# Patient Record
Sex: Female | Born: 1945
Health system: Southern US, Community
[De-identification: ages and names within clinical notes are randomized; demographics above are authoritative.]

## PROBLEM LIST (undated history)

## (undated) DIAGNOSIS — E119 Type 2 diabetes mellitus without complications: Secondary | ICD-10-CM

## (undated) DIAGNOSIS — K219 Gastro-esophageal reflux disease without esophagitis: Secondary | ICD-10-CM

## (undated) DIAGNOSIS — I10 Essential (primary) hypertension: Secondary | ICD-10-CM

## (undated) DIAGNOSIS — I5189 Other ill-defined heart diseases: Secondary | ICD-10-CM

## (undated) DIAGNOSIS — M1990 Unspecified osteoarthritis, unspecified site: Secondary | ICD-10-CM

## (undated) DIAGNOSIS — T753XXA Motion sickness, initial encounter: Secondary | ICD-10-CM

## (undated) DIAGNOSIS — R600 Localized edema: Secondary | ICD-10-CM

## (undated) DIAGNOSIS — IMO0001 Reserved for inherently not codable concepts without codable children: Secondary | ICD-10-CM

## (undated) DIAGNOSIS — R42 Dizziness and giddiness: Secondary | ICD-10-CM

## (undated) DIAGNOSIS — I2510 Atherosclerotic heart disease of native coronary artery without angina pectoris: Secondary | ICD-10-CM

## (undated) DIAGNOSIS — J45909 Unspecified asthma, uncomplicated: Secondary | ICD-10-CM

## (undated) DIAGNOSIS — F329 Major depressive disorder, single episode, unspecified: Secondary | ICD-10-CM

## (undated) DIAGNOSIS — J3089 Other allergic rhinitis: Secondary | ICD-10-CM

## (undated) DIAGNOSIS — E785 Hyperlipidemia, unspecified: Secondary | ICD-10-CM

## (undated) DIAGNOSIS — F419 Anxiety disorder, unspecified: Secondary | ICD-10-CM

## (undated) DIAGNOSIS — M419 Scoliosis, unspecified: Secondary | ICD-10-CM

## (undated) DIAGNOSIS — K559 Vascular disorder of intestine, unspecified: Secondary | ICD-10-CM

## (undated) DIAGNOSIS — E039 Hypothyroidism, unspecified: Secondary | ICD-10-CM

## (undated) DIAGNOSIS — F32A Depression, unspecified: Secondary | ICD-10-CM

## (undated) DIAGNOSIS — Z8719 Personal history of other diseases of the digestive system: Secondary | ICD-10-CM

## (undated) HISTORY — PX: OTHER SURGICAL HISTORY: SHX169

## (undated) HISTORY — DX: Other ill-defined heart diseases: I51.89

## (undated) HISTORY — PX: TONSILLECTOMY: SUR1361

## (undated) HISTORY — PX: APPENDECTOMY: SHX54

## (undated) HISTORY — PX: FRACTURE SURGERY: SHX138

## (undated) HISTORY — PX: TUBAL LIGATION: SHX77

## (undated) HISTORY — PX: CARDIAC CATHETERIZATION: SHX172

## (undated) HISTORY — DX: Vascular disorder of intestine, unspecified: K55.9

## (undated) HISTORY — PX: CORONARY ARTERY BYPASS GRAFT: SHX141

## (undated) HISTORY — PX: ABDOMINAL HYSTERECTOMY: SHX81

---

## 1985-03-23 HISTORY — PX: HERNIA REPAIR: SHX51

## 2003-03-24 HISTORY — PX: CHOLECYSTECTOMY: SHX55

## 2004-04-30 ENCOUNTER — Ambulatory Visit: Payer: Self-pay | Admitting: Unknown Physician Specialty

## 2004-07-03 ENCOUNTER — Ambulatory Visit: Payer: Self-pay | Admitting: Surgery

## 2004-07-21 ENCOUNTER — Ambulatory Visit: Payer: Self-pay | Admitting: Unknown Physician Specialty

## 2006-03-23 HISTORY — PX: CORONARY ARTERY BYPASS GRAFT: SHX141

## 2006-06-04 ENCOUNTER — Ambulatory Visit: Payer: Self-pay | Admitting: Internal Medicine

## 2006-06-15 ENCOUNTER — Ambulatory Visit: Payer: Self-pay | Admitting: Unknown Physician Specialty

## 2006-09-08 ENCOUNTER — Encounter: Payer: Self-pay | Admitting: Internal Medicine

## 2006-09-21 ENCOUNTER — Encounter: Payer: Self-pay | Admitting: Internal Medicine

## 2006-10-22 ENCOUNTER — Encounter: Payer: Self-pay | Admitting: Internal Medicine

## 2006-11-22 ENCOUNTER — Encounter: Payer: Self-pay | Admitting: Internal Medicine

## 2006-12-20 ENCOUNTER — Ambulatory Visit: Payer: Self-pay | Admitting: Gastroenterology

## 2006-12-22 ENCOUNTER — Encounter: Payer: Self-pay | Admitting: Internal Medicine

## 2007-06-20 ENCOUNTER — Ambulatory Visit: Payer: Self-pay | Admitting: Unknown Physician Specialty

## 2008-06-20 ENCOUNTER — Ambulatory Visit: Payer: Self-pay | Admitting: Unknown Physician Specialty

## 2008-12-31 ENCOUNTER — Ambulatory Visit: Payer: Self-pay | Admitting: Unknown Physician Specialty

## 2009-01-15 ENCOUNTER — Ambulatory Visit: Payer: Self-pay | Admitting: Unknown Physician Specialty

## 2009-06-21 ENCOUNTER — Ambulatory Visit: Payer: Self-pay | Admitting: Unknown Physician Specialty

## 2010-03-28 ENCOUNTER — Ambulatory Visit: Payer: Self-pay

## 2010-06-24 ENCOUNTER — Ambulatory Visit: Payer: Self-pay | Admitting: Unknown Physician Specialty

## 2010-10-06 ENCOUNTER — Ambulatory Visit: Payer: Self-pay | Admitting: Gastroenterology

## 2010-10-07 LAB — PATHOLOGY REPORT

## 2011-03-24 HISTORY — PX: ROTATOR CUFF REPAIR: SHX139

## 2011-07-16 ENCOUNTER — Ambulatory Visit: Payer: Self-pay | Admitting: Unknown Physician Specialty

## 2011-07-30 ENCOUNTER — Ambulatory Visit: Payer: Self-pay | Admitting: Orthopedic Surgery

## 2011-08-12 ENCOUNTER — Ambulatory Visit: Payer: Self-pay | Admitting: Unknown Physician Specialty

## 2012-01-01 ENCOUNTER — Emergency Department: Payer: Self-pay | Admitting: Emergency Medicine

## 2012-01-22 ENCOUNTER — Ambulatory Visit: Payer: Self-pay | Admitting: Specialist

## 2012-01-28 ENCOUNTER — Ambulatory Visit: Payer: Self-pay | Admitting: Specialist

## 2012-01-28 LAB — CBC WITH DIFFERENTIAL/PLATELET
Basophil #: 0.1 10*3/uL (ref 0.0–0.1)
Basophil %: 0.8 %
Eosinophil #: 0.6 10*3/uL (ref 0.0–0.7)
Eosinophil %: 4.3 %
HCT: 38.4 % (ref 35.0–47.0)
HGB: 13.2 g/dL (ref 12.0–16.0)
Lymphocyte #: 3.9 10*3/uL — ABNORMAL HIGH (ref 1.0–3.6)
Lymphocyte %: 30.3 %
MCH: 31 pg (ref 26.0–34.0)
MCHC: 34.4 g/dL (ref 32.0–36.0)
MCV: 90 fL (ref 80–100)
Monocyte #: 0.6 x10 3/mm (ref 0.2–0.9)
Monocyte %: 5 %
Neutrophil #: 7.7 10*3/uL — ABNORMAL HIGH (ref 1.4–6.5)
Neutrophil %: 59.6 %
Platelet: 324 10*3/uL (ref 150–440)
RBC: 4.26 10*6/uL (ref 3.80–5.20)
RDW: 13.7 % (ref 11.5–14.5)
WBC: 12.9 10*3/uL — ABNORMAL HIGH (ref 3.6–11.0)

## 2012-01-28 LAB — BASIC METABOLIC PANEL
Anion Gap: 9 (ref 7–16)
BUN: 18 mg/dL (ref 7–18)
Calcium, Total: 9.8 mg/dL (ref 8.5–10.1)
Chloride: 102 mmol/L (ref 98–107)
Co2: 27 mmol/L (ref 21–32)
Creatinine: 0.81 mg/dL (ref 0.60–1.30)
EGFR (African American): >60
EGFR (Non-African Amer.): >60
Glucose: 74 mg/dL (ref 65–99)
Osmolality: 276 (ref 275–301)
Potassium: 4.8 mmol/L (ref 3.5–5.1)
Sodium: 138 mmol/L (ref 136–145)

## 2012-02-05 ENCOUNTER — Ambulatory Visit: Payer: Self-pay | Admitting: Specialist

## 2012-03-23 HISTORY — PX: BACK SURGERY: SHX140

## 2012-04-25 ENCOUNTER — Ambulatory Visit: Payer: Self-pay | Admitting: Orthopedic Surgery

## 2012-05-19 ENCOUNTER — Ambulatory Visit: Payer: Self-pay | Admitting: Internal Medicine

## 2012-06-03 ENCOUNTER — Ambulatory Visit: Payer: Self-pay | Admitting: Internal Medicine

## 2012-06-24 ENCOUNTER — Ambulatory Visit: Payer: Self-pay | Admitting: Unknown Physician Specialty

## 2012-06-29 DIAGNOSIS — M549 Dorsalgia, unspecified: Secondary | ICD-10-CM | POA: Insufficient documentation

## 2012-06-29 DIAGNOSIS — M419 Scoliosis, unspecified: Secondary | ICD-10-CM | POA: Insufficient documentation

## 2012-06-30 ENCOUNTER — Ambulatory Visit: Payer: Self-pay | Admitting: Unknown Physician Specialty

## 2012-08-01 ENCOUNTER — Emergency Department: Payer: Self-pay | Admitting: Emergency Medicine

## 2012-08-01 LAB — COMPREHENSIVE METABOLIC PANEL
Albumin: 3.5 g/dL (ref 3.4–5.0)
Alkaline Phosphatase: 117 U/L (ref 50–136)
Anion Gap: 3 — ABNORMAL LOW (ref 7–16)
BUN: 14 mg/dL (ref 7–18)
Bilirubin,Total: 0.2 mg/dL (ref 0.2–1.0)
Calcium, Total: 9.6 mg/dL (ref 8.5–10.1)
Chloride: 102 mmol/L (ref 98–107)
Co2: 29 mmol/L (ref 21–32)
Creatinine: 0.77 mg/dL (ref 0.60–1.30)
EGFR (African American): >60
EGFR (Non-African Amer.): >60
Glucose: 146 mg/dL — ABNORMAL HIGH (ref 65–99)
Osmolality: 271 (ref 275–301)
Potassium: 4.9 mmol/L (ref 3.5–5.1)
SGOT(AST): 24 U/L (ref 15–37)
SGPT (ALT): 25 U/L (ref 12–78)
Sodium: 134 mmol/L — ABNORMAL LOW (ref 136–145)
Total Protein: 7.6 g/dL (ref 6.4–8.2)

## 2012-08-01 LAB — CBC
HCT: 35.1 % (ref 35.0–47.0)
HGB: 11.8 g/dL — ABNORMAL LOW (ref 12.0–16.0)
MCH: 29.9 pg (ref 26.0–34.0)
MCHC: 33.7 g/dL (ref 32.0–36.0)
MCV: 89 fL (ref 80–100)
Platelet: 587 10*3/uL — ABNORMAL HIGH (ref 150–440)
RBC: 3.95 10*6/uL (ref 3.80–5.20)
RDW: 13.6 % (ref 11.5–14.5)
WBC: 13.7 10*3/uL — ABNORMAL HIGH (ref 3.6–11.0)

## 2012-08-01 LAB — URINALYSIS, COMPLETE
Bilirubin,UR: NEGATIVE
Blood: NEGATIVE
Glucose,UR: NEGATIVE mg/dL (ref 0–75)
Hyaline Cast: 3
Nitrite: NEGATIVE
Ph: 5 (ref 4.5–8.0)
Protein: NEGATIVE
RBC,UR: 1 /HPF (ref 0–5)
Specific Gravity: 1.02 (ref 1.003–1.030)
Squamous Epithelial: 1
WBC UR: 3 /HPF (ref 0–5)

## 2012-08-01 LAB — LIPASE, BLOOD: Lipase: 137 U/L (ref 73–393)

## 2012-08-23 DIAGNOSIS — R3911 Hesitancy of micturition: Secondary | ICD-10-CM | POA: Insufficient documentation

## 2012-08-23 DIAGNOSIS — R339 Retention of urine, unspecified: Secondary | ICD-10-CM | POA: Insufficient documentation

## 2012-12-13 ENCOUNTER — Ambulatory Visit: Payer: Self-pay | Admitting: Internal Medicine

## 2013-01-23 IMAGING — MG MAM DGTL SCREENING MAMMO W/CAD
1 series · 4 of 4 positions shown · non-contrast
Comparison: none

REASON FOR EXAM: screening
COMMENTS:  Submitted by practice: [facility] [HOSPITAL] Scheduled by
user: [email or web address]

[Series 6994: R CC · right · 4 of 4 slices shown]
[im 1/4]
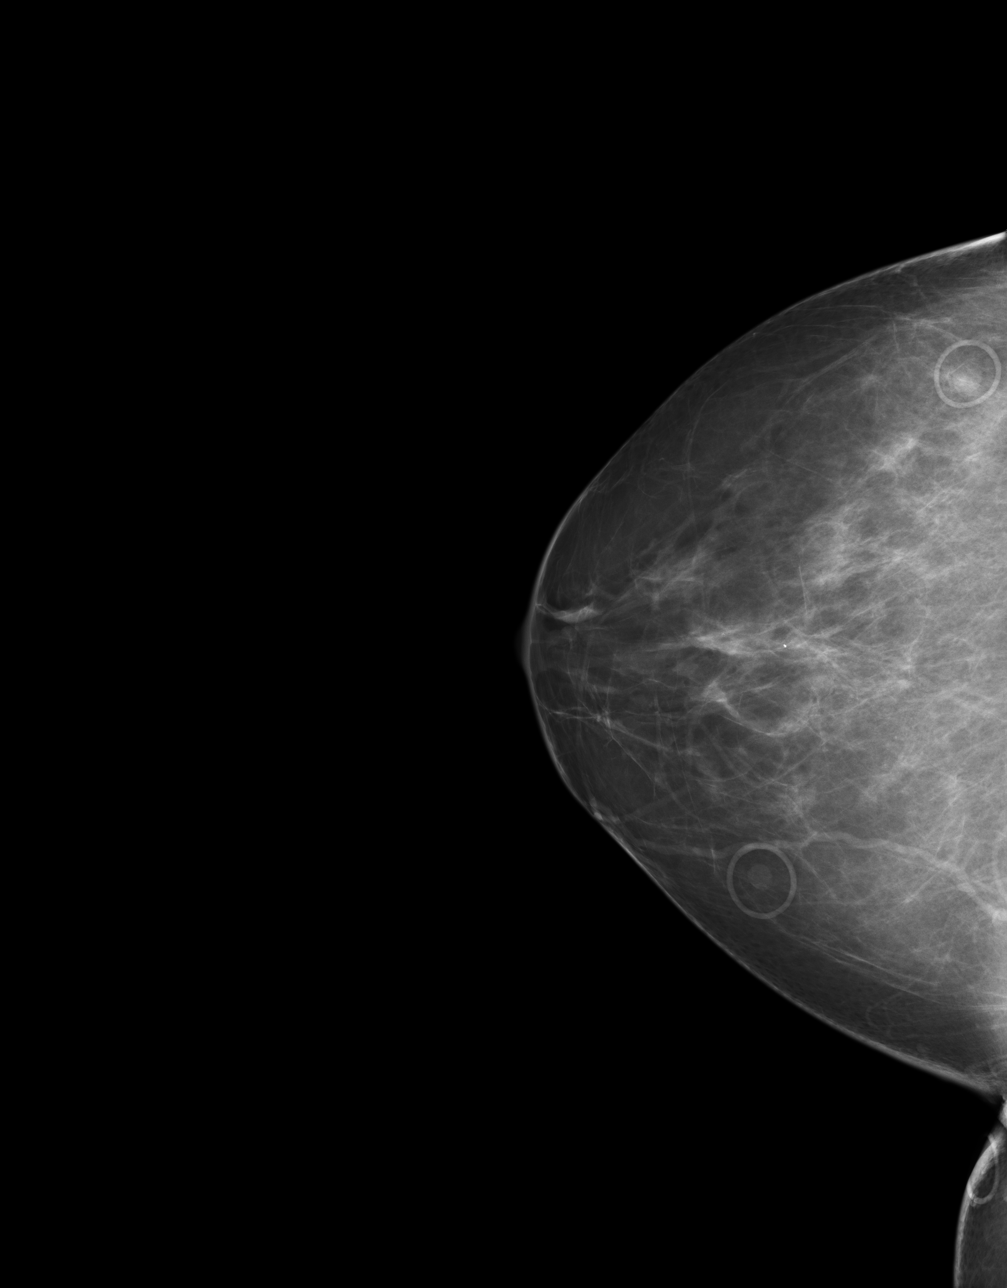
[im 2/4]
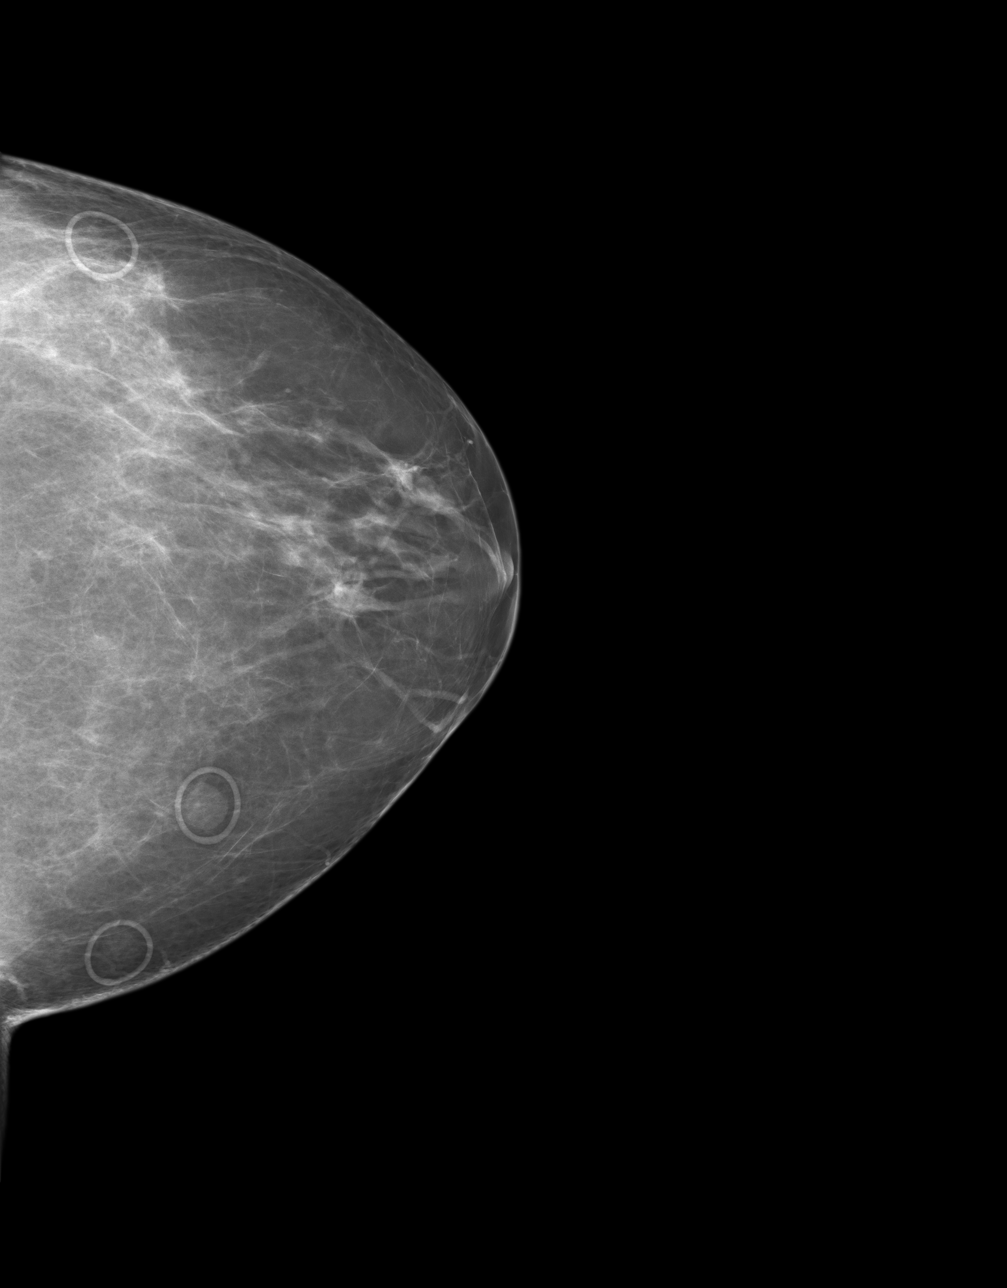
[im 3/4]
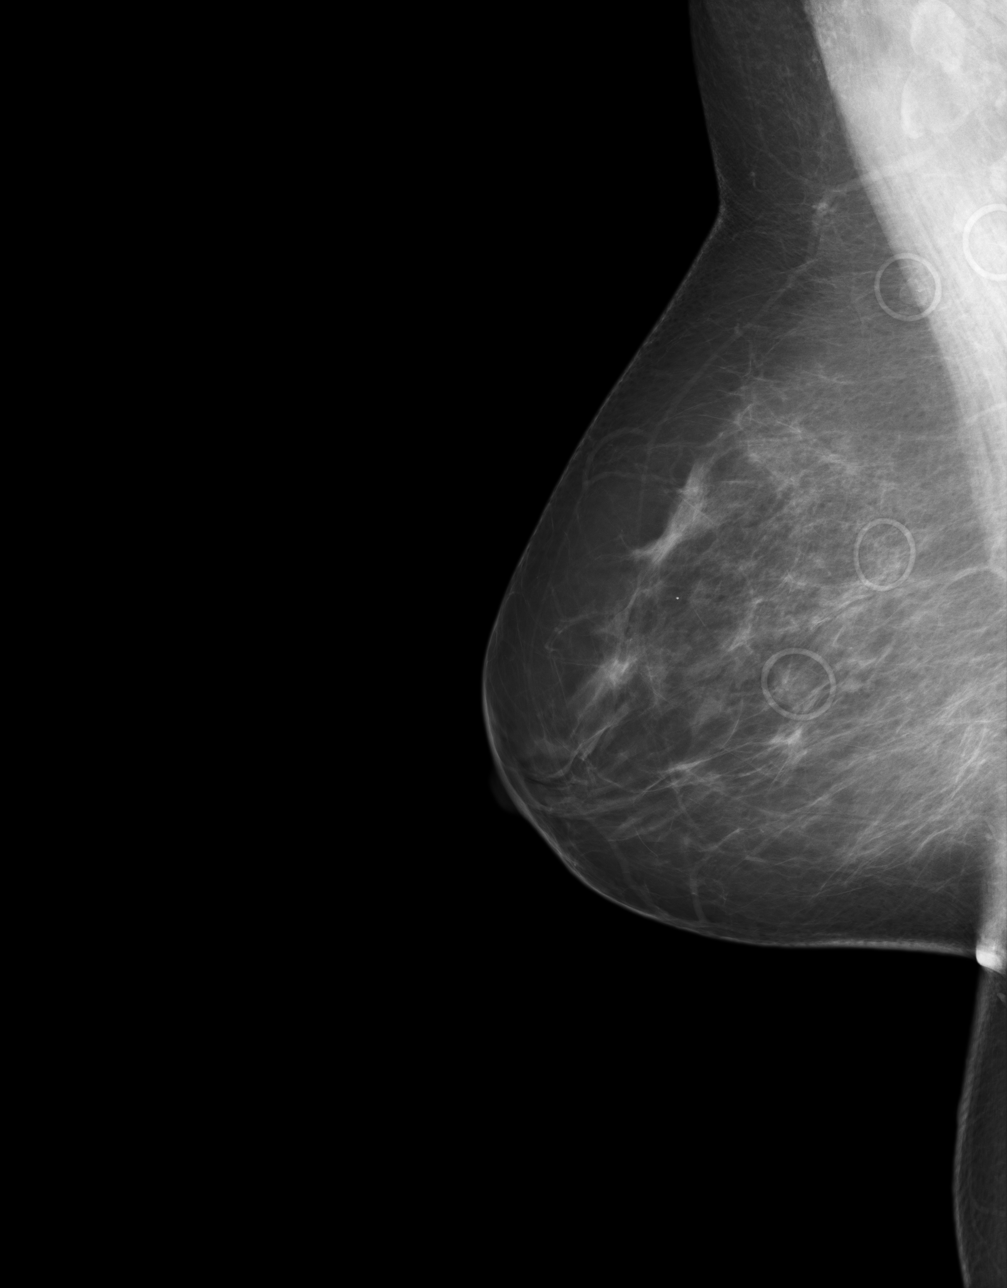
[im 4/4]
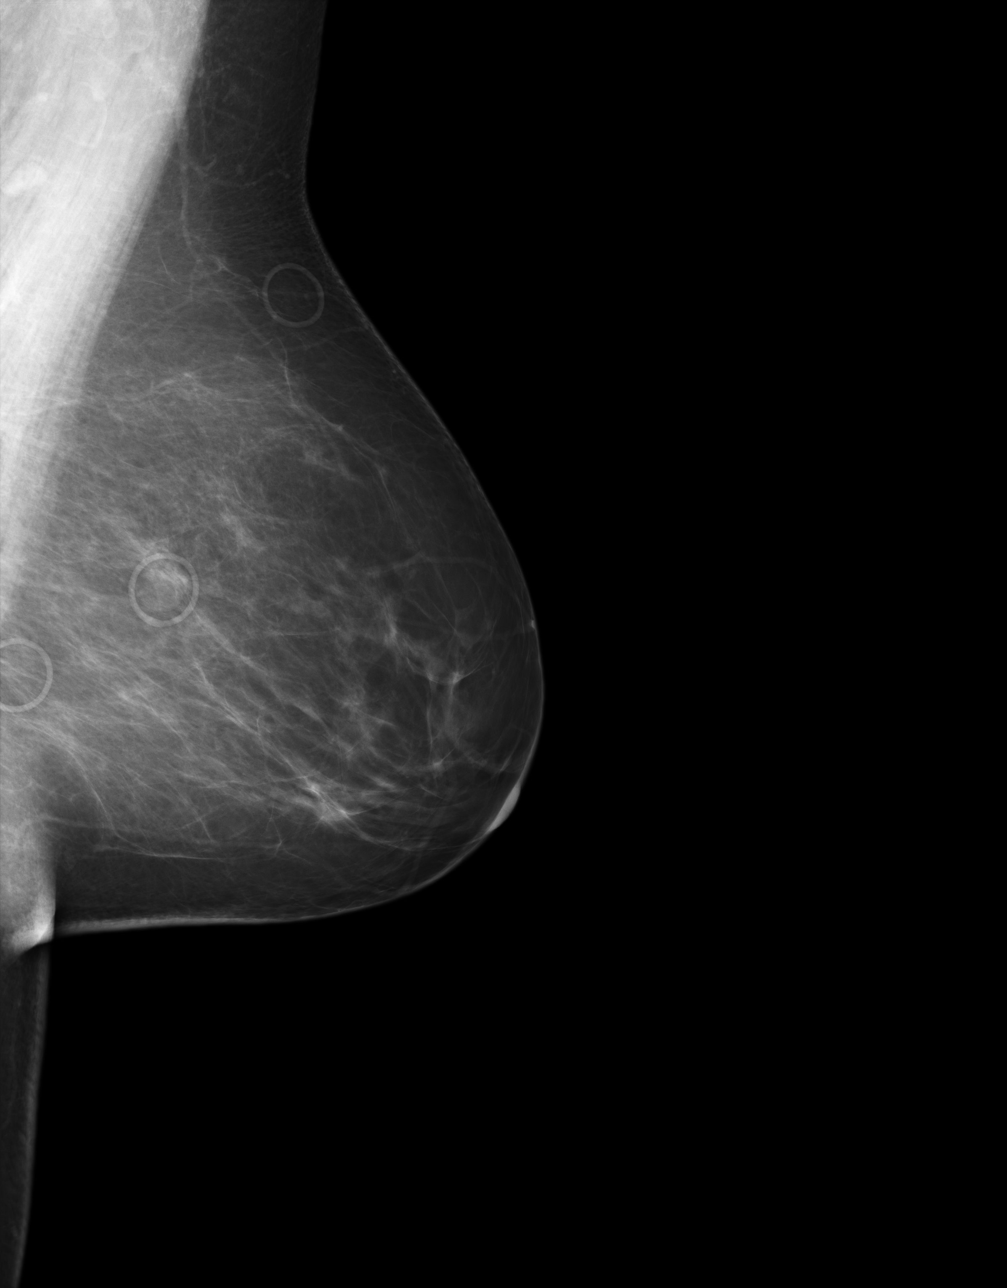

[4 of 4 positions shown; findings below may reference images not displayed]

PROCEDURE:     MAM - MAM DGTL SCREENING MAMMO W/CAD  - August 12, 2011  [DATE]

RESULT:     Comparison is made to a previous digital study June 24, 2010
as well as June 21, 2009 and February 01, 2002.

The breasts exhibit a scattered moderately dense parenchymal pattern.
Numerous skin nevi were marked bilaterally. Nodularity in the anterior
aspect of the left breast inferiorly is stable. I see no malignant appearing
grouping of microcalcification and no area of new architectural distortion.
There is a stable coarse microcalcification just above the equator of the
right breast centrally. There are benign-appearing lymph nodes in the
axillary regions.
IMPRESSION: I see no finding suspicious for malignancy.

BI-RADS 2 benign findings.

Recommendation: Please continue to encourage yearly mammographic followup.

A NEGATIVE MAMMOGRAM REPORT DOES NOT PRECLUDE BIOPSY OR OTHER EVALUATION OF
A CLINICALLY PALPABLE OR OTHERWISE SUSPICIOUS MASS OR LESION. BREAST CANCER
MAY NOT BE DETECTED BY MAMMOGRAPHY IN UP TO 10% OF CASES.

## 2013-10-25 DIAGNOSIS — G629 Polyneuropathy, unspecified: Secondary | ICD-10-CM | POA: Insufficient documentation

## 2013-10-25 DIAGNOSIS — G4700 Insomnia, unspecified: Secondary | ICD-10-CM | POA: Insufficient documentation

## 2014-02-24 ENCOUNTER — Emergency Department: Payer: Self-pay | Admitting: Internal Medicine

## 2014-06-04 DIAGNOSIS — G4733 Obstructive sleep apnea (adult) (pediatric): Secondary | ICD-10-CM

## 2014-06-04 HISTORY — DX: Obstructive sleep apnea (adult) (pediatric): G47.33

## 2014-06-06 ENCOUNTER — Ambulatory Visit: Payer: Self-pay | Admitting: Orthopedic Surgery

## 2014-07-10 NOTE — Op Note (Signed)
PATIENT NAME:  Alexandria Marquez, Alexandria Marquez MR#:  [id number] DATE OF BIRTH:  January 16, 1946  DATE OF PROCEDURE:  02/05/2012  PREOPERATIVE DIAGNOSES:  1. Large tear left rotator cuff following anterior shoulder dislocation. 2. Arthritis of the left AC joint.  3. Impingement syndrome, left shoulder.  4. Fraying of the biceps tendon.   POSTOPERATIVE DIAGNOSES: 1. Large tear left rotator cuff following anterior shoulder dislocation. 2. Arthritis of the left AC joint.  3. Impingement syndrome, left shoulder.  4. Fraying of the biceps tendon.   OPERATION:  1. Arthroscopic left shoulder rotator cuff repair.  2. Arthroscopic excision of the left distal clavicle.  3. Arthroscopic subacromial decompression with partial acromioplasty.  4. Arthroscopic debridement of the biceps tendon.   SURGEON: Valinda Hoar, MD    ANESTHESIA: General endotracheal plus left interscalene block.   COMPLICATIONS: None.   DRAINS: None.   ESTIMATED BLOOD LOSS: Minimal.   REPLACEMENTS: None.   OPERATIVE FINDINGS: The patient had a very large tear of the rotator cuff with a fully exposed humeral head. This was retracted in a U-shape fashion. The biceps tendon was exposed. There was significant anterior spurring off the acromion and narrowing beneath the AC joint from osteophytes. The glenohumeral joint showed intact articular surfaces essentially. The biceps tendon was frayed. The labrum was intact. There were no loose bodies. There was moderate synovitis.   OPERATIVE PROCEDURE: The patient was brought to the operating room where she underwent satisfactory general endotracheal anesthesia after a left interscalene block had been instilled. She was turned into the right lateral decubitus position and padded appropriately on the beanbag. The left arm was prepped and draped in a sterile fashion and placed in 10 pounds of traction. The arthroscopy was carried out from a posterior portal with accessory portals laterally and anteriorly.  The subacromial space was examined first. Upon entering the space, a large tear of the cuff was visualized. Most of the head of the humerus was exposed. The lateral portal was made and an arthroscopic motorized resector was introduced to remove extraneous soft tissue. ArthroCare wand was also used to remove bursal tissue and synovitis. The [facility] joint was exposed. The arthroscope was reintroduced back into the glenohumeral joint through an anterior portal. The motorized resector was used to trim up the biceps tendon. It was not frayed badly enough for me to want to deem it necessary to release it. I felt it was better to keep this for stability given her recent dislocation. The labrum was debrided. The arthroscope was then reintroduced into the subacromial space and the edges of the rotator cuff were freshened up. Three #2 Magnum wire free sutures were placed in the apex of the tear coming sequentially more laterally to provide a convergence of the anterior and posterior portions of the tendon. Two more #2 Magnum wires were introduced anteriorly and posteriorly in the cuff and two 6.5 mm speed screws were introduced into the lateral portion of the tuberosity. These were tightened first anteriorly and posteriorly which brought the rest of the cuff the rest of the way over to the footprint of the tuberosity. The articular surface was completely covered. One additional free suture was placed from anterior to posterior to tie the cuff down. Although this was tedious, it progressed nicely and we had excellent repair of the tendon to bone. The distal clavicle was then removed through an anterior portal with a large bur. The wound was thoroughly irrigated. Arm was rotated and there was good coverage of the  head and good stability. The arthroscope was removed and the stab wounds closed with 3-0 nylon suture. 0.25% Marcaine with epinephrine was placed in the joint and the bursa. Dry sterile dressings and TENS pads were applied.  The traction was removed and a large sling applied. The patient was awakened and taken to recovery in good condition.   ____________________________ Valinda HoarHoward E. Ashleyanne Hemmingway, MD hem:drc D: 02/05/2012 11:10:12 ET T: 02/05/2012 11:20:53 ET JOB#: [id number]  cc: Valinda HoarHoward E. Dawson Albers, MD, <Dictator> Valinda HoarHOWARD E Coleby Yett MD ELECTRONICALLY SIGNED 02/08/2012 15:46

## 2014-07-25 DIAGNOSIS — I340 Nonrheumatic mitral (valve) insufficiency: Secondary | ICD-10-CM | POA: Insufficient documentation

## 2014-08-21 ENCOUNTER — Ambulatory Visit: Payer: Medicare Other | Attending: Internal Medicine

## 2014-08-21 DIAGNOSIS — G4761 Periodic limb movement disorder: Secondary | ICD-10-CM | POA: Insufficient documentation

## 2014-08-21 DIAGNOSIS — G4700 Insomnia, unspecified: Secondary | ICD-10-CM | POA: Diagnosis present

## 2014-09-25 ENCOUNTER — Other Ambulatory Visit: Payer: Self-pay | Admitting: Physical Medicine and Rehabilitation

## 2014-09-25 DIAGNOSIS — M5416 Radiculopathy, lumbar region: Secondary | ICD-10-CM

## 2014-10-01 ENCOUNTER — Ambulatory Visit
Admission: RE | Admit: 2014-10-01 | Discharge: 2014-10-01 | Disposition: A | Discharge status: Home / Self Care | Payer: Medicare Other | Source: Ambulatory Visit | Attending: Physical Medicine and Rehabilitation | Admitting: Physical Medicine and Rehabilitation

## 2014-10-01 DIAGNOSIS — M5416 Radiculopathy, lumbar region: Secondary | ICD-10-CM | POA: Diagnosis present

## 2014-10-01 DIAGNOSIS — M2548 Effusion, other site: Secondary | ICD-10-CM | POA: Diagnosis not present

## 2014-10-01 DIAGNOSIS — M47896 Other spondylosis, lumbar region: Secondary | ICD-10-CM | POA: Diagnosis not present

## 2014-10-01 DIAGNOSIS — M47897 Other spondylosis, lumbosacral region: Secondary | ICD-10-CM | POA: Diagnosis not present

## 2014-11-09 DIAGNOSIS — M5136 Other intervertebral disc degeneration, lumbar region: Secondary | ICD-10-CM | POA: Insufficient documentation

## 2014-11-09 DIAGNOSIS — M51369 Other intervertebral disc degeneration, lumbar region without mention of lumbar back pain or lower extremity pain: Secondary | ICD-10-CM | POA: Insufficient documentation

## 2014-11-09 DIAGNOSIS — M5416 Radiculopathy, lumbar region: Secondary | ICD-10-CM | POA: Insufficient documentation

## 2015-02-19 DIAGNOSIS — M159 Polyosteoarthritis, unspecified: Secondary | ICD-10-CM | POA: Insufficient documentation

## 2015-02-19 DIAGNOSIS — F325 Major depressive disorder, single episode, in full remission: Secondary | ICD-10-CM | POA: Insufficient documentation

## 2015-02-19 DIAGNOSIS — M8949 Other hypertrophic osteoarthropathy, multiple sites: Secondary | ICD-10-CM | POA: Insufficient documentation

## 2015-03-24 HISTORY — PX: JOINT REPLACEMENT: SHX530

## 2015-10-02 DIAGNOSIS — R0602 Shortness of breath: Secondary | ICD-10-CM | POA: Insufficient documentation

## 2015-10-24 ENCOUNTER — Other Ambulatory Visit: Payer: Self-pay | Admitting: Specialist

## 2015-10-24 DIAGNOSIS — R0602 Shortness of breath: Secondary | ICD-10-CM

## 2015-10-31 ENCOUNTER — Ambulatory Visit
Admission: RE | Admit: 2015-10-31 | Discharge: 2015-10-31 | Disposition: A | Discharge status: Home / Self Care | Payer: Medicare Other | Source: Ambulatory Visit | Attending: Specialist | Admitting: Specialist

## 2015-10-31 DIAGNOSIS — R0602 Shortness of breath: Secondary | ICD-10-CM | POA: Diagnosis not present

## 2015-12-04 ENCOUNTER — Encounter
Admission: RE | Admit: 2015-12-04 | Discharge: 2015-12-04 | Disposition: A | Discharge status: Home / Self Care | Payer: Medicare Other | Source: Ambulatory Visit | Attending: Surgery | Admitting: Surgery

## 2015-12-04 DIAGNOSIS — E039 Hypothyroidism, unspecified: Secondary | ICD-10-CM | POA: Insufficient documentation

## 2015-12-04 DIAGNOSIS — F419 Anxiety disorder, unspecified: Secondary | ICD-10-CM | POA: Insufficient documentation

## 2015-12-04 DIAGNOSIS — Z01818 Encounter for other preprocedural examination: Secondary | ICD-10-CM

## 2015-12-04 DIAGNOSIS — I10 Essential (primary) hypertension: Secondary | ICD-10-CM | POA: Insufficient documentation

## 2015-12-04 DIAGNOSIS — I2510 Atherosclerotic heart disease of native coronary artery without angina pectoris: Secondary | ICD-10-CM | POA: Insufficient documentation

## 2015-12-04 DIAGNOSIS — Z01810 Encounter for preprocedural cardiovascular examination: Secondary | ICD-10-CM | POA: Diagnosis not present

## 2015-12-04 DIAGNOSIS — E119 Type 2 diabetes mellitus without complications: Secondary | ICD-10-CM | POA: Insufficient documentation

## 2015-12-04 DIAGNOSIS — Z01812 Encounter for preprocedural laboratory examination: Secondary | ICD-10-CM | POA: Diagnosis present

## 2015-12-04 DIAGNOSIS — K219 Gastro-esophageal reflux disease without esophagitis: Secondary | ICD-10-CM | POA: Diagnosis not present

## 2015-12-04 HISTORY — DX: Unspecified asthma, uncomplicated: J45.909

## 2015-12-04 HISTORY — DX: Atherosclerotic heart disease of native coronary artery without angina pectoris: I25.10

## 2015-12-04 HISTORY — DX: Unspecified osteoarthritis, unspecified site: M19.90

## 2015-12-04 HISTORY — DX: Reserved for inherently not codable concepts without codable children: IMO0001

## 2015-12-04 HISTORY — DX: Depression, unspecified: F32.A

## 2015-12-04 HISTORY — DX: Scoliosis, unspecified: M41.9

## 2015-12-04 HISTORY — DX: Anxiety disorder, unspecified: F41.9

## 2015-12-04 HISTORY — DX: Hypothyroidism, unspecified: E03.9

## 2015-12-04 HISTORY — DX: Personal history of other diseases of the digestive system: Z87.19

## 2015-12-04 HISTORY — DX: Motion sickness, initial encounter: T75.3XXA

## 2015-12-04 HISTORY — DX: Major depressive disorder, single episode, unspecified: F32.9

## 2015-12-04 HISTORY — DX: Type 2 diabetes mellitus without complications: E11.9

## 2015-12-04 HISTORY — DX: Essential (primary) hypertension: I10

## 2015-12-04 HISTORY — DX: Gastro-esophageal reflux disease without esophagitis: K21.9

## 2015-12-04 LAB — URINALYSIS COMPLETE WITH MICROSCOPIC (ARMC ONLY)
Bacteria, UA: NONE SEEN
Bilirubin Urine: NEGATIVE
Glucose, UA: >500 mg/dL — AB
Hgb urine dipstick: NEGATIVE
Ketones, ur: NEGATIVE mg/dL
Nitrite: NEGATIVE
Protein, ur: NEGATIVE mg/dL
Specific Gravity, Urine: 1.02 (ref 1.005–1.030)
pH: 5 (ref 5.0–8.0)

## 2015-12-04 LAB — TYPE AND SCREEN
ABO/RH(D): A NEG
Antibody Screen: NEGATIVE

## 2015-12-04 LAB — BASIC METABOLIC PANEL
Anion gap: 8 (ref 5–15)
BUN: 18 mg/dL (ref 6–20)
CO2: 24 mmol/L (ref 22–32)
Calcium: 9.6 mg/dL (ref 8.9–10.3)
Chloride: 104 mmol/L (ref 101–111)
Creatinine, Ser: 0.95 mg/dL (ref 0.44–1.00)
GFR calc Af Amer: >60 mL/min (ref >60)
GFR calc non Af Amer: 59 mL/min — ABNORMAL LOW (ref >60)
Glucose, Bld: 340 mg/dL — ABNORMAL HIGH (ref 65–99)
Potassium: 4.5 mmol/L (ref 3.5–5.1)
Sodium: 136 mmol/L (ref 135–145)

## 2015-12-04 LAB — CBC
HCT: 39.9 % (ref 35.0–47.0)
Hemoglobin: 14 g/dL (ref 12.0–16.0)
MCH: 30.9 pg (ref 26.0–34.0)
MCHC: 35 g/dL (ref 32.0–36.0)
MCV: 88.3 fL (ref 80.0–100.0)
Platelets: 270 10*3/uL (ref 150–440)
RBC: 4.52 MIL/uL (ref 3.80–5.20)
RDW: 13.3 % (ref 11.5–14.5)
WBC: 9.8 10*3/uL (ref 3.6–11.0)

## 2015-12-04 LAB — PROTIME-INR
INR: 0.99
Prothrombin Time: 13.1 seconds (ref 11.4–15.2)

## 2015-12-04 LAB — SURGICAL PCR SCREEN
MRSA, PCR: NEGATIVE
Staphylococcus aureus: NEGATIVE

## 2015-12-04 NOTE — Patient Instructions (Signed)
  Your procedure is scheduled on: 12/19/15 Report to Day Surgery. MEDICAL MALL SECOND Johnnette LitterFLOORTo find out your arrival time please call [phone] between 1PM - 3PM on 12/18/15.  Remember: Instructions that are not followed completely may result in serious medical risk, up to and including death, or upon the discretion of your surgeon and anesthesiologist your surgery may need to be rescheduled.    _X___ 1. Do not eat food or drink liquids after midnight. No gum chewing or hard candies.     _X___ 2. No Alcohol for 24 hours before or after surgery.   ____ 3. Do Not Smoke For 24 Hours Prior to Your Surgery.   ____ 4. Bring all medications with you on the day of surgery if instructed.    __X__ 5. Notify your doctor if there is any change in your medical condition     (cold, fever, infections).       Do not wear jewelry, make-up, hairpins, clips or nail polish.  Do not wear lotions, powders, or perfumes. You may wear deodorant.  Do not shave 48 hours prior to surgery. Men may shave face and neck.  Do not bring valuables to the hospital.    [facility] for any belongings or valuables.               Contacts, dentures or bridgework may not be worn into surgery.  Leave your suitcase in the car. After surgery it may be brought to your room.  For patients admitted to the hospital, discharge time is determined by your                treatment team.   Patients discharged the day of surgery will not be allowed to drive home.   Please read over the following fact sheets that you were given:   Surgical Site Infection Prevention /MRSA  __X__ Take these medicines the morning of surgery with A SIP OF WATER:    1.ENALAPRIL  2. LEVOTHYROXINE  3. PANTOPRAZOLE  4.  5.  6.  ____ Fleet Enema (as directed)   _X___ Use CHG Soap as directed  ____ Use inhalers on the day of surgery  __X_ Stop metformin 2 days prior to surgery    ____ Take 1/2 of usual insulin dose the night  before surgery and none on the morning of surgery.   ___X_ Stop Coumadin/Plavix/aspirin on   STOP 1 WEEK PREOP  ___X_ Stop Anti-inflammatories on   STOP MELOXICAM  NOW   ____ Stop supplements until after surgery.    ____ Bring C-Pap to the hospital.

## 2015-12-05 NOTE — Pre-Procedure Instructions (Addendum)
REQUEST FOR MEDICAL CLEARANCE/ LABS WITH BS 340 AND URINE GLUCOSE >500 CALLED AND FAXED TO [facility] AT DR J SINGH'S OFFICE. ALSO LM FOR TIFFANY AT DR POGGI'S. INSTRUCTED BY DR A PENWARDEN. PATIENT ALSO NOTIFIED

## 2015-12-16 NOTE — Pre-Procedure Instructions (Signed)
[facility]IS[facility]. ACTOS INCREASED TO 30 MG /DAY.

## 2015-12-19 ENCOUNTER — Inpatient Hospital Stay: Payer: Medicare Other | Admitting: Certified Registered"

## 2015-12-19 ENCOUNTER — Inpatient Hospital Stay
Admission: RE | Admit: 2015-12-19 | Discharge: 2015-12-22 | DRG: 470 | Disposition: A | Discharge status: Skilled Nursing Facility | Payer: Medicare Other | Source: Ambulatory Visit | Attending: Surgery | Admitting: Surgery

## 2015-12-19 ENCOUNTER — Encounter: Payer: Self-pay | Admitting: *Deleted

## 2015-12-19 ENCOUNTER — Encounter
Admission: RE | Disposition: A | Discharge status: Skilled Nursing Facility | Payer: Self-pay | Source: Ambulatory Visit | Attending: Surgery

## 2015-12-19 ENCOUNTER — Inpatient Hospital Stay: Payer: Medicare Other

## 2015-12-19 DIAGNOSIS — Z7984 Long term (current) use of oral hypoglycemic drugs: Secondary | ICD-10-CM | POA: Diagnosis not present

## 2015-12-19 DIAGNOSIS — M1711 Unilateral primary osteoarthritis, right knee: Principal | ICD-10-CM | POA: Diagnosis present

## 2015-12-19 DIAGNOSIS — Z96651 Presence of right artificial knee joint: Secondary | ICD-10-CM

## 2015-12-19 DIAGNOSIS — E118 Type 2 diabetes mellitus with unspecified complications: Secondary | ICD-10-CM | POA: Diagnosis present

## 2015-12-19 DIAGNOSIS — Z981 Arthrodesis status: Secondary | ICD-10-CM | POA: Diagnosis not present

## 2015-12-19 DIAGNOSIS — Z7982 Long term (current) use of aspirin: Secondary | ICD-10-CM | POA: Diagnosis not present

## 2015-12-19 DIAGNOSIS — E039 Hypothyroidism, unspecified: Secondary | ICD-10-CM | POA: Diagnosis present

## 2015-12-19 DIAGNOSIS — Z7951 Long term (current) use of inhaled steroids: Secondary | ICD-10-CM | POA: Diagnosis not present

## 2015-12-19 DIAGNOSIS — Z79899 Other long term (current) drug therapy: Secondary | ICD-10-CM | POA: Diagnosis not present

## 2015-12-19 DIAGNOSIS — Z951 Presence of aortocoronary bypass graft: Secondary | ICD-10-CM

## 2015-12-19 DIAGNOSIS — K219 Gastro-esophageal reflux disease without esophagitis: Secondary | ICD-10-CM | POA: Diagnosis present

## 2015-12-19 DIAGNOSIS — M25561 Pain in right knee: Secondary | ICD-10-CM | POA: Diagnosis present

## 2015-12-19 DIAGNOSIS — F329 Major depressive disorder, single episode, unspecified: Secondary | ICD-10-CM | POA: Diagnosis present

## 2015-12-19 DIAGNOSIS — I2510 Atherosclerotic heart disease of native coronary artery without angina pectoris: Secondary | ICD-10-CM | POA: Diagnosis present

## 2015-12-19 DIAGNOSIS — I10 Essential (primary) hypertension: Secondary | ICD-10-CM | POA: Diagnosis present

## 2015-12-19 DIAGNOSIS — R2689 Other abnormalities of gait and mobility: Secondary | ICD-10-CM

## 2015-12-19 HISTORY — PX: TOTAL KNEE ARTHROPLASTY: SHX125

## 2015-12-19 LAB — GLUCOSE, CAPILLARY
Glucose-Capillary: 275 mg/dL — ABNORMAL HIGH (ref 65–99)
Glucose-Capillary: 178 mg/dL — ABNORMAL HIGH (ref 65–99)
Glucose-Capillary: 192 mg/dL — ABNORMAL HIGH (ref 65–99)
Glucose-Capillary: 215 mg/dL — ABNORMAL HIGH (ref 65–99)
Glucose-Capillary: 204 mg/dL — ABNORMAL HIGH (ref 65–99)

## 2015-12-19 LAB — TYPE AND SCREEN
ABO/RH(D): A NEG
Antibody Screen: NEGATIVE

## 2015-12-19 SURGERY — ARTHROPLASTY, KNEE, TOTAL
Anesthesia: Spinal | Site: Knee | Laterality: Right | Wound class: Clean

## 2015-12-19 MED ORDER — DOCUSATE SODIUM 100 MG PO CAPS
100.0000 mg | ORAL_CAPSULE | Freq: Two times a day (BID) | ORAL | Status: DC
  Administered 2015-12-19 – 2015-12-22 (×7): 100 mg via ORAL
  Filled 2015-12-19 (×6): qty 1

## 2015-12-19 MED ORDER — FLEET ENEMA 7-19 GM/118ML RE ENEM
1.0000 | ENEMA | Freq: Once | RECTAL | Status: AC | PRN
  Administered 2015-12-22: 1 via RECTAL

## 2015-12-19 MED ORDER — BISACODYL 10 MG RE SUPP
10.0000 mg | Freq: Every day | RECTAL | Status: DC | PRN
  Administered 2015-12-21: 10 mg via RECTAL
  Filled 2015-12-19: qty 1

## 2015-12-19 MED ORDER — DULOXETINE HCL 60 MG PO CPEP
60.0000 mg | ORAL_CAPSULE | Freq: Every day | ORAL | Status: DC
  Administered 2015-12-19 – 2015-12-22 (×4): 60 mg via ORAL
  Filled 2015-12-19 (×4): qty 1

## 2015-12-19 MED ORDER — BUPIVACAINE-EPINEPHRINE (PF) 0.5% -1:200000 IJ SOLN
INTRAMUSCULAR | Status: AC
  Filled 2015-12-19: qty 30

## 2015-12-19 MED ORDER — ONDANSETRON HCL 4 MG PO TABS: 4.0000 mg | ORAL_TABLET | Freq: Four times a day (QID) | ORAL | Status: DC | PRN

## 2015-12-19 MED ORDER — METOCLOPRAMIDE HCL 5 MG/ML IJ SOLN: 5.0000 mg | Freq: Three times a day (TID) | INTRAMUSCULAR | Status: DC | PRN

## 2015-12-19 MED ORDER — SODIUM CHLORIDE 0.9 % IV SOLN
INTRAVENOUS | Status: DC | PRN
  Administered 2015-12-19: 30 ug/min via INTRAVENOUS

## 2015-12-19 MED ORDER — INSULIN ASPART 100 UNIT/ML ~~LOC~~ SOLN
0.0000 [IU] | Freq: Three times a day (TID) | SUBCUTANEOUS | Status: DC
  Administered 2015-12-19: 5 [IU] via SUBCUTANEOUS
  Administered 2015-12-20 (×2): 2 [IU] via SUBCUTANEOUS
  Administered 2015-12-20: 3 [IU] via SUBCUTANEOUS
  Administered 2015-12-21: 2 [IU] via SUBCUTANEOUS
  Administered 2015-12-21 – 2015-12-22 (×3): 3 [IU] via SUBCUTANEOUS
  Filled 2015-12-19: qty 3
  Filled 2015-12-19 (×3): qty 2
  Filled 2015-12-19 (×2): qty 3
  Filled 2015-12-19: qty 5
  Filled 2015-12-19 (×2): qty 3

## 2015-12-19 MED ORDER — POTASSIUM CHLORIDE IN NACL 20-0.9 MEQ/L-% IV SOLN
INTRAVENOUS | Status: DC
  Administered 2015-12-19 – 2015-12-20 (×4): via INTRAVENOUS
  Filled 2015-12-19 (×10): qty 1000

## 2015-12-19 MED ORDER — MIDAZOLAM HCL 5 MG/5ML IJ SOLN
INTRAMUSCULAR | Status: DC | PRN
  Administered 2015-12-19: 1 mg via INTRAVENOUS

## 2015-12-19 MED ORDER — BUPIVACAINE-EPINEPHRINE (PF) 0.5% -1:200000 IJ SOLN
INTRAMUSCULAR | Status: DC | PRN
  Administered 2015-12-19: 30 mL via PERINEURAL

## 2015-12-19 MED ORDER — BUPIVACAINE HCL (PF) 0.5 % IJ SOLN
INTRAMUSCULAR | Status: DC | PRN
  Administered 2015-12-19: 2.8 mL

## 2015-12-19 MED ORDER — ACETAMINOPHEN 500 MG PO TABS
1000.0000 mg | ORAL_TABLET | Freq: Four times a day (QID) | ORAL | Status: AC
  Administered 2015-12-19 – 2015-12-20 (×4): 1000 mg via ORAL
  Filled 2015-12-19 (×4): qty 2

## 2015-12-19 MED ORDER — TRANEXAMIC ACID 1000 MG/10ML IV SOLN
INTRAVENOUS | Status: DC | PRN
  Administered 2015-12-19: 1000 mg via TOPICAL

## 2015-12-19 MED ORDER — PROPOFOL 500 MG/50ML IV EMUL
INTRAVENOUS | Status: DC | PRN
  Administered 2015-12-19: 75 ug/kg/min via INTRAVENOUS

## 2015-12-19 MED ORDER — SIMVASTATIN 20 MG PO TABS
40.0000 mg | ORAL_TABLET | Freq: Every day | ORAL | Status: DC
  Administered 2015-12-19 – 2015-12-22 (×4): 40 mg via ORAL
  Filled 2015-12-19 (×4): qty 2

## 2015-12-19 MED ORDER — NEOMYCIN-POLYMYXIN B GU 40-200000 IR SOLN
Status: AC
  Filled 2015-12-19: qty 20

## 2015-12-19 MED ORDER — TRANEXAMIC ACID 1000 MG/10ML IV SOLN
INTRAVENOUS | Status: AC
Start: 2015-12-19 — End: 2015-12-19
  Filled 2015-12-19: qty 10

## 2015-12-19 MED ORDER — CEFAZOLIN SODIUM-DEXTROSE 2-4 GM/100ML-% IV SOLN
2.0000 g | Freq: Four times a day (QID) | INTRAVENOUS | Status: AC
  Administered 2015-12-19 – 2015-12-20 (×3): 2 g via INTRAVENOUS
  Filled 2015-12-19 (×3): qty 100

## 2015-12-19 MED ORDER — ONDANSETRON HCL 4 MG/2ML IJ SOLN
4.0000 mg | Freq: Once | INTRAMUSCULAR | Status: AC | PRN
  Administered 2015-12-19: 4 mg via INTRAVENOUS

## 2015-12-19 MED ORDER — FENTANYL CITRATE (PF) 100 MCG/2ML IJ SOLN: 25.0000 ug | INTRAMUSCULAR | Status: DC | PRN

## 2015-12-19 MED ORDER — GABAPENTIN 600 MG PO TABS
600.0000 mg | ORAL_TABLET | Freq: Every day | ORAL | Status: DC
  Administered 2015-12-19 – 2015-12-21 (×3): 600 mg via ORAL
  Filled 2015-12-19 (×2): qty 1

## 2015-12-19 MED ORDER — INSULIN ASPART 100 UNIT/ML ~~LOC~~ SOLN
3.0000 [IU] | Freq: Once | SUBCUTANEOUS | Status: AC
  Administered 2015-12-19: 3 [IU] via SUBCUTANEOUS

## 2015-12-19 MED ORDER — BUPIVACAINE LIPOSOME 1.3 % IJ SUSP
INTRAMUSCULAR | Status: AC
  Filled 2015-12-19: qty 20

## 2015-12-19 MED ORDER — MAGNESIUM HYDROXIDE 400 MG/5ML PO SUSP
30.0000 mL | Freq: Every day | ORAL | Status: DC | PRN
  Administered 2015-12-21: 30 mL via ORAL
  Filled 2015-12-19: qty 30

## 2015-12-19 MED ORDER — KETOROLAC TROMETHAMINE 30 MG/ML IJ SOLN
INTRAMUSCULAR | Status: AC
  Filled 2015-12-19: qty 1

## 2015-12-19 MED ORDER — PANTOPRAZOLE SODIUM 40 MG PO TBEC
40.0000 mg | DELAYED_RELEASE_TABLET | Freq: Two times a day (BID) | ORAL | Status: DC
  Administered 2015-12-19 – 2015-12-22 (×6): 40 mg via ORAL
  Filled 2015-12-19 (×5): qty 1

## 2015-12-19 MED ORDER — METOCLOPRAMIDE HCL 10 MG PO TABS: 5.0000 mg | ORAL_TABLET | Freq: Three times a day (TID) | ORAL | Status: DC | PRN

## 2015-12-19 MED ORDER — CEFAZOLIN SODIUM-DEXTROSE 2-4 GM/100ML-% IV SOLN
INTRAVENOUS | Status: AC
  Filled 2015-12-19: qty 100

## 2015-12-19 MED ORDER — DIPHENHYDRAMINE HCL 12.5 MG/5ML PO ELIX: 12.5000 mg | ORAL_SOLUTION | ORAL | Status: DC | PRN

## 2015-12-19 MED ORDER — ENALAPRIL MALEATE 5 MG PO TABS
20.0000 mg | ORAL_TABLET | Freq: Two times a day (BID) | ORAL | Status: DC
  Administered 2015-12-20 – 2015-12-22 (×5): 20 mg via ORAL
  Filled 2015-12-19 (×5): qty 4

## 2015-12-19 MED ORDER — OXYCODONE HCL 5 MG PO TABS
5.0000 mg | ORAL_TABLET | ORAL | Status: DC | PRN
  Administered 2015-12-19: 10 mg via ORAL
  Administered 2015-12-19: 5 mg via ORAL
  Administered 2015-12-19 – 2015-12-20 (×5): 10 mg via ORAL
  Administered 2015-12-20: 5 mg via ORAL
  Administered 2015-12-21 – 2015-12-22 (×6): 10 mg via ORAL
  Filled 2015-12-19 (×6): qty 2
  Filled 2015-12-19: qty 1
  Filled 2015-12-19 (×6): qty 2
  Filled 2015-12-19: qty 1

## 2015-12-19 MED ORDER — ONDANSETRON HCL 4 MG/2ML IJ SOLN
INTRAMUSCULAR | Status: AC
  Filled 2015-12-19: qty 2

## 2015-12-19 MED ORDER — FENTANYL CITRATE (PF) 100 MCG/2ML IJ SOLN
INTRAMUSCULAR | Status: DC | PRN
  Administered 2015-12-19: 50 ug via INTRAVENOUS

## 2015-12-19 MED ORDER — KETOROLAC TROMETHAMINE 15 MG/ML IJ SOLN
7.5000 mg | Freq: Four times a day (QID) | INTRAMUSCULAR | Status: AC
  Administered 2015-12-19 – 2015-12-20 (×4): 7.5 mg via INTRAVENOUS
  Filled 2015-12-19 (×4): qty 1

## 2015-12-19 MED ORDER — PHENYLEPHRINE HCL 10 MG/ML IJ SOLN
INTRAMUSCULAR | Status: DC | PRN
  Administered 2015-12-19 (×8): 100 ug via INTRAVENOUS

## 2015-12-19 MED ORDER — LEVOTHYROXINE SODIUM 75 MCG PO TABS
75.0000 ug | ORAL_TABLET | Freq: Every day | ORAL | Status: DC
  Administered 2015-12-20 – 2015-12-22 (×3): 75 ug via ORAL
  Filled 2015-12-19 (×3): qty 1

## 2015-12-19 MED ORDER — SODIUM CHLORIDE 0.9 % IJ SOLN
INTRAMUSCULAR | Status: AC
  Filled 2015-12-19: qty 50

## 2015-12-19 MED ORDER — HYDROMORPHONE HCL 1 MG/ML IJ SOLN: 1.0000 mg | INTRAMUSCULAR | Status: DC | PRN

## 2015-12-19 MED ORDER — KETAMINE HCL 50 MG/ML IJ SOLN
INTRAMUSCULAR | Status: DC | PRN
Start: 2015-12-19 — End: 2015-12-19
  Administered 2015-12-19: 25 mg via INTRAMUSCULAR

## 2015-12-19 MED ORDER — GLIMEPIRIDE 2 MG PO TABS
4.0000 mg | ORAL_TABLET | Freq: Two times a day (BID) | ORAL | Status: DC
  Administered 2015-12-19 – 2015-12-22 (×7): 4 mg via ORAL
  Filled 2015-12-19 (×7): qty 2

## 2015-12-19 MED ORDER — SODIUM CHLORIDE 0.9 % IV SOLN
INTRAVENOUS | Status: DC | PRN
  Administered 2015-12-19: 60 mL

## 2015-12-19 MED ORDER — SODIUM CHLORIDE 0.9 % IV SOLN
INTRAVENOUS | Status: DC
  Administered 2015-12-19 (×2): via INTRAVENOUS

## 2015-12-19 MED ORDER — FLUTICASONE PROPIONATE 50 MCG/ACT NA SUSP
2.0000 | Freq: Every day | NASAL | Status: DC | PRN
  Filled 2015-12-19: qty 16

## 2015-12-19 MED ORDER — NEOMYCIN-POLYMYXIN B GU 40-200000 IR SOLN
Status: DC | PRN
  Administered 2015-12-19: 16 mL

## 2015-12-19 MED ORDER — PIOGLITAZONE HCL 15 MG PO TABS
15.0000 mg | ORAL_TABLET | Freq: Two times a day (BID) | ORAL | Status: DC
  Administered 2015-12-19 – 2015-12-22 (×6): 15 mg via ORAL
  Filled 2015-12-19 (×8): qty 1

## 2015-12-19 MED ORDER — CEFAZOLIN SODIUM-DEXTROSE 2-4 GM/100ML-% IV SOLN
2.0000 g | Freq: Once | INTRAVENOUS | Status: AC
Start: 2015-12-19 — End: 2015-12-19
  Administered 2015-12-19: 2 g via INTRAVENOUS

## 2015-12-19 MED ORDER — ACETAMINOPHEN 650 MG RE SUPP: 650.0000 mg | Freq: Four times a day (QID) | RECTAL | Status: DC | PRN

## 2015-12-19 MED ORDER — KETOROLAC TROMETHAMINE 30 MG/ML IJ SOLN
15.0000 mg | Freq: Once | INTRAMUSCULAR | Status: AC
  Administered 2015-12-19: 15 mg via INTRAVENOUS

## 2015-12-19 MED ORDER — ASPIRIN EC 81 MG PO TBEC
81.0000 mg | DELAYED_RELEASE_TABLET | Freq: Every day | ORAL | Status: DC
  Administered 2015-12-19 – 2015-12-22 (×4): 81 mg via ORAL
  Filled 2015-12-19 (×4): qty 1

## 2015-12-19 MED ORDER — ENOXAPARIN SODIUM 40 MG/0.4ML ~~LOC~~ SOLN
40.0000 mg | SUBCUTANEOUS | Status: DC
  Administered 2015-12-20 – 2015-12-22 (×3): 40 mg via SUBCUTANEOUS
  Filled 2015-12-19 (×3): qty 0.4

## 2015-12-19 MED ORDER — ACETAMINOPHEN 325 MG PO TABS
650.0000 mg | ORAL_TABLET | Freq: Four times a day (QID) | ORAL | Status: DC | PRN
  Administered 2015-12-22: 650 mg via ORAL
  Filled 2015-12-19 (×2): qty 2

## 2015-12-19 MED ORDER — METFORMIN HCL 500 MG PO TABS
1000.0000 mg | ORAL_TABLET | Freq: Two times a day (BID) | ORAL | Status: DC
  Administered 2015-12-19 – 2015-12-22 (×7): 1000 mg via ORAL
  Filled 2015-12-19 (×7): qty 2

## 2015-12-19 MED ORDER — ONDANSETRON HCL 4 MG/2ML IJ SOLN
4.0000 mg | Freq: Four times a day (QID) | INTRAMUSCULAR | Status: DC | PRN
Start: 2015-12-19 — End: 2015-12-22
  Administered 2015-12-21: 4 mg via INTRAVENOUS
  Filled 2015-12-19: qty 2

## 2015-12-19 SURGICAL SUPPLY — 59 items
BANDAGE ELASTIC 6 CLIP ST LF (GAUZE/BANDAGES/DRESSINGS) ×2 IMPLANT
BLADE SAW SAG 25X90X1.19 (BLADE) ×2 IMPLANT
BLADE SURG SZ20 CARB STEEL (BLADE) ×2 IMPLANT
BNDG COHESIVE 6X5 TAN STRL LF (GAUZE/BANDAGES/DRESSINGS) ×2 IMPLANT
BONE CEMENT PALACOSE (Orthopedic Implant) ×4 IMPLANT
CANISTER SUCT 1200ML W/VALVE (MISCELLANEOUS) ×2 IMPLANT
CANISTER SUCT 3000ML (MISCELLANEOUS) ×2 IMPLANT
CAPT KNEE TOTAL 3 ×2 IMPLANT
CATH TRAY METER 16FR LF (MISCELLANEOUS) ×2 IMPLANT
CEMENT BONE PALACOSE (Orthopedic Implant) ×2 IMPLANT
CHLORAPREP W/TINT 26ML (MISCELLANEOUS) ×2 IMPLANT
COMPACT MIXING SYSTEM IMPLANT
COOLER POLAR GLACIER W/PUMP (MISCELLANEOUS) ×2 IMPLANT
COVER MAYO STAND STRL (DRAPES) ×2 IMPLANT
CUFF TOURN 24 STER (MISCELLANEOUS) IMPLANT
CUFF TOURN 30 STER DUAL PORT (MISCELLANEOUS) ×2 IMPLANT
DECANTER SPIKE VIAL GLASS SM (MISCELLANEOUS) ×8 IMPLANT
DRAPE IMP U-DRAPE 54X76 (DRAPES) ×2 IMPLANT
DRAPE INCISE IOBAN 66X45 STRL (DRAPES) ×4 IMPLANT
DRSG OPSITE POSTOP 4X12 (GAUZE/BANDAGES/DRESSINGS) ×2 IMPLANT
DRSG OPSITE POSTOP 4X14 (GAUZE/BANDAGES/DRESSINGS) ×2 IMPLANT
ELECT CAUTERY BLADE 6.4 (BLADE) ×2 IMPLANT
ELECT REM PT RETURN 9FT ADLT (ELECTROSURGICAL) ×2
ELECTRODE REM PT RTRN 9FT ADLT (ELECTROSURGICAL) ×1 IMPLANT
GLOVE BIO SURGEON STRL SZ7.5 (GLOVE) ×4 IMPLANT
GLOVE BIO SURGEON STRL SZ8 (GLOVE) ×4 IMPLANT
GLOVE BIOGEL PI IND STRL 8 (GLOVE) ×1 IMPLANT
GLOVE BIOGEL PI INDICATOR 8 (GLOVE) ×1
GLOVE INDICATOR 8.0 STRL GRN (GLOVE) ×2 IMPLANT
GOWN STRL REUS W/ TWL LRG LVL3 (GOWN DISPOSABLE) ×2 IMPLANT
GOWN STRL REUS W/ TWL XL LVL3 (GOWN DISPOSABLE) ×1 IMPLANT
GOWN STRL REUS W/TWL LRG LVL3 (GOWN DISPOSABLE) ×2
GOWN STRL REUS W/TWL XL LVL3 (GOWN DISPOSABLE) ×1
HANDPIECE INTERPULSE COAX TIP (DISPOSABLE) ×1
HOLDER FOLEY CATH W/STRAP (MISCELLANEOUS) ×2 IMPLANT
HOOD PEEL AWAY FLYTE STAYCOOL (MISCELLANEOUS) ×8 IMPLANT
IMMBOLIZER KNEE 19 BLUE UNIV (SOFTGOODS) ×2 IMPLANT
KIT RM TURNOVER STRD PROC AR (KITS) ×2 IMPLANT
LABEL OR SOLS (LABEL) ×2 IMPLANT
NDL SAFETY 18GX1.5 (NEEDLE) ×2 IMPLANT
NEEDLE 18GX1X1/2 (RX/OR ONLY) (NEEDLE) ×2 IMPLANT
NEEDLE SPNL 20GX3.5 QUINCKE YW (NEEDLE) ×2 IMPLANT
NS IRRIG 1000ML POUR BTL (IV SOLUTION) ×2 IMPLANT
PACK TOTAL KNEE (MISCELLANEOUS) ×2 IMPLANT
PAD WRAPON POLAR KNEE (MISCELLANEOUS) ×1 IMPLANT
SET HNDPC FAN SPRY TIP SCT (DISPOSABLE) ×1 IMPLANT
SOL .9 NS 3000ML IRR  AL (IV SOLUTION) ×1
SOL .9 NS 3000ML IRR UROMATIC (IV SOLUTION) ×1 IMPLANT
STAPLER SKIN PROX 35W (STAPLE) ×2 IMPLANT
SUCTION FRAZIER HANDLE 10FR (MISCELLANEOUS) ×1
SUCTION TUBE FRAZIER 10FR DISP (MISCELLANEOUS) ×1 IMPLANT
SUT VIC AB 0 CT1 36 (SUTURE) ×10 IMPLANT
SUT VIC AB 2-0 CT1 27 (SUTURE) ×5
SUT VIC AB 2-0 CT1 TAPERPNT 27 (SUTURE) ×5 IMPLANT
SYR 20CC LL (SYRINGE) ×2 IMPLANT
SYR 30ML LL (SYRINGE) ×6 IMPLANT
SYRINGE 10CC LL (SYRINGE) ×2 IMPLANT
SYSTEM VACUUM CEMENT MIXING ×2 IMPLANT
WRAPON POLAR PAD KNEE (MISCELLANEOUS) ×2

## 2015-12-19 NOTE — Op Note (Addendum)
12/19/2015  9:50 AM  Patient:   Alexandria Marquez  Pre-Op Diagnosis:   Degenerative joint disease, right knee.  Post-Op Diagnosis:   Same  Procedure:   Right TKA using all-cemented Biomet Vanguard system with a 62.5 mm PCR femur and a 71 mm tibial tray with a 10 mm E-poly insert.  Surgeon:   Maryagnes Amos, MD  Assistant:   Horris Latino, PA-C; Geralyn Flash, PA-S  Anesthesia:   Spinal  Findings:   As above. The patella was too thin to merit resurfacing, measuring only 7 mm in the lateral patellar facet region.  Complications:   None  EBL:   10 cc  Fluids:   1250 cc crystalloid  UOP:   125 cc  TT:   90 minutes at 300 mmHg  Drains:   None  Closure:   Staples  Implants:   As above  Brief Clinical Note:   The patient is a 70 year old female with a long history of progressively worsening right knee pain. The patient's symptoms have progressed despite medications, activity modification, injections, etc. The patient's history and examination were consistent with advanced degenerative joint disease of the right knee confirmed by plain radiographs. The patient presents at this time for a right total knee arthroplasty.  Procedure:   The patient was brought into the operating room. After adequate spinal anesthesia was obtained, the patient was lain in the supine position. A Foley catheter was placed by the nurse before the right lower extremity was prepped with ChloraPrep solution and draped sterilely. Preoperative antibiotics were administered. After verifying the proper laterality with a surgical timeout, the limb was exsanguinated with an Esmarch and the tourniquet inflated to 300 mmHg. A standard anterior approach to the knee was made through an approximately 7 inch incision. The incision was carried down through the subcutaneous tissues to expose superficial retinaculum. This was split the length of the incision and the medial flap elevated sufficiently to expose the medial retinaculum.  The medial retinaculum was incised, leaving a 3-4 mm cuff of tissue on the patella. This was extended distally along the medial border of the patellar tendon and proximally through the medial third of the quadriceps tendon. A subtotal fat pad excision was performed before the soft tissues were elevated off the anteromedial and anterolateral aspects of the proximal tibia to the level of the collateral ligaments. The anterior portions of the medial and lateral menisci were removed, as was the anterior cruciate ligament. With the knee flexed to 90, the external tibial guide was positioned and the appropriate proximal tibial cut made. This piece was taken to the back table where it was measured and found to be optimally replicated by a 71 mm component.  Attention was directed to the distal femur. The intramedullary canal was accessed through a 3/8" drill hole. The intramedullary guide was inserted and position in order to obtain a neutral flexion gap. The intercondylar block was positioned with care taken to avoid notching the anterior cortex of the femur. The appropriate cut was made. Next, the distal cutting block was placed at 6 of valgus alignment. Using the 9 mm slot, the distal cut was made. The distal femur was measured and found to be optimally replicated by the 62.5 mm component. The 62.5 mm 4-in-1 cutting block was positioned and first the posterior, then the posterior chamfer, the anterior chamfer, femoral and intercondylar cuts were made. At this point, the posterior portions medial and lateral menisci were removed. A trial reduction was performed  using the appropriate femoral and tibial components with the 10 mm insert. This demonstrated excellent stability to varus and valgus stressing both in flexion and extension while permitting full extension. Patella tracking was assessed and found to be excellent. Therefore, the tibial guide position was marked on the proximal tibia. The patella thickness was  measured and found to be 14 mm at its thickest point, but only 7 mm more laterally. Therefore, it was felt best not to attempt resurfacing of the patella. Patella tracking was assessed and found to be excellent, passing the "no thumb test". The lug holes were drilled into the distal femur before the trial component was removed, leaving only the tibial tray. The keel was then created using the appropriate tower, reamer, and punch.  The bony surfaces were prepared for cementing by irrigating thoroughly with bacitracin saline solution. A bone plug was fashioned from some of the bone that had been removed previously and used to plug the distal femoral canal. In addition, 20 cc of Exparel diluted out to 60 cc with normal saline and 30 cc of 0.5% Sensorcaine were injected into the postero-medial and postero-lateral aspects of the knee, the medial and lateral gutter regions, and the peri-incisional tissues to help with postoperative analgesia. Meanwhile, the cement was being mixed on the back table. When it was ready, the tibial tray was cemented in first. The excess cement was removed using Personal assistantreer elevators. Next, the femoral component was impacted into place. Again, the excess cement was removed using Personal assistantreer elevators. The 10 mm trial insert was positioned and the knee brought into extension while the cement hardened. Once the cement had hardened, the knee was placed through a range of motion with the findings as described above. Therefore, the trial insert was removed and, after verifying that no cement had been retained posteriorly, the permanent insert was positioned and secured using the appropriate key locking mechanism. Again the knee was placed through a range of motion with the findings as described above.  The wound was copiously irrigated with bacitracin saline solution using the jet lavage system before the quadriceps tendon and retinacular layer were reapproximated using #0 Vicryl interrupted sutures. The  superficial retinacular layer was closed using 2-0 Vicryl interrupted sutures in several layers for the skin was closed using staples. A sterile honeycomb dressing was applied to the skin before the leg was wrapped with an Ace wrap to accommodate the polar pack. The patient was then awakened and returned to the recovery room in satisfactory condition after tolerating the procedure well.

## 2015-12-19 NOTE — Anesthesia Procedure Notes (Signed)
Spinal  Patient location during procedure: OR Start time: 12/19/2015 7:41 AM End time: 12/19/2015 7:43 AM Staffing Anesthesiologist: Martha Clan Resident/CRNA: Lance Muss Performed: resident/CRNA  Preanesthetic Checklist Completed: patient identified, site marked, surgical consent, pre-op evaluation, timeout performed, IV checked, risks and benefits discussed and monitors and equipment checked Spinal Block Patient position: sitting Prep: Betadine Patient monitoring: heart rate, continuous pulse ox, blood pressure and cardiac monitor Approach: midline Location: L4-5 Injection technique: single-shot Needle Needle type: Whitacre and Introducer  Needle gauge: 25 G Needle length: 9 cm Additional Notes Negative paresthesia. Negative blood return. Positive free-flowing CSF. Expiration date of kit checked and confirmed. Patient tolerated procedure well, without complications.

## 2015-12-19 NOTE — Care Management Note (Signed)
Case Management Note  Patient Details  Name: Alexandria Marquez MRN: [id number] Date of Birth: 1945/04/28  Subjective/Objective:  POD  Right TKA. Met with patient and spouse at bedside. Spouse will be patients caregiver at home. No agency preference. Referral to [facility] for [facility]PT. [facility] [phone]. Called lovenox 40 mg # 14, no refills. Ordered a walker from [facility].                   Action/Plan: [facility] for HHPT. [facility] for walker. Called Lovenox in Expected Discharge Date:                  Expected Discharge Plan:  [place]  In-House Referral:     Discharge planning Services  CM Consult  Post Acute Care Choice:  Durable Medical Equipment, Home Health Choice offered to:  Patient, Spouse  DME Arranged:  [facility] DME Agency:  [place]:  PT [place]:  [facility] (now [facility])  Status of Service:  In process, will continue to follow  If discussed at Long Length of Stay Meetings, dates discussed:    Additional Comments:  Jolly Mango, RN 12/19/2015, 12:02 PM

## 2015-12-19 NOTE — Evaluation (Signed)
Physical Therapy Evaluation Patient Details Name: Alexandria Marquez MRN: [id number] DOB: 03/20/1946 Today's Date: 12/19/2015   History of Present Illness  pt. s/p R TKA, hx. HTN, CAD, DM, hypothyroid, hiatal hernia currently WBAT   Clinical Impression  Pt. Supine in bed upon arrival, awake, alert, and oriented. Pt. Demonstrates gross strength WNL, with exception of RLE. Sensation intact throughout. Pt. Able to perform LE exercises in supine with AAROM for SLR, SAQ, and Heel slides -utilized knee immobilizer for further mobility due to pt. Inability to perform SLR. Pt. Demonstrates limited R knee AROM 10-63 degrees at this time. Able to perform bed mobility with min A, and sit<>stand with RW CGA with safe technique. Pt. Able to perform approx. 6steps of ambulation CGA using RW demonstrating short step length/height, step to pattern leading with RLE, and decreased weight acceptance through RLE with verbal cues for stepping and RW negotiation.  Would benefit from skilled PT to address above deficits and promote optimal return to PLOF Recommend SNF placement upon d/c at this time to follow up with further PT needs. Will continue to progress mobility and update recommendations as necessary.     Follow Up Recommendations SNF    Equipment Recommendations  Rolling walker with 5" wheels    Recommendations for Other Services       Precautions / Restrictions Precautions Precautions: Fall Required Braces or Orthoses: Knee Immobilizer - Right Knee Immobilizer - Right:  (Pt. unable to perform SLR, knee immobiliizer applied for mobility) Restrictions Weight Bearing Restrictions: Yes RLE Weight Bearing: Weight bearing as tolerated      Mobility  Bed Mobility Overal bed mobility: Needs Assistance Bed Mobility: Supine to Sit     Supine to sit: Min assist     General bed mobility comments: Pt. able to initiate BLE movement and asssit with B UE with use of rail, required min A to achieve full sitting  position EOB  Transfers Overall transfer level: Needs assistance Equipment used: Rolling walker (2 wheeled) Transfers: Sit to/from Stand Sit to Stand: Min guard         General transfer comment: Pt. demonstrates safe technique without verbal cues pushing off of seating surface. RLE anterior to BOS leans slightly L with transfer.   Ambulation/Gait Ambulation/Gait assistance: Min guard Ambulation Distance (Feet): 2 Feet Assistive device: Rolling walker (2 wheeled)       General Gait Details: step to pattern leading with RLE, decreased weight acceptance/stance time through RLE, small short steps, RW for B UE support, verbal cues for stepping and RW negotiation  Stairs            Wheelchair Mobility    Modified Rankin (Stroke Patients Only)       Balance Overall balance assessment: Needs assistance Sitting-balance support: Bilateral upper extremity supported;Feet supported Sitting balance-Leahy Scale: Good     Standing balance support: Bilateral upper extremity supported Standing balance-Leahy Scale: Good                               Pertinent Vitals/Pain Pain Assessment: 0-10 Pain Score: 4  Pain Location: R knee Pain Intervention(s): Monitored during session;Repositioned;Ice applied    Home Living Family/patient expects to be discharged to:: Private residence Living Arrangements: Spouse/significant other Available Help at Discharge: Family Type of Home: House Home Access: Stairs to enter Entrance Stairs-Rails: Can reach both Entrance Stairs-Number of Steps: 2 Home Layout: One level Home Equipment: Cane - single point;Walker - 4  wheels Additional Comments: owns AD but does not use at baseline    Prior Function Level of Independence: Independent               Hand Dominance        Extremity/Trunk Assessment   Upper Extremity Assessment: Overall WFL for tasks assessed           Lower Extremity Assessment:  (LLE grossly 5/5,  RLE 3-/5 currently limited by pain)         Communication   Communication: No difficulties  Cognition Arousal/Alertness: Awake/alert Behavior During Therapy: WFL for tasks assessed/performed Overall Cognitive Status: Within Functional Limits for tasks assessed                      General Comments      Exercises Total Joint Exercises Goniometric ROM: 10-63 degrees R knee extension/flexion Other Exercises Other Exercises: RLE exercises in supine for AROM/strengthening x10; ankle pumps, SLR (AAROM), heel slides (AAROM), SAQ (AAROM), hip abd/add,    Assessment/Plan    PT Assessment Patient needs continued PT services  PT Problem List Decreased strength;Decreased balance;Decreased range of motion;Decreased activity tolerance;Decreased mobility;Decreased knowledge of use of DME          PT Treatment Interventions DME instruction;Gait training;Functional mobility training;Stair training;Therapeutic exercise;Therapeutic activities;Balance training;Patient/family education    PT Goals (Current goals can be found in the Care Plan section)  Acute Rehab PT Goals Patient Stated Goal: Pt. would like to return home with husband PT Goal Formulation: With patient Time For Goal Achievement: 01/02/16 Potential to Achieve Goals: Fair    Frequency BID   Barriers to discharge Inaccessible home environment      Co-evaluation               End of Session Equipment Utilized During Treatment: Gait belt Activity Tolerance: Patient tolerated treatment well Patient left: in chair;with chair alarm set;with call bell/phone within reach;with SCD's reapplied (cryocuff applied, heels elevated) Nurse Communication: Mobility status         Time: 1610-9604 PT Time Calculation (min) (ACUTE ONLY): 32 min   Charges:         PT G Codes:         Huntley Dec, SPT  12/19/15,4:36 PM

## 2015-12-19 NOTE — Progress Notes (Signed)
Inpatient Diabetes Program Recommendations  AACE/ADA: New Consensus Statement on Inpatient Glycemic Control (2015)  Target Ranges:  Prepandial:   less than 140 mg/dL      Peak postprandial:   less than 180 mg/dL (1-2 hours)      Critically ill patients:  140 - 180 mg/dL   Lab Results  Component Value Date   GLUCAP 275 (H) 12/19/2015    Review of Glycemic Control  Diabetes history: DM2 Outpatient Diabetes medications: Metformin 1000 mg BID, Actos 15 mg BID (suggested that patient increase to 30 mg BID per outpatient PCP visit on 12/12/15), Amaryl 4 mg BID Current orders for Inpatient glycemic control: Pre-op CBG  Inpatient Diabetes Program Recommendations: If admitted, please consider sensitive correction scale TIDWC & QHS.  Thank you,  Kristine LineaKaren Sara Keys, RN, BSN Diabetes Coordinator Inpatient Diabetes Program [phone] (Team Pager) [phone] ([facility] office) [phone] [facility] office) [phone] [facility] office)

## 2015-12-19 NOTE — H&P (Signed)
Paper H&P to be scanned into permanent record. H&P reviewed. No changes. 

## 2015-12-19 NOTE — [place] FL2 (Signed)
[place] MEDICAID FL2 LEVEL OF CARE SCREENING TOOL     IDENTIFICATION  Patient Name: [place] Marquez Birthdate: [birth year] Sex: female Admission Date (Current Location): 12/19/2015  [place] and [place] Number:  Chiropodist and Address:  [facility], [address], [place], [place] [address]      Provider Number: [id number]  Attending Physician Name and Address:  Christena Flake, MD  Relative Name and Phone Number:       Current Level of Care: Hospital Recommended Level of Care: Skilled Nursing Facility Prior Approval Number:    Date Approved/Denied:   PASRR Number:  [id number]  Discharge Plan: SNF    Current Diagnoses: Patient Active Problem List   Diagnosis Date Noted  . Status post total right knee replacement using cement 12/19/2015    Orientation RESPIRATION BLADDER Height & Weight     Self, Time, Situation, Place  Normal External catheter Weight: 178 lb (80.7 kg) Height:  5' (152.4 cm)  BEHAVIORAL SYMPTOMS/MOOD NEUROLOGICAL BOWEL NUTRITION STATUS   (None. )  (None. ) Continent Diet (Diet: Carb Modified )  AMBULATORY STATUS COMMUNICATION OF NEEDS Skin   Extensive Assist Verbally Surgical wounds (Incision: Right Knee)                       Personal Care Assistance Level of Assistance  Bathing, Feeding, Dressing Bathing Assistance: Limited assistance Feeding assistance: Independent Dressing Assistance: Limited assistance     Functional Limitations Info  Sight, Hearing, Speech Sight Info: Impaired Hearing Info: Adequate Speech Info: Adequate    SPECIAL CARE FACTORS FREQUENCY  PT (By licensed PT), OT (By licensed OT)     PT Frequency:  (5) OT Frequency:  (5)            Contractures      Additional Factors Info  Code Status, Allergies, Insulin Sliding Scale Code Status Info:  (Full Code. ) Allergies Info:  (Morphine And Related, Penicillins)   Insulin Sliding Scale Info:  (NovoLog)       Current  Medications (12/19/2015):  This is the current hospital active medication list Current Facility-Administered Medications  Medication Dose Route Frequency Provider Last Rate Last Dose  . 0.9 % NaCl with KCl 20 mEq/ L  infusion   Intravenous Continuous Christena Flake, MD 100 mL/hr at 12/19/15 1247    . acetaminophen (TYLENOL) tablet 650 mg  650 mg Oral Q6H PRN Christena Flake, MD       Or  . acetaminophen (TYLENOL) suppository 650 mg  650 mg Rectal Q6H PRN Christena Flake, MD      . acetaminophen (TYLENOL) tablet 1,000 mg  1,000 mg Oral Q6H Christena Flake, MD   1,000 mg at 12/19/15 1316  . aspirin EC tablet 81 mg  81 mg Oral Daily Christena Flake, MD   81 mg at 12/19/15 1317  . bisacodyl (DULCOLAX) suppository 10 mg  10 mg Rectal Daily PRN Christena Flake, MD      . ceFAZolin (ANCEF) IVPB 2g/100 mL premix  2 g Intravenous Q6H Christena Flake, MD   2 g at 12/19/15 1318  . diphenhydrAMINE (BENADRYL) 12.5 MG/5ML elixir 12.5-25 mg  12.5-25 mg Oral Q4H PRN Christena Flake, MD      . docusate sodium (COLACE) capsule 100 mg  100 mg Oral BID Christena Flake, MD   100 mg at 12/19/15 1316  . DULoxetine (CYMBALTA) DR capsule 60 mg  60 mg  Oral Daily Christena FlakeJohn J Poggi, MD   60 mg at 12/19/15 1317  . [START ON 12/20/2015] enalapril (VASOTEC) tablet 20 mg  20 mg Oral BID Christena FlakeJohn J Poggi, MD      . Melene Muller[START ON 12/20/2015] enoxaparin (LOVENOX) injection 40 mg  40 mg Subcutaneous Q24H Christena FlakeJohn J Poggi, MD      . fluticasone (FLONASE) 50 MCG/ACT nasal spray 2 spray  2 spray Each Nare Daily PRN Christena FlakeJohn J Poggi, MD      . gabapentin (NEURONTIN) tablet 600 mg  600 mg Oral QHS Christena FlakeJohn J Poggi, MD      . glimepiride (AMARYL) tablet 4 mg  4 mg Oral BID WC Christena FlakeJohn J Poggi, MD      . HYDROmorphone (DILAUDID) injection 1-2 mg  1-2 mg Intravenous Q2H PRN Christena FlakeJohn J Poggi, MD      . insulin aspart (novoLOG) injection 0-15 Units  0-15 Units Subcutaneous TID WC Christena FlakeJohn J Poggi, MD      . ketorolac (TORADOL) 15 MG/ML injection 7.5 mg  7.5 mg Intravenous Q6H Christena FlakeJohn J Poggi, MD   7.5 mg at  12/19/15 1203  . ketorolac (TORADOL) 30 MG/ML injection           . [START ON 12/20/2015] levothyroxine (SYNTHROID, LEVOTHROID) tablet 75 mcg  75 mcg Oral QAC breakfast Christena FlakeJohn J Poggi, MD      . magnesium hydroxide (MILK OF MAGNESIA) suspension 30 mL  30 mL Oral Daily PRN Christena FlakeJohn J Poggi, MD      . metFORMIN (GLUCOPHAGE) tablet 1,000 mg  1,000 mg Oral BID WC Christena FlakeJohn J Poggi, MD      . metoCLOPramide (REGLAN) tablet 5-10 mg  5-10 mg Oral Q8H PRN Christena FlakeJohn J Poggi, MD       Or  . metoCLOPramide (REGLAN) injection 5-10 mg  5-10 mg Intravenous Q8H PRN Christena FlakeJohn J Poggi, MD      . ondansetron [facility](ZOFRAN) 4 MG/2ML injection           . ondansetron (ZOFRAN) tablet 4 mg  4 mg Oral Q6H PRN Christena FlakeJohn J Poggi, MD       Or  . ondansetron [facility](ZOFRAN) injection 4 mg  4 mg Intravenous Q6H PRN Christena FlakeJohn J Poggi, MD      . oxyCODONE (Oxy IR/ROXICODONE) immediate release tablet 5-10 mg  5-10 mg Oral Q3H PRN Christena FlakeJohn J Poggi, MD   5 mg at 12/19/15 1317  . pantoprazole (PROTONIX) EC tablet 40 mg  40 mg Oral BID Christena FlakeJohn J Poggi, MD      . pioglitazone (ACTOS) tablet 15 mg  15 mg Oral BID Christena FlakeJohn J Poggi, MD      . simvastatin (ZOCOR) tablet 40 mg  40 mg Oral q1800 Christena FlakeJohn J Poggi, MD      . sodium phosphate (FLEET) 7-19 GM/118ML enema 1 enema  1 enema Rectal Once PRN Christena FlakeJohn J Poggi, MD         Discharge Medications: Please see discharge summary for a list of discharge medications.  Relevant Imaging Results:  Relevant Lab Results:   Additional Information  (SSN: [id number])  Ralene BatheMackenzie Rayfield, Student-Social Work

## 2015-12-19 NOTE — Transfer of Care (Signed)
Immediate Anesthesia Transfer of Care Note  Patient: [place] MazeJudy W Ebey  Procedure(s) Performed: Procedure(s): TOTAL KNEE ARTHROPLASTY (Right)  Patient Location: PACU  Anesthesia Type:Spinal  Level of Consciousness: awake and responds to stimulation  Airway & Oxygen Therapy: Patient Spontanous Breathing and Patient connected to face mask oxygen  Post-op Assessment: Report given to RN and Post -op Vital signs reviewed and stable  Post vital signs: Reviewed and stable  Last Vitals:  Vitals:   12/19/15 0653 12/19/15 1005  BP: 139/65 (!) 146/77  Pulse: 84 82  Resp: 20 15  Temp: 36.4 C     Last Pain:  Vitals:   12/19/15 0653  TempSrc: Oral      Patients Stated Pain Goal: 2 (12/19/15 16100653)  Complications: No apparent anesthesia complications

## 2015-12-19 NOTE — Progress Notes (Signed)
Patient blood sugar 192. PA Horris LatinoLance McGhee notfied, sliding scale novolog ordered.   Harvie HeckMelanie Darol Cush, RN

## 2015-12-19 NOTE — Anesthesia Preprocedure Evaluation (Signed)
Anesthesia Evaluation  Patient identified by MRN, date of birth, ID band Patient awake    Reviewed: Allergy & Precautions, H&P , NPO status , Patient's Chart, lab work & pertinent test results, reviewed documented beta blocker date and time   History of Anesthesia Complications (+) PONV and history of anesthetic complications  Airway Mallampati: II  TM Distance: >3 FB Neck ROM: full    Dental  (+) Caps, Teeth Intact   Pulmonary shortness of breath and with exertion, asthma ,    Pulmonary exam normal breath sounds clear to auscultation       Cardiovascular Exercise Tolerance: Good hypertension, (-) angina+ CAD and + CABG  (-) Past MI and (-) Cardiac Stents Normal cardiovascular exam(-) dysrhythmias (-) Valvular Problems/Murmurs Rhythm:regular Rate:Normal     Neuro/Psych PSYCHIATRIC DISORDERS (Depression) negative neurological ROS     GI/Hepatic Neg liver ROS, hiatal hernia, GERD  ,  Endo/Other  diabetesHypothyroidism   Renal/GU negative Renal ROS  negative genitourinary   Musculoskeletal   Abdominal   Peds  Hematology negative hematology ROS (+)   Anesthesia Other Findings Past Medical History: No date: Anxiety No date: Arthritis No date: Asthma     Comment: when young No date: Coronary artery disease No date: Depression No date: Diabetes mellitus without complication (HCC) No date: GERD (gastroesophageal reflux disease) No date: History of hiatal hernia No date: Hypertension No date: Hypothyroidism No date: Motion sickness No date: Scoliosis No date: Shortness of breath dyspnea     Comment: doe   Reproductive/Obstetrics negative OB ROS                             Anesthesia Physical Anesthesia Plan  ASA: III  Anesthesia Plan: Spinal   Post-op Pain Management:    Induction:   Airway Management Planned:   Additional Equipment:   Intra-op Plan:   Post-operative  Plan:   Informed Consent: I have reviewed the patients History and Physical, chart, labs and discussed the procedure including the risks, benefits and alternatives for the proposed anesthesia with the patient or authorized representative who has indicated his/her understanding and acceptance.   Dental Advisory Given  Plan Discussed with: Anesthesiologist, CRNA and Surgeon  Anesthesia Plan Comments:         Anesthesia Quick Evaluation

## 2015-12-19 NOTE — Anesthesia Procedure Notes (Signed)
Performed by: Alma Mohiuddin Pre-anesthesia Checklist: Patient identified, Emergency Drugs available, Suction available, Patient being monitored and Timeout performed Patient Re-evaluated:Patient Re-evaluated prior to inductionOxygen Delivery Method: Simple face mask       

## 2015-12-20 LAB — HEMOGLOBIN A1C
Hgb A1c MFr Bld: 7.4 % — ABNORMAL HIGH (ref 4.8–5.6)
Mean Plasma Glucose: 166 mg/dL

## 2015-12-20 LAB — GLUCOSE, CAPILLARY
Glucose-Capillary: 138 mg/dL — ABNORMAL HIGH (ref 65–99)
Glucose-Capillary: 140 mg/dL — ABNORMAL HIGH (ref 65–99)
Glucose-Capillary: 179 mg/dL — ABNORMAL HIGH (ref 65–99)
Glucose-Capillary: 178 mg/dL — ABNORMAL HIGH (ref 65–99)

## 2015-12-20 LAB — CBC WITH DIFFERENTIAL/PLATELET
Basophils Absolute: 0 10*3/uL (ref 0–0.1)
Basophils Relative: 0 %
Eosinophils Absolute: 0.6 10*3/uL (ref 0–0.7)
Eosinophils Relative: 5 %
HCT: 38.1 % (ref 35.0–47.0)
Hemoglobin: 12.5 g/dL (ref 12.0–16.0)
Lymphocytes Relative: 22 %
Lymphs Abs: 2.6 10*3/uL (ref 1.0–3.6)
MCH: 30.1 pg (ref 26.0–34.0)
MCHC: 32.8 g/dL (ref 32.0–36.0)
MCV: 91.8 fL (ref 80.0–100.0)
Monocytes Absolute: 0.9 10*3/uL (ref 0.2–0.9)
Monocytes Relative: 7 %
Neutro Abs: 7.8 10*3/uL — ABNORMAL HIGH (ref 1.4–6.5)
Neutrophils Relative %: 66 %
Platelets: 264 10*3/uL (ref 150–440)
RBC: 4.15 MIL/uL (ref 3.80–5.20)
RDW: 13.4 % (ref 11.5–14.5)
WBC: 11.8 10*3/uL — ABNORMAL HIGH (ref 3.6–11.0)

## 2015-12-20 LAB — BASIC METABOLIC PANEL
Anion gap: 6 (ref 5–15)
BUN: 14 mg/dL (ref 6–20)
CO2: 24 mmol/L (ref 22–32)
Calcium: 8.2 mg/dL — ABNORMAL LOW (ref 8.9–10.3)
Chloride: 106 mmol/L (ref 101–111)
Creatinine, Ser: 0.91 mg/dL (ref 0.44–1.00)
GFR calc Af Amer: >60 mL/min (ref >60)
GFR calc non Af Amer: >60 mL/min (ref >60)
Glucose, Bld: 149 mg/dL — ABNORMAL HIGH (ref 65–99)
Potassium: 4 mmol/L (ref 3.5–5.1)
Sodium: 136 mmol/L (ref 135–145)

## 2015-12-20 MED ORDER — OXYCODONE HCL 5 MG PO TABS: 5.0000 mg | ORAL_TABLET | ORAL | 0 refills | Status: DC | PRN

## 2015-12-20 MED ORDER — ENOXAPARIN SODIUM 40 MG/0.4ML ~~LOC~~ SOLN: 40.0000 mg | SUBCUTANEOUS | 0 refills | Status: DC

## 2015-12-20 NOTE — Discharge Summary (Signed)
Physician Discharge Summary  Subjective: 1 Day Post-Op Procedure(s) (LRB): TOTAL KNEE ARTHROPLASTY (Right) Patient reports pain as mild.   Patient seen in rounds with Dr. Joice Lofts. Patient is well, and has had no acute complaints or problems Patient is ready to go to Rehab  Physician Discharge Summary  Patient ID: [place] Marquez MRN: [id number] DOB/AGE: Dec 23, 1945 70 y.o.  Admit date: 12/19/2015 Discharge date: 12/22/2015  Admission Diagnoses:  Discharge Diagnoses:  Active Problems:   Status post total right knee replacement using cement   Discharged Condition: good  Hospital Course: The patient is POD #3 from a R TKR.  She is doing well with PT and improving with activities.  She is safe and ready to go Rehab for PT and OT.    Treatments:  Right TKA using all-cemented [employer] Vanguard system with a 62.5 mm PCR femur and a 71 mm tibial tray with a 10 mm E-poly insert.  Surgeon:   Maryagnes Amos, MD  Assistant:   Horris Latino, PA-C; Geralyn Flash, PA-S  Anesthesia:   Spinal  Findings:   As above. The patella was too thin to merit resurfacing, measuring only 7 mm in the lateral patellar facet region.  Complications:   None  EBL:   10 cc  Fluids:   1250 cc crystalloid  UOP:   125 cc  TT:   90 minutes at 300 mmHg  Drains:   None  Closure:   Staples  Implants:   As above  Discharge Exam: Blood pressure (!) 183/56, pulse 74, temperature 97.6 F (36.4 C), temperature source Oral, resp. rate 17, height 5' (1.524 m), weight 80.7 kg (178 lb), SpO2 100 %.   Disposition: Final discharge disposition not confirmed     Contact information for follow-up providers    Christena Flake, MD Follow up in 2 week(s).   Specialty:  Surgery Contact information: [address] [facility] [place] [address] [phone]            Contact information for after-discharge care    Destination    [facility] .   Specialty:   Skilled Nursing Facility Contact information: [address] [place] [address] [phone]                  Signed: Lenard Forth, Syncere Kaminski 12/20/2015, 11:55 AM   Objective: Vital signs in last 24 hours: Temp:  [97.5 F (36.4 C)-98 F (36.7 C)] 97.6 F (36.4 C) (09/29 1109) Pulse Rate:  [69-80] 74 (09/29 1109) Resp:  [17-18] 17 (09/29 0719) BP: (107-183)/(47-83) 183/56 (09/29 1109) SpO2:  [98 %-100 %] 100 % (09/29 1109)  Intake/Output from previous day:  Intake/Output Summary (Last 24 hours) at 12/20/15 1155 Last data filed at 12/20/15 0900  Gross per 24 hour  Intake              940 ml  Output             2100 ml  Net            -1160 ml    Intake/Output this shift: Total I/O In: 240 [P.O.:240] Out: 250 [Urine:250]  Labs:  Recent Labs  12/20/15 0605  HGB 12.5    Recent Labs  12/20/15 0605  WBC 11.8*  RBC 4.15  HCT 38.1  PLT 264    Recent Labs  12/20/15 0605  NA 136  K 4.0  CL 106  CO2 24  BUN 14  CREATININE 0.91  GLUCOSE 149*  CALCIUM 8.2*   No results for input(s): LABPT, INR in the last 72 hours.  EXAM: General - Patient is Alert and Oriented Extremity - Dorsiflexion/Plantar flexion intact No cellulitis present Compartment soft Incision - clean, dry, no drainage Motor Function -  DF/PF intact with ambulation  Assessment/Plan: 1 Day Post-Op Procedure(s) (LRB): TOTAL KNEE ARTHROPLASTY (Right) Procedure(s) (LRB): TOTAL KNEE ARTHROPLASTY (Right) Past Medical History:  Diagnosis Date  . Anxiety   . Arthritis   . Asthma    when young  . Coronary artery disease   . Depression   . Diabetes mellitus without complication (HCC)   . GERD (gastroesophageal reflux disease)   . History of hiatal hernia   . Hypertension   . Hypothyroidism   . Motion sickness   . Scoliosis   . Shortness of breath dyspnea    doe   Active Problems:   Status post total right knee replacement using cement  Estimated body mass index  is 34.76 kg/m as calculated from the following:   Height as of this encounter: 5' (1.524 m).   Weight as of this encounter: 80.7 kg (178 lb). Advance diet Discharge to SNF on Sunday Diet - Regular diet Follow up - in 2 weeks Activity - WBAT Disposition - Rehab Condition Upon Discharge - Good DVT Prophylaxis - Lovenox and TED hose  Dedra Skeensodd Sharlynn Seckinger, PA-C Orthopaedic Surgery 12/20/2015, 11:55 AM

## 2015-12-20 NOTE — Progress Notes (Signed)
Physical Therapy Treatment Patient Details Name: Alexandria MazeJudy W Peek MRN: [id number] DOB: 11/07/1945 Today's Date: 12/20/2015    History of Present Illness pt. s/p R TKA, hx. HTN, CAD, DM, hypothyroid, hiatal hernia currently WBAT     PT Comments    Pt. In chair upon arrival, Progressing well with mobility this afternoon. Still requiring AAROM for SLR, able to perform other quad strengthening exercises indep. R knee immobilizer donner for further mobility. Pt. BP assessed prior to mobility as she states she has continued to experience dizziness BP 154/65. Able to perform sit<>stand transfer with safe technique without cueing CGA. She was able to ambulate approx. 27300ft. CGA and chair follow, used RW for BUE support demonstrating progression to step through pattern with short step length B, decreased weight acceptance/stace time through RLE, wide BOS potentially associated with knee immobilizer/bandaging positioning. Would benefit from skilled PT to address above deficits and promote optimal return to PLOF Current plan for SNF remains appropriate will continue to progress mobility and update recommendations as necessary.   Follow Up Recommendations  SNF     Equipment Recommendations  Rolling walker with 5" wheels    Recommendations for Other Services       Precautions / Restrictions Precautions Precautions: Fall Required Braces or Orthoses: Knee Immobilizer - Right (Pt. unable to perform SLR, donned knee immobilizer for mobility) Knee Immobilizer - Right:  (Pt. unable to perform SLR, donned R knee immobilizer for mobility) Restrictions Weight Bearing Restrictions: Yes RLE Weight Bearing: Weight bearing as tolerated    Mobility  Bed Mobility Overal bed mobility:  (pt. in chair upon arrival, returned to chair following PT)                Transfers Overall transfer level: Needs assistance Equipment used: Rolling walker (2 wheeled) Transfers: Sit to/from Stand Sit to Stand: Min  guard         General transfer comment: Pt. demonstrates safe technique with all sit<>stand transfers. continues to place RLE anterior to BOS.   Ambulation/Gait Ambulation/Gait assistance: Min guard Ambulation Distance (Feet): 200 Feet Assistive device: Rolling walker (2 wheeled)       General Gait Details: Pt. able to complete approx. 200 ft. of gait with CGA and chair follow for safety. Demonstrates slow cadence, has progressed to step through pattern, shortened step length B, decreased weight acceptance/stance time through RLE. wide BOS possibly associated with Knee immobilizer/bandaging placemetn   Stairs            Wheelchair Mobility    Modified Rankin (Stroke Patients Only)       Balance Overall balance assessment: Needs assistance Sitting-balance support: Feet supported Sitting balance-Leahy Scale: Good     Standing balance support: Bilateral upper extremity supported Standing balance-Leahy Scale: Good                      Cognition Arousal/Alertness: Awake/alert Behavior During Therapy: WFL for tasks assessed/performed Overall Cognitive Status: Within Functional Limits for tasks assessed                      Exercises Other Exercises Other Exercises: Exercises in chair with BLE support from leg rest to isolate quad strengthening; x10 quad sets, SAQ, SLR (AAROM).     General Comments        Pertinent Vitals/Pain Pain Assessment: 0-10 Pain Score: 4  Pain Location: R knee Pain Intervention(s): Monitored during session;Premedicated before session;Ice applied    Home Living  Prior Function            PT Goals (current goals can now be found in the care plan section) Acute Rehab PT Goals Patient Stated Goal: Pt. would like to return home with husband PT Goal Formulation: With patient Time For Goal Achievement: 01/02/16 Potential to Achieve Goals: Fair Progress towards PT goals: Progressing toward  goals    Frequency    BID      PT Plan Current plan remains appropriate    Co-evaluation             End of Session Equipment Utilized During Treatment: Gait belt Activity Tolerance: Patient tolerated treatment well Patient left: in chair;with chair alarm set;with call bell/phone within reach;with family/visitor present;with SCD's reapplied (ice applied, heels elevated, friend in room )     Time: 1610-9604 PT Time Calculation (min) (ACUTE ONLY): 28 min  Charges:                       G Codes:       Huntley Dec, SPT  12/20/15,3:55 PM

## 2015-12-20 NOTE — Anesthesia Postprocedure Evaluation (Signed)
Anesthesia Post Note  Patient: Micheline MazeJudy W Younkin  Procedure(s) Performed: Procedure(s) (LRB): TOTAL KNEE ARTHROPLASTY (Right)  Patient location during evaluation: Nursing Unit Anesthesia Type: Spinal Level of consciousness: awake and alert Pain management: satisfactory to patient Vital Signs Assessment: post-procedure vital signs reviewed and stable Respiratory status: nonlabored ventilation, spontaneous breathing and respiratory function stable Cardiovascular status: stable Postop Assessment: no signs of nausea or vomiting, adequate PO intake and patient able to bend at knees Anesthetic complications: no    Last Vitals:  Vitals:   12/20/15 0406 12/20/15 0719  BP: (!) 127/53 (!) 107/47  Pulse: 80 75  Resp: 18 17  Temp: 36.4 C 36.7 C    Last Pain:  Vitals:   12/20/15 0719  TempSrc: Oral  PainSc:                  Marlana SalvageSandra Domonic [employer]

## 2015-12-20 NOTE — Progress Notes (Signed)
Ch visited the patient upon receiving a request from the nurse. Pt was calm and very optimistic about getting well requested the Ch to prayer for healing which the Ch provided with spiritual support and presence.

## 2015-12-20 NOTE — Clinical Social Work Note (Signed)
Clinical Social Work Assessment  Patient Details  Name: Alexandria Marquez MRN: [id number] Date of Birth: 1945/07/26  Date of referral:  12/20/15               Reason for consult:  Facility Placement                Permission sought to share information with:  Chartered certified accountant granted to share information::  Yes, Verbal Permission Granted  Name::      [place]::   [place]   Relationship::     Contact Information:     Housing/Transportation Living arrangements for the past 2 months:  [place] of Information:  Patient Patient Interpreter Needed:  None Criminal Activity/Legal Involvement Pertinent to Current Situation/Hospitalization:  No - Comment as needed Significant Relationships:  Adult Children, Spouse Lives with:  Spouse Do you feel safe going back to the place where you live?  Yes Need for family participation in patient care:  Yes (Comment)  Care giving concerns:  Patient lives in [place] with her husband [place].    Social Worker assessment / plan: Holiday representative (CSW) received SNF consult. PT is recommending SNF. CSW met with patient to discuss D/C plan. Patient is alert and oriented and was sitting up in the bed. CSW introduced self and explained role of CSW department. Patient is familiar with CSW from [facility] [employer]. Patient reported that she lives in [place] with her husband [place] and has 1 son that lives in [place] and 1 daughter that lives in [place]. CSW explained that PT is recommending SNF and that patient will require a 3 night qualifying inpatient stay in order for Medicare to cover SNF. Patient was admitted to inpatient on 12/19/15. Patient prefers to go home however is agreeable to SNF search in [place].   FL2 complete and faxed out. CSW presented bed offers. Patient chose [place]. CSW will continue to follow and assist as needed.   Employment status:  Disabled  (Comment on whether or not currently receiving Disability), Retired Forensic scientist:  Medicare PT Recommendations:  [place] / Referral to community resources:  [place]  Patient/Family's Response to care:  Patient is agreeable to [place].   Patient/Family's Understanding of and Emotional Response to Diagnosis, Current Treatment, and Prognosis:  Patient was pleasant and thanked CSW for visit.   Emotional Assessment Appearance:  Appears stated age Attitude/Demeanor/Rapport:    Affect (typically observed):  Accepting, Adaptable, Pleasant Orientation:  Oriented to Self, Oriented to Place, Oriented to  Time, Oriented to Situation Alcohol / Substance use:  Not Applicable Psych involvement (Current and /or in the community):  No (Comment)  Discharge Needs  Concerns to be addressed:  Discharge Planning Concerns Readmission within the last 30 days:  No Current discharge risk:  Dependent with Mobility Barriers to Discharge:  Continued Medical Work up   [employer], [place], LCSW 12/20/2015, 11:54 AM

## 2015-12-20 NOTE — Clinical Social Work Placement (Signed)
   CLINICAL SOCIAL WORK PLACEMENT  NOTE  Date:  12/20/2015  Patient Details  Name: Alexandria MazeJudy W Vanderstelt MRN: [id number] Date of Birth: 08/17/1945  Clinical Social Work is seeking post-discharge placement for this patient at the Skilled  Nursing Facility level of care (*CSW will initial, date and re-position this form in  chart as items are completed):  Yes   Patient/family provided with [facility] Clinical Social Work Department's list of facilities offering this level of care within the geographic area requested by the patient (or if unable, by the patient's family).  Yes   Patient/family informed of their freedom to choose among providers that offer the needed level of care, that participate in Medicare, Medicaid or managed care program needed by the patient, have an available bed and are willing to accept the patient.  Yes   Patient/family informed of [facility]'s ownership interest in [facility] and [facility], as well as of the fact that they are under no obligation to receive care at these facilities.  PASRR submitted to EDS on 12/19/15     PASRR number received on 12/19/15     Existing PASRR number confirmed on       FL2 transmitted to all facilities in geographic area requested by pt/family on 12/20/15     FL2 transmitted to all facilities within larger geographic area on       Patient informed that his/her managed care company has contracts with or will negotiate with certain facilities, including the following:        Yes   Patient/family informed of bed offers received.  Patient chooses bed at  [facility] )     Physician recommends and patient chooses bed at      Patient to be transferred to   on  .  Patient to be transferred to facility by       Patient family notified on   of transfer.  Name of family member notified:        PHYSICIAN       Additional Comment:    _______________________________________________ Margareta Laureano, Darleen CrockerBailey M, LCSW 12/20/2015,  11:50 AM

## 2015-12-20 NOTE — Progress Notes (Signed)
  Subjective: 1 Day Post-Op Procedure(s) (LRB): TOTAL KNEE ARTHROPLASTY (Right) Patient reports pain as mild.   Patient seen in rounds with Dr. Joice LoftsPoggi. Patient is well, and has had no acute complaints or problems Plan is to go Home after hospital stay. Negative for chest pain and shortness of breath Fever: no Gastrointestinal: Negative for nausea and vomiting  Objective: Vital signs in last 24 hours: Temp:  [97.4 F (36.3 C)-98.4 F (36.9 C)] 97.6 F (36.4 C) (09/29 0406) Pulse Rate:  [69-84] 80 (09/29 0406) Resp:  [12-20] 18 (09/29 0406) BP: (116-167)/(53-83) 127/53 (09/29 0406) SpO2:  [96 %-100 %] 98 % (09/29 0406) FiO2 (%):  [21 %] 21 % (09/28 1115) Weight:  [80.7 kg (178 lb)] 80.7 kg (178 lb) (09/28 0653)  Intake/Output from previous day:  Intake/Output Summary (Last 24 hours) at 12/20/15 0603 Last data filed at 12/20/15 0547  Gross per 24 hour  Intake           2272.5 ml  Output             2045 ml  Net            227.5 ml    Intake/Output this shift: Total I/O In: -  Out: 1650 [Urine:1650]  Labs: No results for input(s): HGB in the last 72 hours. No results for input(s): WBC, RBC, HCT, PLT in the last 72 hours. No results for input(s): NA, K, CL, CO2, BUN, CREATININE, GLUCOSE, CALCIUM in the last 72 hours. No results for input(s): LABPT, INR in the last 72 hours.   EXAM General - Patient is Alert and Oriented Extremity - Dorsiflexion/Plantar flexion intact No cellulitis present Compartment soft Dressing/Incision - clean, dry, no drainage Motor Function - intact, moving foot and toes well on exam. The patient ambulated to the chair last night.  Past Medical History:  Diagnosis Date  . Anxiety   . Arthritis   . Asthma    when young  . Coronary artery disease   . Depression   . Diabetes mellitus without complication (HCC)   . GERD (gastroesophageal reflux disease)   . History of hiatal hernia   . Hypertension   . Hypothyroidism   . Motion sickness    . Scoliosis   . Shortness of breath dyspnea    doe    Assessment/Plan: 1 Day Post-Op Procedure(s) (LRB): TOTAL KNEE ARTHROPLASTY (Right) Active Problems:   Status post total right knee replacement using cement  Estimated body mass index is 34.76 kg/m as calculated from the following:   Height as of this encounter: 5' (1.524 m).   Weight as of this encounter: 80.7 kg (178 lb). Advance diet Up with therapy D/C IV fluids  The patient will discharge home in 1-2 days. She will work with physical therapy and occupational therapy.  DVT Prophylaxis - Lovenox, Foot Pumps and TED hose Weight-Bearing as tolerated to right leg  Alexandria Skeensodd Minnette Merida, PA-C Orthopaedic Surgery 12/20/2015, 6:03 AM

## 2015-12-20 NOTE — Care Management (Signed)
Patient has already selected [facility] for Home health PT. Lovenox $40.82. Patient will need walker prior to discharge too.

## 2015-12-20 NOTE — Progress Notes (Signed)
Pt alert and oriented.   Tolerated CPM for 8 hours 2199-050.pain medication effective

## 2015-12-20 NOTE — Progress Notes (Signed)
Tolerating CPM machine this evening

## 2015-12-20 NOTE — Progress Notes (Signed)
Physical Therapy Treatment Patient Details Name: Alexandria Marquez MRN: [id number] DOB: [birth year] Today's Date: 12/20/2015    History of Present Illness pt. s/p R TKA, hx. HTN, CAD, DM, hypothyroid, hiatal hernia currently WBAT     PT Comments    Pt. Seated in chair upon arrival, awake, alert, and oriented. Pt. Able to perform LE exercises showing improvement but remaining quad activation deficits, able to perform quad sets and SAQ indep (AAROM yesterday) still requires AAROM for SLR, donned R knee immobilizer for further mobility. R Knee AROM improved today 9-75 degrees ext/flex.  Pt. Demonstrates safe technique with Sit<>stand transfers CGA. Able to perform approx. 74ft. Of gait today CGA with use of RW for B UE support demonstrates very slow cadence, step to pattern leading with RLE, decreased weight acceptance/stance time on RLE. Pt. Able to return to supine with min A for RLE support/management. BP assessed throughout session, Pt. Reported initial dizziness with standing and onset of dizziness/nausea following ambulation, values remained stable with each assessment. Would benefit from skilled PT to address above deficits and promote optimal return to PLOF Current plan for SNF remains appropriate, will continue to progress mobilitly and update recommendations as necessary.    Follow Up Recommendations  SNF     Equipment Recommendations  Rolling walker with 5" wheels    Recommendations for Other Services       Precautions / Restrictions Precautions Precautions: Fall Required Braces or Orthoses: Knee Immobilizer - Right (Pt. unable to perform SLR, knee immobilizer donner for mobility) Knee Immobilizer - Right:  (Pt. unable to perform SLR, knee immobilizer donner for mobility) Restrictions Weight Bearing Restrictions: Yes RLE Weight Bearing: Weight bearing as tolerated    Mobility  Bed Mobility Overal bed mobility: Needs Assistance Bed Mobility: Sit to Supine     Supine to sit: Min  assist     General bed mobility comments: assist for R LE support and management.   Transfers Overall transfer level: Needs assistance Equipment used: Rolling walker (2 wheeled) Transfers: Sit to/from Stand Sit to Stand: Min guard         General transfer comment: Pt. demonstrates safe technique with all sit<>stand transfers. continues to place RLE anterior to BOS and demonstrates slight L lateral lean with transfer.   Ambulation/Gait Ambulation/Gait assistance: Min guard Ambulation Distance (Feet): 40 Feet Assistive device: Rolling walker (2 wheeled)       General Gait Details: Pt. demonstrates very slow cadence, step to pattern leading with RLE, decreased weight acceptance/stance time on RLE. relies on RW for B UE support.    Stairs            Wheelchair Mobility    Modified Rankin (Stroke Patients Only)       Balance Overall balance assessment: Needs assistance Sitting-balance support: Feet supported;Bilateral upper extremity supported Sitting balance-Leahy Scale: Good     Standing balance support: Bilateral upper extremity supported Standing balance-Leahy Scale: Good                      Cognition Arousal/Alertness: Awake/alert Behavior During Therapy: WFL for tasks assessed/performed Overall Cognitive Status: Within Functional Limits for tasks assessed                      Exercises Total Joint Exercises Goniometric ROM: 9-75 degrees R knee ext/flex Other Exercises Other Exercises: Sitting with LE suported by leg rest x10: Ankle pumps, quad sets, SLR (AAROM), SAQ, heel slides.    General Comments  Pertinent Vitals/Pain Pain Assessment: No/denies pain (pt. not in pain at rest) Pain Intervention(s): Monitored during session;Ice applied    Home Living                      Prior Function            PT Goals (current goals can now be found in the care plan section) Acute Rehab PT Goals Patient Stated Goal:  Pt. would like to return home with husband PT Goal Formulation: With patient Time For Goal Achievement: 01/02/16 Potential to Achieve Goals: Fair Progress towards PT goals: Progressing toward goals    Frequency    BID      PT Plan Current plan remains appropriate    Co-evaluation             End of Session Equipment Utilized During Treatment: Gait belt Activity Tolerance: Patient tolerated treatment well Patient left: in bed;with call bell/phone within reach;with family/visitor present;with SCD's reapplied;with bed alarm set (ice applied, heels elevated, husband present )     Time: [id number] PT Time Calculation (min) (ACUTE ONLY): 33 min  Charges:                       G Codes:      Huntley DecLeah Boone Gear, SPT  12/20/15,1:07 PM

## 2015-12-20 NOTE — Progress Notes (Signed)
Plan is for patient to D/C to [facility] room [facility] on Sunday pending medical clearance. Clinical Child psychotherapistocial Worker (CSW) sent [facility]/C orders to [facility] today. Patient is aware of above. CSW will continue to follow and assist as needed.   Baker Hughes IncorporatedBailey Rufus Cypert, LCSW [phone]

## 2015-12-20 NOTE — Progress Notes (Signed)
Held BP med for BP of 107/47, will reassess.

## 2015-12-20 NOTE — Progress Notes (Signed)
Inpatient Diabetes Program Recommendations  AACE/ADA: New Consensus Statement on Inpatient Glycemic Control (2015)  Target Ranges:  Prepandial:   less than 140 mg/dL      Peak postprandial:   less than 180 mg/dL (1-2 hours)      Critically ill patients:  140 - 180 mg/dL   Results for Micheline MazeJONES, Sabriyah W (MRN [id number]) as of 12/20/2015 08:02  Ref. Range 12/19/2015 10:08 12/19/2015 12:07 12/19/2015 16:24 12/19/2015 21:26 12/20/2015 07:16  Glucose-Capillary Latest Ref Range: 65 - 99 mg/dL 147178 (H) 829192 (H) 562215 (H) 204 (H) 138 (H)  Results for Micheline MazeJONES, Berlene W (MRN [id number]) as of 12/20/2015 08:02  Ref. Range 12/19/2015 13:09  Hemoglobin A1C Latest Ref Range: 4.8 - 5.6 % 7.4 (H)   Review of Glycemic Control  Diabetes history: DM2 Outpatient Diabetes medications: Metformin 1000 mg BID, Actos 15 mg BID (suggested that patient increase to 30 mg BID per outpatient PCP visit on 12/12/15), Amaryl 4 mg BID Current orders for Inpatient glycemic control: Metformin 1000 mg BID, Actos 15 mg BID, Amaryl 4 mg BID, Novolog Moderate SSI Correction  Inpatient Diabetes Program Recommendations: Agree with current plan, will continue to monitor trends.  Thank you,  Kristine LineaKaren Kylinn Shropshire, RN, BSN Diabetes Coordinator Inpatient Diabetes Program [phone] (Team Pager) [phone] ([facility] office) [phone] [facility] office) [phone] [facility] office)

## 2015-12-20 NOTE — Discharge Instructions (Signed)

## 2015-12-21 LAB — GLUCOSE, CAPILLARY
Glucose-Capillary: 171 mg/dL — ABNORMAL HIGH (ref 65–99)
Glucose-Capillary: 122 mg/dL — ABNORMAL HIGH (ref 65–99)
Glucose-Capillary: 182 mg/dL — ABNORMAL HIGH (ref 65–99)
Glucose-Capillary: 146 mg/dL — ABNORMAL HIGH (ref 65–99)

## 2015-12-21 LAB — BASIC METABOLIC PANEL
Anion gap: 1 — ABNORMAL LOW (ref 5–15)
BUN: 11 mg/dL (ref 6–20)
CO2: 28 mmol/L (ref 22–32)
Calcium: 8.4 mg/dL — ABNORMAL LOW (ref 8.9–10.3)
Chloride: 104 mmol/L (ref 101–111)
Creatinine, Ser: 0.86 mg/dL (ref 0.44–1.00)
GFR calc Af Amer: >60 mL/min (ref >60)
GFR calc non Af Amer: >60 mL/min (ref >60)
Glucose, Bld: 178 mg/dL — ABNORMAL HIGH (ref 65–99)
Potassium: 4.7 mmol/L (ref 3.5–5.1)
Sodium: 133 mmol/L — ABNORMAL LOW (ref 135–145)

## 2015-12-21 MED ORDER — OXYCODONE HCL 5 MG PO TABS: 5.0000 mg | ORAL_TABLET | ORAL | 0 refills | Status: DC | PRN

## 2015-12-21 MED ORDER — LACTULOSE 10 GM/15ML PO SOLN
10.0000 g | Freq: Two times a day (BID) | ORAL | Status: DC | PRN
  Administered 2015-12-21: 10 g via ORAL
  Filled 2015-12-21: qty 30

## 2015-12-21 NOTE — Progress Notes (Signed)
   Subjective: 2 Days Post-Op Procedure(s) (LRB): TOTAL KNEE ARTHROPLASTY (Right) Patient reports pain as moderate.   Patient is well, and has had no acute complaints or problems Continue with  therapy today.  Plan is to go Home after hospital stay. no nausea and vomiting Patient denies any chest pains or shortness of breath. Objective: Vital signs in last 24 hours: Temp:  [97.6 F (36.4 C)-98.4 F (36.9 C)] 98.4 F (36.9 C) (09/30 0415) Pulse Rate:  [74-95] 95 (09/30 0415) Resp:  [17-19] 19 (09/30 0415) BP: (145-183)/(52-65) 145/61 (09/30 0415) SpO2:  [96 %-100 %] 96 % (09/30 0415) well approximated incision Heels are non tender and elevated off the bed using rolled towels Intake/Output from previous day: 09/29 0701 - 09/30 0700 In: 480 [P.O.:480] Out: 250 [Urine:250] Intake/Output this shift: No intake/output data recorded.   Recent Labs  12/20/15 0605  HGB 12.5    Recent Labs  12/20/15 0605  WBC 11.8*  RBC 4.15  HCT 38.1  PLT 264    Recent Labs  12/20/15 0605 12/21/15 0344  NA 136 133*  K 4.0 4.7  CL 106 104  CO2 24 28  BUN 14 11  CREATININE 0.91 0.86  GLUCOSE 149* 178*  CALCIUM 8.2* 8.4*   No results for input(s): LABPT, INR in the last 72 hours.  EXAM General - Patient is Alert, Appropriate and Oriented Extremity - Neurologically intact Neurovascular intact Sensation intact distally Intact pulses distally Dorsiflexion/Plantar flexion intact Compartment soft Dressing - moderate drainage Motor Function - intact, moving foot and toes well on exam.    Past Medical History:  Diagnosis Date  . Anxiety   . Arthritis   . Asthma    when young  . Coronary artery disease   . Depression   . Diabetes mellitus without complication (HCC)   . GERD (gastroesophageal reflux disease)   . History of hiatal hernia   . Hypertension   . Hypothyroidism   . Motion sickness   . Scoliosis   . Shortness of breath dyspnea    doe    Assessment/Plan: 2  Days Post-Op Procedure(s) (LRB): TOTAL KNEE ARTHROPLASTY (Right) Active Problems:   Status post total right knee replacement using cement  Estimated body mass index is 34.76 kg/m as calculated from the following:   Height as of this encounter: 5' (1.524 m).   Weight as of this encounter: 80.7 kg (178 lb). Up with therapy Discharge home with home health  Labs: reviewed DVT Prophylaxis - Lovenox, Foot Pumps and TED hose Weight-Bearing as tolerated to right leg Pt needs a bowel movement May be d/c to home after pt has a bowel movement and does steps. Dressing changed today  Lynnda ShieldsJon R. [facility] PA [facility] 12/21/2015, 7:57 AM

## 2015-12-21 NOTE — Progress Notes (Signed)
Pt remaining alert and oriented. Medicated for pain through the night. Iv infusing without difficulty. Tolerated CPM during the evening. Able to sleep in between care.

## 2015-12-21 NOTE — Progress Notes (Signed)
Physical Therapy Treatment Patient Details Name: Alexandria Marquez MRN: [id number] DOB: 12/02/1945 Today's Date: 12/21/2015    History of Present Illness pt. s/p R TKA, hx. HTN, CAD, DM, hypothyroid, hiatal hernia currently WBAT     PT Comments    Pt able to participate after multiple offers this am but agreed to try.  She participated in supine and seated AAROM as described below.  She remained sitting on edge of bed about 15 minutes but continued to complain of dizziness that was not relieved.  Worse when looking up.  BP 156/67.  She also complained of worsening nausea.  ROM 3-56 degrees in sitting which she reports was limited by pain/nausea.  Offered to assist pt to chair but she stated she needed to lay back down.  Discussed with primary nurse and relayed pt's request for nausea medication.  Discharge is planned to home this afternoon after steps.  Pt with poor tolerance for therapy this morning.  Pt stated she is not a "moring person" and had trouble yesterday morning too.  Discussed with nurse and care management/SWS.   Follow Up Recommendations  Home health PT;SNF;Other (comment) (Pt stated d/c planned to home, ? SNF if not improved this pm)     Equipment Recommendations  Rolling walker with 5" wheels    Recommendations for Other Services       Precautions / Restrictions Precautions Precautions: Fall Required Braces or Orthoses: Knee Immobilizer - Right Restrictions Weight Bearing Restrictions: Yes RLE Weight Bearing: Weight bearing as tolerated    Mobility  Bed Mobility Overal bed mobility: Needs Assistance Bed Mobility: Sit to Supine;Supine to Sit     Supine to sit: Mod assist Sit to supine: Mod assist      Transfers Overall transfer level: Needs assistance               General transfer comment: Standing deferred due to dizziness BP 156/67 and nausea/pain limiting mobility  Ambulation/Gait             General Gait Details: deferred due to dizziness,  nausea and pain   Stairs            Wheelchair Mobility    Modified Rankin (Stroke Patients Only)       Balance                                    Cognition Arousal/Alertness: Awake/alert Behavior During Therapy: WFL for tasks assessed/performed Overall Cognitive Status: Within Functional Limits for tasks assessed                      Exercises Total Joint Exercises Goniometric ROM: 3-56 Other Exercises Other Exercises: Supine AROM for quad sets and ankle pumps x 10, AAROM for SLR, SAQ, Ab/Adduction, LAQ and knee flexion in supine and sitting    General Comments        Pertinent Vitals/Pain Pain Assessment: 0-10 Pain Score: 7  Pain Location: R knee Pain Intervention(s): Premedicated before session;Limited activity within patient's tolerance;Monitored during session;Ice applied    Home Living                      Prior Function            PT Goals (current goals can now be found in the care plan section)      Frequency    BID  PT Plan Current plan remains appropriate    Co-evaluation             End of Session   Activity Tolerance: Patient limited by pain;Other (comment) (nausea and dizziness) Patient left: in bed;with call bell/phone within reach;with bed alarm set     Time: 0940-1008 PT Time Calculation (min) (ACUTE ONLY): 28 min  Charges:  $Therapeutic Exercise: 8-22 mins $Therapeutic Activity: 8-22 mins                    G Codes:      Danielle Dess, PTA 12/21/15, 10:43 AM

## 2015-12-21 NOTE — Progress Notes (Signed)
Physical Therapy Treatment Patient Details Name: Alexandria Marquez MRN: [id number] DOB: 1946-02-09 Today's Date: 12/21/2015    History of Present Illness pt. s/p R TKA, hx. HTN, CAD, DM, hypothyroid, hiatal hernia currently WBAT     PT Comments    Pt agrees to pm session.  Participated in exercises as described below.  Pt was able to get to edge of bed with mod a.  Stood with mod a x 1 unsucesfully on first attempt and a second assist was obtained for safety.  She was then able to ambulate 120' with walker and min assist with wheelchair follow for safety as she continued to complain of some dizziness with head movements during gait.  Nausea improved.  She reqeusted to return to bed for a nap at end of session and was encouraged to get back up in recliner later in the day.   Follow Up Recommendations  Home health PT;SNF;Other (comment)     Equipment Recommendations  Rolling walker with 5" wheels    Recommendations for Other Services       Precautions / Restrictions Precautions Precautions: Fall Required Braces or Orthoses: Knee Immobilizer - Right Restrictions Weight Bearing Restrictions: Yes RLE Weight Bearing: Weight bearing as tolerated    Mobility  Bed Mobility Overal bed mobility: Needs Assistance Bed Mobility: Supine to Sit;Sit to Supine     Supine to sit: Mod assist Sit to supine: Mod assist      Transfers Overall transfer level: Needs assistance Equipment used: Rolling walker (2 wheeled) Transfers: Sit to/from Stand Sit to Stand: Mod assist (second assist for safety this pm)         General transfer comment: Standing deferred due to dizziness BP 156/67 and nausea/pain limiting mobility  Ambulation/Gait Ambulation/Gait assistance: Min assist;+2 safety/equipment Ambulation Distance (Feet): 120 Feet Assistive device: Rolling walker (2 wheeled) Gait Pattern/deviations: Step-to pattern     General Gait Details: deferred due to dizziness, nausea and  pain   Stairs            Wheelchair Mobility    Modified Rankin (Stroke Patients Only)       Balance Overall balance assessment: Needs assistance Sitting-balance support: Feet supported Sitting balance-Leahy Scale: Good     Standing balance support: Bilateral upper extremity supported Standing balance-Leahy Scale: Good                      Cognition Arousal/Alertness: Awake/alert Behavior During Therapy: WFL for tasks assessed/performed Overall Cognitive Status: Within Functional Limits for tasks assessed                      Exercises Total Joint Exercises Ankle Circles/Pumps: AROM;Both;Supine Quad Sets: AROM;Both;10 reps;Supine Heel Slides: AAROM;Left;10 reps;Supine Hip ABduction/ADduction: AAROM;Left;10 reps;Supine Straight Leg Raises: AAROM;Left;10 reps;Supine Long Arc Quad: AROM;Left;10 reps;Supine Knee Flexion: AAROM;Left;10 reps Goniometric ROM: 3-56 Other Exercises Other Exercises: Supine AROM for quad sets and ankle pumps x 10, AAROM for SLR, SAQ, Ab/Adduction, LAQ and knee flexion in supine and sitting    General Comments        Pertinent Vitals/Pain Pain Assessment: 0-10 Pain Score: 6  Pain Location: R knee Pain Descriptors / Indicators: Aching;Constant Pain Intervention(s): Premedicated before session;Limited activity within patient's tolerance    Home Living                      Prior Function            PT Goals (current goals  can now be found in the care plan section)      Frequency    BID      PT Plan Current plan remains appropriate    Co-evaluation             End of Session Equipment Utilized During Treatment: Gait belt Activity Tolerance: Patient tolerated treatment well;Patient limited by fatigue Patient left: in bed;with call bell/phone within reach;with bed alarm set     Time: 1224-1250 PT Time Calculation (min) (ACUTE ONLY): 26 min  Charges:  $Gait Training: 8-22  mins $Therapeutic Exercise: 8-22 mins $Therapeutic Activity: 8-22 mins                    G Codes:      Danielle DessSarah Jerzy Roepke 12/21/2015, 12:57 PM

## 2015-12-22 LAB — BASIC METABOLIC PANEL
Anion gap: 4 — ABNORMAL LOW (ref 5–15)
BUN: 9 mg/dL (ref 6–20)
CO2: 27 mmol/L (ref 22–32)
Calcium: 8.6 mg/dL — ABNORMAL LOW (ref 8.9–10.3)
Chloride: 102 mmol/L (ref 101–111)
Creatinine, Ser: 0.79 mg/dL (ref 0.44–1.00)
GFR calc Af Amer: >60 mL/min (ref >60)
GFR calc non Af Amer: >60 mL/min (ref >60)
Glucose, Bld: 170 mg/dL — ABNORMAL HIGH (ref 65–99)
Potassium: 4.7 mmol/L (ref 3.5–5.1)
Sodium: 133 mmol/L — ABNORMAL LOW (ref 135–145)

## 2015-12-22 LAB — GLUCOSE, CAPILLARY
Glucose-Capillary: 160 mg/dL — ABNORMAL HIGH (ref 65–99)
Glucose-Capillary: 137 mg/dL — ABNORMAL HIGH (ref 65–99)

## 2015-12-22 MED ORDER — TRAMADOL HCL 50 MG PO TABS: 50.0000 mg | ORAL_TABLET | ORAL | Status: DC | PRN

## 2015-12-22 MED ORDER — LACTULOSE 10 GM/15ML PO SOLN: 10.0000 g | Freq: Two times a day (BID) | ORAL | Status: DC | PRN

## 2015-12-22 MED ORDER — TRAMADOL HCL 50 MG PO TABS: 50.0000 mg | ORAL_TABLET | ORAL | 1 refills | Status: DC | PRN

## 2015-12-22 MED ORDER — MAGNESIUM CITRATE PO SOLN
1.0000 | ORAL | Status: AC
  Administered 2015-12-22: 1 via ORAL
  Filled 2015-12-22: qty 296

## 2015-12-22 NOTE — Progress Notes (Signed)
Physical Therapy Treatment Patient Details Name: Alexandria MazeJudy W Marrufo MRN: [id number] DOB: 02/08/1946 Today's Date: 12/22/2015    History of Present Illness 70 y/o female s/p R TKA (POD3), hx. HTN, CAD, DM, hypothyroid, hiatal hernia currently WBAT     PT Comments    Pt feeling better today with no nausea or dizziness.  She does have expected pain with ROM acts but with a lot of cuing and encouragement is able to tolerate increased PROM overpressure and achieve just 2 degrees from TKE and >60 degrees of flexion.  Pt shows good ambulation tolerance once she gets going and ultimately had consistent and appropriate cadence.  Pt's R quad still very weak and unable to do SLRs and even struggles with basic assist SAQ, but again shows good effort. Pt weak but motivated.   Follow Up Recommendations  SNF     Equipment Recommendations       Recommendations for Other Services       Precautions / Restrictions Precautions Precautions: Fall Required Braces or Orthoses: Knee Immobilizer - Right Restrictions RLE Weight Bearing: Weight bearing as tolerated    Mobility  Bed Mobility Overal bed mobility: Needs Assistance Bed Mobility: Supine to Sit;Sit to Supine     Supine to sit: Min guard;Min assist     General bed mobility comments: Pt with heavy need for UE use on rails, but with cues and using L foot to assist with R LE to EOB she ultimately did welll  Transfers Overall transfer level: Modified independent Equipment used: Rolling walker (2 wheeled) Transfers: Sit to/from Stand Sit to Stand: Min guard         General transfer comment: Pt needing a lot of cuing for set up, sequencing and encouragment but ultimately was able to rise w/o direct phyiscal PT assist.   Ambulation/Gait Ambulation/Gait assistance: Min guard Ambulation Distance (Feet): 200 Feet Assistive device: Rolling walker (2 wheeled)       General Gait Details: Pt initially slow and hesitant, but after warming up she  did well, was able to maintain forward walker momentum with no R sided limp and generally maintain slow but consistent cadence.    Stairs            Wheelchair Mobility    Modified Rankin (Stroke Patients Only)       Balance                                    Cognition Arousal/Alertness: Awake/alert Behavior During Therapy: WFL for tasks assessed/performed Overall Cognitive Status: Within Functional Limits for tasks assessed                      Exercises Total Joint Exercises Ankle Circles/Pumps: AROM;10 reps Quad Sets: Strengthening;10 reps Gluteal Sets: Strengthening;10 reps Short Arc Quad: AAROM;10 reps Heel Slides: AAROM;10 reps Hip ABduction/ADduction: AROM;AAROM;10 reps Straight Leg Raises: AAROM;5 reps Knee Flexion: PROM;5 reps Goniometric ROM: 2-64    General Comments        Pertinent Vitals/Pain Pain Score: 7  Pain Location: R knee    Home Living                      Prior Function            PT Goals (current goals can now be found in the care plan section) Progress towards PT goals: Progressing toward goals  Frequency    BID      PT Plan Current plan remains appropriate    Co-evaluation             End of Session Equipment Utilized During Treatment: Gait belt Activity Tolerance: Patient limited by pain;Patient tolerated treatment well Patient left: with chair alarm set;with call bell/phone within reach;with family/visitor present     Time: 4098-1191 PT Time Calculation (min) (ACUTE ONLY): 43 min  Charges:  $Gait Training: 8-22 mins $Therapeutic Exercise: 23-37 mins                    G Codes:      Malachi Pro, DPT 12/22/2015, 2:00 PM

## 2015-12-22 NOTE — Clinical Social Work Placement (Signed)
   CLINICAL SOCIAL WORK PLACEMENT  NOTE  Date:  12/22/2015  Patient Details  Name: Alexandria MazeJudy W Fleites MRN: [id number] Date of Birth: 09/14/1945  Clinical Social Work is seeking post-discharge placement for this patient at the Skilled  Nursing Facility level of care (*CSW will initial, date and re-position this form in  chart as items are completed):  Yes   Patient/family provided with [facility] Clinical Social Work Department's list of facilities offering this level of care within the geographic area requested by the patient (or if unable, by the patient's family).  Yes   Patient/family informed of their freedom to choose among providers that offer the needed level of care, that participate in Medicare, Medicaid or managed care program needed by the patient, have an available bed and are willing to accept the patient.  Yes   Patient/family informed of [facility]'s ownership interest in [facility] and [facility], as well as of the fact that they are under no obligation to receive care at these facilities.  PASRR submitted to EDS on 12/19/15     PASRR number received on 12/19/15     Existing PASRR number confirmed on       FL2 transmitted to all facilities in geographic area requested by pt/family on 12/20/15     FL2 transmitted to all facilities within larger geographic area on       Patient informed that his/her managed care company has contracts with or will negotiate with certain facilities, including the following:        Yes   Patient/family informed of bed offers received.  Patient chooses bed at  [facility]     Physician recommends and patient chooses bed at  [facility] )    Patient to be transferred to  [facility] ) on 12/22/15.  Patient to be transferred to facility by Non-emergent EMS     Patient family notified on 12/22/15 of transfer.  Name of family member notified:  Amaryllis DykeNorris Bellina     PHYSICIAN       Additional Comment:     _______________________________________________ Judi CongKaren M Matteson Blue, LCSW 12/22/2015, 9:27 AM

## 2015-12-22 NOTE — Progress Notes (Signed)
   Subjective: 3 Days Post-Op Procedure(s) (LRB): TOTAL KNEE ARTHROPLASTY (Right) Patient reports pain as 7 on 0-10 scale.   Patient is well, and has had no acute complaints or problems Continue with  therapy today.  Plan is to go Home after hospital stay. no nausea and no vomiting Patient denies any chest pains or shortness of breath. Objective: Vital signs in last 24 hours: Temp:  [98.2 F (36.8 C)-98.4 F (36.9 C)] 98.2 F (36.8 C) (10/01 0733) Pulse Rate:  [88-94] 88 (10/01 0733) Resp:  [14-18] 14 (10/01 0733) BP: (127-146)/(60-75) 127/75 (10/01 0733) SpO2:  [94 %-95 %] 94 % (10/01 0733) well approximated incision Heels are non tender and elevated off the bed using rolled towels Intake/Output from previous day: 09/30 0701 - 10/01 0700 In: 480 [P.O.:480] Out: 1200 [Urine:1200] Intake/Output this shift: No intake/output data recorded.   Recent Labs  12/20/15 0605  HGB 12.5    Recent Labs  12/20/15 0605  WBC 11.8*  RBC 4.15  HCT 38.1  PLT 264    Recent Labs  12/21/15 0344 12/22/15 0433  NA 133* 133*  K 4.7 4.7  CL 104 102  CO2 28 27  BUN 11 9  CREATININE 0.86 0.79  GLUCOSE 178* 170*  CALCIUM 8.4* 8.6*   No results for input(s): LABPT, INR in the last 72 hours.  EXAM General - Patient is Alert, Appropriate and Oriented Extremity - Neurologically intact Neurovascular intact Sensation intact distally Intact pulses distally Dorsiflexion/Plantar flexion intact Compartment soft Dressing - dressing C/D/I Motor Function - intact, moving foot and toes well on exam.    Past Medical History:  Diagnosis Date  . Anxiety   . Arthritis   . Asthma    when young  . Coronary artery disease   . Depression   . Diabetes mellitus without complication (HCC)   . GERD (gastroesophageal reflux disease)   . History of hiatal hernia   . Hypertension   . Hypothyroidism   . Motion sickness   . Scoliosis   . Shortness of breath dyspnea    doe     Assessment/Plan: 3 Days Post-Op Procedure(s) (LRB): TOTAL KNEE ARTHROPLASTY (Right) Active Problems:   Status post total right knee replacement using cement  Estimated body mass index is 34.76 kg/m as calculated from the following:   Height as of this encounter: 5' (1.524 m).   Weight as of this encounter: 80.7 kg (178 lb). Up with therapy Discharge home with home health once pt has a bowel movement and does steps  Labs: reviewed DVT Prophylaxis - Lovenox, Foot Pumps and TED hose Weight-Bearing as tolerated to left leg Lactulose ordered Tramadol ordered  Needs a bowel movement. Pt states that it usually takes an enema   Kaleiyah Polsky R. [facility] PA [facility] 12/22/2015, 7:40 AM

## 2015-12-22 NOTE — Progress Notes (Addendum)
Patient is being discharged and report called [facility] (pending BM).

## 2015-12-22 NOTE — Clinical Social Work Note (Signed)
[facility] due to pain and inability to bend knee. Patient, her family, and the facility are aware. CSW will con't to follow pending additional dc needs.  Argentina PonderKaren Martha Takeshi Teasdale, MSW, LCSW-A [phone]

## 2015-12-22 NOTE — Progress Notes (Signed)
Fleet enema, soap suds enema, and mag citrate given with no results. Gave patient prune juice with butter with results.

## 2015-12-22 NOTE — Care Management Important Message (Signed)
Important Message  Patient Details  Name: Alexandria Marquez MRN: [id number] Date of Birth: 07/15/1945   Medicare Important Message Given:  Yes    Merel Santoli A, RN 12/22/2015, 3:49 PM

## 2015-12-22 NOTE — Progress Notes (Addendum)
IV removed, belongings packed and EMS called.

## 2016-04-13 ENCOUNTER — Other Ambulatory Visit: Payer: Self-pay | Admitting: Internal Medicine

## 2016-04-13 DIAGNOSIS — Z1231 Encounter for screening mammogram for malignant neoplasm of breast: Secondary | ICD-10-CM

## 2016-04-20 DIAGNOSIS — M792 Neuralgia and neuritis, unspecified: Secondary | ICD-10-CM | POA: Insufficient documentation

## 2016-05-08 ENCOUNTER — Ambulatory Visit
Admission: RE | Admit: 2016-05-08 | Discharge: 2016-05-08 | Disposition: A | Discharge status: Home / Self Care | Payer: Medicare Other | Source: Ambulatory Visit | Attending: Internal Medicine | Admitting: Internal Medicine

## 2016-05-08 ENCOUNTER — Other Ambulatory Visit: Payer: Self-pay | Admitting: Internal Medicine

## 2016-05-08 DIAGNOSIS — Z1231 Encounter for screening mammogram for malignant neoplasm of breast: Secondary | ICD-10-CM | POA: Diagnosis present

## 2016-06-02 ENCOUNTER — Inpatient Hospital Stay
Admission: EM | Admit: 2016-06-02 | Discharge: 2016-06-04 | DRG: 392 | Disposition: A | Discharge status: Home Health | Payer: Medicare Other | Attending: Internal Medicine | Admitting: Internal Medicine

## 2016-06-02 ENCOUNTER — Emergency Department: Payer: Medicare Other

## 2016-06-02 DIAGNOSIS — Z79899 Other long term (current) drug therapy: Secondary | ICD-10-CM

## 2016-06-02 DIAGNOSIS — E039 Hypothyroidism, unspecified: Secondary | ICD-10-CM | POA: Diagnosis present

## 2016-06-02 DIAGNOSIS — K529 Noninfective gastroenteritis and colitis, unspecified: Secondary | ICD-10-CM

## 2016-06-02 DIAGNOSIS — Z96651 Presence of right artificial knee joint: Secondary | ICD-10-CM | POA: Diagnosis present

## 2016-06-02 DIAGNOSIS — E119 Type 2 diabetes mellitus without complications: Secondary | ICD-10-CM | POA: Diagnosis present

## 2016-06-02 DIAGNOSIS — F329 Major depressive disorder, single episode, unspecified: Secondary | ICD-10-CM | POA: Diagnosis present

## 2016-06-02 DIAGNOSIS — K219 Gastro-esophageal reflux disease without esophagitis: Secondary | ICD-10-CM | POA: Diagnosis present

## 2016-06-02 DIAGNOSIS — W19XXXA Unspecified fall, initial encounter: Secondary | ICD-10-CM | POA: Diagnosis present

## 2016-06-02 DIAGNOSIS — Z7722 Contact with and (suspected) exposure to environmental tobacco smoke (acute) (chronic): Secondary | ICD-10-CM | POA: Diagnosis present

## 2016-06-02 DIAGNOSIS — A09 Infectious gastroenteritis and colitis, unspecified: Principal | ICD-10-CM | POA: Diagnosis present

## 2016-06-02 DIAGNOSIS — R55 Syncope and collapse: Secondary | ICD-10-CM | POA: Diagnosis present

## 2016-06-02 DIAGNOSIS — M419 Scoliosis, unspecified: Secondary | ICD-10-CM | POA: Diagnosis present

## 2016-06-02 DIAGNOSIS — S82852A Displaced trimalleolar fracture of left lower leg, initial encounter for closed fracture: Secondary | ICD-10-CM | POA: Diagnosis present

## 2016-06-02 DIAGNOSIS — J45909 Unspecified asthma, uncomplicated: Secondary | ICD-10-CM | POA: Diagnosis present

## 2016-06-02 DIAGNOSIS — Z951 Presence of aortocoronary bypass graft: Secondary | ICD-10-CM | POA: Diagnosis not present

## 2016-06-02 DIAGNOSIS — Z880 Allergy status to penicillin: Secondary | ICD-10-CM

## 2016-06-02 DIAGNOSIS — I10 Essential (primary) hypertension: Secondary | ICD-10-CM | POA: Diagnosis present

## 2016-06-02 DIAGNOSIS — M1990 Unspecified osteoarthritis, unspecified site: Secondary | ICD-10-CM | POA: Diagnosis present

## 2016-06-02 DIAGNOSIS — F419 Anxiety disorder, unspecified: Secondary | ICD-10-CM | POA: Diagnosis present

## 2016-06-02 DIAGNOSIS — Z7984 Long term (current) use of oral hypoglycemic drugs: Secondary | ICD-10-CM

## 2016-06-02 DIAGNOSIS — Z885 Allergy status to narcotic agent status: Secondary | ICD-10-CM

## 2016-06-02 DIAGNOSIS — E860 Dehydration: Secondary | ICD-10-CM | POA: Diagnosis present

## 2016-06-02 DIAGNOSIS — Z981 Arthrodesis status: Secondary | ICD-10-CM

## 2016-06-02 DIAGNOSIS — Y92009 Unspecified place in unspecified non-institutional (private) residence as the place of occurrence of the external cause: Secondary | ICD-10-CM

## 2016-06-02 DIAGNOSIS — N179 Acute kidney failure, unspecified: Secondary | ICD-10-CM | POA: Diagnosis present

## 2016-06-02 DIAGNOSIS — I2510 Atherosclerotic heart disease of native coronary artery without angina pectoris: Secondary | ICD-10-CM | POA: Diagnosis present

## 2016-06-02 DIAGNOSIS — Z794 Long term (current) use of insulin: Secondary | ICD-10-CM

## 2016-06-02 DIAGNOSIS — Z7982 Long term (current) use of aspirin: Secondary | ICD-10-CM | POA: Diagnosis not present

## 2016-06-02 LAB — URINALYSIS, COMPLETE (UACMP) WITH MICROSCOPIC
Bilirubin Urine: NEGATIVE
Glucose, UA: >=500 mg/dL — AB
Ketones, ur: NEGATIVE mg/dL
Leukocytes, UA: NEGATIVE
Nitrite: NEGATIVE
Protein, ur: NEGATIVE mg/dL
Specific Gravity, Urine: 1.025 (ref 1.005–1.030)
pH: 5 (ref 5.0–8.0)

## 2016-06-02 LAB — COMPREHENSIVE METABOLIC PANEL
ALT: 13 U/L — ABNORMAL LOW (ref 14–54)
AST: 36 U/L (ref 15–41)
Albumin: 3.6 g/dL (ref 3.5–5.0)
Alkaline Phosphatase: 54 U/L (ref 38–126)
Anion gap: 12 (ref 5–15)
BUN: 21 mg/dL — ABNORMAL HIGH (ref 6–20)
CO2: 22 mmol/L (ref 22–32)
Calcium: 8.9 mg/dL (ref 8.9–10.3)
Chloride: 99 mmol/L — ABNORMAL LOW (ref 101–111)
Creatinine, Ser: 1.19 mg/dL — ABNORMAL HIGH (ref 0.44–1.00)
GFR calc Af Amer: 52 mL/min — ABNORMAL LOW (ref >60)
GFR calc non Af Amer: 45 mL/min — ABNORMAL LOW (ref >60)
Glucose, Bld: 291 mg/dL — ABNORMAL HIGH (ref 65–99)
Potassium: 4.5 mmol/L (ref 3.5–5.1)
Sodium: 133 mmol/L — ABNORMAL LOW (ref 135–145)
Total Bilirubin: 1.2 mg/dL (ref 0.3–1.2)
Total Protein: 6.6 g/dL (ref 6.5–8.1)

## 2016-06-02 LAB — TROPONIN I
Troponin I: 0.03 ng/mL (ref <0.03)
Troponin I: 0.03 ng/mL (ref <0.03)

## 2016-06-02 LAB — BASIC METABOLIC PANEL
Anion gap: 9 (ref 5–15)
BUN: 21 mg/dL — ABNORMAL HIGH (ref 6–20)
CO2: 24 mmol/L (ref 22–32)
Calcium: 8.8 mg/dL — ABNORMAL LOW (ref 8.9–10.3)
Chloride: 100 mmol/L — ABNORMAL LOW (ref 101–111)
Creatinine, Ser: 0.87 mg/dL (ref 0.44–1.00)
GFR calc Af Amer: >60 mL/min (ref >60)
GFR calc non Af Amer: >60 mL/min (ref >60)
Glucose, Bld: 224 mg/dL — ABNORMAL HIGH (ref 65–99)
Potassium: 4.5 mmol/L (ref 3.5–5.1)
Sodium: 133 mmol/L — ABNORMAL LOW (ref 135–145)

## 2016-06-02 LAB — MAGNESIUM: Magnesium: 1.4 mg/dL — ABNORMAL LOW (ref 1.7–2.4)

## 2016-06-02 LAB — CBC
HCT: 49.1 % — ABNORMAL HIGH (ref 35.0–47.0)
Hemoglobin: 16.1 g/dL — ABNORMAL HIGH (ref 12.0–16.0)
MCH: 29.4 pg (ref 26.0–34.0)
MCHC: 32.8 g/dL (ref 32.0–36.0)
MCV: 89.8 fL (ref 80.0–100.0)
Platelets: 440 10*3/uL (ref 150–440)
RBC: 5.46 MIL/uL — ABNORMAL HIGH (ref 3.80–5.20)
RDW: 14.6 % — ABNORMAL HIGH (ref 11.5–14.5)
WBC: 33.7 10*3/uL — ABNORMAL HIGH (ref 3.6–11.0)

## 2016-06-02 LAB — LIPASE, BLOOD: Lipase: 15 U/L (ref 11–51)

## 2016-06-02 LAB — GLUCOSE, CAPILLARY: Glucose-Capillary: 168 mg/dL — ABNORMAL HIGH (ref 65–99)

## 2016-06-02 MED ORDER — METRONIDAZOLE IN NACL 5-0.79 MG/ML-% IV SOLN
500.0000 mg | Freq: Once | INTRAVENOUS | Status: DC
  Filled 2016-06-02: qty 100

## 2016-06-02 MED ORDER — INSULIN ASPART 100 UNIT/ML ~~LOC~~ SOLN
0.0000 [IU] | Freq: Three times a day (TID) | SUBCUTANEOUS | Status: DC
  Administered 2016-06-03: 2 [IU] via SUBCUTANEOUS
  Administered 2016-06-04: 1 [IU] via SUBCUTANEOUS
  Administered 2016-06-04: 2 [IU] via SUBCUTANEOUS
  Filled 2016-06-02: qty 2
  Filled 2016-06-02: qty 1

## 2016-06-02 MED ORDER — SODIUM CHLORIDE 0.9% FLUSH
3.0000 mL | Freq: Two times a day (BID) | INTRAVENOUS | Status: DC
  Administered 2016-06-02: 3 mL via INTRAVENOUS

## 2016-06-02 MED ORDER — IOPAMIDOL (ISOVUE-300) INJECTION 61%
75.0000 mL | Freq: Once | INTRAVENOUS | Status: AC | PRN
  Administered 2016-06-02: 75 mL via INTRAVENOUS

## 2016-06-02 MED ORDER — ENALAPRIL MALEATE 20 MG PO TABS
20.0000 mg | ORAL_TABLET | Freq: Two times a day (BID) | ORAL | Status: DC
  Administered 2016-06-02 – 2016-06-04 (×4): 20 mg via ORAL
  Filled 2016-06-02 (×2): qty 1
  Filled 2016-06-02 (×3): qty 4

## 2016-06-02 MED ORDER — ONDANSETRON HCL 4 MG PO TABS: 4.0000 mg | ORAL_TABLET | Freq: Three times a day (TID) | ORAL | 0 refills | Status: DC | PRN

## 2016-06-02 MED ORDER — MAGNESIUM SULFATE 2 GM/50ML IV SOLN
2.0000 g | Freq: Once | INTRAVENOUS | Status: AC
  Administered 2016-06-02: 2 g via INTRAVENOUS
  Filled 2016-06-02: qty 50

## 2016-06-02 MED ORDER — OXYCODONE-ACETAMINOPHEN 5-325 MG PO TABS: 1.0000 | ORAL_TABLET | Freq: Four times a day (QID) | ORAL | 0 refills | Status: DC | PRN

## 2016-06-02 MED ORDER — ACETAMINOPHEN 650 MG RE SUPP: 650.0000 mg | Freq: Four times a day (QID) | RECTAL | Status: DC | PRN

## 2016-06-02 MED ORDER — METRONIDAZOLE 500 MG PO TABS: 500.0000 mg | ORAL_TABLET | Freq: Three times a day (TID) | ORAL | 0 refills | Status: DC

## 2016-06-02 MED ORDER — ACETAMINOPHEN 325 MG PO TABS: 650.0000 mg | ORAL_TABLET | Freq: Four times a day (QID) | ORAL | Status: DC | PRN

## 2016-06-02 MED ORDER — PANTOPRAZOLE SODIUM 40 MG PO TBEC
40.0000 mg | DELAYED_RELEASE_TABLET | Freq: Two times a day (BID) | ORAL | Status: DC
  Administered 2016-06-02 – 2016-06-04 (×4): 40 mg via ORAL
  Filled 2016-06-02 (×4): qty 1

## 2016-06-02 MED ORDER — INSULIN ASPART 100 UNIT/ML ~~LOC~~ SOLN: 0.0000 [IU] | Freq: Every day | SUBCUTANEOUS | Status: DC

## 2016-06-02 MED ORDER — MELOXICAM 7.5 MG PO TABS
7.5000 mg | ORAL_TABLET | Freq: Two times a day (BID) | ORAL | Status: DC
  Administered 2016-06-03 – 2016-06-04 (×3): 7.5 mg via ORAL
  Filled 2016-06-02 (×3): qty 1

## 2016-06-02 MED ORDER — HYDROCODONE-ACETAMINOPHEN 5-325 MG PO TABS
1.0000 | ORAL_TABLET | ORAL | Status: DC | PRN
  Administered 2016-06-03 – 2016-06-04 (×5): 2 via ORAL
  Filled 2016-06-02 (×5): qty 2

## 2016-06-02 MED ORDER — CIPROFLOXACIN HCL 500 MG PO TABS: 500.0000 mg | ORAL_TABLET | Freq: Two times a day (BID) | ORAL | 0 refills | Status: DC

## 2016-06-02 MED ORDER — FLUTICASONE PROPIONATE 50 MCG/ACT NA SUSP
2.0000 | Freq: Every day | NASAL | Status: DC
  Administered 2016-06-03 – 2016-06-04 (×2): 2 via NASAL
  Filled 2016-06-02: qty 16

## 2016-06-02 MED ORDER — SIMVASTATIN 20 MG PO TABS
40.0000 mg | ORAL_TABLET | Freq: Every day | ORAL | Status: DC
  Administered 2016-06-03: 40 mg via ORAL
  Filled 2016-06-02: qty 2

## 2016-06-02 MED ORDER — CEFTRIAXONE SODIUM-DEXTROSE 1-3.74 GM-% IV SOLR
1.0000 g | Freq: Once | INTRAVENOUS | Status: AC
  Administered 2016-06-02: 1 g via INTRAVENOUS
  Filled 2016-06-02: qty 50

## 2016-06-02 MED ORDER — ONDANSETRON HCL 4 MG/2ML IJ SOLN: 4.0000 mg | Freq: Four times a day (QID) | INTRAMUSCULAR | Status: DC | PRN

## 2016-06-02 MED ORDER — ONDANSETRON HCL 4 MG PO TABS: 4.0000 mg | ORAL_TABLET | Freq: Four times a day (QID) | ORAL | Status: DC | PRN

## 2016-06-02 MED ORDER — GLIMEPIRIDE 2 MG PO TABS
4.0000 mg | ORAL_TABLET | Freq: Two times a day (BID) | ORAL | Status: DC
  Administered 2016-06-03 – 2016-06-04 (×3): 4 mg via ORAL
  Filled 2016-06-02 (×3): qty 2

## 2016-06-02 MED ORDER — SODIUM CHLORIDE 0.9 % IV BOLUS (SEPSIS)
1000.0000 mL | Freq: Once | INTRAVENOUS | Status: AC
  Administered 2016-06-02: 1000 mL via INTRAVENOUS

## 2016-06-02 MED ORDER — CIPROFLOXACIN IN D5W 400 MG/200ML IV SOLN
400.0000 mg | Freq: Two times a day (BID) | INTRAVENOUS | Status: DC
  Administered 2016-06-03: 400 mg via INTRAVENOUS
  Filled 2016-06-02 (×2): qty 200

## 2016-06-02 MED ORDER — METRONIDAZOLE 500 MG PO TABS
500.0000 mg | ORAL_TABLET | Freq: Three times a day (TID) | ORAL | Status: DC
  Administered 2016-06-02 – 2016-06-04 (×6): 500 mg via ORAL
  Filled 2016-06-02 (×6): qty 1

## 2016-06-02 MED ORDER — ACETAMINOPHEN 500 MG PO TABS
1000.0000 mg | ORAL_TABLET | Freq: Once | ORAL | Status: AC
  Administered 2016-06-02: 1000 mg via ORAL
  Filled 2016-06-02: qty 2

## 2016-06-02 MED ORDER — DEXTROSE 5 % IV SOLN: 1.0000 g | Freq: Once | INTRAVENOUS | Status: DC

## 2016-06-02 MED ORDER — ASPIRIN EC 81 MG PO TBEC
81.0000 mg | DELAYED_RELEASE_TABLET | Freq: Every day | ORAL | Status: DC
  Administered 2016-06-03 – 2016-06-04 (×2): 81 mg via ORAL
  Filled 2016-06-02 (×2): qty 1

## 2016-06-02 MED ORDER — LEVOTHYROXINE SODIUM 75 MCG PO TABS
75.0000 ug | ORAL_TABLET | Freq: Every day | ORAL | Status: DC
  Administered 2016-06-03 – 2016-06-04 (×2): 75 ug via ORAL
  Filled 2016-06-02 (×2): qty 1

## 2016-06-02 MED ORDER — ENOXAPARIN SODIUM 40 MG/0.4ML ~~LOC~~ SOLN
40.0000 mg | SUBCUTANEOUS | Status: DC
  Administered 2016-06-02 – 2016-06-03 (×2): 40 mg via SUBCUTANEOUS
  Filled 2016-06-02 (×2): qty 0.4

## 2016-06-02 MED ORDER — IOPAMIDOL (ISOVUE-300) INJECTION 61%: 30.0000 mL | Freq: Once | INTRAVENOUS | Status: DC | PRN

## 2016-06-02 MED ORDER — PNEUMOCOCCAL VAC POLYVALENT 25 MCG/0.5ML IJ INJ: 0.5000 mL | INJECTION | INTRAMUSCULAR | Status: DC

## 2016-06-02 MED ORDER — POTASSIUM CHLORIDE IN NACL 20-0.9 MEQ/L-% IV SOLN
INTRAVENOUS | Status: DC
  Administered 2016-06-02: 1000 mL via INTRAVENOUS
  Administered 2016-06-03: 08:00:00 via INTRAVENOUS
  Filled 2016-06-02 (×4): qty 1000

## 2016-06-02 MED ORDER — DULOXETINE HCL 60 MG PO CPEP
60.0000 mg | ORAL_CAPSULE | Freq: Every day | ORAL | Status: DC
  Administered 2016-06-03: 60 mg via ORAL
  Filled 2016-06-02 (×3): qty 1

## 2016-06-02 MED ORDER — HYDRALAZINE HCL 20 MG/ML IJ SOLN
10.0000 mg | Freq: Four times a day (QID) | INTRAMUSCULAR | Status: DC | PRN
  Administered 2016-06-02: 10 mg via INTRAVENOUS
  Filled 2016-06-02: qty 1

## 2016-06-02 MED ORDER — METRONIDAZOLE 500 MG PO TABS
500.0000 mg | ORAL_TABLET | Freq: Once | ORAL | Status: AC
  Administered 2016-06-02: 500 mg via ORAL
  Filled 2016-06-02: qty 1

## 2016-06-02 MED ORDER — MORPHINE SULFATE (PF) 2 MG/ML IV SOLN: 2.0000 mg | INTRAVENOUS | Status: DC | PRN

## 2016-06-02 MED ORDER — GABAPENTIN 600 MG PO TABS
600.0000 mg | ORAL_TABLET | Freq: Every day | ORAL | Status: DC
  Administered 2016-06-02 – 2016-06-03 (×2): 600 mg via ORAL
  Filled 2016-06-02 (×3): qty 1

## 2016-06-02 NOTE — Consult Note (Signed)
Called by Dr. Don PerkingVeronese to look at patient's xrays.  Patient with trimalleolar fracture.  Lateral malleolus translated and angulated.  No widening of syndesmosis.  Mortise relatively symmetric.  Soft tissue swelling seen.  Recommend patient be placed in AO splint.  Follow up in my office for further evaluation.  Patient likely will require surgery after swelling has subsided.

## 2016-06-02 NOTE — Discharge Instructions (Signed)
Drink plenty of fluids to keep yourself hydrated. Follow up with your doctor in 1-2 days to have your kidney function rechecked and vital signs. Take Zofran as needed for nausea and vomiting. Take antibiotics as prescribed. Return to the emergency room if you have worsening abdominal pain, fever, you feel dizzy, she develop any new symptoms that are not present today, or if you're concerned you are getting dehydrated.  For your ankle fracture, use crutches, do not weight bear until cleared by ortho. You will need surgery in one week. Call Dr. Samuel GermanyKrasinski's office tomorrow for close f/u. Return to the ER if you have worsening pain, discoloration of your toes or pins and needles sensation.

## 2016-06-02 NOTE — H&P (Signed)
[facility] [place] [facility]   PATIENT NAME: Alexandria Marquez    MR#:  [id number]  DATE OF BIRTH:  12-May-1945  DATE OF ADMISSION:  06/02/2016  PRIMARY CARE PHYSICIAN: Leotis Shames, MD   REQUESTING/REFERRING PHYSICIAN: Dr. Don Perking  CHIEF COMPLAINT:   Chief Complaint  Patient presents with  . Nausea  . Emesis   HISTORY OF PRESENT ILLNESS:  Alexandria Marquez  is a 71 y.o. female with a known history of DM, HTN, CAD here with Nausea, vomiting and diarrhea since afternoon. She passed out on th toilet and twisted her ankle. She has a left ankle fracture. Here she has been found to have enteritis on Ct abd, dehdyartion, WBC 33K.  PAST MEDICAL HISTORY:   Past Medical History:  Diagnosis Date  . Anxiety   . Arthritis   . Asthma    when young  . Coronary artery disease   . Depression   . Diabetes mellitus without complication (HCC)   . GERD (gastroesophageal reflux disease)   . History of hiatal hernia   . Hypertension   . Hypothyroidism   . Motion sickness   . Scoliosis   . Shortness of breath dyspnea    doe    PAST SURGICAL HISTORY:   Past Surgical History:  Procedure Laterality Date  . ABDOMINAL HYSTERECTOMY    . APPENDECTOMY    . BACK SURGERY     lumbar fusion/plate/rods/screws  . CHOLECYSTECTOMY    . CORONARY ARTERY BYPASS GRAFT     2008  . HERNIA REPAIR    . rcr    . TONSILLECTOMY    . TOTAL KNEE ARTHROPLASTY Right 12/19/2015   Procedure: TOTAL KNEE ARTHROPLASTY;  Surgeon: Christena Flake, MD;  Location: [facility] ORS;  Service: Orthopedics;  Laterality: Right;  . TUBAL LIGATION      SOCIAL HISTORY:   Social History  Substance Use Topics  . Smoking status: Passive Smoke Exposure - Never Smoker  . Smokeless tobacco: Never Used  . Alcohol use No    FAMILY HISTORY:   Family History  Problem Relation Age of Onset  . Breast cancer Maternal Aunt 30    DRUG ALLERGIES:   Allergies  Allergen Reactions  . Morphine And Related Nausea And Vomiting   . Penicillins Hives    Has patient had a PCN reaction causing immediate rash, facial/tongue/throat swelling, SOB or lightheadedness with hypotension: No Has patient had a PCN reaction causing severe rash involving mucus membranes or skin necrosis: No Has patient had a PCN reaction that required hospitalization No Has patient had a PCN reaction occurring within the last 10 years: No If all of the above answers are "NO", then may proceed with Cephalosporin use.     REVIEW OF SYSTEMS:   Review of Systems  Constitutional: Positive for malaise/fatigue. Negative for chills, fever and weight loss.  HENT: Negative for hearing loss and nosebleeds.   Eyes: Negative for blurred vision, double vision and pain.  Respiratory: Negative for cough, hemoptysis, sputum production, shortness of breath and wheezing.   Cardiovascular: Negative for chest pain, palpitations, orthopnea and leg swelling.  Gastrointestinal: Positive for abdominal pain, diarrhea, nausea and vomiting. Negative for constipation.  Genitourinary: Negative for dysuria and hematuria.  Musculoskeletal: Positive for falls and joint pain. Negative for back pain and myalgias.  Skin: Negative for rash.  Neurological: Positive for dizziness, loss of consciousness and weakness. Negative for tremors, sensory change, speech change, focal weakness, seizures and headaches.  Endo/Heme/Allergies: Does not bruise/bleed easily.  Psychiatric/Behavioral: Negative for depression and memory loss. The patient is not nervous/anxious.     MEDICATIONS AT HOME:   Prior to Admission medications   Medication Sig Start Date End Date Taking? Authorizing Provider  aspirin EC 81 MG tablet Take 81 mg by mouth daily.   Yes Historical Provider, MD  DULoxetine (CYMBALTA) 60 MG capsule Take 60 mg by mouth daily.   Yes Historical Provider, MD  enalapril (VASOTEC) 20 MG tablet Take 20 mg by mouth 2 (two) times daily.   Yes Historical Provider, MD  fluticasone  (FLONASE) 50 MCG/ACT nasal spray Place 2 sprays into both nostrils daily.   Yes Historical Provider, MD  gabapentin (NEURONTIN) 600 MG tablet Take 600 mg by mouth at bedtime.   Yes Historical Provider, MD  glimepiride (AMARYL) 4 MG tablet Take 4 mg by mouth 2 (two) times daily.   Yes Historical Provider, MD  levothyroxine (SYNTHROID, LEVOTHROID) 75 MCG tablet Take 75 mcg by mouth daily before breakfast.   Yes Historical Provider, MD  meloxicam (MOBIC) 7.5 MG tablet Take 7.5 mg by mouth 2 (two) times daily.   Yes Historical Provider, MD  metFORMIN (GLUCOPHAGE) 1000 MG tablet Take 1,000 mg by mouth 2 (two) times daily with a meal.   Yes Historical Provider, MD  pantoprazole (PROTONIX) 40 MG tablet Take 40 mg by mouth 2 (two) times daily.   Yes Historical Provider, MD  pioglitazone (ACTOS) 45 MG tablet Take 45 mg by mouth daily.    Yes Historical Provider, MD  simvastatin (ZOCOR) 40 MG tablet Take 40 mg by mouth daily at 6 PM.   Yes Historical Provider, MD  ciprofloxacin (CIPRO) 500 MG tablet Take 1 tablet (500 mg total) by mouth 2 (two) times daily. 06/02/16 06/12/16  Nita Sicklearolina Veronese, MD  enoxaparin (LOVENOX) 40 MG/0.4ML injection Inject 0.4 mLs (40 mg total) into the skin daily. Patient not taking: Reported on 06/02/2016 12/21/15   Dedra Skeensodd Mundy, PA-C  metroNIDAZOLE (FLAGYL) 500 MG tablet Take 1 tablet (500 mg total) by mouth 3 (three) times daily. 06/02/16 06/12/16  Nita Sicklearolina Veronese, MD  ondansetron (ZOFRAN) 4 MG tablet Take 1 tablet (4 mg total) by mouth every 8 (eight) hours as needed for nausea or vomiting. 06/02/16   Nita Sicklearolina Veronese, MD  oxyCODONE (OXY IR/ROXICODONE) 5 MG immediate release tablet Take 1-2 tablets (5-10 mg total) by mouth every 4 (four) hours as needed for breakthrough pain. Patient not taking: Reported on 06/02/2016 12/21/15   Tera PartridgeJon R Wolfe, PA  oxyCODONE-acetaminophen (ROXICET) 5-325 MG tablet Take 1 tablet by mouth every 6 (six) hours as needed. 06/02/16 06/02/17  Nita Sicklearolina Veronese, MD   traMADol (ULTRAM) 50 MG tablet Take 1-2 tablets (50-100 mg total) by mouth every 4 (four) hours as needed for moderate pain. Patient not taking: Reported on 06/02/2016 12/22/15   Tera PartridgeJon R Wolfe, PA     VITAL SIGNS:  Blood pressure (!) 179/68, pulse 65, temperature 97.5 F (36.4 C), temperature source Axillary, resp. rate 14, height 5' (1.524 m), weight 78.5 kg (173 lb), SpO2 99 %.  PHYSICAL EXAMINATION:  Physical Exam  GENERAL:  71 y.o.-year-old patient lying in the bed with no acute distress.  EYES: Pupils equal, round, reactive to light and accommodation. No scleral icterus. Extraocular muscles intact.  HEENT: Head atraumatic, normocephalic. Oropharynx and nasopharynx clear. No oropharyngeal erythema, moist oral mucosa  NECK:  Supple, no jugular venous distention. No thyroid enlargement, no tenderness.  LUNGS: Normal breath sounds bilaterally, no wheezing, rales, rhonchi. No use of accessory  muscles of respiration.  CARDIOVASCULAR: S1, S2 normal. No murmurs, rubs, or gallops.  ABDOMEN: Soft, nondistended. Bowel sounds present. No organomegaly or mass. Right tenderness EXTREMITIES: No pedal edema, cyanosis, or clubbing. + 2 pedal & radial pulses b/l.   Left ankle splint NEUROLOGIC: Cranial nerves II through XII are intact. No focal Motor or sensory deficits appreciated b/l PSYCHIATRIC: The patient is alert and oriented x 3. Good affect.  SKIN: No obvious rash, lesion, or ulcer.   LABORATORY PANEL:   CBC  Recent Labs Lab 06/02/16 1547  WBC 33.7*  HGB 16.1*  HCT 49.1*  PLT 440   ------------------------------------------------------------------------------------------------------------------  Chemistries   Recent Labs Lab 06/02/16 1547 06/02/16 2035  NA 133* 133*  K 4.5 4.5  CL 99* 100*  CO2 22 24  GLUCOSE 291* 224*  BUN 21* 21*  CREATININE 1.19* 0.87  CALCIUM 8.9 8.8*  MG 1.4*  --   AST 36  --   ALT 13*  --   ALKPHOS 54  --   BILITOT 1.2  --     ------------------------------------------------------------------------------------------------------------------  Cardiac Enzymes  Recent Labs Lab 06/02/16 2035  TROPONINI 0.03*   ------------------------------------------------------------------------------------------------------------------  RADIOLOGY:  Dg Ankle Complete Left  Result Date: 06/02/2016 CLINICAL DATA:  Fall. EXAM: LEFT ANKLE COMPLETE - 3+ VIEW COMPARISON:  No recent. FINDINGS: Slight displaced fracture of the medial malleolus noted. Displaced angulated fracture of the distal fibula. Slight displaced fracture of the posterior malleolus. Disruption of the tibial talar joint . IMPRESSION: Fractures of the medial and posterior malleolus. Angulated displaced fracture of the distal fibula. Disruption of the tibial talar joint. Electronically Signed   By: Maisie Fus  Register   On: 06/02/2016 16:43   Ct Head Wo Contrast  Result Date: 06/02/2016 CLINICAL DATA:  Nausea, vomiting, and diarrhea since noon today, fell for from toilet run on reason, denies loss of consciousness, chest discomfort, history CABG, back pain post fall, diabetes mellitus, hypertension EXAM: CT HEAD WITHOUT CONTRAST TECHNIQUE: Contiguous axial images were obtained from the base of the skull through the vertex without intravenous contrast. Sagittal and coronal MPR images reconstructed from axial data set. COMPARISON:  None FINDINGS: Brain: Generalized atrophy of cerebral hemispheres and cerebellum. Normal ventricular morphology. No midline shift or mass effect. No intracranial hemorrhage, mass lesion or evidence acute infarction. No extra-axial fluid collections. Vascular: Mild atherosclerotic calcification at the carotid siphons Skull: Intact though mildly demineralized Sinuses/Orbits: Clear Other: N/A IMPRESSION: Generalized atrophy. No acute intracranial abnormalities. Electronically Signed   By: Ulyses Southward M.D.   On: 06/02/2016 16:29   Ct Abdomen Pelvis W  Contrast  Result Date: 06/02/2016 CLINICAL DATA:  71 year old female with abdominal pain nausea vomiting diarrhea and leukocytosis. Status post fall today. EXAM: CT ABDOMEN AND PELVIS WITH CONTRAST TECHNIQUE: Multidetector CT imaging of the abdomen and pelvis was performed using the standard protocol following bolus administration of intravenous contrast. CONTRAST:  75mL ISOVUE-300 IOPAMIDOL (ISOVUE-300) INJECTION 61% COMPARISON:  Lumbar MRI 10/01/2014. CT Abdomen and Pelvis 08/01/2012 and earlier. FINDINGS: Lower chest: Lower lung volumes today compared to 2014. Calcified granulomas in the left lower lobe. Mild peribronchial and dependent atelectasis. No pleural effusion. No suspicious lung base opacity. Cardiomegaly. No pericardial effusion. Hepatobiliary: Chronic surgically absent gallbladder. Negative liver aside from a small volume of free fluid adjacent to the dome (series 3, image 8). Intra and extrahepatic biliary ducts appear stable since 2014. Pancreas: Fatty atrophied, stable since 2014. Spleen: Negative.  No adjacent free fluid. Adrenals/Urinary Tract: Normal adrenal glands. Bilateral renal  enhancement and contrast excretion is within normal limits. Kidneys appear stable since 2014. No hydroureter. Decompressed and unremarkable urinary bladder. Multiple pelvic phleboliths. Stomach/Bowel: Decompressed and negative rectum. Redundant sigmoid colon with moderate diverticulosis of the proximal sigmoid continuing into the distal descending colon. No definite active inflammation. There is a trace amount of free fluid adjacent to the distal sigmoid. The left colon is decompressed with mild diverticulosis also at the splenic flexure. No active inflammation. The transverse colon and right colon are decompressed. There is a small volume of free fluid about the cecum. Appendix appears to be diminutive or absent. Negative terminal ileum. No dilated small bowel. Questionable widespread small bowel mucosal hyper  enhancement, and there does appear to be mild mesenteric stranding associated with left lateral abdominal small bowel loops (coronal images 30 through 37) where there is also a small volume of free fluid. Mild fluid distension of the stomach and distal esophagus with small sliding-type hiatal hernia suspected. Suggestion of mild wall where there is thickening in the duodenum mild adjacent inflammatory stranding (series 3, image 33). No pneumoperitoneum. There is rectus muscle diastases in the midline of the lower abdomen but no bona fide or incarcerated hernia. No other mesenteric stranding identified. Vascular/Lymphatic: Aortoiliac calcified atherosclerosis noted. Major arterial structures are patent. Portal venous system is patent. New line no lymphadenopathy. Reproductive: Surgically absent as in 2014. Other: Small volume of simple density free fluid in the pelvis (series 3, image 64). Musculoskeletal: Osteopenia. Previous lower lumbar spine fusion changes. The hardware appears stable since 2014, as does a chronic postoperative fluid collection at the laminectomy space (probably a seroma). Partially visible previous median sternotomy. No lower rib fracture identified. Pelvis appears stable and intact. Chronic pubic symphysis degeneration. No acute osseous abnormality identified. IMPRESSION: 1. Suspicion of generalized small bowel inflammation, most apparent in the proximal duodenum and loops of jejunum in the left lateral abdomen. Favor infectious enteritis in this clinical setting. No dilated bowel loops. No definite colonic inflammation, although there is a small volume of free fluid adjacent to the cecum. Superimposed intermittent large bowel diverticulosis 2. Small volume of free fluid in the abdomen and pelvis is nonspecific but favored due to #1. No free air. 3. No other inflammatory process identified. 4. Calcified aortic atherosclerosis. Mild cardiomegaly and lung base atelectasis. Chronic postoperative  changes to the lumbar spine appears stable since 2014. Electronically Signed   By: Odessa Fleming M.D.   On: 06/02/2016 17:36     IMPRESSION AND PLAN:   * Enteritis Start cipro and falgyl Check Stool PCR and C diff  * Left ankle fracture Seen by orthopedics Dr. Martha Clan in the emergency room. Needs outpatient follow-up in 1 week for surgery. Nonweightbearing. Consult physical therapy. Pain medications added.  * Dehydration due to vomiting and diarrhea. Start IV fluids.  * Hypomagnesemia. Replace through IV.  * Syncope due to severe dehydration. Troponin normal. Continue IV fluids. Monitor in telemetry overnight.  * Diabetes mellitus. Continue glipizide. Hold Actos and metformin. Sliding scale insulin.  * Hypertension. Continue medications.  * DVT prophylaxis with Lovenox.  All the records are reviewed and case discussed with ED provider. Management plans discussed with the patient, family and they are in agreement.  CODE STATUS: FULL CODE  TOTAL TIME TAKING CARE OF THIS PATIENT: 40 minutes.   Milagros Loll R M.D on 06/02/2016 at 9:26 PM  Between 7am to 6pm - Pager - [phone]  After 6pm go to [email or web address] - password [email or web address]  [facility] [place]  [facility]  Office  [phone]  CC: Primary care physician; Glendon Axe, MD  Note: This dictation was prepared with Dragon dictation along with smaller phrase technology. Any transcriptional errors that result from this process are unintentional.

## 2016-06-02 NOTE — ED Provider Notes (Signed)
[facility] Emergency Department Provider Note  ____________________________________________  Time seen: Approximately 4:11 PM  I have reviewed the triage vital signs and the nursing notes.   HISTORY  Chief Complaint Nausea and Emesis   HPI Alexandria Marquez is a 71 y.o. female a history of CAD, diabetes, hypertension, hypothyroidism who presents for evaluation of nausea, vomiting, diarrhea. Patient reports that she woke up early this morning with significant nausea and had 2 episodes of nonbloody nonbilious emesis. She then started having watery diarrhea and reports several episodes since earlier this morning. She was having a bowel movement while sitting on the toilet when she passed out. She found herself on the ground and is complaining of pain after twisting her left ankle. She also noticed significant swelling to the ankle. The pain is mild at rest and becomes severe with any movement or palpation of the lateral aspect of the ankle, constant, nonradiating since the fall. Patient denies head trauma. She denies neck or back pain, chest pain. She is also complaining of moderate cramping diffuse abdominal pain that started together with the vomiting and diarrhea. Patient has not received any recent antibiotics. She is not on any blood thinners. She denies fever but has had chills. No cough or congestion.No melena, no hematemesis, no coffee-ground emesis, no hematochezia.  Past Medical History:  Diagnosis Date  . Anxiety   . Arthritis   . Asthma    when young  . Coronary artery disease   . Depression   . Diabetes mellitus without complication (HCC)   . GERD (gastroesophageal reflux disease)   . History of hiatal hernia   . Hypertension   . Hypothyroidism   . Motion sickness   . Scoliosis   . Shortness of breath dyspnea    doe    Patient Active Problem List   Diagnosis Date Noted  . Status post total right knee replacement using cement 12/19/2015     Past Surgical History:  Procedure Laterality Date  . ABDOMINAL HYSTERECTOMY    . APPENDECTOMY    . BACK SURGERY     lumbar fusion/plate/rods/screws  . CHOLECYSTECTOMY    . CORONARY ARTERY BYPASS GRAFT     2008  . HERNIA REPAIR    . rcr    . TONSILLECTOMY    . TOTAL KNEE ARTHROPLASTY Right 12/19/2015   Procedure: TOTAL KNEE ARTHROPLASTY;  Surgeon: Christena Flake, MD;  Location: [facility] ORS;  Service: Orthopedics;  Laterality: Right;  . TUBAL LIGATION      Prior to Admission medications   Medication Sig Start Date End Date Taking? Authorizing Provider  aspirin EC 81 MG tablet Take 81 mg by mouth daily.   Yes Historical Provider, MD  DULoxetine (CYMBALTA) 60 MG capsule Take 60 mg by mouth daily.   Yes Historical Provider, MD  enalapril (VASOTEC) 20 MG tablet Take 20 mg by mouth 2 (two) times daily.   Yes Historical Provider, MD  fluticasone (FLONASE) 50 MCG/ACT nasal spray Place 2 sprays into both nostrils daily.   Yes Historical Provider, MD  gabapentin (NEURONTIN) 600 MG tablet Take 600 mg by mouth at bedtime.   Yes Historical Provider, MD  glimepiride (AMARYL) 4 MG tablet Take 4 mg by mouth 2 (two) times daily.   Yes Historical Provider, MD  levothyroxine (SYNTHROID, LEVOTHROID) 75 MCG tablet Take 75 mcg by mouth daily before breakfast.   Yes Historical Provider, MD  meloxicam (MOBIC) 7.5 MG tablet Take 7.5 mg by mouth 2 (two) times  daily.   Yes Historical Provider, MD  metFORMIN (GLUCOPHAGE) 1000 MG tablet Take 1,000 mg by mouth 2 (two) times daily with a meal.   Yes Historical Provider, MD  pantoprazole (PROTONIX) 40 MG tablet Take 40 mg by mouth 2 (two) times daily.   Yes Historical Provider, MD  pioglitazone (ACTOS) 45 MG tablet Take 45 mg by mouth daily.    Yes Historical Provider, MD  simvastatin (ZOCOR) 40 MG tablet Take 40 mg by mouth daily at 6 PM.   Yes Historical Provider, MD  ciprofloxacin (CIPRO) 500 MG tablet Take 1 tablet (500 mg total) by mouth 2 (two) times daily.  06/02/16 06/12/16  Nita Sickle, MD  enoxaparin (LOVENOX) 40 MG/0.4ML injection Inject 0.4 mLs (40 mg total) into the skin daily. Patient not taking: Reported on 06/02/2016 12/21/15   Dedra Skeens, PA-C  metroNIDAZOLE (FLAGYL) 500 MG tablet Take 1 tablet (500 mg total) by mouth 3 (three) times daily. 06/02/16 06/12/16  Nita Sickle, MD  ondansetron (ZOFRAN) 4 MG tablet Take 1 tablet (4 mg total) by mouth every 8 (eight) hours as needed for nausea or vomiting. 06/02/16   Nita Sickle, MD  oxyCODONE (OXY IR/ROXICODONE) 5 MG immediate release tablet Take 1-2 tablets (5-10 mg total) by mouth every 4 (four) hours as needed for breakthrough pain. Patient not taking: Reported on 06/02/2016 12/21/15   Tera Partridge, PA  oxyCODONE-acetaminophen (ROXICET) 5-325 MG tablet Take 1 tablet by mouth every 6 (six) hours as needed. 06/02/16 06/02/17  Nita Sickle, MD  traMADol (ULTRAM) 50 MG tablet Take 1-2 tablets (50-100 mg total) by mouth every 4 (four) hours as needed for moderate pain. Patient not taking: Reported on 06/02/2016 12/22/15   Tera Partridge, PA    Allergies Morphine and related and Penicillins  Family History  Problem Relation Age of Onset  . Breast cancer Maternal Aunt 30    Social History Social History  Substance Use Topics  . Smoking status: Passive Smoke Exposure - Never Smoker  . Smokeless tobacco: Never Used  . Alcohol use No    Review of Systems  Constitutional: Negative for fever. + syncope and chills Eyes: Negative for visual changes. ENT: Negative for sore throat. Neck: No neck pain  Cardiovascular: Negative for chest pain. Respiratory: Negative for shortness of breath. Gastrointestinal: + diffuse cramping abdominal pain, vomiting and diarrhea. Genitourinary: Negative for dysuria. Musculoskeletal: Negative for back pain. Skin: Negative for rash. Neurological: Negative for headaches, weakness or numbness. Psych: No SI or  HI  ____________________________________________   PHYSICAL EXAM:  VITAL SIGNS: ED Triage Vitals  Enc Vitals Group     BP 06/02/16 1542 103/63     Pulse Rate 06/02/16 1542 75     Resp 06/02/16 1542 14     Temp 06/02/16 1542 97.5 F (36.4 C)     Temp Source 06/02/16 1542 Axillary     SpO2 06/02/16 1542 100 %     Weight 06/02/16 1539 173 lb (78.5 kg)     Height 06/02/16 1539 5' (1.524 m)     Head Circumference --      Peak Flow --      Pain Score 06/02/16 1540 5     Pain Loc --      Pain Edu? --      Excl. in GC? --    Constitutional: Alert and oriented. No acute distress. Does not appear intoxicated. HEENT Head: Normocephalic and atraumatic. Face: No facial bony tenderness. Stable midface Ears: No hemotympanum bilaterally.  No Battle sign Eyes: No eye injury. PERRL. No raccoon eyes Nose: Nontender. No epistaxis. No rhinorrhea Mouth/Throat: Mucous membranes are moist. No oropharyngeal blood. No dental injury. Airway patent without stridor. Normal voice. Neck: no C-collar in place. No midline c-spine tenderness.  Cardiovascular: Normal rate, regular rhythm. Normal and symmetric distal pulses are present in all extremities. Pulmonary/Chest: Chest wall is stable and nontender to palpation/compression. Normal respiratory effort. Breath sounds are normal. No crepitus.  Abdominal: Soft, mild diffuse ttp, non distended, normal BS Musculoskeletal: Swelling and ttp over the L lateral malleolus, no ttp of knee, proximal tib/fib. Nontender with normal full range of motion in all other extremities. No deformities. No thoracic or lumbar midline spinal tenderness. Pelvis is stable. Skin: Skin is warm, dry and intact. No abrasions or contutions. Psychiatric: Speech and behavior are appropriate. Neurological: Normal speech and language. Moves all extremities to command. No gross focal neurologic deficits are appreciated.  Glascow Coma Score: 4 - Opens eyes on own 6 - Follows simple motor  commands 5 - Alert and oriented GCS: 15   ____________________________________________   LABS (all labs ordered are listed, but only abnormal results are displayed)  Labs Reviewed  COMPREHENSIVE METABOLIC PANEL - Abnormal; Notable for the following:       Result Value   Sodium 133 (*)    Chloride 99 (*)    Glucose, Bld 291 (*)    BUN 21 (*)    Creatinine, Ser 1.19 (*)    ALT 13 (*)    GFR calc non Af Amer 45 (*)    GFR calc Af Amer 52 (*)    All other components within normal limits  CBC - Abnormal; Notable for the following:    WBC 33.7 (*)    RBC 5.46 (*)    Hemoglobin 16.1 (*)    HCT 49.1 (*)    RDW 14.6 (*)    All other components within normal limits  URINALYSIS, COMPLETE (UACMP) WITH MICROSCOPIC - Abnormal; Notable for the following:    Color, Urine STRAW (*)    APPearance CLEAR (*)    Glucose, UA >=500 (*)    Hgb urine dipstick SMALL (*)    Bacteria, UA RARE (*)    Squamous Epithelial / LPF 0-5 (*)    All other components within normal limits  MAGNESIUM - Abnormal; Notable for the following:    Magnesium 1.4 (*)    All other components within normal limits  TROPONIN I - Abnormal; Notable for the following:    Troponin I 0.03 (*)    All other components within normal limits  C DIFFICILE QUICK SCREEN W PCR REFLEX  LIPASE, BLOOD  BASIC METABOLIC PANEL  TROPONIN I   ____________________________________________  EKG  ED ECG REPORT I, Nita Sicklearolina Haleem Hanner, the attending physician, personally viewed and interpreted this ECG.  Normal sinus rhythm, rate of 73, incomplete right bundle branch block, prolonged QTC of 502, left axis deviation, T-wave inversions in 1, aVL, and V2, no ST elevations. Unchanged from prior ____________________________________________  RADIOLOGY  CT head: negative   XR left ankle: Fractures of the medial and posterior malleolus. Angulated displaced fracture of the distal fibula. Disruption of the tibial talar joint.  CT a/p: 1.  Suspicion of generalized small bowel inflammation, most apparent in the proximal duodenum and loops of jejunum in the left lateral abdomen. Favor infectious enteritis in this clinical setting. No dilated bowel loops. No definite colonic inflammation, although there is a small volume of free fluid adjacent  to the cecum. Superimposed intermittent large bowel diverticulosis 2. Small volume of free fluid in the abdomen and pelvis is nonspecific but favored due to #1. No free air. 3. No other inflammatory process identified. 4. Calcified aortic atherosclerosis. Mild cardiomegaly and lung base atelectasis. Chronic postoperative changes to the lumbar spine appears stable since 2014. ____________________________________________   PROCEDURES  Procedure(s) performed: yes .Splint Application Date/Time: 06/02/2016 8:50 PM Performed by: Nita Sickle Authorized by: Nita Sickle   Consent:    Consent obtained:  Verbal   Consent given by:  Patient   Risks discussed:  Discoloration, numbness, pain and swelling Pre-procedure details:    Sensation:  Normal Procedure details:    Laterality:  Left   Location:  Ankle   Ankle:  L ankle   Cast type: posterior slab with stirrup.   Splint type:  Ankle stirrup and short leg   Supplies:  Ortho-Glass Post-procedure details:    Pain:  Improved   Sensation:  Normal   Patient tolerance of procedure:  Tolerated well, no immediate complications   Critical Care performed:  None ____________________________________________   INITIAL IMPRESSION / ASSESSMENT AND PLAN / ED COURSE  71 y.o. female a history of CAD, diabetes, hypertension, hypothyroidism who presents for evaluation of syncopal episode in the setting of nausea, vomiting, and several episodes of diarrhea since this am. Patient is well-appearing, in no distress, her vital signs are within normal limits, her abdomen is soft with diffuse mild tenderness throughout, no rebound or  guarding. We'll check electrolytes, kidney function, we'll give IV fluids. The patient's QTc is borderline elevated at 502 therefore we'll hold off on anti-emetics especially since patient has had more severe diarrhea than vomiting. We'll give by mouth Tylenol for abdominal cramping. We'll get an CT head to rule out intracranial process in the setting of a syncopal episode. We'll also get x-ray of her ankle to evaluate for fracture.  Clinical Course as of Jun 02 2049  Tue Jun 02, 2016  1739 XR showing trimalleolar fracture. Spoke with Dr. Martha Clan, ortho who recommended splint and follow up in the clinic in 1 week to allow the swelling to improve.   [CV]  2031 Patient complaining that is feeling nauseous again and weak plus patient has ankle fracture, therefore will admit for IV abx, IV hydration.   [CV]    Clinical Course User Index [CV] Nita Sickle, MD    Pertinent labs & imaging results that were available during my care of the patient were reviewed by me and considered in my medical decision making (see chart for details).    ____________________________________________   FINAL CLINICAL IMPRESSION(S) / ED DIAGNOSES  Final diagnoses:  Enteritis  Dehydration  Hypomagnesemia  Closed trimalleolar fracture of left ankle, initial encounter  Syncope, unspecified syncope type  AKI (acute kidney injury) (HCC)      NEW MEDICATIONS STARTED DURING THIS VISIT:  New Prescriptions   CIPROFLOXACIN (CIPRO) 500 MG TABLET    Take 1 tablet (500 mg total) by mouth 2 (two) times daily.   METRONIDAZOLE (FLAGYL) 500 MG TABLET    Take 1 tablet (500 mg total) by mouth 3 (three) times daily.   ONDANSETRON (ZOFRAN) 4 MG TABLET    Take 1 tablet (4 mg total) by mouth every 8 (eight) hours as needed for nausea or vomiting.   OXYCODONE-ACETAMINOPHEN (ROXICET) 5-325 MG TABLET    Take 1 tablet by mouth every 6 (six) hours as needed.     Note:  This document was prepared  using Software engineer and may include unintentional dictation errors.    Nita Sickle, MD 06/02/16 2051

## 2016-06-02 NOTE — ED Triage Notes (Signed)
Pt arrived via ems for c/o n/v/d since noon today - she was sitting on the toilet and fell forwards for unknown reason - denies loss of consciousness - denies dizziness - pt reported having chest discomfort and has hx of CABG - pt reports back pain due to lying in the floor after fall - pt reports that left ankle twisted under her when she fell - c/o left ankle pain and slight swelling noted

## 2016-06-02 NOTE — ED Notes (Signed)
Pt assisted to bedpan and had med size BM

## 2016-06-02 NOTE — ED Notes (Signed)
Pt assisted on bedpan to void 

## 2016-06-02 NOTE — ED Notes (Signed)
Pt arrived via ems for c/o n/v/d since noon today - she was sitting on the toilet and fell forwards for unknown reason - denies loss of consciousness - denies dizziness - pt reported having chest discomfort and has hx of CABG - pt reports back pain due to lying in the floor after fall - pt reports that left ankle twisted under her when she fell - c/o left ankle pain and slight swelling noted to outer aspect of ankle

## 2016-06-02 NOTE — ED Notes (Signed)
Patient transported to CT 

## 2016-06-02 NOTE — Consult Note (Signed)
ORTHOPAEDIC CONSULTATION  REQUESTING PHYSICIAN: Nita Sickle, MD  Chief Complaint: Left Ankle pain status post fall  HPI: Alexandria Marquez is a 71 y.o. female who complains of  left ankle pain after a fall at home today. Patient this morning with nausea and vomiting and diarrhea.  She didn't experience a syncopal episode and afterwards had left ankle pain. Seen in the ER with her husband at the bedside. Patient has been splinted by the ER staff and states that her left ankle pain is manageable at this time.  Past Medical History:  Diagnosis Date  . Anxiety   . Arthritis   . Asthma    when young  . Coronary artery disease   . Depression   . Diabetes mellitus without complication (HCC)   . GERD (gastroesophageal reflux disease)   . History of hiatal hernia   . Hypertension   . Hypothyroidism   . Motion sickness   . Scoliosis   . Shortness of breath dyspnea    doe   Past Surgical History:  Procedure Laterality Date  . ABDOMINAL HYSTERECTOMY    . APPENDECTOMY    . BACK SURGERY     lumbar fusion/plate/rods/screws  . CHOLECYSTECTOMY    . CORONARY ARTERY BYPASS GRAFT     2008  . HERNIA REPAIR    . rcr    . TONSILLECTOMY    . TOTAL KNEE ARTHROPLASTY Right 12/19/2015   Procedure: TOTAL KNEE ARTHROPLASTY;  Surgeon: Christena Flake, MD;  Location: [facility] ORS;  Service: Orthopedics;  Laterality: Right;  . TUBAL LIGATION     Social History   Social History  . Marital status: Married    Spouse name: N/A  . Number of children: N/A  . Years of education: N/A   Social History Main Topics  . Smoking status: Passive Smoke Exposure - Never Smoker  . Smokeless tobacco: Never Used  . Alcohol use No  . Drug use: No  . Sexual activity: Not Asked   Other Topics Concern  . None   Social History Narrative  . None   Family History  Problem Relation Age of Onset  . Breast cancer Maternal Aunt 30   Allergies  Allergen Reactions  . Morphine And Related Nausea And Vomiting  .  Penicillins Hives    Has patient had a PCN reaction causing immediate rash, facial/tongue/throat swelling, SOB or lightheadedness with hypotension: No Has patient had a PCN reaction causing severe rash involving mucus membranes or skin necrosis: No Has patient had a PCN reaction that required hospitalization No Has patient had a PCN reaction occurring within the last 10 years: No If all of the above answers are "NO", then may proceed with Cephalosporin use.    Prior to Admission medications   Medication Sig Start Date End Date Taking? Authorizing Provider  aspirin EC 81 MG tablet Take 81 mg by mouth daily.   Yes Historical Provider, MD  DULoxetine (CYMBALTA) 60 MG capsule Take 60 mg by mouth daily.   Yes Historical Provider, MD  enalapril (VASOTEC) 20 MG tablet Take 20 mg by mouth 2 (two) times daily.   Yes Historical Provider, MD  fluticasone (FLONASE) 50 MCG/ACT nasal spray Place 2 sprays into both nostrils daily.   Yes Historical Provider, MD  gabapentin (NEURONTIN) 600 MG tablet Take 600 mg by mouth at bedtime.   Yes Historical Provider, MD  glimepiride (AMARYL) 4 MG tablet Take 4 mg by mouth 2 (two) times daily.   Yes Historical Provider, MD  levothyroxine (SYNTHROID, LEVOTHROID) 75 MCG tablet Take 75 mcg by mouth daily before breakfast.   Yes Historical Provider, MD  meloxicam (MOBIC) 7.5 MG tablet Take 7.5 mg by mouth 2 (two) times daily.   Yes Historical Provider, MD  metFORMIN (GLUCOPHAGE) 1000 MG tablet Take 1,000 mg by mouth 2 (two) times daily with a meal.   Yes Historical Provider, MD  pantoprazole (PROTONIX) 40 MG tablet Take 40 mg by mouth 2 (two) times daily.   Yes Historical Provider, MD  pioglitazone (ACTOS) 45 MG tablet Take 45 mg by mouth daily.    Yes Historical Provider, MD  simvastatin (ZOCOR) 40 MG tablet Take 40 mg by mouth daily at 6 PM.   Yes Historical Provider, MD  enoxaparin (LOVENOX) 40 MG/0.4ML injection Inject 0.4 mLs (40 mg total) into the skin daily. Patient  not taking: Reported on 06/02/2016 12/21/15   Dedra Skeens, PA-C  oxyCODONE (OXY IR/ROXICODONE) 5 MG immediate release tablet Take 1-2 tablets (5-10 mg total) by mouth every 4 (four) hours as needed for breakthrough pain. Patient not taking: Reported on 06/02/2016 12/21/15   Tera Partridge, PA  traMADol (ULTRAM) 50 MG tablet Take 1-2 tablets (50-100 mg total) by mouth every 4 (four) hours as needed for moderate pain. Patient not taking: Reported on 06/02/2016 12/22/15   Tera Partridge, PA   Dg Ankle Complete Left  Result Date: 06/02/2016 CLINICAL DATA:  Fall. EXAM: LEFT ANKLE COMPLETE - 3+ VIEW COMPARISON:  No recent. FINDINGS: Slight displaced fracture of the medial malleolus noted. Displaced angulated fracture of the distal fibula. Slight displaced fracture of the posterior malleolus. Disruption of the tibial talar joint . IMPRESSION: Fractures of the medial and posterior malleolus. Angulated displaced fracture of the distal fibula. Disruption of the tibial talar joint. Electronically Signed   By: Maisie Fus  Register   On: 06/02/2016 16:43   Ct Head Wo Contrast  Result Date: 06/02/2016 CLINICAL DATA:  Nausea, vomiting, and diarrhea since noon today, fell for from toilet run on reason, denies loss of consciousness, chest discomfort, history CABG, back pain post fall, diabetes mellitus, hypertension EXAM: CT HEAD WITHOUT CONTRAST TECHNIQUE: Contiguous axial images were obtained from the base of the skull through the vertex without intravenous contrast. Sagittal and coronal MPR images reconstructed from axial data set. COMPARISON:  None FINDINGS: Brain: Generalized atrophy of cerebral hemispheres and cerebellum. Normal ventricular morphology. No midline shift or mass effect. No intracranial hemorrhage, mass lesion or evidence acute infarction. No extra-axial fluid collections. Vascular: Mild atherosclerotic calcification at the carotid siphons Skull: Intact though mildly demineralized Sinuses/Orbits: Clear Other: N/A  IMPRESSION: Generalized atrophy. No acute intracranial abnormalities. Electronically Signed   By: Ulyses Southward M.D.   On: 06/02/2016 16:29   Ct Abdomen Pelvis W Contrast  Result Date: 06/02/2016 CLINICAL DATA:  71 year old female with abdominal pain nausea vomiting diarrhea and leukocytosis. Status post fall today. EXAM: CT ABDOMEN AND PELVIS WITH CONTRAST TECHNIQUE: Multidetector CT imaging of the abdomen and pelvis was performed using the standard protocol following bolus administration of intravenous contrast. CONTRAST:  75mL ISOVUE-300 IOPAMIDOL (ISOVUE-300) INJECTION 61% COMPARISON:  Lumbar MRI 10/01/2014. CT Abdomen and Pelvis 08/01/2012 and earlier. FINDINGS: Lower chest: Lower lung volumes today compared to 2014. Calcified granulomas in the left lower lobe. Mild peribronchial and dependent atelectasis. No pleural effusion. No suspicious lung base opacity. Cardiomegaly. No pericardial effusion. Hepatobiliary: Chronic surgically absent gallbladder. Negative liver aside from a small volume of free fluid adjacent to the dome (series 3, image 8).  Intra and extrahepatic biliary ducts appear stable since 2014. Pancreas: Fatty atrophied, stable since 2014. Spleen: Negative.  No adjacent free fluid. Adrenals/Urinary Tract: Normal adrenal glands. Bilateral renal enhancement and contrast excretion is within normal limits. Kidneys appear stable since 2014. No hydroureter. Decompressed and unremarkable urinary bladder. Multiple pelvic phleboliths. Stomach/Bowel: Decompressed and negative rectum. Redundant sigmoid colon with moderate diverticulosis of the proximal sigmoid continuing into the distal descending colon. No definite active inflammation. There is a trace amount of free fluid adjacent to the distal sigmoid. The left colon is decompressed with mild diverticulosis also at the splenic flexure. No active inflammation. The transverse colon and right colon are decompressed. There is a small volume of free fluid  about the cecum. Appendix appears to be diminutive or absent. Negative terminal ileum. No dilated small bowel. Questionable widespread small bowel mucosal hyper enhancement, and there does appear to be mild mesenteric stranding associated with left lateral abdominal small bowel loops (coronal images 30 through 37) where there is also a small volume of free fluid. Mild fluid distension of the stomach and distal esophagus with small sliding-type hiatal hernia suspected. Suggestion of mild wall where there is thickening in the duodenum mild adjacent inflammatory stranding (series 3, image 33). No pneumoperitoneum. There is rectus muscle diastases in the midline of the lower abdomen but no bona fide or incarcerated hernia. No other mesenteric stranding identified. Vascular/Lymphatic: Aortoiliac calcified atherosclerosis noted. Major arterial structures are patent. Portal venous system is patent. New line no lymphadenopathy. Reproductive: Surgically absent as in 2014. Other: Small volume of simple density free fluid in the pelvis (series 3, image 64). Musculoskeletal: Osteopenia. Previous lower lumbar spine fusion changes. The hardware appears stable since 2014, as does a chronic postoperative fluid collection at the laminectomy space (probably a seroma). Partially visible previous median sternotomy. No lower rib fracture identified. Pelvis appears stable and intact. Chronic pubic symphysis degeneration. No acute osseous abnormality identified. IMPRESSION: 1. Suspicion of generalized small bowel inflammation, most apparent in the proximal duodenum and loops of jejunum in the left lateral abdomen. Favor infectious enteritis in this clinical setting. No dilated bowel loops. No definite colonic inflammation, although there is a small volume of free fluid adjacent to the cecum. Superimposed intermittent large bowel diverticulosis 2. Small volume of free fluid in the abdomen and pelvis is nonspecific but favored due to #1. No  free air. 3. No other inflammatory process identified. 4. Calcified aortic atherosclerosis. Mild cardiomegaly and lung base atelectasis. Chronic postoperative changes to the lumbar spine appears stable since 2014. Electronically Signed   By: Odessa Fleming M.D.   On: 06/02/2016 17:36    Positive ROS: All other systems have been reviewed and were otherwise negative with the exception of those mentioned in the HPI and as above.  Physical Exam: General: Alert, no acute distress  MUSCULOSKELETAL: Left ankle: Patient is in an AO splint. It is clean and dry. Patient flex and extend her toes. Her toes are well-perfused and she has intact sensation to light touch in all toes. There is no obvious deformity to her left ankle. The skin has been reported to be intact.  Assessment: Trimalleolar left ankle fracture  Plan: I reviewed with the patient and her husband the nature of her injury. She is a bimalleolar ankle fracture with some angulation of the lateral malleolus. There is no significant talar subluxation however. He shouldn't was reported to have intact skin but significant swelling. This is evident on her plain x-rays as well. In  addition she is suffering from nausea or vomiting and diarrhea. Patient will likely need surgery to fix a bimalleolar ankle fracture but she needs to recover from her current gastrointestinal condition and her ankle swelling needs to subside. Patient will be nonweightbearing on the left lower extremity until surgery. She will follow up with me in the office next week for evaluation of her swelling. Her June will be scheduled electively once the swelling has resolved.    Juanell FairlyKRASINSKI, Teo Moede, MD    06/02/2016 8:15 PM

## 2016-06-02 NOTE — ED Notes (Signed)
Pt assisted to bedpan - had large semi-formed BM

## 2016-06-03 LAB — COMPREHENSIVE METABOLIC PANEL
ALT: 13 U/L — ABNORMAL LOW (ref 14–54)
AST: 19 U/L (ref 15–41)
Albumin: 3.2 g/dL — ABNORMAL LOW (ref 3.5–5.0)
Alkaline Phosphatase: 48 U/L (ref 38–126)
Anion gap: 7 (ref 5–15)
BUN: 18 mg/dL (ref 6–20)
CO2: 23 mmol/L (ref 22–32)
Calcium: 8.8 mg/dL — ABNORMAL LOW (ref 8.9–10.3)
Chloride: 106 mmol/L (ref 101–111)
Creatinine, Ser: 0.87 mg/dL (ref 0.44–1.00)
GFR calc Af Amer: >60 mL/min (ref >60)
GFR calc non Af Amer: >60 mL/min (ref >60)
Glucose, Bld: 103 mg/dL — ABNORMAL HIGH (ref 65–99)
Potassium: 4.3 mmol/L (ref 3.5–5.1)
Sodium: 136 mmol/L (ref 135–145)
Total Bilirubin: 0.8 mg/dL (ref 0.3–1.2)
Total Protein: 6 g/dL — ABNORMAL LOW (ref 6.5–8.1)

## 2016-06-03 LAB — CBC
HCT: 36.9 % (ref 35.0–47.0)
Hemoglobin: 12.6 g/dL (ref 12.0–16.0)
MCH: 30.6 pg (ref 26.0–34.0)
MCHC: 34.1 g/dL (ref 32.0–36.0)
MCV: 89.7 fL (ref 80.0–100.0)
Platelets: 299 10*3/uL (ref 150–440)
RBC: 4.11 MIL/uL (ref 3.80–5.20)
RDW: 14.3 % (ref 11.5–14.5)
WBC: 18.2 10*3/uL — ABNORMAL HIGH (ref 3.6–11.0)

## 2016-06-03 LAB — MAGNESIUM: Magnesium: 1.9 mg/dL (ref 1.7–2.4)

## 2016-06-03 LAB — GLUCOSE, CAPILLARY
Glucose-Capillary: 95 mg/dL (ref 65–99)
Glucose-Capillary: 197 mg/dL — ABNORMAL HIGH (ref 65–99)
Glucose-Capillary: 104 mg/dL — ABNORMAL HIGH (ref 65–99)
Glucose-Capillary: 140 mg/dL — ABNORMAL HIGH (ref 65–99)

## 2016-06-03 MED ORDER — CIPROFLOXACIN HCL 500 MG PO TABS
500.0000 mg | ORAL_TABLET | Freq: Two times a day (BID) | ORAL | Status: DC
  Administered 2016-06-03 – 2016-06-04 (×2): 500 mg via ORAL
  Filled 2016-06-03 (×2): qty 1

## 2016-06-03 NOTE — Progress Notes (Signed)
Subjective:  Patient feeling better today. She is seen with one of the therapists at her bedside. Patient reports pain as mild to moderate.    Objective:   VITALS:   Vitals:   06/03/16 0430 06/03/16 0500 06/03/16 0741 06/03/16 1205  BP: (!) 156/49  132/61 (!) 150/70  Pulse: 77  77 79  Resp: 18  18 18   Temp: 98.2 F (36.8 C)   97.8 F (36.6 C)  TempSrc: Oral   Oral  SpO2: 100%  98% 98%  Weight:  83.1 kg (183 lb 4.8 oz)    Height:        PHYSICAL EXAM:  Left lower extremity: Neurovascular intact Sensation intact distally Splint and dressings are clean dry and intact.  LABS  Results for orders placed or performed during the hospital encounter of 06/02/16 (from the past 24 hour(s))  Lipase, blood     Status: None   Collection Time: 06/02/16  3:47 PM  Result Value Ref Range   Lipase 15 11 - 51 U/L  Comprehensive metabolic panel     Status: Abnormal   Collection Time: 06/02/16  3:47 PM  Result Value Ref Range   Sodium 133 (L) 135 - 145 mmol/L   Potassium 4.5 3.5 - 5.1 mmol/L   Chloride 99 (L) 101 - 111 mmol/L   CO2 22 22 - 32 mmol/L   Glucose, Bld 291 (H) 65 - 99 mg/dL   BUN 21 (H) 6 - 20 mg/dL   Creatinine, Ser 1.61 (H) 0.44 - 1.00 mg/dL   Calcium 8.9 8.9 - 09.6 mg/dL   Total Protein 6.6 6.5 - 8.1 g/dL   Albumin 3.6 3.5 - 5.0 g/dL   AST 36 15 - 41 U/L   ALT 13 (L) 14 - 54 U/L   Alkaline Phosphatase 54 38 - 126 U/L   Total Bilirubin 1.2 0.3 - 1.2 mg/dL   GFR calc non Af Amer 45 (L) >60 mL/min   GFR calc Af Amer 52 (L) >60 mL/min   Anion gap 12 5 - 15  CBC     Status: Abnormal   Collection Time: 06/02/16  3:47 PM  Result Value Ref Range   WBC 33.7 (H) 3.6 - 11.0 K/uL   RBC 5.46 (H) 3.80 - 5.20 MIL/uL   Hemoglobin 16.1 (H) 12.0 - 16.0 g/dL   HCT 04.5 (H) 40.9 - 81.1 %   MCV 89.8 80.0 - 100.0 fL   MCH 29.4 26.0 - 34.0 pg   MCHC 32.8 32.0 - 36.0 g/dL   RDW 91.4 (H) 78.2 - 95.6 %   Platelets 440 150 - 440 K/uL  Magnesium     Status: Abnormal    Collection Time: 06/02/16  3:47 PM  Result Value Ref Range   Magnesium 1.4 (L) 1.7 - 2.4 mg/dL  Troponin I     Status: Abnormal   Collection Time: 06/02/16  3:47 PM  Result Value Ref Range   Troponin I 0.03 (HH) <0.03 ng/mL  Urinalysis, Complete w Microscopic     Status: Abnormal   Collection Time: 06/02/16  7:51 PM  Result Value Ref Range   Color, Urine STRAW (A) YELLOW   APPearance CLEAR (A) CLEAR   Specific Gravity, Urine 1.025 1.005 - 1.030   pH 5.0 5.0 - 8.0   Glucose, UA >=500 (A) NEGATIVE mg/dL   Hgb urine dipstick SMALL (A) NEGATIVE   Bilirubin Urine NEGATIVE NEGATIVE   Ketones, ur NEGATIVE NEGATIVE mg/dL   Protein, ur  NEGATIVE NEGATIVE mg/dL   Nitrite NEGATIVE NEGATIVE   Leukocytes, UA NEGATIVE NEGATIVE   RBC / HPF 0-5 0 - 5 RBC/hpf   WBC, UA 0-5 0 - 5 WBC/hpf   Bacteria, UA RARE (A) NONE SEEN   Squamous Epithelial / LPF 0-5 (A) NONE SEEN   Mucous PRESENT    Hyaline Casts, UA PRESENT   Basic metabolic panel     Status: Abnormal   Collection Time: 06/02/16  8:35 PM  Result Value Ref Range   Sodium 133 (L) 135 - 145 mmol/L   Potassium 4.5 3.5 - 5.1 mmol/L   Chloride 100 (L) 101 - 111 mmol/L   CO2 24 22 - 32 mmol/L   Glucose, Bld 224 (H) 65 - 99 mg/dL   BUN 21 (H) 6 - 20 mg/dL   Creatinine, Ser 1.61 0.44 - 1.00 mg/dL   Calcium 8.8 (L) 8.9 - 10.3 mg/dL   GFR calc non Af Amer >60 >60 mL/min   GFR calc Af Amer >60 >60 mL/min   Anion gap 9 5 - 15  Troponin I     Status: Abnormal   Collection Time: 06/02/16  8:35 PM  Result Value Ref Range   Troponin I 0.03 (HH) <0.03 ng/mL  Glucose, capillary     Status: Abnormal   Collection Time: 06/02/16 11:14 PM  Result Value Ref Range   Glucose-Capillary 168 (H) 65 - 99 mg/dL  CBC     Status: Abnormal   Collection Time: 06/03/16  4:05 AM  Result Value Ref Range   WBC 18.2 (H) 3.6 - 11.0 K/uL   RBC 4.11 3.80 - 5.20 MIL/uL   Hemoglobin 12.6 12.0 - 16.0 g/dL   HCT 09.6 04.5 - 40.9 %   MCV 89.7 80.0 - 100.0 fL   MCH 30.6  26.0 - 34.0 pg   MCHC 34.1 32.0 - 36.0 g/dL   RDW 81.1 91.4 - 78.2 %   Platelets 299 150 - 440 K/uL  Comprehensive metabolic panel     Status: Abnormal   Collection Time: 06/03/16  4:05 AM  Result Value Ref Range   Sodium 136 135 - 145 mmol/L   Potassium 4.3 3.5 - 5.1 mmol/L   Chloride 106 101 - 111 mmol/L   CO2 23 22 - 32 mmol/L   Glucose, Bld 103 (H) 65 - 99 mg/dL   BUN 18 6 - 20 mg/dL   Creatinine, Ser 9.56 0.44 - 1.00 mg/dL   Calcium 8.8 (L) 8.9 - 10.3 mg/dL   Total Protein 6.0 (L) 6.5 - 8.1 g/dL   Albumin 3.2 (L) 3.5 - 5.0 g/dL   AST 19 15 - 41 U/L   ALT 13 (L) 14 - 54 U/L   Alkaline Phosphatase 48 38 - 126 U/L   Total Bilirubin 0.8 0.3 - 1.2 mg/dL   GFR calc non Af Amer >60 >60 mL/min   GFR calc Af Amer >60 >60 mL/min   Anion gap 7 5 - 15  Magnesium     Status: None   Collection Time: 06/03/16  4:05 AM  Result Value Ref Range   Magnesium 1.9 1.7 - 2.4 mg/dL  Glucose, capillary     Status: None   Collection Time: 06/03/16  7:37 AM  Result Value Ref Range   Glucose-Capillary 95 65 - 99 mg/dL  Glucose, capillary     Status: Abnormal   Collection Time: 06/03/16 12:05 PM  Result Value Ref Range   Glucose-Capillary 197 (H) 65 - 99 mg/dL  Dg Ankle Complete Left  Result Date: 06/02/2016 CLINICAL DATA:  Fall. EXAM: LEFT ANKLE COMPLETE - 3+ VIEW COMPARISON:  No recent. FINDINGS: Slight displaced fracture of the medial malleolus noted. Displaced angulated fracture of the distal fibula. Slight displaced fracture of the posterior malleolus. Disruption of the tibial talar joint . IMPRESSION: Fractures of the medial and posterior malleolus. Angulated displaced fracture of the distal fibula. Disruption of the tibial talar joint. Electronically Signed   By: Maisie Fus  Register   On: 06/02/2016 16:43   Ct Head Wo Contrast  Result Date: 06/02/2016 CLINICAL DATA:  Nausea, vomiting, and diarrhea since noon today, fell for from toilet run on reason, denies loss of consciousness, chest  discomfort, history CABG, back pain post fall, diabetes mellitus, hypertension EXAM: CT HEAD WITHOUT CONTRAST TECHNIQUE: Contiguous axial images were obtained from the base of the skull through the vertex without intravenous contrast. Sagittal and coronal MPR images reconstructed from axial data set. COMPARISON:  None FINDINGS: Brain: Generalized atrophy of cerebral hemispheres and cerebellum. Normal ventricular morphology. No midline shift or mass effect. No intracranial hemorrhage, mass lesion or evidence acute infarction. No extra-axial fluid collections. Vascular: Mild atherosclerotic calcification at the carotid siphons Skull: Intact though mildly demineralized Sinuses/Orbits: Clear Other: N/A IMPRESSION: Generalized atrophy. No acute intracranial abnormalities. Electronically Signed   By: Ulyses Southward M.D.   On: 06/02/2016 16:29   Ct Abdomen Pelvis W Contrast  Result Date: 06/02/2016 CLINICAL DATA:  71 year old female with abdominal pain nausea vomiting diarrhea and leukocytosis. Status post fall today. EXAM: CT ABDOMEN AND PELVIS WITH CONTRAST TECHNIQUE: Multidetector CT imaging of the abdomen and pelvis was performed using the standard protocol following bolus administration of intravenous contrast. CONTRAST:  75mL ISOVUE-300 IOPAMIDOL (ISOVUE-300) INJECTION 61% COMPARISON:  Lumbar MRI 10/01/2014. CT Abdomen and Pelvis 08/01/2012 and earlier. FINDINGS: Lower chest: Lower lung volumes today compared to 2014. Calcified granulomas in the left lower lobe. Mild peribronchial and dependent atelectasis. No pleural effusion. No suspicious lung base opacity. Cardiomegaly. No pericardial effusion. Hepatobiliary: Chronic surgically absent gallbladder. Negative liver aside from a small volume of free fluid adjacent to the dome (series 3, image 8). Intra and extrahepatic biliary ducts appear stable since 2014. Pancreas: Fatty atrophied, stable since 2014. Spleen: Negative.  No adjacent free fluid. Adrenals/Urinary  Tract: Normal adrenal glands. Bilateral renal enhancement and contrast excretion is within normal limits. Kidneys appear stable since 2014. No hydroureter. Decompressed and unremarkable urinary bladder. Multiple pelvic phleboliths. Stomach/Bowel: Decompressed and negative rectum. Redundant sigmoid colon with moderate diverticulosis of the proximal sigmoid continuing into the distal descending colon. No definite active inflammation. There is a trace amount of free fluid adjacent to the distal sigmoid. The left colon is decompressed with mild diverticulosis also at the splenic flexure. No active inflammation. The transverse colon and right colon are decompressed. There is a small volume of free fluid about the cecum. Appendix appears to be diminutive or absent. Negative terminal ileum. No dilated small bowel. Questionable widespread small bowel mucosal hyper enhancement, and there does appear to be mild mesenteric stranding associated with left lateral abdominal small bowel loops (coronal images 30 through 37) where there is also a small volume of free fluid. Mild fluid distension of the stomach and distal esophagus with small sliding-type hiatal hernia suspected. Suggestion of mild wall where there is thickening in the duodenum mild adjacent inflammatory stranding (series 3, image 33). No pneumoperitoneum. There is rectus muscle diastases in the midline of the lower abdomen but no bona fide or  incarcerated hernia. No other mesenteric stranding identified. Vascular/Lymphatic: Aortoiliac calcified atherosclerosis noted. Major arterial structures are patent. Portal venous system is patent. New line no lymphadenopathy. Reproductive: Surgically absent as in 2014. Other: Small volume of simple density free fluid in the pelvis (series 3, image 64). Musculoskeletal: Osteopenia. Previous lower lumbar spine fusion changes. The hardware appears stable since 2014, as does a chronic postoperative fluid collection at the  laminectomy space (probably a seroma). Partially visible previous median sternotomy. No lower rib fracture identified. Pelvis appears stable and intact. Chronic pubic symphysis degeneration. No acute osseous abnormality identified. IMPRESSION: 1. Suspicion of generalized small bowel inflammation, most apparent in the proximal duodenum and loops of jejunum in the left lateral abdomen. Favor infectious enteritis in this clinical setting. No dilated bowel loops. No definite colonic inflammation, although there is a small volume of free fluid adjacent to the cecum. Superimposed intermittent large bowel diverticulosis 2. Small volume of free fluid in the abdomen and pelvis is nonspecific but favored due to #1. No free air. 3. No other inflammatory process identified. 4. Calcified aortic atherosclerosis. Mild cardiomegaly and lung base atelectasis. Chronic postoperative changes to the lumbar spine appears stable since 2014. Electronically Signed   By: Odessa FlemingH  Hall M.D.   On: 06/02/2016 17:36    Assessment/Plan:     Active Problems:   Enteritis  Ms. Yetta BarreJones is doing much better today.  She is likely to be discharged home tomorrow. She will follow up with me next week for reevaluation and surgical planning. Patient to have ORIF of a left bimalleolar ankle fracture once the swelling has subsided.  Patient is to leave splint on and in place until her follow-up. She is nonweightbearing on the left lower extremity.    Juanell FairlyKRASINSKI, Vernor Monnig , MD 06/03/2016, 1:59 PM

## 2016-06-03 NOTE — Progress Notes (Signed)
Chaplain responded to an OR for an Ad. Pt in Rm 136A expressed interest in completing an Ad, CH met the Pt with Pt's husband at bedside. Chaplain educated the Pt on Ad, but the Pt asked for more time to go over the document before completing it. Chaplain informed Pt that she could requested for the CH to help her complete Ad when she is ready to complete it. Pt requested prayers for healing, which the Pt provided.      06/03/16 1600  Clinical Encounter Type  Visited With Patient;Family  Visit Type Initial;ED  Referral From Nurse  Consult/Referral To Chaplain  Spiritual Encounters  Spiritual Needs Literature;Brochure;Other (Comment)     

## 2016-06-03 NOTE — Evaluation (Signed)
Physical Therapy Evaluation Patient Details Name: Alexandria Marquez MRN: [id number] DOB: 07-Jul-1945 Today's Date: 06/03/2016   History of Present Illness  Pt is a 71 y.o. female with a known history of DM, HTN, CAD here with Nausea, vomiting and diarrhea.  She passed out on th toilet and twisted her ankle. She has a left ankle fracture with planned surgical repair in 1-2 weeks.  Per patient, pt has an extensive orthopedic history including R TKA 11/2015, R hip OA, osteoporosis, back fusion 2014, L RTC repair, R broken shoulder 2015, and scoliosis.   Clinical Impression  Pt presents with deficits in strength, transfers, mobility, gait, balance, and activity tolerance.  Pt SBA with bed mobility tasks with extra time and effort required.  Pt CGA from elevated EOB with sit to/from stand transfers, min A from standard height surface.  Once standing with RW pt steady and able to maintain LLE WB status during pivot transfers to the L and to the R.   Per patient, pt has an extensive orthopedic history including R TKA 11/2015, R hip OA, osteoporosis, back fusion 2014, L RTC repair, R broken shoulder 2015, and scoliosis.  Pt anxious regarding attempt at hop-to gait and unable to try secondary to extensive ortho history.  Pt would benefit from SNF setting to address above deficits for decreased caregiver assistance and increased safety with eventual home discharge.       Follow Up Recommendations SNF    Equipment Recommendations   (Ramp for home access)    Recommendations for Other Services       Precautions / Restrictions Precautions Precautions: Fall Restrictions Weight Bearing Restrictions: Yes LLE Weight Bearing: Non weight bearing      Mobility  Bed Mobility Overal bed mobility: Needs Assistance Bed Mobility: Supine to Sit;Sit to Supine     Supine to sit: Supervision Sit to supine: Supervision   General bed mobility comments: Increased time and effort with bed mobility  tasks  Transfers Overall transfer level: Needs assistance Equipment used: Rolling walker (2 wheeled) Transfers: Sit to/from [employer] Sit to Stand: Min guard Stand pivot transfers: Min guard       General transfer comment: Min verbal cues for sequencing during transfers and to maintain WB status; extra time and effort required with sit to stand but no physical assistance needed.  Ambulation/Gait             General Gait Details: Pt unable to perform hop-to step.  Stairs Stairs:  (Unable/unsafe to attempt)          Wheelchair Mobility    Modified Rankin (Stroke Patients Only)       Balance Overall balance assessment: Needs assistance Sitting-balance support: No upper extremity supported Sitting balance-Leahy Scale: Normal     Standing balance support: Bilateral upper extremity supported Standing balance-Leahy Scale: Fair                               Pertinent Vitals/Pain Pain Assessment: No/denies pain    Home Living Family/patient expects to be discharged to:: Private residence Living Arrangements: Spouse/significant other Available Help at Discharge: Family Type of Home: House Home Access: Stairs to enter Entrance Stairs-Rails: Can reach both Entrance Stairs-Number of Steps: 2 Home Layout: One level Home Equipment: Cane - single point;Walker - 4 wheels;Walker - 2 wheels Additional Comments: Pt plans to borrow a manual w/c prior to discharge    Prior Function Level of Independence: Independent  Comments: Ind amb without AD in the home and with rollator in the community     Hand Dominance        Extremity/Trunk Assessment        Lower Extremity Assessment Lower Extremity Assessment: Generalized weakness       Communication   Communication: HOH  Cognition Arousal/Alertness: Awake/alert Behavior During Therapy: WFL for tasks assessed/performed Overall Cognitive Status: Within Functional Limits  for tasks assessed                      General Comments      Exercises Total Joint Exercises Ankle Circles/Pumps: AROM;Right;10 reps;15 reps Quad Sets: Strengthening;Both;10 reps Gluteal Sets: Strengthening;Both;10 reps Long Arc Quad: AROM;Both;10 reps;15 reps Knee Flexion: AROM;Both;10 reps;15 reps Other Exercises Other Exercises: HEP education/review for RLE APs and BLE QS and GS x 10 each 5-6x/day   Assessment/Plan    PT Assessment Patient needs continued PT services  PT Problem List Decreased strength;Decreased activity tolerance;Decreased balance;Decreased mobility;Decreased knowledge of use of DME       PT Treatment Interventions Gait training;DME instruction;Stair training;Functional mobility training;Neuromuscular re-education;Balance training;Therapeutic exercise;Therapeutic activities;Patient/family education    PT Goals (Current goals can be found in the Care Plan section)  Acute Rehab PT Goals Patient Stated Goal: Improve leg strength PT Goal Formulation: With patient Time For Goal Achievement: 06/16/16 Potential to Achieve Goals: Good    Frequency 7X/week   Barriers to discharge        Co-evaluation               End of Session Equipment Utilized During Treatment: Gait belt Activity Tolerance: Patient tolerated treatment well Patient left: in chair;with chair alarm set;with call bell/phone within reach Nurse Communication: Mobility status PT Visit Diagnosis: Difficulty in walking, not elsewhere classified (R26.2);Muscle weakness (generalized) (M62.81)         Time: [id number] PT Time Calculation (min) (ACUTE ONLY): 51 min   Charges:   PT Evaluation $PT Eval Low Complexity: 1 Procedure PT Treatments $Therapeutic Exercise: 8-22 mins   PT G Codes:         Elly Modena. Scott Barbera Perritt PT, DPT 06/03/16, 3:10 PM

## 2016-06-03 NOTE — Clinical Social Work Note (Signed)
Clinical Social Work Assessment  Patient Details  Name: Alexandria Marquez MRN: [id number] Date of Birth: [birth year]  Date of referral:  06/03/16               Reason for consult:  [employer], Discharge Planning                Permission sought to share information with:  Case Manager Permission granted to share information::     Name::        Agency::     Relationship::     Contact Information:     Housing/Transportation Living arrangements for the past 2 months:  Single Family Home Source of Information:  Patient, Spouse Patient Interpreter Needed:  None Criminal Activity/Legal Involvement Pertinent to Current Situation/Hospitalization:  No - Comment as needed Significant Relationships:  Spouse Lives with:  Spouse Do you feel safe going back to the place where you live?  Yes Need for family participation in patient care:  Yes (Comment)  Care giving concerns:  Patient lives in [place] with her husband Alexandria Marquez.     Social Worker assessment / plan:  Holiday representative (CSW) reviewed chart and noted that patient has an ankle fracture. Per RN in progression rounds patient will go home and follow up with Ortho in a week to receive outpatient surgery for her ankle. CSW met with patient and her husband Alexandria Marquez was at bedside. Patient and husband remembered CSW from previous admission when patient had her knee replaced. Per patient she went to [facility] last time. Patient reported that this time she will go home and her husband will take care of her. Per husband he is retired and can provide 24/7 care. CSW explained that medicare requires a 3 night qualifying inpatient stay at a hospital in order to pay for SNF. CSW explained that patient was admitted to inpatient on 06/02/16. Patient verbalized her understanding and reported that she plans on going home. RN case manager aware of above. CSW will continue to follow and assist as needed.   Employment status:  Retired Radiation protection practitioner:  Medicare PT Recommendations:  Not assessed at this time [place] / Referral to community resources:  Other (Comment Required) (SNF VS. Home Health )  Patient/Family's Response to care:  Patient reported that she plans on going home.   Patient/Family's Understanding of and Emotional Response to Diagnosis, Current Treatment, and Prognosis:  Patient and husband were very pleasant and thanked CSW for visit.   Emotional Assessment Appearance:  Appears stated age Attitude/Demeanor/Rapport:    Affect (typically observed):  Accepting, Adaptable, Pleasant Orientation:  Oriented to Self, Oriented to Place, Oriented to  Time, Oriented to Situation Alcohol / Substance use:  Not Applicable Psych involvement (Current and /or in the community):  No (Comment)  Discharge Needs  Concerns to be addressed:  Discharge Planning Concerns Readmission within the last 30 days:  No Current discharge risk:  Dependent with Mobility Barriers to Discharge:  Continued Medical Work up   [employer], Veronia Beets, LCSW 06/03/2016, 3:01 PM

## 2016-06-03 NOTE — Progress Notes (Signed)
[facility] [place] [facility]   PATIENT NAME: Alexandria Marquez    MR#:  [id number]  DATE OF BIRTH:  1945/03/28  SUBJECTIVE:  Patient denies any bowel movement since admission. She has some left ankle pain on movement. Tolerating regular breakfast and lunch. No nausea or vomiting.  REVIEW OF SYSTEMS:   Review of Systems  Constitutional: Negative for chills, fever and weight loss.  HENT: Negative for ear discharge, ear pain and nosebleeds.   Eyes: Negative for blurred vision, pain and discharge.  Respiratory: Negative for sputum production, shortness of breath, wheezing and stridor.   Cardiovascular: Negative for chest pain, palpitations, orthopnea and PND.  Gastrointestinal: Negative for abdominal pain, diarrhea, nausea and vomiting.  Genitourinary: Negative for frequency and urgency.  Musculoskeletal: Positive for joint pain. Negative for back pain.  Neurological: Positive for weakness. Negative for sensory change, speech change and focal weakness.  Psychiatric/Behavioral: Negative for depression and hallucinations. The patient is not nervous/anxious.    Tolerating Diet:yes Tolerating PT: pending  DRUG ALLERGIES:   Allergies  Allergen Reactions  . Morphine And Related Nausea And Vomiting  . Penicillins Hives    Has patient had a PCN reaction causing immediate rash, facial/tongue/throat swelling, SOB or lightheadedness with hypotension: No Has patient had a PCN reaction causing severe rash involving mucus membranes or skin necrosis: No Has patient had a PCN reaction that required hospitalization No Has patient had a PCN reaction occurring within the last 10 years: No If all of the above answers are "NO", then may proceed with Cephalosporin use.     VITALS:  Blood pressure (!) 150/70, pulse 79, temperature 97.8 F (36.6 C), temperature source Oral, resp. rate 18, height 5' (1.524 m), weight 83.1 kg (183 lb 4.8 oz), SpO2 98 %.  PHYSICAL EXAMINATION:    Physical Exam  GENERAL:  71 y.o.-year-old patient lying in the bed with no acute distress.  EYES: Pupils equal, round, reactive to light and accommodation. No scleral icterus. Extraocular muscles intact.  HEENT: Head atraumatic, normocephalic. Oropharynx and nasopharynx clear.  NECK:  Supple, no jugular venous distention. No thyroid enlargement, no tenderness.  LUNGS: Normal breath sounds bilaterally, no wheezing, rales, rhonchi. No use of accessory muscles of respiration.  CARDIOVASCULAR: S1, S2 normal. No murmurs, rubs, or gallops.  ABDOMEN: Soft, nontender, nondistended. Bowel sounds present. No organomegaly or mass.  EXTREMITIES: No cyanosis, clubbing or edema b/l.   Left ankle splint+ NEUROLOGIC: Cranial nerves II through XII are intact. No focal Motor or sensory deficits b/l.   PSYCHIATRIC:  patient is alert and oriented x 3.  SKIN: No obvious rash, lesion, or ulcer.   LABORATORY PANEL:  CBC  Recent Labs Lab 06/03/16 0405  WBC 18.2*  HGB 12.6  HCT 36.9  PLT 299    Chemistries   Recent Labs Lab 06/03/16 0405  NA 136  K 4.3  CL 106  CO2 23  GLUCOSE 103*  BUN 18  CREATININE 0.87  CALCIUM 8.8*  MG 1.9  AST 19  ALT 13*  ALKPHOS 48  BILITOT 0.8   Cardiac Enzymes  Recent Labs Lab 06/02/16 2035  TROPONINI 0.03*   RADIOLOGY:  Dg Ankle Complete Left  Result Date: 06/02/2016 CLINICAL DATA:  Fall. EXAM: LEFT ANKLE COMPLETE - 3+ VIEW COMPARISON:  No recent. FINDINGS: Slight displaced fracture of the medial malleolus noted. Displaced angulated fracture of the distal fibula. Slight displaced fracture of the posterior malleolus. Disruption of the tibial talar joint . IMPRESSION: Fractures of  the medial and posterior malleolus. Angulated displaced fracture of the distal fibula. Disruption of the tibial talar joint. Electronically Signed   By: Maisie Fushomas  Register   On: 06/02/2016 16:43   Ct Head Wo Contrast  Result Date: 06/02/2016 CLINICAL DATA:  Nausea, vomiting, and  diarrhea since noon today, fell for from toilet run on reason, denies loss of consciousness, chest discomfort, history CABG, back pain post fall, diabetes mellitus, hypertension EXAM: CT HEAD WITHOUT CONTRAST TECHNIQUE: Contiguous axial images were obtained from the base of the skull through the vertex without intravenous contrast. Sagittal and coronal MPR images reconstructed from axial data set. COMPARISON:  None FINDINGS: Brain: Generalized atrophy of cerebral hemispheres and cerebellum. Normal ventricular morphology. No midline shift or mass effect. No intracranial hemorrhage, mass lesion or evidence acute infarction. No extra-axial fluid collections. Vascular: Mild atherosclerotic calcification at the carotid siphons Skull: Intact though mildly demineralized Sinuses/Orbits: Clear Other: N/A IMPRESSION: Generalized atrophy. No acute intracranial abnormalities. Electronically Signed   By: Ulyses SouthwardMark  Boles M.D.   On: 06/02/2016 16:29   Ct Abdomen Pelvis W Contrast  Result Date: 06/02/2016 CLINICAL DATA:  71 year old female with abdominal pain nausea vomiting diarrhea and leukocytosis. Status post fall today. EXAM: CT ABDOMEN AND PELVIS WITH CONTRAST TECHNIQUE: Multidetector CT imaging of the abdomen and pelvis was performed using the standard protocol following bolus administration of intravenous contrast. CONTRAST:  75mL ISOVUE-300 IOPAMIDOL (ISOVUE-300) INJECTION 61% COMPARISON:  Lumbar MRI 10/01/2014. CT Abdomen and Pelvis 08/01/2012 and earlier. FINDINGS: Lower chest: Lower lung volumes today compared to 2014. Calcified granulomas in the left lower lobe. Mild peribronchial and dependent atelectasis. No pleural effusion. No suspicious lung base opacity. Cardiomegaly. No pericardial effusion. Hepatobiliary: Chronic surgically absent gallbladder. Negative liver aside from a small volume of free fluid adjacent to the dome (series 3, image 8). Intra and extrahepatic biliary ducts appear stable since 2014. Pancreas:  Fatty atrophied, stable since 2014. Spleen: Negative.  No adjacent free fluid. Adrenals/Urinary Tract: Normal adrenal glands. Bilateral renal enhancement and contrast excretion is within normal limits. Kidneys appear stable since 2014. No hydroureter. Decompressed and unremarkable urinary bladder. Multiple pelvic phleboliths. Stomach/Bowel: Decompressed and negative rectum. Redundant sigmoid colon with moderate diverticulosis of the proximal sigmoid continuing into the distal descending colon. No definite active inflammation. There is a trace amount of free fluid adjacent to the distal sigmoid. The left colon is decompressed with mild diverticulosis also at the splenic flexure. No active inflammation. The transverse colon and right colon are decompressed. There is a small volume of free fluid about the cecum. Appendix appears to be diminutive or absent. Negative terminal ileum. No dilated small bowel. Questionable widespread small bowel mucosal hyper enhancement, and there does appear to be mild mesenteric stranding associated with left lateral abdominal small bowel loops (coronal images 30 through 37) where there is also a small volume of free fluid. Mild fluid distension of the stomach and distal esophagus with small sliding-type hiatal hernia suspected. Suggestion of mild wall where there is thickening in the duodenum mild adjacent inflammatory stranding (series 3, image 33). No pneumoperitoneum. There is rectus muscle diastases in the midline of the lower abdomen but no bona fide or incarcerated hernia. No other mesenteric stranding identified. Vascular/Lymphatic: Aortoiliac calcified atherosclerosis noted. Major arterial structures are patent. Portal venous system is patent. New line no lymphadenopathy. Reproductive: Surgically absent as in 2014. Other: Small volume of simple density free fluid in the pelvis (series 3, image 64). Musculoskeletal: Osteopenia. Previous lower lumbar spine fusion changes. The  hardware appears stable since 2014, as does a chronic postoperative fluid collection at the laminectomy space (probably a seroma). Partially visible previous median sternotomy. No lower rib fracture identified. Pelvis appears stable and intact. Chronic pubic symphysis degeneration. No acute osseous abnormality identified. IMPRESSION: 1. Suspicion of generalized small bowel inflammation, most apparent in the proximal duodenum and loops of jejunum in the left lateral abdomen. Favor infectious enteritis in this clinical setting. No dilated bowel loops. No definite colonic inflammation, although there is a small volume of free fluid adjacent to the cecum. Superimposed intermittent large bowel diverticulosis 2. Small volume of free fluid in the abdomen and pelvis is nonspecific but favored due to #1. No free air. 3. No other inflammatory process identified. 4. Calcified aortic atherosclerosis. Mild cardiomegaly and lung base atelectasis. Chronic postoperative changes to the lumbar spine appears stable since 2014. Electronically Signed   By: Odessa Fleming M.D.   On: 06/02/2016 17:36   ASSESSMENT AND PLAN:   Alexandria Marquez  is a 71 y.o. female with a known history of DM, HTN, CAD here with Nausea, vomiting and diarrhea since afternoon. She passed out on th toilet and twisted her ankle. She has a left ankle fracture. Here she has been found to have enteritis on Ct abd, dehdyartion, WBC 33K.  * Enteritis Start cipro and falgyl Check Stool PCR and C diff---not able to give stool sample yet!  * Left ankle fracture Seen by orthopedics Dr. Martha Clan in the emergency room. Needs outpatient follow-up in 1 week for surgery. Nonweightbearing. Consult physical therapy. Pain medications added.  * Dehydration due to vomiting and diarrhea. -resolved with IVF  * Hypomagnesemia. Replace through IV.  * Syncope due to severe dehydration. Troponin normal.   * Diabetes mellitus. Continue glipizide. Hold Actos and metformin.  Sliding scale insulin.  * Hypertension. Continue medications.  * DVT prophylaxis with Lovenox.  Case discussed with Care Management/Social Worker. Management plans discussed with the patient, family and they are in agreement.  CODE STATUS: full  DVT Prophylaxis: lovenox  TOTAL TIME TAKING CARE OF THIS PATIENT: 30 minutes.  >50% time spent on counselling and coordination of care pt and husband POSSIBLE D/C IN 1* DAYS, DEPENDING ON CLINICAL CONDITION.  Note: This dictation was prepared with Dragon dictation along with smaller phrase technology. Any transcriptional errors that result from this process are unintentional.  Mallory Enriques M.D on 06/03/2016 at 2:19 PM  Between 7am to 6pm - Pager - [phone]  After 6pm go to [email or web address] - Social research officer, government  [facility]  Office  [phone]  CC: Primary care physician; Leotis Shames, MD

## 2016-06-03 NOTE — Care Management Note (Addendum)
Case Management Note  Patient Details  Name: Alexandria Marquez MRN: [id number] Date of Birth: 09/20/1945  Subjective/Objective:                   Met with patient to discuss discharge planning. She is from home with her husband and would like to return home however she is concerned about getting into her home with two stairs at both entrances. She has a rolling walker, rollator, and crutches available for use. She uses [facility] for Medications. She has a left ankle fracture and followed by Dr. Thornton Park.  Action/Plan: Home health list left with patient for review. Patient is making arrangements with [facility] for home ramp to get into the home. She would like also [facility] use [facility] for [facility] health [facility]. She has declined SNF. Corene Cornea with [facility] updated on patient request.  Expected Discharge Date:  06/04/16               Expected Discharge Plan:     In-House Referral:     Discharge planning Services  CM Consult  Post Acute Care Choice:  Home Health Choice offered to:  Patient, Spouse  DME Arranged:    DME Agency:     HH Arranged:  PT HH Agency:     Status of Service:  In process, will continue to follow  If discussed at Long Length of Stay Meetings, dates discussed:    Additional Comments:  Marshell Garfinkel, RN 06/03/2016, 11:09 AM

## 2016-06-03 NOTE — Progress Notes (Signed)
Pt assissted with staff and walker to bedside commode, and then to bed after pt ate. Pt tolerated all well, resting with l leg elevated at this time. Pt has piv removed earlier for beginning infiltrate/co pain to site, Dr. Allena KatzPatel notified and order received to d/c ivf. Pt plannign for d/c tomorrow.

## 2016-06-03 NOTE — Progress Notes (Signed)
PT is recommending SNF. Clinical Social Worker (CSW) met with patient and made her aware. Patient reported that she is all set on going home and home health is set up with [facility]. Please reconsult if future social work needs arise. CSW signing off.   [employer], LCSW [phone]

## 2016-06-03 NOTE — Progress Notes (Signed)
Shift assessment completed. Pt is awake, alert and oriented, in no distress. Pt is on room air, lungs are clear, telebox intact, Sr noted. Abdomen is soft, bs heard. piv #20 intact to rac, ivf ns infusing with 20 meq kcl per md order. Pt's L leg has a splint intact with ace wrap, this writer elevated L leg, cap refill is wnl. Pt stated her last stool was in the Er, pt is aware of needing surgery on outpatient bases. Case mgt has also visited pt. Call bell in reach.

## 2016-06-04 LAB — HEMOGLOBIN A1C
Hgb A1c MFr Bld: 7.2 % — ABNORMAL HIGH (ref 4.8–5.6)
Mean Plasma Glucose: 160 mg/dL

## 2016-06-04 LAB — GLUCOSE, CAPILLARY
Glucose-Capillary: 122 mg/dL — ABNORMAL HIGH (ref 65–99)
Glucose-Capillary: 169 mg/dL — ABNORMAL HIGH (ref 65–99)

## 2016-06-04 MED ORDER — OXYCODONE-ACETAMINOPHEN 5-325 MG PO TABS: 1.0000 | ORAL_TABLET | Freq: Four times a day (QID) | ORAL | 0 refills | Status: DC | PRN

## 2016-06-04 MED ORDER — CIPROFLOXACIN HCL 500 MG PO TABS: 500.0000 mg | ORAL_TABLET | Freq: Two times a day (BID) | ORAL | 0 refills | Status: DC

## 2016-06-04 MED ORDER — METRONIDAZOLE 500 MG PO TABS: 500.0000 mg | ORAL_TABLET | Freq: Three times a day (TID) | ORAL | 0 refills | Status: DC

## 2016-06-04 NOTE — Progress Notes (Addendum)
Pt discharged to home via wheelchair accompanied by spouse without incident per MD order. No change in pt from AM assessment. Pt given PO pain med per MD order prior to discharge. All discharge teachings both written and verbal and all questions answered. Pt and spouse verb understanding of all teachings and agree to comply. Pt discharged with prescription for Percocet.

## 2016-06-04 NOTE — Care Management Note (Addendum)
Case Management Note  Patient Details  Name: Alexandria MazeJudy W Marquez MRN: [id number] Date of Birth: 05/31/1945  Subjective/Objective:  [facility] in to speak with patient and spouse regarding home health PT and ramp. No other needs identified. Case closed.                  Action/Plan:   Expected Discharge Date:  06/04/16               Expected Discharge Plan:     In-House Referral:     Discharge planning Services  CM Consult  Post Acute Care Choice:  Home Health Choice offered to:  Patient, Spouse  DME Arranged:    DME Agency:     HH Arranged:  PT HH Agency:  [facility]  Status of Service:  Completed, signed off  If discussed at Long Length of Stay Meetings, dates discussed:    Additional Comments:  Marily MemosLisa M Alaisa Moffitt, RN 06/04/2016, 10:58 AM

## 2016-06-04 NOTE — Progress Notes (Signed)
Physical Therapy Treatment Patient Details Name: Alexandria Marquez MRN: [id number] DOB: 16-Jun-1945 Today's Date: 06/04/2016    History of Present Illness Pt is a 71 y.o. female with a known history of DM, HTN, CAD here with Nausea, vomiting and diarrhea.  She passed out on th toilet and twisted her ankle. She has a left ankle fracture with planned surgical repair in 1-2 weeks.    PT Comments    To edge of bed with rail and supervision.  Transferred to/from commode with stand pivot transfer and to chair at end of session.  Declined use of RW for transfer.  Pt was able to stand at bedside for 2 minutes with walker.  Participated in exercises as described below.  Discussed discharge plan, home set up and overall safety considerations.  Pt encouraged to have husband with her during transfers for safety and to continue with HEP and static standing with walker to maintain strength and mobility skills.  She agreed.  She is refusing SNF as recommended.  Husband to obtain wheelchair and they are working on a ramp to get in/out of her home.  She stated she is comfortable with discharge plan.    She did have some dizziness after standing.  She stated she gets dizzy at home occasionally.  Attributes this to her medications.  Discussed safety strategies.   Follow Up Recommendations  SNF     Equipment Recommendations       Recommendations for Other Services       Precautions / Restrictions Precautions Precautions: Fall Restrictions Weight Bearing Restrictions: Yes LLE Weight Bearing: Non weight bearing    Mobility  Bed Mobility Overal bed mobility: Needs Assistance Bed Mobility: Supine to Sit;Sit to Supine     Supine to sit: Supervision Sit to supine: Supervision   General bed mobility comments: Increased time and effort with bed mobility tasks  Transfers Overall transfer level: Needs assistance Equipment used: None Transfers: Stand Pivot Transfers Sit to Stand: Min guard Stand pivot  transfers: Min guard       General transfer comment: Min verbal cues for sequencing during transfers and to maintain WB status; extra time and effort required with sit to stand but no physical assistance needed.  Ambulation/Gait             General Gait Details: unable to perform.  Static standing x 2 minutes NWB LLE   Stairs            Wheelchair Mobility    Modified Rankin (Stroke Patients Only)       Balance Overall balance assessment: Needs assistance Sitting-balance support: No upper extremity supported Sitting balance-Leahy Scale: Normal     Standing balance support: Bilateral upper extremity supported Standing balance-Leahy Scale: Fair                      Cognition Arousal/Alertness: Awake/alert Behavior During Therapy: WFL for tasks assessed/performed Overall Cognitive Status: Within Functional Limits for tasks assessed                      Exercises Other Exercises Other Exercises: HEP review, Seated LAQ and marches X 20 bialterally    General Comments        Pertinent Vitals/Pain Pain Assessment: No/denies pain    Home Living                      Prior Function  PT Goals (current goals can now be found in the care plan section) Progress towards PT goals: Progressing toward goals    Frequency    7X/week      PT Plan      Co-evaluation             End of Session Equipment Utilized During Treatment: Gait belt Activity Tolerance: Patient tolerated treatment well Patient left: in chair;with chair alarm set;with call bell/phone within reach         Time: [id number] PT Time Calculation (min) (ACUTE ONLY): 24 min  Charges:  $Therapeutic Exercise: 8-22 mins $Therapeutic Activity: 8-22 mins                    G Codes:       Danielle DessSarah Anjana Cheek 06/04/2016, 9:10 AM

## 2016-06-04 NOTE — Discharge Summary (Addendum)
[facility] - [place] at [facility]   PATIENT NAME: Alexandria Marquez    MR#:  [id number]  DATE OF BIRTH:  1946-03-17  DATE OF ADMISSION:  06/02/2016 ADMITTING PHYSICIAN: Milagros Loll, MD  DATE OF DISCHARGE: 06/04/16  PRIMARY CARE PHYSICIAN: Singh,Jasmine, MD    ADMISSION DIAGNOSIS:  Dehydration [E86.0] Hypomagnesemia [E83.42] Enteritis [K52.9] AKI (acute kidney injury) (HCC) [N17.9] Closed trimalleolar fracture of left ankle, initial encounter [S82.852A] Syncope, unspecified syncope type [R55]  DISCHARGE DIAGNOSIS:  Acute Enteritis Left Ankle (trimalleolar) fracture s/p splint  SECONDARY DIAGNOSIS:   Past Medical History:  Diagnosis Date  . Anxiety   . Arthritis   . Asthma    when young  . Coronary artery disease   . Depression   . Diabetes mellitus without complication (HCC)   . GERD (gastroesophageal reflux disease)   . History of hiatal hernia   . Hypertension   . Hypothyroidism   . Motion sickness   . Scoliosis   . Shortness of breath dyspnea    doe    HOSPITAL COURSE:  JudyJonesis a 71 y.o.femalewith a known history of DM, HTN, CAD here with Nausea, vomiting and diarrhea since afternoon. She passed out on th toilet and twisted her ankle. She has a left ankle fracture. Here she has been found to have enteritis on Ct abd, dehdyartion, WBC 33K.  * Enteritis Start cipro and flagyl Check Stool PCR and C diff---not able to give stool sample yet!  * Left ankle fracture Seen by orthopedics Dr. Martha Clan in the emergency room. Needs outpatient follow-up in 1 week for surgery. Nonweightbearing. Consulted physical therapy--recommends rehab but pt and husband request HHPT - Pain medications added.  * Dehydration due to vomiting and diarrhea. -resolved with IVF  * Hypomagnesemia. Replace through IV.  * Syncope due to severe dehydration. Troponin normal.   * Diabetes mellitus. Continue glipizide. Hold Actos and metformin. Sliding  scale insulin.  * Hypertension. Continue medications.  * DVT prophylaxis with Lovenox.  D/c home with HHPT  CONSULTS OBTAINED:  Treatment Team:  Juanell Fairly, MD  DRUG ALLERGIES:   Allergies  Allergen Reactions  . Morphine And Related Nausea And Vomiting  . Penicillins Hives    Has patient had a PCN reaction causing immediate rash, facial/tongue/throat swelling, SOB or lightheadedness with hypotension: No Has patient had a PCN reaction causing severe rash involving mucus membranes or skin necrosis: No Has patient had a PCN reaction that required hospitalization No Has patient had a PCN reaction occurring within the last 10 years: No If all of the above answers are "NO", then may proceed with Cephalosporin use.     DISCHARGE MEDICATIONS:   Current Discharge Medication List    START taking these medications   Details  ciprofloxacin (CIPRO) 500 MG tablet Take 1 tablet (500 mg total) by mouth 2 (two) times daily. Qty: 10 tablet, Refills: 0    metroNIDAZOLE (FLAGYL) 500 MG tablet Take 1 tablet (500 mg total) by mouth 3 (three) times daily. Qty: 15 tablet, Refills: 0    ondansetron (ZOFRAN) 4 MG tablet Take 1 tablet (4 mg total) by mouth every 8 (eight) hours as needed for nausea or vomiting. Qty: 20 tablet, Refills: 0    oxyCODONE-acetaminophen (ROXICET) 5-325 MG tablet Take 1 tablet by mouth every 6 (six) hours as needed. Qty: 25 tablet, Refills: 0      CONTINUE these medications which have NOT CHANGED   Details  aspirin EC 81 MG tablet Take 81 mg  by mouth daily.    DULoxetine (CYMBALTA) 60 MG capsule Take 60 mg by mouth daily.    enalapril (VASOTEC) 20 MG tablet Take 20 mg by mouth 2 (two) times daily.    fluticasone (FLONASE) 50 MCG/ACT nasal spray Place 2 sprays into both nostrils daily.    gabapentin (NEURONTIN) 600 MG tablet Take 600 mg by mouth at bedtime.    glimepiride (AMARYL) 4 MG tablet Take 4 mg by mouth 2 (two) times daily.    levothyroxine  (SYNTHROID, LEVOTHROID) 75 MCG tablet Take 75 mcg by mouth daily before breakfast.    meloxicam (MOBIC) 7.5 MG tablet Take 7.5 mg by mouth 2 (two) times daily.    metFORMIN (GLUCOPHAGE) 1000 MG tablet Take 1,000 mg by mouth 2 (two) times daily with a meal.    pantoprazole (PROTONIX) 40 MG tablet Take 40 mg by mouth 2 (two) times daily.    pioglitazone (ACTOS) 45 MG tablet Take 45 mg by mouth daily.     simvastatin (ZOCOR) 40 MG tablet Take 40 mg by mouth daily at 6 PM.    traMADol (ULTRAM) 50 MG tablet Take 1-2 tablets (50-100 mg total) by mouth every 4 (four) hours as needed for moderate pain. Qty: 60 tablet, Refills: 1      STOP taking these medications     enoxaparin (LOVENOX) 40 MG/0.4ML injection      oxyCODONE (OXY IR/ROXICODONE) 5 MG immediate release tablet         If you experience worsening of your admission symptoms, develop shortness of breath, life threatening emergency, suicidal or homicidal thoughts you must seek medical attention immediately by calling 911 or calling your MD immediately  if symptoms less severe.  You Must read complete instructions/literature along with all the possible adverse reactions/side effects for all the Medicines you take and that have been prescribed to you. Take any new Medicines after you have completely understood and accept all the possible adverse reactions/side effects.   Please note  You were cared for by a hospitalist during your hospital stay. If you have any questions about your discharge medications or the care you received while you were in the hospital after you are discharged, you can call the unit and asked to speak with the hospitalist on call if the hospitalist that took care of you is not available. Once you are discharged, your primary care physician will handle any further medical issues. Please note that NO REFILLS for any discharge medications will be authorized once you are discharged, as it is imperative that you return  to your primary care physician (or establish a relationship with a primary care physician if you do not have one) for your aftercare needs so that they can reassess your need for medications and monitor your lab values. Today   SUBJECTIVE   No new complaints  VITAL SIGNS:  Blood pressure (!) 194/55, pulse 61, temperature 97.1 F (36.2 C), temperature source Axillary, resp. rate 18, height 5' (1.524 m), weight 83.5 kg (184 lb), SpO2 100 %.  I/O:    Intake/Output Summary (Last 24 hours) at 06/04/16 1452 Last data filed at 06/04/16 0800  Gross per 24 hour  Intake              940 ml  Output                0 ml  Net              940 ml  PHYSICAL EXAMINATION:  GENERAL:  71 y.o.-year-old patient lying in the bed with no acute distress.  EYES: Pupils equal, round, reactive to light and accommodation. No scleral icterus. Extraocular muscles intact.  HEENT: Head atraumatic, normocephalic. Oropharynx and nasopharynx clear.  NECK:  Supple, no jugular venous distention. No thyroid enlargement, no tenderness.  LUNGS: Normal breath sounds bilaterally, no wheezing, rales,rhonchi or crepitation. No use of accessory muscles of respiration.  CARDIOVASCULAR: S1, S2 normal. No murmurs, rubs, or gallops.  ABDOMEN: Soft, non-tender, non-distended. Bowel sounds present. No organomegaly or mass.  EXTREMITIES: No pedal edema, cyanosis, or clubbing. Left ankle splint+ NEUROLOGIC: Cranial nerves II through XII are intact. Muscle strength 5/5 in all extremities. Sensation intact. Gait not checked.  PSYCHIATRIC: The patient is alert and oriented x 3.  SKIN: No obvious rash, lesion, or ulcer.   DATA REVIEW:   CBC   Recent Labs Lab 06/03/16 0405  WBC 18.2*  HGB 12.6  HCT 36.9  PLT 299    Chemistries   Recent Labs Lab 06/03/16 0405  NA 136  K 4.3  CL 106  CO2 23  GLUCOSE 103*  BUN 18  CREATININE 0.87  CALCIUM 8.8*  MG 1.9  AST 19  ALT 13*  ALKPHOS 48  BILITOT 0.8    Microbiology  Results   No results found for this or any previous visit (from the past 240 hour(s)).  RADIOLOGY:  Dg Ankle Complete Left  Result Date: 06/02/2016 CLINICAL DATA:  Fall. EXAM: LEFT ANKLE COMPLETE - 3+ VIEW COMPARISON:  No recent. FINDINGS: Slight displaced fracture of the medial malleolus noted. Displaced angulated fracture of the distal fibula. Slight displaced fracture of the posterior malleolus. Disruption of the tibial talar joint . IMPRESSION: Fractures of the medial and posterior malleolus. Angulated displaced fracture of the distal fibula. Disruption of the tibial talar joint. Electronically Signed   By: Maisie Fus  Register   On: 06/02/2016 16:43   Ct Head Wo Contrast  Result Date: 06/02/2016 CLINICAL DATA:  Nausea, vomiting, and diarrhea since noon today, fell for from toilet run on reason, denies loss of consciousness, chest discomfort, history CABG, back pain post fall, diabetes mellitus, hypertension EXAM: CT HEAD WITHOUT CONTRAST TECHNIQUE: Contiguous axial images were obtained from the base of the skull through the vertex without intravenous contrast. Sagittal and coronal MPR images reconstructed from axial data set. COMPARISON:  None FINDINGS: Brain: Generalized atrophy of cerebral hemispheres and cerebellum. Normal ventricular morphology. No midline shift or mass effect. No intracranial hemorrhage, mass lesion or evidence acute infarction. No extra-axial fluid collections. Vascular: Mild atherosclerotic calcification at the carotid siphons Skull: Intact though mildly demineralized Sinuses/Orbits: Clear Other: N/A IMPRESSION: Generalized atrophy. No acute intracranial abnormalities. Electronically Signed   By: Ulyses Southward M.D.   On: 06/02/2016 16:29   Ct Abdomen Pelvis W Contrast  Result Date: 06/02/2016 CLINICAL DATA:  71 year old female with abdominal pain nausea vomiting diarrhea and leukocytosis. Status post fall today. EXAM: CT ABDOMEN AND PELVIS WITH CONTRAST TECHNIQUE: Multidetector  CT imaging of the abdomen and pelvis was performed using the standard protocol following bolus administration of intravenous contrast. CONTRAST:  75mL ISOVUE-300 IOPAMIDOL (ISOVUE-300) INJECTION 61% COMPARISON:  Lumbar MRI 10/01/2014. CT Abdomen and Pelvis 08/01/2012 and earlier. FINDINGS: Lower chest: Lower lung volumes today compared to 2014. Calcified granulomas in the left lower lobe. Mild peribronchial and dependent atelectasis. No pleural effusion. No suspicious lung base opacity. Cardiomegaly. No pericardial effusion. Hepatobiliary: Chronic surgically absent gallbladder. Negative liver aside from a small volume  of free fluid adjacent to the dome (series 3, image 8). Intra and extrahepatic biliary ducts appear stable since 2014. Pancreas: Fatty atrophied, stable since 2014. Spleen: Negative.  No adjacent free fluid. Adrenals/Urinary Tract: Normal adrenal glands. Bilateral renal enhancement and contrast excretion is within normal limits. Kidneys appear stable since 2014. No hydroureter. Decompressed and unremarkable urinary bladder. Multiple pelvic phleboliths. Stomach/Bowel: Decompressed and negative rectum. Redundant sigmoid colon with moderate diverticulosis of the proximal sigmoid continuing into the distal descending colon. No definite active inflammation. There is a trace amount of free fluid adjacent to the distal sigmoid. The left colon is decompressed with mild diverticulosis also at the splenic flexure. No active inflammation. The transverse colon and right colon are decompressed. There is a small volume of free fluid about the cecum. Appendix appears to be diminutive or absent. Negative terminal ileum. No dilated small bowel. Questionable widespread small bowel mucosal hyper enhancement, and there does appear to be mild mesenteric stranding associated with left lateral abdominal small bowel loops (coronal images 30 through 37) where there is also a small volume of free fluid. Mild fluid distension of  the stomach and distal esophagus with small sliding-type hiatal hernia suspected. Suggestion of mild wall where there is thickening in the duodenum mild adjacent inflammatory stranding (series 3, image 33). No pneumoperitoneum. There is rectus muscle diastases in the midline of the lower abdomen but no bona fide or incarcerated hernia. No other mesenteric stranding identified. Vascular/Lymphatic: Aortoiliac calcified atherosclerosis noted. Major arterial structures are patent. Portal venous system is patent. New line no lymphadenopathy. Reproductive: Surgically absent as in 2014. Other: Small volume of simple density free fluid in the pelvis (series 3, image 64). Musculoskeletal: Osteopenia. Previous lower lumbar spine fusion changes. The hardware appears stable since 2014, as does a chronic postoperative fluid collection at the laminectomy space (probably a seroma). Partially visible previous median sternotomy. No lower rib fracture identified. Pelvis appears stable and intact. Chronic pubic symphysis degeneration. No acute osseous abnormality identified. IMPRESSION: 1. Suspicion of generalized small bowel inflammation, most apparent in the proximal duodenum and loops of jejunum in the left lateral abdomen. Favor infectious enteritis in this clinical setting. No dilated bowel loops. No definite colonic inflammation, although there is a small volume of free fluid adjacent to the cecum. Superimposed intermittent large bowel diverticulosis 2. Small volume of free fluid in the abdomen and pelvis is nonspecific but favored due to #1. No free air. 3. No other inflammatory process identified. 4. Calcified aortic atherosclerosis. Mild cardiomegaly and lung base atelectasis. Chronic postoperative changes to the lumbar spine appears stable since 2014. Electronically Signed   By: Odessa FlemingH  Hall M.D.   On: 06/02/2016 17:36     Management plans discussed with the patient, family and they are in agreement.  CODE STATUS:      Code Status Orders        Start     Ordered   06/02/16 2122  Full code  Continuous     06/02/16 2122    Code Status History    Date Active Date Inactive Code Status Order ID Comments User Context   12/19/2015 11:14 AM 12/22/2015 10:35 PM Full Code [id number]  Christena FlakeJohn J Poggi, MD Inpatient      TOTAL TIME TAKING CARE OF THIS PATIENT: 40 minutes.    Shamere Campas M.D on 06/04/2016 at 2:52 PM  Between 7am to 6pm - Pager - [phone] After 6pm go to [email or web address] - password [email or web address]  [facility]  Office  [phone]  CC: Primary care physician; Leotis Shames, MD

## 2016-06-18 ENCOUNTER — Other Ambulatory Visit: Payer: Self-pay | Admitting: Orthopedic Surgery

## 2016-06-18 ENCOUNTER — Encounter
Admission: RE | Admit: 2016-06-18 | Discharge: 2016-06-18 | Disposition: A | Discharge status: Home / Self Care | Payer: Medicare Other | Source: Ambulatory Visit | Attending: Orthopedic Surgery | Admitting: Orthopedic Surgery

## 2016-06-18 DIAGNOSIS — K449 Diaphragmatic hernia without obstruction or gangrene: Secondary | ICD-10-CM | POA: Diagnosis not present

## 2016-06-18 DIAGNOSIS — X58XXXA Exposure to other specified factors, initial encounter: Secondary | ICD-10-CM | POA: Diagnosis not present

## 2016-06-18 DIAGNOSIS — F419 Anxiety disorder, unspecified: Secondary | ICD-10-CM | POA: Diagnosis not present

## 2016-06-18 DIAGNOSIS — E039 Hypothyroidism, unspecified: Secondary | ICD-10-CM | POA: Diagnosis not present

## 2016-06-18 DIAGNOSIS — I2510 Atherosclerotic heart disease of native coronary artery without angina pectoris: Secondary | ICD-10-CM | POA: Diagnosis not present

## 2016-06-18 DIAGNOSIS — Z7984 Long term (current) use of oral hypoglycemic drugs: Secondary | ICD-10-CM | POA: Diagnosis not present

## 2016-06-18 DIAGNOSIS — Z880 Allergy status to penicillin: Secondary | ICD-10-CM | POA: Diagnosis not present

## 2016-06-18 DIAGNOSIS — Z7982 Long term (current) use of aspirin: Secondary | ICD-10-CM | POA: Diagnosis not present

## 2016-06-18 DIAGNOSIS — K219 Gastro-esophageal reflux disease without esophagitis: Secondary | ICD-10-CM | POA: Diagnosis not present

## 2016-06-18 DIAGNOSIS — Z96651 Presence of right artificial knee joint: Secondary | ICD-10-CM | POA: Diagnosis not present

## 2016-06-18 DIAGNOSIS — M1990 Unspecified osteoarthritis, unspecified site: Secondary | ICD-10-CM | POA: Diagnosis not present

## 2016-06-18 DIAGNOSIS — M419 Scoliosis, unspecified: Secondary | ICD-10-CM | POA: Diagnosis not present

## 2016-06-18 DIAGNOSIS — S82842A Displaced bimalleolar fracture of left lower leg, initial encounter for closed fracture: Secondary | ICD-10-CM | POA: Diagnosis not present

## 2016-06-18 DIAGNOSIS — I10 Essential (primary) hypertension: Secondary | ICD-10-CM | POA: Diagnosis not present

## 2016-06-18 DIAGNOSIS — E119 Type 2 diabetes mellitus without complications: Secondary | ICD-10-CM | POA: Diagnosis not present

## 2016-06-18 DIAGNOSIS — F329 Major depressive disorder, single episode, unspecified: Secondary | ICD-10-CM | POA: Diagnosis not present

## 2016-06-18 DIAGNOSIS — Z885 Allergy status to narcotic agent status: Secondary | ICD-10-CM | POA: Diagnosis not present

## 2016-06-18 DIAGNOSIS — J45909 Unspecified asthma, uncomplicated: Secondary | ICD-10-CM | POA: Diagnosis not present

## 2016-06-18 HISTORY — DX: Localized edema: R60.0

## 2016-06-18 HISTORY — DX: Other allergic rhinitis: J30.89

## 2016-06-18 MED ORDER — CLINDAMYCIN PHOSPHATE 600 MG/50ML IV SOLN
600.0000 mg | Freq: Once | INTRAVENOUS | Status: AC
  Administered 2016-06-19: 600 mg via INTRAVENOUS

## 2016-06-18 MED ORDER — CHLORHEXIDINE GLUCONATE CLOTH 2 % EX PADS
6.0000 | MEDICATED_PAD | Freq: Once | CUTANEOUS | Status: DC
  Filled 2016-06-18: qty 6

## 2016-06-18 NOTE — Pre-Procedure Instructions (Signed)
Cardiac clearance on chart. 

## 2016-06-18 NOTE — Pre-Procedure Instructions (Signed)
Would like a wheelchair before discharge from hospital.

## 2016-06-18 NOTE — Patient Instructions (Addendum)
Your procedure is scheduled on: 06/19/16 Fri At 10:00 Report to Same Day Surgery 2nd floor medical mall [facility](Medical Mall Entrance-take elevator on left to 2nd floor.  Check in with surgery information desk.)   Remember: Instructions that are not followed completely may result in serious medical risk, up to and including death, or upon the discretion of your surgeon and anesthesiologist your surgery may need to be rescheduled.    _x___ 1. Do not eat food or drink liquids after midnight. No gum chewing or                              hard candies.     __x__ 2. No Alcohol for 24 hours before or after surgery.   __x__3. No Smoking for 24 prior to surgery.   ____  4. Bring all medications with you on the day of surgery if instructed.    __x__ 5. Notify your doctor if there is any change in your medical condition     (cold, fever, infections).     Do not wear jewelry, make-up, hairpins, clips or nail polish.  Do not wear lotions, powders, or perfumes. You may wear deodorant.  Do not shave 48 hours prior to surgery. Men may shave face and neck.  Do not bring valuables to the hospital.    [facility] any belongings or valuables.               Contacts, dentures or bridgework may not be worn into surgery.  Leave your suitcase in the car. After surgery it may be brought to your room.  For patients admitted to the hospital, discharge time is determined by your                       treatment team.   Patients discharged the day of surgery will not be allowed to drive home.  You will need someone to drive you home and stay with you the night of your procedure.    Please read over the following fact sheets that you were given:   [facility] Preparing for [facility] and or MRSA Information   _x___ Take anti-hypertensive (unless it includes a diuretic), cardiac, seizure, asthma,     anti-reflux and psychiatric medicines. These include:  1. enalapril (VASOTEC  2.levothyroxine  (SYNTHROID  3.pantoprazole (PROTONIX  4.oxyCODONE-acetaminophen if needed  5.  6.  ____Fleets enema or Magnesium Citrate as directed.   _x___ Use CHG Soap or sage wipes as directed on instruction sheet   ____ Use inhalers on the day of surgery and bring to hospital day of surgery  __x__ Stop Metformin today..    ____ Take 1/2 of usual insulin dose the night before surgery and none on the morning     surgery.   _x___ Follow recommendations from Cardiologist, Pulmonologist or PCP regarding          stopping Aspirin, Coumadin, Pllavix ,Eliquis, Effient, or Pradaxa, and Pletal. Stopped Aspirin on Monday  X____Stop Anti-inflammatories such as Advil, Aleve, Ibuprofen, Motrin, Naproxen, Naprosyn, Goodies powders or aspirin products. Stopped Meloxicam today.  OK to take Tylenol and                          Celebrex.   _x___ Stop supplements until after surgery.  But may continue Vitamin D, Vitamin B,       and  multivitamin.   ____ Bring C-Pap to the hospital.

## 2016-06-19 ENCOUNTER — Encounter
Admission: RE | Disposition: A | Discharge status: Home / Self Care | Payer: Self-pay | Source: Ambulatory Visit | Attending: Orthopedic Surgery

## 2016-06-19 ENCOUNTER — Observation Stay: Payer: Medicare Other

## 2016-06-19 ENCOUNTER — Observation Stay
Admission: RE | Admit: 2016-06-19 | Discharge: 2016-06-20 | Disposition: A | Discharge status: Home / Self Care | Payer: Medicare Other | Source: Ambulatory Visit | Attending: Orthopedic Surgery | Admitting: Orthopedic Surgery

## 2016-06-19 ENCOUNTER — Ambulatory Visit: Payer: Medicare Other | Admitting: Anesthesiology

## 2016-06-19 ENCOUNTER — Encounter: Payer: Self-pay | Admitting: *Deleted

## 2016-06-19 DIAGNOSIS — S82842A Displaced bimalleolar fracture of left lower leg, initial encounter for closed fracture: Secondary | ICD-10-CM | POA: Diagnosis not present

## 2016-06-19 DIAGNOSIS — E119 Type 2 diabetes mellitus without complications: Secondary | ICD-10-CM | POA: Insufficient documentation

## 2016-06-19 DIAGNOSIS — M419 Scoliosis, unspecified: Secondary | ICD-10-CM | POA: Insufficient documentation

## 2016-06-19 DIAGNOSIS — J45909 Unspecified asthma, uncomplicated: Secondary | ICD-10-CM | POA: Insufficient documentation

## 2016-06-19 DIAGNOSIS — E039 Hypothyroidism, unspecified: Secondary | ICD-10-CM | POA: Insufficient documentation

## 2016-06-19 DIAGNOSIS — F329 Major depressive disorder, single episode, unspecified: Secondary | ICD-10-CM | POA: Insufficient documentation

## 2016-06-19 DIAGNOSIS — Z96651 Presence of right artificial knee joint: Secondary | ICD-10-CM | POA: Insufficient documentation

## 2016-06-19 DIAGNOSIS — F419 Anxiety disorder, unspecified: Secondary | ICD-10-CM | POA: Insufficient documentation

## 2016-06-19 DIAGNOSIS — K449 Diaphragmatic hernia without obstruction or gangrene: Secondary | ICD-10-CM | POA: Insufficient documentation

## 2016-06-19 DIAGNOSIS — Z7982 Long term (current) use of aspirin: Secondary | ICD-10-CM | POA: Insufficient documentation

## 2016-06-19 DIAGNOSIS — Z7984 Long term (current) use of oral hypoglycemic drugs: Secondary | ICD-10-CM | POA: Insufficient documentation

## 2016-06-19 DIAGNOSIS — K219 Gastro-esophageal reflux disease without esophagitis: Secondary | ICD-10-CM | POA: Insufficient documentation

## 2016-06-19 DIAGNOSIS — X58XXXA Exposure to other specified factors, initial encounter: Secondary | ICD-10-CM | POA: Insufficient documentation

## 2016-06-19 DIAGNOSIS — I10 Essential (primary) hypertension: Secondary | ICD-10-CM | POA: Insufficient documentation

## 2016-06-19 DIAGNOSIS — Z880 Allergy status to penicillin: Secondary | ICD-10-CM | POA: Insufficient documentation

## 2016-06-19 DIAGNOSIS — Z885 Allergy status to narcotic agent status: Secondary | ICD-10-CM | POA: Insufficient documentation

## 2016-06-19 DIAGNOSIS — I2510 Atherosclerotic heart disease of native coronary artery without angina pectoris: Secondary | ICD-10-CM | POA: Insufficient documentation

## 2016-06-19 DIAGNOSIS — M1990 Unspecified osteoarthritis, unspecified site: Secondary | ICD-10-CM | POA: Insufficient documentation

## 2016-06-19 HISTORY — PX: ORIF ANKLE FRACTURE: SHX5408

## 2016-06-19 LAB — PROTIME-INR
INR: 1.1
Prothrombin Time: 14.2 seconds (ref 11.4–15.2)

## 2016-06-19 LAB — APTT: aPTT: 26 seconds (ref 24–36)

## 2016-06-19 LAB — GLUCOSE, CAPILLARY
Glucose-Capillary: 140 mg/dL — ABNORMAL HIGH (ref 65–99)
Glucose-Capillary: 128 mg/dL — ABNORMAL HIGH (ref 65–99)
Glucose-Capillary: 174 mg/dL — ABNORMAL HIGH (ref 65–99)

## 2016-06-19 SURGERY — OPEN REDUCTION INTERNAL FIXATION (ORIF) ANKLE FRACTURE
Anesthesia: General | Site: Ankle | Laterality: Left | Wound class: Clean

## 2016-06-19 MED ORDER — MAGNESIUM CITRATE PO SOLN
1.0000 | Freq: Once | ORAL | Status: DC | PRN
  Filled 2016-06-19: qty 296

## 2016-06-19 MED ORDER — GLYCOPYRROLATE 0.2 MG/ML IJ SOLN
INTRAMUSCULAR | Status: DC | PRN
  Administered 2016-06-19: 0.2 mg via INTRAVENOUS

## 2016-06-19 MED ORDER — POLYVINYL ALCOHOL 1.4 % OP SOLN
1.0000 [drp] | Freq: Three times a day (TID) | OPHTHALMIC | Status: DC | PRN
  Filled 2016-06-19: qty 15

## 2016-06-19 MED ORDER — PIOGLITAZONE HCL 30 MG PO TABS
30.0000 mg | ORAL_TABLET | Freq: Every day | ORAL | Status: DC
  Administered 2016-06-20: 30 mg via ORAL
  Filled 2016-06-19: qty 1

## 2016-06-19 MED ORDER — ONDANSETRON HCL 4 MG/2ML IJ SOLN: 4.0000 mg | Freq: Four times a day (QID) | INTRAMUSCULAR | Status: DC | PRN

## 2016-06-19 MED ORDER — ALUM & MAG HYDROXIDE-SIMETH 200-200-20 MG/5ML PO SUSP: 30.0000 mL | ORAL | Status: DC | PRN

## 2016-06-19 MED ORDER — DEXMEDETOMIDINE HCL 200 MCG/2ML IV SOLN
INTRAVENOUS | Status: DC | PRN
  Administered 2016-06-19: 16 ug via INTRAVENOUS

## 2016-06-19 MED ORDER — HYDROMORPHONE HCL 1 MG/ML IJ SOLN
INTRAMUSCULAR | Status: DC | PRN
  Administered 2016-06-19 (×2): 0.5 mg via INTRAVENOUS

## 2016-06-19 MED ORDER — FENTANYL CITRATE (PF) 100 MCG/2ML IJ SOLN
25.0000 ug | INTRAMUSCULAR | Status: DC | PRN
  Administered 2016-06-19 (×2): 25 ug via INTRAVENOUS
  Administered 2016-06-19: 50 ug via INTRAVENOUS

## 2016-06-19 MED ORDER — GLIMEPIRIDE 2 MG PO TABS
4.0000 mg | ORAL_TABLET | Freq: Two times a day (BID) | ORAL | Status: DC
  Administered 2016-06-19 – 2016-06-20 (×2): 4 mg via ORAL
  Filled 2016-06-19 (×2): qty 2

## 2016-06-19 MED ORDER — LACTATED RINGERS IV SOLN
INTRAVENOUS | Status: DC | PRN
  Administered 2016-06-19: 12:00:00 via INTRAVENOUS

## 2016-06-19 MED ORDER — LEVOTHYROXINE SODIUM 75 MCG PO TABS
75.0000 ug | ORAL_TABLET | Freq: Every day | ORAL | Status: DC
  Administered 2016-06-20: 75 ug via ORAL
  Filled 2016-06-19: qty 1

## 2016-06-19 MED ORDER — FENTANYL CITRATE (PF) 100 MCG/2ML IJ SOLN
INTRAMUSCULAR | Status: AC
  Filled 2016-06-19: qty 2

## 2016-06-19 MED ORDER — FENTANYL CITRATE (PF) 100 MCG/2ML IJ SOLN
INTRAMUSCULAR | Status: DC | PRN
  Administered 2016-06-19 (×4): 50 ug via INTRAVENOUS

## 2016-06-19 MED ORDER — ROCURONIUM BROMIDE 50 MG/5ML IV SOLN
INTRAVENOUS | Status: AC
  Filled 2016-06-19: qty 1

## 2016-06-19 MED ORDER — METHOCARBAMOL 500 MG PO TABS
500.0000 mg | ORAL_TABLET | Freq: Four times a day (QID) | ORAL | Status: DC | PRN
  Administered 2016-06-19 – 2016-06-20 (×2): 500 mg via ORAL
  Filled 2016-06-19 (×2): qty 1

## 2016-06-19 MED ORDER — BISACODYL 10 MG RE SUPP: 10.0000 mg | Freq: Every day | RECTAL | Status: DC | PRN

## 2016-06-19 MED ORDER — SODIUM CHLORIDE 0.9 % IV SOLN
INTRAVENOUS | Status: DC
  Administered 2016-06-19: 20 mL/h via INTRAVENOUS

## 2016-06-19 MED ORDER — SENNA 8.6 MG PO TABS
1.0000 | ORAL_TABLET | Freq: Two times a day (BID) | ORAL | Status: DC
  Administered 2016-06-19 – 2016-06-20 (×2): 8.6 mg via ORAL
  Filled 2016-06-19 (×2): qty 1

## 2016-06-19 MED ORDER — PHENOL 1.4 % MT LIQD
1.0000 | OROMUCOSAL | Status: DC | PRN
  Filled 2016-06-19: qty 177

## 2016-06-19 MED ORDER — ACETAMINOPHEN 650 MG RE SUPP: 650.0000 mg | Freq: Four times a day (QID) | RECTAL | Status: DC | PRN

## 2016-06-19 MED ORDER — ACETAMINOPHEN 10 MG/ML IV SOLN
INTRAVENOUS | Status: AC
  Filled 2016-06-19: qty 100

## 2016-06-19 MED ORDER — ASPIRIN EC 325 MG PO TBEC
325.0000 mg | DELAYED_RELEASE_TABLET | Freq: Two times a day (BID) | ORAL | Status: DC
  Administered 2016-06-20: 325 mg via ORAL
  Filled 2016-06-19: qty 1

## 2016-06-19 MED ORDER — SODIUM CHLORIDE 0.9 % IV SOLN
75.0000 mL/h | INTRAVENOUS | Status: DC
  Administered 2016-06-19: 75 mL/h via INTRAVENOUS

## 2016-06-19 MED ORDER — DEXMEDETOMIDINE HCL 200 MCG/2ML IV SOLN: INTRAVENOUS | Status: DC | PRN

## 2016-06-19 MED ORDER — NEOMYCIN-POLYMYXIN B GU 40-200000 IR SOLN
Status: AC
  Filled 2016-06-19: qty 4

## 2016-06-19 MED ORDER — SEVOFLURANE IN SOLN
RESPIRATORY_TRACT | Status: AC
  Filled 2016-06-19: qty 250

## 2016-06-19 MED ORDER — ENALAPRIL MALEATE 10 MG PO TABS
20.0000 mg | ORAL_TABLET | Freq: Two times a day (BID) | ORAL | Status: DC
  Administered 2016-06-19 – 2016-06-20 (×2): 20 mg via ORAL
  Filled 2016-06-19 (×2): qty 2

## 2016-06-19 MED ORDER — ACETAMINOPHEN 325 MG PO TABS: 650.0000 mg | ORAL_TABLET | Freq: Four times a day (QID) | ORAL | Status: DC | PRN

## 2016-06-19 MED ORDER — DOCUSATE SODIUM 100 MG PO CAPS
100.0000 mg | ORAL_CAPSULE | Freq: Two times a day (BID) | ORAL | Status: DC
  Administered 2016-06-19 – 2016-06-20 (×2): 100 mg via ORAL
  Filled 2016-06-19 (×2): qty 1

## 2016-06-19 MED ORDER — FENTANYL CITRATE (PF) 100 MCG/2ML IJ SOLN
INTRAMUSCULAR | Status: AC
  Administered 2016-06-19: 50 ug via INTRAVENOUS
  Filled 2016-06-19: qty 2

## 2016-06-19 MED ORDER — OXYCODONE HCL 5 MG/5ML PO SOLN: 5.0000 mg | Freq: Once | ORAL | Status: DC | PRN

## 2016-06-19 MED ORDER — KETOROLAC TROMETHAMINE 15 MG/ML IJ SOLN
15.0000 mg | Freq: Four times a day (QID) | INTRAMUSCULAR | Status: DC
  Administered 2016-06-19 – 2016-06-20 (×4): 15 mg via INTRAVENOUS
  Filled 2016-06-19 (×4): qty 1

## 2016-06-19 MED ORDER — OXYCODONE HCL 5 MG PO TABS: 5.0000 mg | ORAL_TABLET | Freq: Once | ORAL | Status: DC | PRN

## 2016-06-19 MED ORDER — MIDAZOLAM HCL 2 MG/2ML IJ SOLN
INTRAMUSCULAR | Status: AC
  Filled 2016-06-19: qty 2

## 2016-06-19 MED ORDER — HYDRALAZINE HCL 20 MG/ML IJ SOLN
INTRAMUSCULAR | Status: DC | PRN
  Administered 2016-06-19 (×2): 10 mg via INTRAVENOUS

## 2016-06-19 MED ORDER — PANTOPRAZOLE SODIUM 40 MG PO TBEC
40.0000 mg | DELAYED_RELEASE_TABLET | Freq: Two times a day (BID) | ORAL | Status: DC
  Administered 2016-06-19 – 2016-06-20 (×2): 40 mg via ORAL
  Filled 2016-06-19 (×2): qty 1

## 2016-06-19 MED ORDER — ONDANSETRON HCL 4 MG/2ML IJ SOLN
INTRAMUSCULAR | Status: DC | PRN
  Administered 2016-06-19: 4 mg via INTRAVENOUS

## 2016-06-19 MED ORDER — SUCCINYLCHOLINE CHLORIDE 20 MG/ML IJ SOLN
INTRAMUSCULAR | Status: DC | PRN
  Administered 2016-06-19: 100 mg via INTRAVENOUS

## 2016-06-19 MED ORDER — PROPOFOL 10 MG/ML IV BOLUS
INTRAVENOUS | Status: DC | PRN
  Administered 2016-06-19: 120 mg via INTRAVENOUS

## 2016-06-19 MED ORDER — CLINDAMYCIN PHOSPHATE 600 MG/50ML IV SOLN
INTRAVENOUS | Status: AC
  Filled 2016-06-19: qty 50

## 2016-06-19 MED ORDER — PHENYLEPHRINE HCL 10 MG/ML IJ SOLN
INTRAMUSCULAR | Status: DC | PRN
  Administered 2016-06-19 (×3): 100 ug via INTRAVENOUS
  Administered 2016-06-19: 50 ug via INTRAVENOUS

## 2016-06-19 MED ORDER — POLYETHYLENE GLYCOL 3350 17 G PO PACK: 17.0000 g | PACK | Freq: Every day | ORAL | Status: DC | PRN

## 2016-06-19 MED ORDER — CLINDAMYCIN PHOSPHATE 600 MG/50ML IV SOLN
600.0000 mg | Freq: Four times a day (QID) | INTRAVENOUS | Status: AC
  Administered 2016-06-19 – 2016-06-20 (×2): 600 mg via INTRAVENOUS
  Filled 2016-06-19 (×2): qty 50

## 2016-06-19 MED ORDER — HYDROMORPHONE HCL 1 MG/ML IJ SOLN
0.5000 mg | INTRAMUSCULAR | Status: DC | PRN
  Administered 2016-06-19 – 2016-06-20 (×4): 0.5 mg via INTRAVENOUS
  Filled 2016-06-19 (×4): qty 1

## 2016-06-19 MED ORDER — MENTHOL 3 MG MT LOZG
1.0000 | LOZENGE | OROMUCOSAL | Status: DC | PRN
Start: 2016-06-19 — End: 2016-06-20
  Filled 2016-06-19: qty 9

## 2016-06-19 MED ORDER — FLUTICASONE PROPIONATE 50 MCG/ACT NA SUSP
1.0000 | Freq: Every day | NASAL | Status: DC | PRN
  Filled 2016-06-19: qty 16

## 2016-06-19 MED ORDER — ONDANSETRON HCL 4 MG PO TABS: 4.0000 mg | ORAL_TABLET | Freq: Four times a day (QID) | ORAL | Status: DC | PRN

## 2016-06-19 MED ORDER — NEOMYCIN-POLYMYXIN B GU 40-200000 IR SOLN
Status: DC | PRN
  Administered 2016-06-19: 4 mL

## 2016-06-19 MED ORDER — MIDAZOLAM HCL 2 MG/2ML IJ SOLN
INTRAMUSCULAR | Status: DC | PRN
  Administered 2016-06-19: 1 mg via INTRAVENOUS

## 2016-06-19 MED ORDER — SIMVASTATIN 20 MG PO TABS: 40.0000 mg | ORAL_TABLET | Freq: Every evening | ORAL | Status: DC

## 2016-06-19 MED ORDER — OXYCODONE HCL 5 MG PO TABS
5.0000 mg | ORAL_TABLET | ORAL | Status: DC | PRN
  Administered 2016-06-19: 10 mg via ORAL
  Administered 2016-06-19 (×2): 5 mg via ORAL
  Administered 2016-06-20: 10 mg via ORAL
  Filled 2016-06-19: qty 2
  Filled 2016-06-19: qty 1
  Filled 2016-06-19: qty 2
  Filled 2016-06-19: qty 1

## 2016-06-19 MED ORDER — BUPIVACAINE HCL (PF) 0.25 % IJ SOLN
INTRAMUSCULAR | Status: AC
  Filled 2016-06-19: qty 30

## 2016-06-19 MED ORDER — DULOXETINE HCL 60 MG PO CPEP
60.0000 mg | ORAL_CAPSULE | Freq: Every day | ORAL | Status: DC
  Administered 2016-06-19: 60 mg via ORAL
  Filled 2016-06-19: qty 1

## 2016-06-19 MED ORDER — METFORMIN HCL 500 MG PO TABS
1000.0000 mg | ORAL_TABLET | Freq: Two times a day (BID) | ORAL | Status: DC
  Administered 2016-06-20: 1000 mg via ORAL
  Filled 2016-06-19: qty 2

## 2016-06-19 MED ORDER — GABAPENTIN 300 MG PO CAPS
600.0000 mg | ORAL_CAPSULE | Freq: Every day | ORAL | Status: DC
Start: 2016-06-19 — End: 2016-06-20
  Administered 2016-06-19: 600 mg via ORAL
  Filled 2016-06-19: qty 2

## 2016-06-19 MED ORDER — ACETAMINOPHEN 10 MG/ML IV SOLN
INTRAVENOUS | Status: DC | PRN
  Administered 2016-06-19: 1000 mg via INTRAVENOUS

## 2016-06-19 MED ORDER — WHITE PETROLATUM GEL
Status: AC
  Filled 2016-06-19: qty 5

## 2016-06-19 MED ORDER — AMLODIPINE BESYLATE 5 MG PO TABS
5.0000 mg | ORAL_TABLET | Freq: Every evening | ORAL | Status: DC
  Administered 2016-06-19: 5 mg via ORAL
  Filled 2016-06-19: qty 1

## 2016-06-19 SURGICAL SUPPLY — 51 items
BANDAGE ELASTIC 4 LF NS (GAUZE/BANDAGES/DRESSINGS) ×4 IMPLANT
BIT DRILL 2.5X110 QC LCP DISP (BIT) ×2 IMPLANT
BIT DRILL CANN 2.7X625 NONSTRL (BIT) ×2 IMPLANT
BLADE SURG 15 STRL LF DISP TIS (BLADE) ×1 IMPLANT
BLADE SURG 15 STRL SS (BLADE) ×1
BNDG ESMARK 6X12 TAN STRL LF (GAUZE/BANDAGES/DRESSINGS) ×2 IMPLANT
CUFF TOURN 24 STER (MISCELLANEOUS) IMPLANT
CUFF TOURN 30 STER DUAL PORT (MISCELLANEOUS) ×2 IMPLANT
DRAPE FLUOR MINI C-ARM 54X84 (DRAPES) ×2 IMPLANT
DRAPE INCISE IOBAN 66X45 STRL (DRAPES) ×2 IMPLANT
DRAPE U-SHAPE 47X51 STRL (DRAPES) ×2 IMPLANT
DURAPREP 26ML APPLICATOR (WOUND CARE) ×4 IMPLANT
GAUZE PETRO XEROFOAM 1X8 (MISCELLANEOUS) ×2 IMPLANT
GAUZE SPONGE 4X4 12PLY STRL (GAUZE/BANDAGES/DRESSINGS) ×2 IMPLANT
GLOVE BIOGEL PI IND STRL 9 (GLOVE) ×1 IMPLANT
GLOVE BIOGEL PI INDICATOR 9 (GLOVE) ×1
GLOVE SURG 9.0 ORTHO LTXF (GLOVE) ×4 IMPLANT
GOWN STRL REUS TWL 2XL XL LVL4 (GOWN DISPOSABLE) ×2 IMPLANT
GOWN STRL REUS W/ TWL LRG LVL3 (GOWN DISPOSABLE) ×1 IMPLANT
GOWN STRL REUS W/TWL LRG LVL3 (GOWN DISPOSABLE) ×1
K-WIRE NON THREAD 1.1 (WIRE) ×4
KIT RM TURNOVER STRD PROC AR (KITS) ×2 IMPLANT
KWIRE NON THREAD 1.1 (WIRE) ×2 IMPLANT
LABEL OR SOLS (LABEL) ×2 IMPLANT
NS IRRIG 1000ML POUR BTL (IV SOLUTION) ×2 IMPLANT
PACK EXTREMITY ARMC (MISCELLANEOUS) ×2 IMPLANT
PAD ABD DERMACEA PRESS 5X9 (GAUZE/BANDAGES/DRESSINGS) ×4 IMPLANT
PAD CAST CTTN 4X4 STRL (SOFTGOODS) ×3 IMPLANT
PADDING CAST COTTON 4X4 STRL (SOFTGOODS) ×3
PLATE LCP 3.5 1/3 TUB 8HX93 (Plate) ×2 IMPLANT
SCREW CANC FT 4.0X20 (Screw) IMPLANT
SCREW CANC FT ST SFS 4X14 (Screw) ×2 IMPLANT
SCREW CANC FT ST SFS 4X16 (Screw) ×4 IMPLANT
SCREW CANN L THRD/40 4.0 (Screw) ×2 IMPLANT
SCREW CORTEX 3.5 12MM (Screw) ×4 IMPLANT
SCREW CORTEX 3.5 16MM (Screw) IMPLANT
SCREW CORTEX 3.5 18MM (Screw) ×1 IMPLANT
SCREW CORTEX 3.5 20MM (Screw) IMPLANT
SCREW LOCK CORT ST 3.5X12 (Screw) ×4 IMPLANT
SCREW LOCK CORT ST 3.5X16 (Screw) IMPLANT
SCREW LOCK CORT ST 3.5X18 (Screw) ×1 IMPLANT
SCREW LOCK CORT ST 3.5X20 (Screw) IMPLANT
SPLINT CAST 1 STEP 4X30 (MISCELLANEOUS) ×4 IMPLANT
SPONGE LAP 18X18 5 PK (GAUZE/BANDAGES/DRESSINGS) ×4 IMPLANT
STAPLER SKIN PROX 35W (STAPLE) ×2 IMPLANT
STOCKINETTE STRL 6IN 960660 (GAUZE/BANDAGES/DRESSINGS) ×2 IMPLANT
STRIP CLOSURE SKIN 1/2X4 (GAUZE/BANDAGES/DRESSINGS) ×4 IMPLANT
SUT VIC AB 2-0 SH 27 (SUTURE) ×2
SUT VIC AB 2-0 SH 27XBRD (SUTURE) ×2 IMPLANT
SYR 30ML LL (SYRINGE) ×2 IMPLANT
TAPE MICROPORE 2IN (TAPE) ×2 IMPLANT

## 2016-06-19 NOTE — Transfer of Care (Signed)
Immediate Anesthesia Transfer of Care Note  Patient: Alexandria Marquez  Procedure(s) Performed: Procedure(s): OPEN REDUCTION INTERNAL FIXATION (ORIF) ANKLE FRACTURE (Left)  Patient Location: PACU  Anesthesia Type:General  Level of Consciousness: sedated  Airway & Oxygen Therapy: Patient Spontanous Breathing and Patient connected to face mask oxygen  Post-op Assessment: Report given to RN and Post -op Vital signs reviewed and stable  Post vital signs: Reviewed and stable  Last Vitals:  Vitals:   06/19/16 1019  BP: (!) 152/64  Pulse: 65  Resp: 16  Temp: 36.2 C    Last Pain:  Vitals:   06/19/16 1019  TempSrc: Tympanic  PainSc:          Complications: No apparent anesthesia complications

## 2016-06-19 NOTE — Progress Notes (Signed)
ADMISSION NOTE:  Pt admitted to room 156. Pt  Alert and oriented X4. Skin assessment completed w/ Shawna Orleans, RN. Pt oriented to room and staff. Call bell within reach bed in the lowest position and bed alarm on.

## 2016-06-19 NOTE — H&P (Signed)
PREOPERATIVE H&P  Chief Complaint: LEFT ANKLE FRACTURE  HPI: Alexandria Marquez is a 71 y.o. female who presents for preoperative history and physical with a diagnosis of LEFT ANKLE FRACTURE. Symptoms are rated as moderate to severe, and have been worsening.  This is significantly impairing activities of daily living.  She has elected for surgical management.   Past Medical History:  Diagnosis Date  . Anxiety   . Arthritis   . Asthma    when young  . Coronary artery disease   . Depression   . Diabetes mellitus without complication (HCC)   . Environmental and seasonal allergies   . GERD (gastroesophageal reflux disease)   . History of hiatal hernia   . Hypertension   . Hypothyroidism   . Lower extremity edema   . Motion sickness   . Scoliosis   . Shortness of breath dyspnea    doe   Past Surgical History:  Procedure Laterality Date  . ABDOMINAL HYSTERECTOMY    . APPENDECTOMY    . BACK SURGERY  2014   lumbar fusion/plate/rods/screws  . CHOLECYSTECTOMY  2005  . CORONARY ARTERY BYPASS GRAFT     2008 2 vessels  . CORONARY ARTERY BYPASS GRAFT  2008  . HERNIA REPAIR  1987   incisional  . JOINT REPLACEMENT Right 2017   knee  . rcr    . ROTATOR CUFF REPAIR Left 2013  . TONSILLECTOMY    . TOTAL KNEE ARTHROPLASTY Right 12/19/2015   Procedure: TOTAL KNEE ARTHROPLASTY;  Surgeon: Christena Flake, MD;  Location: [facility] ORS;  Service: Orthopedics;  Laterality: Right;  . TUBAL LIGATION     Social History   Social History  . Marital status: Married    Spouse name: N/A  . Number of children: N/A  . Years of education: N/A   Social History Main Topics  . Smoking status: Never Smoker  . Smokeless tobacco: Never Used  . Alcohol use No  . Drug use: No  . Sexual activity: No   Other Topics Concern  . None   Social History Narrative  . None   Family History  Problem Relation Age of Onset  . Breast cancer Maternal Aunt 30   Allergies  Allergen Reactions  . Morphine And Related  Nausea And Vomiting    Severe n & v. Any other pain medications are okay  . Penicillins Hives    Has patient had a PCN reaction causing immediate rash, facial/tongue/throat swelling, SOB or lightheadedness with hypotension: No Has patient had a PCN reaction causing severe rash involving mucus membranes or skin necrosis: No Has patient had a PCN reaction that required hospitalization No Has patient had a PCN reaction occurring within the last 10 years: No If all of the above answers are "NO", then may proceed with Cephalosporin use.    Prior to Admission medications   Medication Sig Start Date End Date Taking? Authorizing Provider  amLODipine (NORVASC) 5 MG tablet Take 5 mg by mouth every evening.   Yes Historical Provider, MD  aspirin EC 81 MG tablet Take 81 mg by mouth every evening.    Yes Historical Provider, MD  DULoxetine (CYMBALTA) 60 MG capsule Take 60 mg by mouth at bedtime.    Yes Historical Provider, MD  enalapril (VASOTEC) 20 MG tablet Take 20 mg by mouth 2 (two) times daily.   Yes Historical Provider, MD  fluticasone (FLONASE) 50 MCG/ACT nasal spray Place 1-2 sprays into both nostrils daily as needed for allergies.  Yes Historical Provider, MD  gabapentin (NEURONTIN) 300 MG capsule Take 600 mg by mouth at bedtime.   Yes Historical Provider, MD  glimepiride (AMARYL) 4 MG tablet Take 4 mg by mouth 2 (two) times daily.   Yes Historical Provider, MD  levothyroxine (SYNTHROID, LEVOTHROID) 75 MCG tablet Take 75 mcg by mouth daily before breakfast.   Yes Historical Provider, MD  meloxicam (MOBIC) 7.5 MG tablet Take 7.5 mg by mouth 2 (two) times daily.   Yes Historical Provider, MD  metFORMIN (GLUCOPHAGE) 1000 MG tablet Take 1,000 mg by mouth 2 (two) times daily with a meal.   Yes Historical Provider, MD  ondansetron (ZOFRAN) 4 MG tablet Take 1 tablet (4 mg total) by mouth every 8 (eight) hours as needed for nausea or vomiting. 06/02/16  Yes Nita Sickle, MD  oxyCODONE-acetaminophen  (ROXICET) 5-325 MG tablet Take 1 tablet by mouth every 6 (six) hours as needed. Patient taking differently: Take 1 tablet by mouth every 6 (six) hours as needed (for pain.).  06/04/16 06/04/17 Yes Enedina Finner, MD  pantoprazole (PROTONIX) 40 MG tablet Take 40 mg by mouth 2 (two) times daily.   Yes Historical Provider, MD  pioglitazone (ACTOS) 30 MG tablet Take 30 mg by mouth daily.   Yes Historical Provider, MD  Polyethyl Glycol-Propyl Glycol (SYSTANE) 0.4-0.3 % SOLN Place 1 drop into both eyes 3 (three) times daily as needed (for dry eyes).   Yes Historical Provider, MD  simvastatin (ZOCOR) 40 MG tablet Take 40 mg by mouth every evening.    Yes Historical Provider, MD  ciprofloxacin (CIPRO) 500 MG tablet Take 1 tablet (500 mg total) by mouth 2 (two) times daily. Patient not taking: Reported on 06/16/2016 06/04/16   Enedina Finner, MD  metroNIDAZOLE (FLAGYL) 500 MG tablet Take 1 tablet (500 mg total) by mouth 3 (three) times daily. Patient not taking: Reported on 06/16/2016 06/04/16   Enedina Finner, MD  traMADol (ULTRAM) 50 MG tablet Take 1-2 tablets (50-100 mg total) by mouth every 4 (four) hours as needed for moderate pain. Patient not taking: Reported on 06/02/2016 12/22/15   Tera Partridge, PA     Positive ROS: All other systems have been reviewed and were otherwise negative with the exception of those mentioned in the HPI and as above.  Physical Exam: General: Alert, no acute distress Cardiovascular: Regular rate and rhythm, no murmurs rubs or gallops.  No pedal edema Respiratory: Clear to auscultation bilaterally, no wheezes rales or rhonchi. No cyanosis, no use of accessory musculature GI: No organomegaly, abdomen is soft and non-tender nondistended with positive bowel sounds. Skin: Skin intact, no lesions within the operative field. Neurologic: Sensation intact distally Psychiatric: Patient is competent for consent with normal mood and affect Lymphatic: No  cervical lymphadenopathy  MUSCULOSKELETAL:  Left:  Patient in an AO splint. It is clean and dry. Patient has intact sensation in all 5 toes. She can flex and extend her toes. Her toes well perfused.  Assessment: LEFT ANKLE FRACTURE  Plan: Plan for Procedure(s): OPEN REDUCTION INTERNAL FIXATION (ORIF) ANKLE FRACTURE  I discussed the details of the operation postoperative course.  I discussed the risks and benefits of surgery. The risks include but are not limited to infection, bleeding requiring blood transfusion, nerve or blood vessel injury, joint stiffness or loss of motion, persistent pain, weakness or instability, malunion, nonunion and hardware failure and the need for further surgery. Medical risks include but are not limited to DVT and pulmonary embolism, myocardial infarction, stroke, pneumonia,  respiratory failure and death. Patient understood these risks and wished to proceed.   Juanell Fairly, MD   06/19/2016 12:09 PM

## 2016-06-19 NOTE — Anesthesia Procedure Notes (Signed)
Procedure Name: Intubation Date/Time: 06/19/2016 12:28 PM Performed by: Almeta Monas Pre-anesthesia Checklist: Patient identified, Emergency Drugs available, Suction available and Patient being monitored Patient Re-evaluated:Patient Re-evaluated prior to inductionOxygen Delivery Method: Circle system utilized Preoxygenation: Pre-oxygenation with 100% oxygen Intubation Type: IV induction Ventilation: Mask ventilation without difficulty Laryngoscope Size: 3 and Miller Grade View: Grade II Tube type: Oral Tube size: 7.0 mm Number of attempts: 1 Airway Equipment and Method: Stylet Placement Confirmation: ETT inserted through vocal cords under direct vision,  positive ETCO2 and breath sounds checked- equal and bilateral Secured at: 21 cm Tube secured with: Tape Dental Injury: Teeth and Oropharynx as per pre-operative assessment  Difficulty Due To: Difficulty was anticipated and Difficult Airway- due to anterior larynx Future Recommendations: Recommend- induction with short-acting agent, and alternative techniques readily available

## 2016-06-19 NOTE — Anesthesia Preprocedure Evaluation (Signed)
Anesthesia Evaluation  Patient identified by MRN, date of birth, ID band Patient awake    Reviewed: Allergy & Precautions, H&P , NPO status , Patient's Chart, lab work & pertinent test results  History of Anesthesia Complications Negative for: history of anesthetic complications  Airway Mallampati: III  TM Distance: <3 FB Neck ROM: limited    Dental  (+) Poor Dentition, Chipped, Caps   Pulmonary neg shortness of breath, asthma ,    Pulmonary exam normal breath sounds clear to auscultation       Cardiovascular Exercise Tolerance: Good hypertension, (-) angina+ CAD, + Cardiac Stents and + CABG  (-) Past MI and (-) DOE Normal cardiovascular exam Rhythm:regular Rate:Normal     Neuro/Psych PSYCHIATRIC DISORDERS Anxiety Depression negative neurological ROS     GI/Hepatic Neg liver ROS, hiatal hernia, GERD  Controlled and Medicated,  Endo/Other  diabetes, Type 2Hypothyroidism   Renal/GU      Musculoskeletal  (+) Arthritis ,   Abdominal   Peds  Hematology negative hematology ROS (+)   Anesthesia Other Findings Past Medical History: No date: Anxiety No date: Arthritis No date: Asthma     Comment: when young No date: Coronary artery disease No date: Depression No date: Diabetes mellitus without complication (HCC) No date: Environmental and seasonal allergies No date: GERD (gastroesophageal reflux disease) No date: History of hiatal hernia No date: Hypertension No date: Hypothyroidism No date: Lower extremity edema No date: Motion sickness No date: Scoliosis No date: Shortness of breath dyspnea     Comment: doe  Past Surgical History: No date: ABDOMINAL HYSTERECTOMY No date: APPENDECTOMY 2014: BACK SURGERY     Comment: lumbar fusion/plate/rods/screws 2005: CHOLECYSTECTOMY No date: CORONARY ARTERY BYPASS GRAFT     Comment: 2008 2 vessels 2008: CORONARY ARTERY BYPASS GRAFT 1987: HERNIA REPAIR     Comment:  incisional 2017: JOINT REPLACEMENT Right     Comment: knee No date: rcr 2013: ROTATOR CUFF REPAIR Left No date: TONSILLECTOMY 12/19/2015: TOTAL KNEE ARTHROPLASTY Right     Comment: Procedure: TOTAL KNEE ARTHROPLASTY;  Surgeon:               Christena Flake, MD;  Location: [facility] ORS;                Service: Orthopedics;  Laterality: Right; No date: TUBAL LIGATION  BMI    Body Mass Index:  33.23 kg/m      Reproductive/Obstetrics negative OB ROS                             Anesthesia Physical Anesthesia Plan  ASA: III  Anesthesia Plan: General ETT   Post-op Pain Management:    Induction:   Airway Management Planned:   Additional Equipment:   Intra-op Plan:   Post-operative Plan:   Informed Consent: I have reviewed the patients History and Physical, chart, labs and discussed the procedure including the risks, benefits and alternatives for the proposed anesthesia with the patient or authorized representative who has indicated his/her understanding and acceptance.   Dental Advisory Given  Plan Discussed with: Anesthesiologist, CRNA and Surgeon  Anesthesia Plan Comments:         Anesthesia Quick Evaluation

## 2016-06-19 NOTE — Progress Notes (Signed)
Per MD Martha Clan , ok to give 2nd dose of oxycodone 5 mg now.

## 2016-06-19 NOTE — Op Note (Signed)
06/19/2016  2:20 PM  PATIENT:  Alexandria Marquez    PRE-OPERATIVE DIAGNOSIS:  LEFT BIMALLEOLAR ANKLE FRACTURE  POST-OPERATIVE DIAGNOSIS:  Same  PROCEDURE:  OPEN REDUCTION INTERNAL FIXATION (ORIF) BIMALLEOLAR LEFT ANKLE FRACTURE  SURGEON:  Thornton Park, MD  ANESTHESIA:   General  PREOPERATIVE INDICATIONS:  Alexandria Marquez is a  71 y.o. female with a diagnosis of LEFT BIMALLEOLAR ANKLE FRACTURE who is here for surgical management of her fracture given the displacement of her injury    I discussed the risks and benefits of surgery. The risks include but are not limited to infection, bleeding requiring blood transfusion, nerve or blood vessel injury, joint stiffness or loss of motion, persistent pain, weakness or instability, malunion, nonunion and hardware failure and the need for further surgery. Medical risks include but are not limited to DVT and pulmonary embolism, myocardial infarction, stroke, pneumonia, respiratory failure and death. Patient understood these risks and wished to proceed.   OPERATIVE IMPLANTS: Synthes 8 hole 1/3 tubular plate with 3.5 bicortical screws and 4.0 cancellous screws for lateral fixation and a single 4.0 mm cannulated screw for medial fixation  OPERATIVE FINDINGS: displaced bimalleolar fracture  OPERATIVE PROCEDURE:   Patient was met in the preoperative area. The left leg was signed my initials and the word yes according the hospital's correct site of surgery protocol. The patient was brought to the operating room where she underwent general anesthesia. The patient was placed supine on the operative table. A bump was placed under the left hip. A tourniquet was applied to the left thigh.  The lower extremity was prepped and draped in a sterile fashion. A timeout was performed to verify the patient's name, date of birth, medical record number, correct site of surgery and correct procedure to be performed. It was also used to verify the patient received antibiotics, and  that all appropriate instruments, implants and radiographic studies were available in the room. Once all in attendance were in agreement, the case began.  The left lower extremity was exsanguinated with an Esmarch. The tourniquet was inflated to 300 mmHg. This was applied for a total of 79 minutes. A lateral incision was made over the fibula. The subcutaneous tissues were dissected with the Metzenbaum scissor and pickup. Care was taken to avoid injury to the superficial peroneal nerve. The lateral malleolus fracture was identified and irrigated and fracture hematoma was removed. Soft tissue was removed from the fracture site using a periosteal elevator. A fracture reduction clamp was then used to reduce the fracture to an anatomic position.     The lateral malleolus was then drilled in an AP direction, perpendicular to the fracture site to allow for placement of the lag screw.   A single lag screw, 18 mm in length, was advanced across the fracture site by hand. This compressed the fracture.   A 8 hole, 1/3 tubular plate was then contoured and placed along the lateral fibula. Bicortical screws were placed proximal to the fracture and fully threaded cancellus screws were placed distal the fracture. The fracture reduction and hardware placement were confirmed on AP and lateral imaging.  Once the lateral malleolus was plated, the attention was turned to the medial ankle. A small vertical incision was made over the tip of the medial malleolus.  Soft tissue was dissected with some with the Metzenbaum scissor and pickup. The fracture of the medial malleolus was identified.  A single threaded K wire for the 4.0 cannulated screw was then advanced through the tip  of the medial malleolus across the fracture site and into the distal tibia. The position of the K wires was evaluated on AP and lateral FluoroScan images. The length of the wire was measured with a depth gauge and was determined to be 40 mm in length. The wire  was then overdrilled with a cannulated drill for the 4.0 cannulated screw. The 39m long threaded 4.0 cannulated screw was then advanced into position by hand, compressing the medial malleolus fracture.    The posterior malleolus was then examined under fluoroscopy.  The decision was made made not to place an AP screw given its stability and small size.  A stress test of the right ankle was then performed under fluoroscopy.  This test did not reveal any syndesmotic injury or opening of the medial clear space.  The medial and lateral incisions were then copiously irrigated. The subcutaneous tissue was closed with 2-0 Vicryl and the skin approximated staples. A dry sterile dressing was applied along with an AO splint. The patient's ankle was positioned in neutral. The pateint was then awoken from anesthesia, transferred to hospital bed and brought to the PACU in stable condition. I was scrubbed and present the entire case and all sharp and instrument counts were correct at conclusion the case. I spoke to the patient's family in the post-op consultation room to let them know the case was performed without complication and the patient was stable in recovery room.    KTimoteo Gaul MD

## 2016-06-19 NOTE — Anesthesia Post-op Follow-up Note (Cosign Needed)
Anesthesia QCDR form completed.        

## 2016-06-19 NOTE — Progress Notes (Signed)
  Subjective:  POST-OP CHECK:  Patient reports lateral ankle pain as marked.   Patient lying in bed with left leg elevated.  Her husband is at the bedside.   Objective:   VITALS:   Vitals:   06/19/16 1502 06/19/16 1517 06/19/16 1537 06/19/16 1544  BP: (!) 107/54 (!) 110/48 (!) 123/55   Pulse: 90 80 86   Resp: 15 (!) 8 18   Temp:  97 F (36.1 C) (!) 96.4 F (35.8 C)   TempSrc:   Axillary   SpO2: 96% 95% 96%   Weight:    78.5 kg (173 lb)  Height:    5' (1.524 m)    PHYSICAL EXAM:  Left lower extremity:  Splint and dressing C/D/I.  Toes are well perfused. Neurovascular intact Sensation intact distally including the first dorsal webspace.   LABS  Results for orders placed or performed during the hospital encounter of 06/19/16 (from the past 24 hour(s))  Glucose, capillary     Status: Abnormal   Collection Time: 06/19/16 10:21 AM  Result Value Ref Range   Glucose-Capillary 140 (H) 65 - 99 mg/dL  Protime-INR     Status: None   Collection Time: 06/19/16 10:37 AM  Result Value Ref Range   Prothrombin Time 14.2 11.4 - 15.2 seconds   INR 1.10   APTT     Status: None   Collection Time: 06/19/16 11:33 AM  Result Value Ref Range   aPTT 26 24 - 36 seconds  Glucose, capillary     Status: Abnormal   Collection Time: 06/19/16  2:46 PM  Result Value Ref Range   Glucose-Capillary 128 (H) 65 - 99 mg/dL  Glucose, capillary     Status: Abnormal   Collection Time: 06/19/16  4:36 PM  Result Value Ref Range   Glucose-Capillary 174 (H) 65 - 99 mg/dL    Dg Ankle Complete Left  Result Date: 06/19/2016 CLINICAL DATA:  Postop left ankle fracture EXAM: LEFT ANKLE COMPLETE - 3+ VIEW COMPARISON:  Preoperative study from 06/02/2016 FINDINGS: Plate and screw fixation of the distal fibular diaphysis fracture. Single cannulated screw traverses the medial malleolar fracture. The ankle mortise is grossly intact without abnormal widening. Posterior malleolar fracture with minimal dorsal displacement  is stable. Subtalar and midfoot articulations are maintained. IMPRESSION: Status post ORIF of medial malleolar and fibular diaphyseal fractures as seen through fiberglass. Alignment is near anatomic. Tiny posterior malleolar fracture with minimal dorsal displacement stable. Electronically Signed   By: Tollie Eth M.D.   On: 06/19/2016 15:08    Assessment/Plan: Day of Surgery   Active Problems:   Ankle fracture, bimalleolar, closed, left, initial encounter  Patient is stable post-op.  Vital signs are stable.  Continue strict elevation of the left lower extremity.  Patient is NWB on the left lower extremity.  Continue 24 hours of post-op antibiotics.  Encourage IS while awake.  ECASA for DVT prophylaxis.  Check labs in AM.  PT/OT to begin tomorrow.  I have reviewed the post-op xrays which show fractures are well reduced and hardware well positioned.      Juanell Fairly , MD 06/19/2016, 5:08 PM

## 2016-06-20 ENCOUNTER — Encounter: Payer: Self-pay | Admitting: Orthopedic Surgery

## 2016-06-20 DIAGNOSIS — S82842A Displaced bimalleolar fracture of left lower leg, initial encounter for closed fracture: Secondary | ICD-10-CM | POA: Diagnosis not present

## 2016-06-20 LAB — BASIC METABOLIC PANEL
Anion gap: 6 (ref 5–15)
BUN: 15 mg/dL (ref 6–20)
CO2: 26 mmol/L (ref 22–32)
Calcium: 8.9 mg/dL (ref 8.9–10.3)
Chloride: 103 mmol/L (ref 101–111)
Creatinine, Ser: 0.74 mg/dL (ref 0.44–1.00)
GFR calc Af Amer: >60 mL/min (ref >60)
GFR calc non Af Amer: >60 mL/min (ref >60)
Glucose, Bld: 105 mg/dL — ABNORMAL HIGH (ref 65–99)
Potassium: 4.1 mmol/L (ref 3.5–5.1)
Sodium: 135 mmol/L (ref 135–145)

## 2016-06-20 LAB — GLUCOSE, CAPILLARY: Glucose-Capillary: 91 mg/dL (ref 65–99)

## 2016-06-20 LAB — CBC
HCT: 35.9 % (ref 35.0–47.0)
Hemoglobin: 12.3 g/dL (ref 12.0–16.0)
MCH: 31.1 pg (ref 26.0–34.0)
MCHC: 34.3 g/dL (ref 32.0–36.0)
MCV: 90.8 fL (ref 80.0–100.0)
Platelets: 346 10*3/uL (ref 150–440)
RBC: 3.96 MIL/uL (ref 3.80–5.20)
RDW: 14.5 % (ref 11.5–14.5)
WBC: 10.8 10*3/uL (ref 3.6–11.0)

## 2016-06-20 MED ORDER — OXYCODONE HCL 5 MG PO TABS: 5.0000 mg | ORAL_TABLET | ORAL | 0 refills | Status: DC | PRN

## 2016-06-20 MED ORDER — ASPIRIN 325 MG PO TBEC: 325.0000 mg | DELAYED_RELEASE_TABLET | Freq: Two times a day (BID) | ORAL | 0 refills | Status: DC

## 2016-06-20 NOTE — Care Management Note (Signed)
Case Management Note  Patient Details  Name: Alexandria Marquez MRN: [id number] Date of Birth: 1945/11/04  Subjective/Objective:    70yo Alexandria Marquez has an ankle fracture which impairs her ability to perform her daily activities of daily living in the home. A walker, cane or crutches will not resolve the issure of her performing her activities of daily living. A wheelchair will allow Alexandria Marquez to perform her daily activities. Alexandria Marquez can safely propel the wheelchair or has a caregiver who can provide assistance.                 Action/Plan:   Expected Discharge Date:  06/20/16               Expected Discharge Plan:     In-House Referral:     Discharge planning Services     Post Acute Care Choice:    Choice offered to:     DME Arranged:    DME Agency:     HH Arranged:    HH Agency:     Status of Service:     If discussed at [employer], dates discussed:    Additional Comments:  Norvell Ureste A, RN 06/20/2016, 12:15 PM

## 2016-06-20 NOTE — Care Management Note (Signed)
Case Management Note  Patient Details  Name: ALESHKA CORNEY MRN: [id number] Date of Birth: July 15, 1945  Subjective/Objective:     A referral for home health PT and RN and a request for a wheelchair with adjustable legs and a youth walker were called to Shaune Leeks at [facility]. The wheelchair and youth walker will be delivered to [facility] hospital room 156 today.       Expected Discharge Date:  06/20/16               Expected Discharge Plan:     In-House Referral:     Discharge planning Services     Post Acute Care Choice:    Choice offered to:     DME Arranged:    DME Agency:     HH Arranged:    HH Agency:     Status of Service:     If discussed at [employer], dates discussed:    Additional Comments:  Yukie Bergeron A, RN 06/20/2016, 12:23 PM

## 2016-06-20 NOTE — Progress Notes (Signed)
Subjective:  Postoperative day 1 status post left ankle ORIF. Patient reports pain as mild to moderate.  Patient up out of bed to a chair. He is much improved compared to last night. Husband is at the bedside.  Objective:   VITALS:   Vitals:   06/19/16 1925 06/19/16 2019 06/20/16 0000 06/20/16 0741  BP: (!) 148/65 (!) 150/55 (!) 161/71 (!) 145/59  Pulse: 87 86  64  Resp: Temp: 97.6 F (36.4 C)  97.9 F (36.6 C) 97.9 F (36.6 C)  TempSrc: Oral  Oral Oral  SpO2: 97% 99% 100% 98%  Weight:      Height:        PHYSICAL EXAM:  Left lower extremity: Splint and dressing remained clean and dry. Left lower extremity is elevated. Her toes are well-perfused. She has intact sensation light touch. She can flex and extend all toes.   LABS  Results for orders placed or performed during the hospital encounter of 06/19/16 (from the past 24 hour(s))  Glucose, capillary     Status: Abnormal   Collection Time: 06/19/16  2:46 PM  Result Value Ref Range   Glucose-Capillary 128 (H) 65 - 99 mg/dL  Glucose, capillary     Status: Abnormal   Collection Time: 06/19/16  4:36 PM  Result Value Ref Range   Glucose-Capillary 174 (H) 65 - 99 mg/dL  CBC     Status: None   Collection Time: 06/20/16  3:29 AM  Result Value Ref Range   WBC 10.8 3.6 - 11.0 K/uL   RBC 3.96 3.80 - 5.20 MIL/uL   Hemoglobin 12.3 12.0 - 16.0 g/dL   HCT 16.1 09.6 - 04.5 %   MCV 90.8 80.0 - 100.0 fL   MCH 31.1 26.0 - 34.0 pg   MCHC 34.3 32.0 - 36.0 g/dL   RDW 40.9 81.1 - 91.4 %   Platelets 346 150 - 440 K/uL  Basic metabolic panel     Status: Abnormal   Collection Time: 06/20/16  3:29 AM  Result Value Ref Range   Sodium 135 135 - 145 mmol/L   Potassium 4.1 3.5 - 5.1 mmol/L   Chloride 103 101 - 111 mmol/L   CO2 26 22 - 32 mmol/L   Glucose, Bld 105 (H) 65 - 99 mg/dL   BUN 15 6 - 20 mg/dL   Creatinine, Ser 7.82 0.44 - 1.00 mg/dL   Calcium 8.9 8.9 - 95.6 mg/dL   GFR calc non Af Amer >60 >60 mL/min   GFR  calc Af Amer >60 >60 mL/min   Anion gap 6 5 - 15  Glucose, capillary     Status: None   Collection Time: 06/20/16  7:38 AM  Result Value Ref Range   Glucose-Capillary 91 65 - 99 mg/dL   Comment 1 Notify RN    Comment 2 Document in Chart     Dg Ankle Complete Left  Result Date: 06/19/2016 CLINICAL DATA:  Postop left ankle fracture EXAM: LEFT ANKLE COMPLETE - 3+ VIEW COMPARISON:  Preoperative study from 06/02/2016 FINDINGS: Plate and screw fixation of the distal fibular diaphysis fracture. Single cannulated screw traverses the medial malleolar fracture. The ankle mortise is grossly intact without abnormal widening. Posterior malleolar fracture with minimal dorsal displacement is stable. Subtalar and midfoot articulations are maintained. IMPRESSION: Status post ORIF of medial malleolar and fibular diaphyseal fractures as seen through fiberglass. Alignment is near anatomic. Tiny posterior malleolar fracture with minimal dorsal displacement stable. Electronically Signed  By: Tollie Eth M.D.   On: 06/19/2016 15:08    Assessment/Plan: 1 Day Post-Op   Active Problems:   Ankle fracture, bimalleolar, closed, left, initial encounter  Patient doing well postop. Her pain is controlled. Patient is ready to be discharged home. She will remain nonweightbearing on the left lower extremity for 6 weeks postop. She will require a rolling walker for short distances and a wheelchair with a left leg support for longer distances. Patient will be ordered for home health PT. She'll be on enteric-coated aspirin 325 mg twice a day for DVT prophylaxis.  She will follow-up in my office in 10-14 days.    Juanell Fairly , MD 06/20/2016, 11:49 AM

## 2016-06-20 NOTE — Clinical Social Work Note (Signed)
CSW was following pending any dc needs for facility based care. As the patient is going to dc home, the CSW is signing off. Please feel free to consult should any needs arise.  Argentina Ponder, MSW, [facility] [phone]

## 2016-06-20 NOTE — Progress Notes (Signed)
Patient being discharged to home with PT.  IV's removed; belongings packed. Husband here to take patient home. VSS at time of discharge.

## 2016-06-20 NOTE — Clinical Social Work Note (Signed)
CSW received consult for possible SNF for STR. CSW will follow pending PT recommendations.  Argentina Ponder, MSW, [facility] [phone]

## 2016-06-20 NOTE — Evaluation (Signed)
Physical Therapy Evaluation Patient Details Name: Alexandria Marquez MRN: [id number] DOB: 01/18/1946 Today's Date: 06/20/2016   History of Present Illness  Pt is a 71 y.o. female with a known history of DM, HTN, CAD, GERD, anxiety, depression, hx R TKR, with recent visit to ER on 3/13 for nausea, vomiting and diarrhea and fall off toilet resulting in L ankle fracture. Pt elected to have surgical repair of L ankle, completed 3/30  Clinical Impression  Pt has been doing stand pivot transfers and minimal scooting for the last 2-3 weeks since fx.  She is now post-op and was able to do some hopping and mobility all while maintaining NWBing on the L.  She is not confident doing a whole lot of mobility, but ultimately did well and should be able to return home with the appropriate walker and wheelchair w/o issue. Husband able to help at home and they are eager to get home later today.     Follow Up Recommendations Home health PT    Equipment Recommendations  Rolling walker with 5" wheels;Wheelchair (measurements PT) (youth height walker, w/c with adjustable leg rests)    Recommendations for Other Services       Precautions / Restrictions Precautions Precautions: Fall Restrictions Weight Bearing Restrictions: Yes LLE Weight Bearing: Non weight bearing      Mobility  Bed Mobility Overal bed mobility: Modified Independent Bed Mobility: Supine to Sit     Supine to sit: Supervision Sit to supine: Supervision   General bed mobility comments: Pt did well getting to EOB w/o direct assist, she did need heavy UE use, but with time got to sitting safely  Transfers Overall transfer level: Modified independent Equipment used: Standard walker Transfers: Sit to/from Stand Sit to Stand: Min guard         General transfer comment: Pt did relatively well getting to standing and maintaining balance with the walker.  She showed good safety and ability to maintain NWBing on the L.     Ambulation/Gait Ambulation/Gait assistance: Min guard Ambulation Distance (Feet): 6 Feet Assistive device: Rolling walker (2 wheeled)       General Gait Details: With considerable encouragement and cuing pt was able to take a few small hopping steps along EOB but she felt much more confident with just heel-toe shifting but did keep weight off while hopping  Stairs            Wheelchair Mobility    Modified Rankin (Stroke Patients Only)       Balance Overall balance assessment: Needs assistance Sitting-balance support: No upper extremity supported Sitting balance-Leahy Scale: Normal     Standing balance support: Bilateral upper extremity supported Standing balance-Leahy Scale: Fair                               Pertinent Vitals/Pain Pain Assessment: 0-10 Pain Score: 3  Pain Location: L ankle Pain Descriptors / Indicators: Stabbing;Sharp Pain Intervention(s): Limited activity within patient's tolerance;Monitored during session;Repositioned;Ice applied    Home Living Family/patient expects to be discharged to:: Private residence Living Arrangements: Spouse/significant other Available Help at Discharge: Family;Available 24 hours/day Type of Home: House Home Access: Ramped entrance     Home Layout: One level Home Equipment: Walker - 4 wheels;Walker - 2 wheels;Bedside commode;Wheelchair - manual (has standard heright walker (too tall) and w/c w/o leg rests) Additional Comments: pt reports the manual w/c she borrowed does not have leg rests which makes it  difficult to utilize as needed    Prior Function Level of Independence: Needs assistance   Gait / Transfers Assistance Needed: since ankle fx, pt performing stand pivot transfers from wc to chair/bed/toilet  ADL's / Homemaking Assistance Needed: since ankle fx, pt required Min assist for LB dressing from spouse; unable to perform household tasks due to limited mobility  Comments: Prior to ankle fx,  pt was Ind with ADL, IADL, amb without AD in the home and with rollator in the community; active in her church     Hand Dominance   Dominant Hand: Right    Extremity/Trunk Assessment   Upper Extremity Assessment Upper Extremity Assessment: Defer to OT evaluation    Lower Extremity Assessment Lower Extremity Assessment: Generalized weakness;Overall WFL for tasks assessed (h/o R TKA with some weakness, generally functional t/o)    Cervical / Trunk Assessment Cervical / Trunk Assessment: Normal  Communication   Communication: No difficulties  Cognition Arousal/Alertness: Awake/alert Behavior During Therapy: WFL for tasks assessed/performed Overall Cognitive Status: Within Functional Limits for tasks assessed                                        General Comments      Exercises Other Exercises Other Exercises: Pt/spouse educated in AD/AE including tub transfer bench, suction showerhead mount and environmental set up to improve safety for bathing tasks with pt/spouse verbalizing understanding; handout provided for equipment discussed Other Exercises: Pt/spouse educated in energy conservation strategies to support falls prevention and functional independence with self care tasks with pt/spouse verbalizing understanding; handout provided Other Exercises: OT facilitated problem solving with pt to improve safety and adherence with medication mgt and pt verbalized plan for utilizing weekly pill boxes for am meds and another for pm meds placed strategically in the home to minimize forgetfullness and improve adherence   Assessment/Plan    PT Assessment Patient needs continued PT services  PT Problem List Decreased strength;Decreased activity tolerance;Decreased balance;Decreased mobility;Decreased knowledge of use of DME       PT Treatment Interventions Gait training;DME instruction;Stair training;Functional mobility training;Neuromuscular re-education;Balance  training;Therapeutic exercise;Therapeutic activities;Patient/family education    PT Goals (Current goals can be found in the Care Plan section)  Acute Rehab PT Goals Patient Stated Goal: go home PT Goal Formulation: With patient Time For Goal Achievement: 07/04/16 Potential to Achieve Goals: Good    Frequency 7X/week   Barriers to discharge        Co-evaluation               End of Session Equipment Utilized During Treatment: Gait belt Activity Tolerance: Patient tolerated treatment well Patient left: in chair;with chair alarm set;with call bell/phone within reach Nurse Communication: Mobility status PT Visit Diagnosis: Difficulty in walking, not elsewhere classified (R26.2);Muscle weakness (generalized) (M62.81)    Time: 1610-9604 PT Time Calculation (min) (ACUTE ONLY): 31 min   Charges:   PT Evaluation $PT Eval Low Complexity: 1 Procedure     PT G Codes:   PT G-Codes **NOT FOR INPATIENT CLASS** Functional Assessment Tool Used: AM-PAC 6 Clicks Basic Mobility Functional Limitation: Mobility: Walking and moving around Mobility: Walking and Moving Around Current Status (V4098): At least 40 percent but less than 60 percent impaired, limited or restricted Mobility: Walking and Moving Around Goal Status [phone]): At least 1 percent but less than 20 percent impaired, limited or restricted     Knute Mazzuca R  Stefan Church, DPT 06/20/2016, 12:59 PM

## 2016-06-20 NOTE — Evaluation (Addendum)
Occupational Therapy Evaluation Patient Details Name: Alexandria Marquez MRN: [id number] DOB: 1945-09-03 Today's Date: 06/20/2016    History of Present Illness Pt is a 71 y.o. female with a known history of DM, HTN, CAD, GERD, anxiety, depression, hx R TKR, with recent visit to ER on 3/13 for nausea, vomiting and diarrhea and fall off toilet resulting in L ankle fracture. Pt elected to have surgical repair of L ankle, completed 3/30. POD#1 for OT evaluation following surgical repair.   Clinical Impression   Pt seen for OT evaluation this date, POD#1 from L ankle fx surgical repair. Prior to surgery, pt was limited by pain, functional mobility, and independence with self care tasks due to L ankle fx on 06/02/16. Prior to ankle fx, pt was independent with all ADL, IADL, including driving and was very active with her church. She is eager to return to PLOF. Pt presents with minimal pain but increasing with movement, NWB'ing to LLE precautions, decreased strength/ROM/balance/activity tolerance/knowledge of AE, increased risk of falls, and increased need for assist with self care tasks. Pt and spouse educated in use of AE and compensatory strategies as well as home/routines modifications to support safety, falls prevention, and functional independence, with handouts provided and pt/spouse verbalizing understanding of all information provided. When asked about falls, pt reported only the recent fall and ankle fx but stated "I have terrible balance; I have a lot of medications that make me dizzy." When asked about medication mgt, pt states, "My biggest problem is forgetting to take them after supper because I'm tired and don't want to get back up and go get them once I'm in bed." OT facilitated problem solving to identify strategies to improve adherence to medications and pt verbalized plan for using a weekly pill box in the kitchen/living room for her morning meds and a separate weekly pill box beside her bed with her  evening meds. Pt will benefit from home health skilled OT services to address noted impairments and functional deficits including home/routines modifications, energy conservation, AE/compensatory strategies for self care tasks, medication management education/training in order to maximize return to PLOF and minimize risk of future falls, injury, and rehospitalization. Additionally, pt may benefit from follow up with primary care physician to review medications for side effects including falls risk and adherence/management. No additional acute care needs, OT will sign off. Please re-consult if there is a change in pt status or additional acute care OT needs are identified.     Follow Up Recommendations  Home health OT    Equipment Recommendations  Tub/shower bench;Other (comment) (grab bars in bathroom, suction showerhead mount)    Recommendations for Other Services       Precautions / Restrictions Precautions Precautions: Fall Restrictions Weight Bearing Restrictions: Yes LLE Weight Bearing: Non weight bearing      Mobility Bed Mobility Overal bed mobility: Needs Assistance Bed Mobility: Supine to Sit;Sit to Supine     Supine to sit: Supervision Sit to supine: Supervision   General bed mobility comments: increased time/effort but no physical assist required for LLE  Transfers Overall transfer level: Needs assistance Equipment used: Standard walker Transfers: Sit to/from Stand Sit to Stand: Min guard         General transfer comment: min verbal cues for hand placement during t/f and to maintain NWB'ing status    Balance Overall balance assessment: Needs assistance Sitting-balance support: No upper extremity supported Sitting balance-Leahy Scale: Normal     Standing balance support: Bilateral upper extremity supported Standing  balance-Leahy Scale: Fair                             ADL either performed or assessed with clinical judgement   ADL Overall  ADL's : Needs assistance/impaired Eating/Feeding: Sitting;Set up   Grooming: Sitting;Oral care;Wash/dry face;Set up;Brushing hair Grooming Details (indicate cue type and reason): Pt performed grooming tasks seated EOB with LLE supported with pillow on floor Upper Body Bathing: Sitting;Set up;Supervision/ safety   Lower Body Bathing: Minimal assistance;Sitting/lateral leans;Sit to/from stand;Cueing for safety   Upper Body Dressing : Set up;Sitting   Lower Body Dressing: Minimal assistance;Sit to/from stand;Sitting/lateral leans;Cueing for safety Lower Body Dressing Details (indicate cue type and reason): cueing for NWBing LLE; pt educated in use of AE to support functional independence with LB dressing tasks and pt verbalized understanding               General ADL Comments: Generally min assist for LB ADL, functional mobility deferred to PT evaluation due to insufficient equipment/safety     Vision Baseline Vision/History: Wears glasses Wears Glasses: At all times Patient Visual Report: No change from baseline Vision Assessment?: No apparent visual deficits     Perception     Praxis Praxis Praxis tested?: Within functional limits    Pertinent Vitals/Pain Pain Assessment: 0-10 Pain Score: 1  Pain Location: L ankle Pain Descriptors / Indicators: Stabbing;Sharp Pain Intervention(s): Limited activity within patient's tolerance;Monitored during session;Repositioned;Ice applied     Hand Dominance Right   Extremity/Trunk Assessment Upper Extremity Assessment Upper Extremity Assessment: Generalized weakness (grossly 3+/5 bilaterally, ROM WFL; hx L rotator cuff repair, hx R shoulder injury with no significant functional limitations)   Lower Extremity Assessment Lower Extremity Assessment: Defer to PT evaluation;LLE deficits/detail   Cervical / Trunk Assessment Cervical / Trunk Assessment: Normal   Communication Communication Communication: No difficulties   Cognition  Arousal/Alertness: Awake/alert Behavior During Therapy: WFL for tasks assessed/performed Overall Cognitive Status: Within Functional Limits for tasks assessed                                     General Comments       Exercises Other Exercises Other Exercises: Pt/spouse educated in AD/AE including tub transfer bench, suction showerhead mount and environmental set up to improve safety for bathing tasks with pt/spouse verbalizing understanding; handout provided for equipment discussed Other Exercises: Pt/spouse educated in energy conservation strategies to support falls prevention and functional independence with self care tasks with pt/spouse verbalizing understanding; handout provided Other Exercises: OT facilitated problem solving with pt to improve safety and adherence with medication mgt and pt verbalized plan for utilizing weekly pill boxes for am meds and another for pm meds placed strategically in the home to minimize forgetfullness and improve adherence   Shoulder Instructions      Home Living Family/patient expects to be discharged to:: Private residence Living Arrangements: Spouse/significant other Available Help at Discharge: Family;Available 24 hours/day Type of Home: House Home Access: Ramped entrance     Home Layout: One level     Bathroom Shower/Tub: Tub/shower unit;Door   Bathroom Toilet: Standard (using BSC over toilet recently) Bathroom Accessibility: No   Home Equipment: Cane - single point;Walker - 2 wheels;Other (comment);Grab bars - tub/shower;Hand held shower head;Adaptive equipment;Wheelchair - [employer] (rollator, suction grab bars) Adaptive Equipment: Reacher;Sock aid Additional Comments: pt reports the manual w/c she borrowed  does not have leg rests which makes it difficult to utilize as needed      Prior Functioning/Environment Level of Independence: Needs assistance  Gait / Transfers Assistance Needed: since ankle fx, pt  performing stand pivot transfers from wc to chair/bed/toilet ADL's / Homemaking Assistance Needed: since ankle fx, pt required Min assist for LB dressing from spouse; unable to perform household tasks due to limited mobility   Comments: Prior to ankle fx, pt was Ind with ADL, IADL, amb without AD in the home and with rollator in the community; active in her church        OT Problem List: Decreased safety awareness;Decreased activity tolerance;Decreased knowledge of use of DME or AE;Decreased knowledge of precautions;Decreased strength;Decreased coordination;Pain;Decreased range of motion;Impaired balance (sitting and/or standing)      OT Treatment/Interventions:      OT Goals(Current goals can be found in the care plan section) Acute Rehab OT Goals Patient Stated Goal: go home OT Goal Formulation: With patient/family Time For Goal Achievement: 06/22/16 Potential to Achieve Goals: Good ADL Goals Additional ADL Goal #1: Pt will verbalize plan for implementing at least 1 learned ECS to support independence and safety at home.  OT Frequency:     Barriers to D/C:            Co-evaluation              End of Session Equipment Utilized During Treatment: Gait belt;Rolling walker  Activity Tolerance: Patient tolerated treatment well Patient left: in bed;with call bell/phone within reach;with family/visitor present;Other (comment) (LLE repositioning w/ ice)  OT Visit Diagnosis: Other abnormalities of gait and mobility (R26.89);Muscle weakness (generalized) (M62.81);History of falling (Z91.81);Pain Pain - Right/Left: Left Pain - part of body: Ankle and joints of foot                Time: 7564-3329 OT Time Calculation (min): 73 min Charges:  OT General Charges $OT Visit: 1 Procedure OT Evaluation $OT Eval Low Complexity: 1 Procedure OT Treatments $Self Care/Home Management : 53-67 mins G-Codes: OT G-codes **NOT FOR INPATIENT CLASS** Functional Assessment Tool Used: AM-PAC 6  Clicks Daily Activity;Clinical judgement Functional Limitation: Self care Self Care Current Status (J1884): At least 20 percent but less than 40 percent impaired, limited or restricted Self Care Goal Status (Z6606): At least 20 percent but less than 40 percent impaired, limited or restricted Self Care Discharge Status [phone]): At least 20 percent but less than 40 percent impaired, limited or restricted   Richrd Prime, MPH, MS, OTR/L ascom [phone] 06/20/16, 11:11 AM

## 2016-06-20 NOTE — Discharge Summary (Signed)
Physician Discharge Summary  Patient ID: Alexandria Marquez MRN: [id number] DOB/AGE: 1945/06/06 71 y.o.  Admit date: 06/19/2016 Discharge date: 06/20/2016  Admission Diagnoses:  LEFT ANKLE FRACTURE <principal problem not specified>  Discharge Diagnoses:  LEFT ANKLE FRACTURE Active Problems:   Ankle fracture, bimalleolar, closed, left, initial encounter   Past Medical History:  Diagnosis Date  . Anxiety   . Arthritis   . Asthma    when young  . Coronary artery disease   . Depression   . Diabetes mellitus without complication (HCC)   . Environmental and seasonal allergies   . GERD (gastroesophageal reflux disease)   . History of hiatal hernia   . Hypertension   . Hypothyroidism   . Lower extremity edema   . Motion sickness   . Scoliosis   . Shortness of breath dyspnea    doe    Surgeries: Procedure(s): OPEN REDUCTION INTERNAL FIXATION (ORIF) ANKLE FRACTURE on 06/19/2016   Consultants (if any):   Discharged Condition: Improved  Hospital Course: Alexandria Marquez is an 71 y.o. female who was admitted 06/19/2016 with a diagnosis of  LEFT ANKLE FRACTURE <principal problem not specified> and went to the operating room on 06/19/2016 and ORIF of left ankle.   Patient was admitted to observation for postoperative pain control. On postop day #1 her pain was much improved and she was ready for discharge home. Patient remained stable postoperatively.  Her postoperative labs also remained within normal limits.  She was given perioperative antibiotics:  Anti-infectives    Start     Dose/Rate Route Frequency Ordered Stop   06/19/16 1830  clindamycin (CLEOCIN) IVPB 600 mg     600 mg 100 mL/hr over 30 Minutes Intravenous Every 6 hours 06/19/16 1538 06/20/16 0039   06/19/16 0853  clindamycin (CLEOCIN) 600 MG/50ML IVPB    Comments:  Buck Mam: cabinet override      06/19/16 0853 06/19/16 1230   06/18/16 2200  clindamycin (CLEOCIN) IVPB 600 mg     600 mg 100 mL/hr over 30 Minutes  Intravenous  Once 06/18/16 2158 06/19/16 1230    .  She was given sequential compression devices, early ambulation, and ECASA for DVT prophylaxis.  She benefited maximally from the hospital stay and there were no complications.   Given the patient's clinical improvement on postop day 1 she is prepared for discharge home.  Recent vital signs:  Vitals:   06/20/16 0000 06/20/16 0741  BP: (!) 161/71 (!) 145/59  Pulse:  64  Resp: 20 18  Temp: 97.9 F (36.6 C) 97.9 F (36.6 C)    Recent laboratory studies:  Lab Results  Component Value Date   HGB 12.3 06/20/2016   HGB 12.6 06/03/2016   HGB 16.1 (H) 06/02/2016   Lab Results  Component Value Date   WBC 10.8 06/20/2016   PLT 346 06/20/2016   Lab Results  Component Value Date   INR 1.10 06/19/2016   Lab Results  Component Value Date   NA 135 06/20/2016   K 4.1 06/20/2016   CL 103 06/20/2016   CO2 26 06/20/2016   BUN 15 06/20/2016   CREATININE 0.74 06/20/2016   GLUCOSE 105 (H) 06/20/2016    Discharge Medications:   Allergies as of 06/20/2016      Reactions   Morphine And Related Nausea And Vomiting   Severe n & v. Any other pain medications are okay   Penicillins Hives   Has patient had a PCN reaction causing immediate rash, facial/tongue/throat  swelling, SOB or lightheadedness with hypotension: No Has patient had a PCN reaction causing severe rash involving mucus membranes or skin necrosis: No Has patient had a PCN reaction that required hospitalization No Has patient had a PCN reaction occurring within the last 10 years: No If all of the above answers are "NO", then may proceed with Cephalosporin use.      Medication List    STOP taking these medications   ciprofloxacin 500 MG tablet Commonly known as:  CIPRO   meloxicam 7.5 MG tablet Commonly known as:  MOBIC   metroNIDAZOLE 500 MG tablet Commonly known as:  FLAGYL   oxyCODONE-acetaminophen 5-325 MG tablet Commonly known as:  ROXICET   traMADol 50 MG  tablet Commonly known as:  ULTRAM     TAKE these medications   amLODipine 5 MG tablet Commonly known as:  NORVASC Take 5 mg by mouth every evening.   aspirin 325 MG EC tablet Take 1 tablet (325 mg total) by mouth 2 (two) times daily. What changed:  medication strength  how much to take  when to take this   DULoxetine 60 MG capsule Commonly known as:  CYMBALTA Take 60 mg by mouth at bedtime.   enalapril 20 MG tablet Commonly known as:  VASOTEC Take 20 mg by mouth 2 (two) times daily.   fluticasone 50 MCG/ACT nasal spray Commonly known as:  FLONASE Place 1-2 sprays into both nostrils daily as needed for allergies.   gabapentin 300 MG capsule Commonly known as:  NEURONTIN Take 600 mg by mouth at bedtime.   glimepiride 4 MG tablet Commonly known as:  AMARYL Take 4 mg by mouth 2 (two) times daily.   levothyroxine 75 MCG tablet Commonly known as:  SYNTHROID, LEVOTHROID Take 75 mcg by mouth daily before breakfast.   metFORMIN 1000 MG tablet Commonly known as:  GLUCOPHAGE Take 1,000 mg by mouth 2 (two) times daily with a meal.   ondansetron 4 MG tablet Commonly known as:  ZOFRAN Take 1 tablet (4 mg total) by mouth every 8 (eight) hours as needed for nausea or vomiting.   oxyCODONE 5 MG immediate release tablet Commonly known as:  Oxy IR/ROXICODONE Take 1-2 tablets (5-10 mg total) by mouth every 4 (four) hours as needed for breakthrough pain ((for MODERATE breakthrough pain)).   pantoprazole 40 MG tablet Commonly known as:  PROTONIX Take 40 mg by mouth 2 (two) times daily.   pioglitazone 30 MG tablet Commonly known as:  ACTOS Take 30 mg by mouth daily.   simvastatin 40 MG tablet Commonly known as:  ZOCOR Take 40 mg by mouth every evening.   SYSTANE 0.4-0.3 % Soln Generic drug:  Polyethyl Glycol-Propyl Glycol Place 1 drop into both eyes 3 (three) times daily as needed (for dry eyes).            Durable Medical Equipment        Start     Ordered    06/20/16 1200  For home use only DME standard manual wheelchair with seat cushion  Once    Comments:  Patient suffers from left ankle fracture which impairs their ability to perform daily activities like bathing, dressing, feeding, grooming and toileting in the home.  A cane, crutch or walker will not resolve  issue with performing activities of daily living. A wheelchair will allow patient to safely perform daily activities. Patient can safely propel the wheelchair in the home or has a caregiver who can provide assistance.  Accessories: elevating leg  rests (ELRs), wheel locks, extensions and anti-tippers.   06/20/16 1200      Diagnostic Studies: Dg Ankle Complete Left  Result Date: 06/19/2016 CLINICAL DATA:  Postop left ankle fracture EXAM: LEFT ANKLE COMPLETE - 3+ VIEW COMPARISON:  Preoperative study from 06/02/2016 FINDINGS: Plate and screw fixation of the distal fibular diaphysis fracture. Single cannulated screw traverses the medial malleolar fracture. The ankle mortise is grossly intact without abnormal widening. Posterior malleolar fracture with minimal dorsal displacement is stable. Subtalar and midfoot articulations are maintained. IMPRESSION: Status post ORIF of medial malleolar and fibular diaphyseal fractures as seen through fiberglass. Alignment is near anatomic. Tiny posterior malleolar fracture with minimal dorsal displacement stable. Electronically Signed   By: Tollie Eth M.D.   On: 06/19/2016 15:08   Dg Ankle Complete Left  Result Date: 06/02/2016 CLINICAL DATA:  Fall. EXAM: LEFT ANKLE COMPLETE - 3+ VIEW COMPARISON:  No recent. FINDINGS: Slight displaced fracture of the medial malleolus noted. Displaced angulated fracture of the distal fibula. Slight displaced fracture of the posterior malleolus. Disruption of the tibial talar joint . IMPRESSION: Fractures of the medial and posterior malleolus. Angulated displaced fracture of the distal fibula. Disruption of the tibial talar joint.  Electronically Signed   By: Maisie Fus  Register   On: 06/02/2016 16:43   Ct Head Wo Contrast  Result Date: 06/02/2016 CLINICAL DATA:  Nausea, vomiting, and diarrhea since noon today, fell for from toilet run on reason, denies loss of consciousness, chest discomfort, history CABG, back pain post fall, diabetes mellitus, hypertension EXAM: CT HEAD WITHOUT CONTRAST TECHNIQUE: Contiguous axial images were obtained from the base of the skull through the vertex without intravenous contrast. Sagittal and coronal MPR images reconstructed from axial data set. COMPARISON:  None FINDINGS: Brain: Generalized atrophy of cerebral hemispheres and cerebellum. Normal ventricular morphology. No midline shift or mass effect. No intracranial hemorrhage, mass lesion or evidence acute infarction. No extra-axial fluid collections. Vascular: Mild atherosclerotic calcification at the carotid siphons Skull: Intact though mildly demineralized Sinuses/Orbits: Clear Other: N/A IMPRESSION: Generalized atrophy. No acute intracranial abnormalities. Electronically Signed   By: Ulyses Southward M.D.   On: 06/02/2016 16:29   Ct Abdomen Pelvis W Contrast  Result Date: 06/02/2016 CLINICAL DATA:  71 year old female with abdominal pain nausea vomiting diarrhea and leukocytosis. Status post fall today. EXAM: CT ABDOMEN AND PELVIS WITH CONTRAST TECHNIQUE: Multidetector CT imaging of the abdomen and pelvis was performed using the standard protocol following bolus administration of intravenous contrast. CONTRAST:  75mL ISOVUE-300 IOPAMIDOL (ISOVUE-300) INJECTION 61% COMPARISON:  Lumbar MRI 10/01/2014. CT Abdomen and Pelvis 08/01/2012 and earlier. FINDINGS: Lower chest: Lower lung volumes today compared to 2014. Calcified granulomas in the left lower lobe. Mild peribronchial and dependent atelectasis. No pleural effusion. No suspicious lung base opacity. Cardiomegaly. No pericardial effusion. Hepatobiliary: Chronic surgically absent gallbladder. Negative  liver aside from a small volume of free fluid adjacent to the dome (series 3, image 8). Intra and extrahepatic biliary ducts appear stable since 2014. Pancreas: Fatty atrophied, stable since 2014. Spleen: Negative.  No adjacent free fluid. Adrenals/Urinary Tract: Normal adrenal glands. Bilateral renal enhancement and contrast excretion is within normal limits. Kidneys appear stable since 2014. No hydroureter. Decompressed and unremarkable urinary bladder. Multiple pelvic phleboliths. Stomach/Bowel: Decompressed and negative rectum. Redundant sigmoid colon with moderate diverticulosis of the proximal sigmoid continuing into the distal descending colon. No definite active inflammation. There is a trace amount of free fluid adjacent to the distal sigmoid. The left colon is decompressed with mild diverticulosis  also at the splenic flexure. No active inflammation. The transverse colon and right colon are decompressed. There is a small volume of free fluid about the cecum. Appendix appears to be diminutive or absent. Negative terminal ileum. No dilated small bowel. Questionable widespread small bowel mucosal hyper enhancement, and there does appear to be mild mesenteric stranding associated with left lateral abdominal small bowel loops (coronal images 30 through 37) where there is also a small volume of free fluid. Mild fluid distension of the stomach and distal esophagus with small sliding-type hiatal hernia suspected. Suggestion of mild wall where there is thickening in the duodenum mild adjacent inflammatory stranding (series 3, image 33). No pneumoperitoneum. There is rectus muscle diastases in the midline of the lower abdomen but no bona fide or incarcerated hernia. No other mesenteric stranding identified. Vascular/Lymphatic: Aortoiliac calcified atherosclerosis noted. Major arterial structures are patent. Portal venous system is patent. New line no lymphadenopathy. Reproductive: Surgically absent as in 2014. Other:  Small volume of simple density free fluid in the pelvis (series 3, image 64). Musculoskeletal: Osteopenia. Previous lower lumbar spine fusion changes. The hardware appears stable since 2014, as does a chronic postoperative fluid collection at the laminectomy space (probably a seroma). Partially visible previous median sternotomy. No lower rib fracture identified. Pelvis appears stable and intact. Chronic pubic symphysis degeneration. No acute osseous abnormality identified. IMPRESSION: 1. Suspicion of generalized small bowel inflammation, most apparent in the proximal duodenum and loops of jejunum in the left lateral abdomen. Favor infectious enteritis in this clinical setting. No dilated bowel loops. No definite colonic inflammation, although there is a small volume of free fluid adjacent to the cecum. Superimposed intermittent large bowel diverticulosis 2. Small volume of free fluid in the abdomen and pelvis is nonspecific but favored due to #1. No free air. 3. No other inflammatory process identified. 4. Calcified aortic atherosclerosis. Mild cardiomegaly and lung base atelectasis. Chronic postoperative changes to the lumbar spine appears stable since 2014. Electronically Signed   By: Odessa Fleming M.D.   On: 06/02/2016 17:36    Disposition: 06-Home-Health Care Svc  Discharge Instructions    Call MD / Call 911    Complete by:  As directed    If you experience chest pain or shortness of breath, CALL 911 and be transported to the hospital emergency room.  If you develope a fever above 101 F, pus (white drainage) or increased drainage or redness at the wound, or calf pain, call your surgeon's office.   Constipation Prevention    Complete by:  As directed    Drink plenty of fluids.  Prune juice may be helpful.  You may use a stool softener, such as Colace (over the counter) 100 mg twice a day.  Use MiraLax (over the counter) for constipation as needed.   Diet general    Complete by:  As directed    Discharge  instructions    Complete by:  As directed    Patient must remain nonweightbearing on the left lower extremity for a total of 6 weeks postop. Continue strict elevation of the left lower extremity at home. Elevate the left lower extremity on pillows at home. Patient may apply ice packs to the left lower extremity as needed. Keep splint and dressing on and clean dry and intact until follow-up in 10-14 days.  Take enteric-coated aspirin 325 mg by mouth twice a day for DVT prophylaxis for 6 weeks.   Driving restrictions    Complete by:  As directed  No driving for 4-6 weeks   Increase activity slowly as tolerated    Complete by:  As directed    Lifting restrictions    Complete by:  As directed    No lifting for 10-12 weeks         Signed: Juanell Fairly ,MD 06/20/2016, 12:01 PM

## 2016-06-22 ENCOUNTER — Encounter: Payer: Self-pay | Admitting: Orthopedic Surgery

## 2016-06-25 NOTE — Anesthesia Postprocedure Evaluation (Signed)
Anesthesia Post Note  Patient: Alexandria Marquez  Procedure(s) Performed: Procedure(s) (LRB): OPEN REDUCTION INTERNAL FIXATION (ORIF) ANKLE FRACTURE (Left)  Patient location during evaluation: PACU Anesthesia Type: General Level of consciousness: awake and alert Pain management: pain level controlled Vital Signs Assessment: post-procedure vital signs reviewed and stable Respiratory status: spontaneous breathing, nonlabored ventilation, respiratory function stable and patient connected to nasal cannula oxygen Cardiovascular status: blood pressure returned to baseline and stable Postop Assessment: no signs of nausea or vomiting Anesthetic complications: no     Last Vitals:  Vitals:   06/20/16 0000 06/20/16 0741  BP: (!) 161/71 (!) 145/59  Pulse:  64  Resp: 20 18  Temp: 36.6 C 36.6 C    Last Pain:  Vitals:   06/20/16 0741  TempSrc: Oral  PainSc: 1                  Yevette Edwards

## 2016-10-01 ENCOUNTER — Emergency Department: Payer: Medicare Other

## 2016-10-01 ENCOUNTER — Encounter: Payer: Self-pay | Admitting: *Deleted

## 2016-10-01 ENCOUNTER — Inpatient Hospital Stay
Admission: EM | Admit: 2016-10-01 | Discharge: 2016-10-05 | DRG: 392 | Disposition: A | Discharge status: Home / Self Care | Payer: Medicare Other | Attending: Internal Medicine | Admitting: Internal Medicine

## 2016-10-01 DIAGNOSIS — Z7951 Long term (current) use of inhaled steroids: Secondary | ICD-10-CM | POA: Diagnosis not present

## 2016-10-01 DIAGNOSIS — I129 Hypertensive chronic kidney disease with stage 1 through stage 4 chronic kidney disease, or unspecified chronic kidney disease: Secondary | ICD-10-CM | POA: Diagnosis present

## 2016-10-01 DIAGNOSIS — F418 Other specified anxiety disorders: Secondary | ICD-10-CM | POA: Diagnosis present

## 2016-10-01 DIAGNOSIS — E1122 Type 2 diabetes mellitus with diabetic chronic kidney disease: Secondary | ICD-10-CM | POA: Diagnosis present

## 2016-10-01 DIAGNOSIS — Z981 Arthrodesis status: Secondary | ICD-10-CM

## 2016-10-01 DIAGNOSIS — K5730 Diverticulosis of large intestine without perforation or abscess without bleeding: Secondary | ICD-10-CM | POA: Diagnosis present

## 2016-10-01 DIAGNOSIS — K529 Noninfective gastroenteritis and colitis, unspecified: Principal | ICD-10-CM | POA: Diagnosis present

## 2016-10-01 DIAGNOSIS — Z90710 Acquired absence of both cervix and uterus: Secondary | ICD-10-CM | POA: Diagnosis not present

## 2016-10-01 DIAGNOSIS — K219 Gastro-esophageal reflux disease without esophagitis: Secondary | ICD-10-CM | POA: Diagnosis present

## 2016-10-01 DIAGNOSIS — E876 Hypokalemia: Secondary | ICD-10-CM | POA: Diagnosis present

## 2016-10-01 DIAGNOSIS — Z951 Presence of aortocoronary bypass graft: Secondary | ICD-10-CM

## 2016-10-01 DIAGNOSIS — E119 Type 2 diabetes mellitus without complications: Secondary | ICD-10-CM | POA: Diagnosis present

## 2016-10-01 DIAGNOSIS — I10 Essential (primary) hypertension: Secondary | ICD-10-CM | POA: Diagnosis present

## 2016-10-01 DIAGNOSIS — I2510 Atherosclerotic heart disease of native coronary artery without angina pectoris: Secondary | ICD-10-CM | POA: Diagnosis present

## 2016-10-01 DIAGNOSIS — N183 Chronic kidney disease, stage 3 (moderate): Secondary | ICD-10-CM | POA: Diagnosis present

## 2016-10-01 DIAGNOSIS — Z96651 Presence of right artificial knee joint: Secondary | ICD-10-CM | POA: Diagnosis present

## 2016-10-01 DIAGNOSIS — Z79899 Other long term (current) drug therapy: Secondary | ICD-10-CM

## 2016-10-01 DIAGNOSIS — Z7982 Long term (current) use of aspirin: Secondary | ICD-10-CM | POA: Diagnosis not present

## 2016-10-01 DIAGNOSIS — Z885 Allergy status to narcotic agent status: Secondary | ICD-10-CM

## 2016-10-01 DIAGNOSIS — E785 Hyperlipidemia, unspecified: Secondary | ICD-10-CM | POA: Diagnosis present

## 2016-10-01 DIAGNOSIS — Z880 Allergy status to penicillin: Secondary | ICD-10-CM | POA: Diagnosis not present

## 2016-10-01 DIAGNOSIS — E860 Dehydration: Secondary | ICD-10-CM | POA: Diagnosis present

## 2016-10-01 DIAGNOSIS — E039 Hypothyroidism, unspecified: Secondary | ICD-10-CM | POA: Diagnosis present

## 2016-10-01 DIAGNOSIS — K625 Hemorrhage of anus and rectum: Secondary | ICD-10-CM | POA: Diagnosis present

## 2016-10-01 DIAGNOSIS — I248 Other forms of acute ischemic heart disease: Secondary | ICD-10-CM | POA: Diagnosis present

## 2016-10-01 DIAGNOSIS — Z7984 Long term (current) use of oral hypoglycemic drugs: Secondary | ICD-10-CM | POA: Diagnosis not present

## 2016-10-01 LAB — COMPREHENSIVE METABOLIC PANEL
ALT: 21 U/L (ref 14–54)
AST: 29 U/L (ref 15–41)
Albumin: 3.9 g/dL (ref 3.5–5.0)
Alkaline Phosphatase: 83 U/L (ref 38–126)
Anion gap: 14 (ref 5–15)
BUN: 33 mg/dL — ABNORMAL HIGH (ref 6–20)
CO2: 22 mmol/L (ref 22–32)
Calcium: 9.6 mg/dL (ref 8.9–10.3)
Chloride: 102 mmol/L (ref 101–111)
Creatinine, Ser: 1.56 mg/dL — ABNORMAL HIGH (ref 0.44–1.00)
GFR calc Af Amer: 37 mL/min — ABNORMAL LOW (ref >60)
GFR calc non Af Amer: 32 mL/min — ABNORMAL LOW (ref >60)
Glucose, Bld: 345 mg/dL — ABNORMAL HIGH (ref 65–99)
Potassium: 4.9 mmol/L (ref 3.5–5.1)
Sodium: 138 mmol/L (ref 135–145)
Total Bilirubin: 1 mg/dL (ref 0.3–1.2)
Total Protein: 7.2 g/dL (ref 6.5–8.1)

## 2016-10-01 LAB — PROTIME-INR
INR: 1.18
Prothrombin Time: 15.1 seconds (ref 11.4–15.2)

## 2016-10-01 LAB — GLUCOSE, CAPILLARY: Glucose-Capillary: 251 mg/dL — ABNORMAL HIGH (ref 65–99)

## 2016-10-01 LAB — GASTROINTESTINAL PANEL BY PCR, STOOL (REPLACES STOOL CULTURE)

## 2016-10-01 LAB — TYPE AND SCREEN
ABO/RH(D): A NEG
Antibody Screen: NEGATIVE

## 2016-10-01 LAB — CBC WITH DIFFERENTIAL/PLATELET
Basophils Absolute: 0.1 10*3/uL (ref 0–0.1)
Basophils Relative: 0 %
Eosinophils Absolute: 0 10*3/uL (ref 0–0.7)
Eosinophils Relative: 0 %
HCT: 46.3 % (ref 35.0–47.0)
Hemoglobin: 15.3 g/dL (ref 12.0–16.0)
Lymphocytes Relative: 5 %
Lymphs Abs: 1 10*3/uL (ref 1.0–3.6)
MCH: 29.8 pg (ref 26.0–34.0)
MCHC: 33.2 g/dL (ref 32.0–36.0)
MCV: 89.9 fL (ref 80.0–100.0)
Monocytes Absolute: 0.7 10*3/uL (ref 0.2–0.9)
Monocytes Relative: 3 %
Neutro Abs: 19.1 10*3/uL — ABNORMAL HIGH (ref 1.4–6.5)
Neutrophils Relative %: 92 %
Platelets: 313 10*3/uL (ref 150–440)
RBC: 5.15 MIL/uL (ref 3.80–5.20)
RDW: 14.1 % (ref 11.5–14.5)
WBC: 20.9 10*3/uL — ABNORMAL HIGH (ref 3.6–11.0)

## 2016-10-01 LAB — C DIFFICILE QUICK SCREEN W PCR REFLEX
C Diff antigen: POSITIVE — AB
C Diff toxin: NEGATIVE

## 2016-10-01 LAB — TROPONIN I: Troponin I: 0.1 ng/mL (ref <0.03)

## 2016-10-01 LAB — LIPASE, BLOOD: Lipase: 23 U/L (ref 11–51)

## 2016-10-01 LAB — CLOSTRIDIUM DIFFICILE BY PCR: Toxigenic C. Difficile by PCR: NEGATIVE

## 2016-10-01 MED ORDER — CIPROFLOXACIN IN D5W 400 MG/200ML IV SOLN
400.0000 mg | Freq: Two times a day (BID) | INTRAVENOUS | Status: DC
  Administered 2016-10-02 – 2016-10-05 (×7): 400 mg via INTRAVENOUS
  Filled 2016-10-01 (×8): qty 200

## 2016-10-01 MED ORDER — ONDANSETRON HCL 4 MG/2ML IJ SOLN
4.0000 mg | Freq: Four times a day (QID) | INTRAMUSCULAR | Status: DC | PRN
  Administered 2016-10-02 – 2016-10-03 (×3): 4 mg via INTRAVENOUS
  Filled 2016-10-01 (×3): qty 2

## 2016-10-01 MED ORDER — SODIUM CHLORIDE 0.9 % IV SOLN
80.0000 mg | Freq: Once | INTRAVENOUS | Status: AC
  Administered 2016-10-01: 80 mg via INTRAVENOUS
  Filled 2016-10-01: qty 80

## 2016-10-01 MED ORDER — ACETAMINOPHEN 650 MG RE SUPP: 650.0000 mg | Freq: Four times a day (QID) | RECTAL | Status: DC | PRN

## 2016-10-01 MED ORDER — ATORVASTATIN CALCIUM 20 MG PO TABS
40.0000 mg | ORAL_TABLET | Freq: Every day | ORAL | Status: DC
  Administered 2016-10-01 – 2016-10-04 (×4): 40 mg via ORAL
  Filled 2016-10-01 (×4): qty 2

## 2016-10-01 MED ORDER — METRONIDAZOLE IN NACL 5-0.79 MG/ML-% IV SOLN
500.0000 mg | Freq: Three times a day (TID) | INTRAVENOUS | Status: DC
Start: 2016-10-01 — End: 2016-10-02
  Administered 2016-10-02: 500 mg via INTRAVENOUS
  Filled 2016-10-01 (×4): qty 100

## 2016-10-01 MED ORDER — AMLODIPINE BESYLATE 5 MG PO TABS
5.0000 mg | ORAL_TABLET | Freq: Every evening | ORAL | Status: DC
  Administered 2016-10-01 – 2016-10-02 (×2): 5 mg via ORAL
  Filled 2016-10-01: qty 1

## 2016-10-01 MED ORDER — INSULIN ASPART 100 UNIT/ML ~~LOC~~ SOLN
0.0000 [IU] | Freq: Four times a day (QID) | SUBCUTANEOUS | Status: DC
  Administered 2016-10-01: 5 [IU] via SUBCUTANEOUS
  Administered 2016-10-02: 2 [IU] via SUBCUTANEOUS
  Administered 2016-10-02: 3 [IU] via SUBCUTANEOUS
  Administered 2016-10-02 – 2016-10-03 (×2): 1 [IU] via SUBCUTANEOUS
  Administered 2016-10-03: 2 [IU] via SUBCUTANEOUS
  Administered 2016-10-04: 3 [IU] via SUBCUTANEOUS
  Filled 2016-10-01 (×7): qty 1

## 2016-10-01 MED ORDER — SODIUM CHLORIDE 0.9 % IV SOLN
INTRAVENOUS | Status: DC
  Administered 2016-10-01: 23:00:00 via INTRAVENOUS

## 2016-10-01 MED ORDER — ACETAMINOPHEN 325 MG PO TABS: 650.0000 mg | ORAL_TABLET | Freq: Four times a day (QID) | ORAL | Status: DC | PRN

## 2016-10-01 MED ORDER — METRONIDAZOLE IN NACL 5-0.79 MG/ML-% IV SOLN
500.0000 mg | Freq: Once | INTRAVENOUS | Status: AC
  Administered 2016-10-01: 500 mg via INTRAVENOUS
  Filled 2016-10-01: qty 100

## 2016-10-01 MED ORDER — PANTOPRAZOLE SODIUM 40 MG PO TBEC
40.0000 mg | DELAYED_RELEASE_TABLET | Freq: Two times a day (BID) | ORAL | Status: DC
  Administered 2016-10-01 – 2016-10-05 (×8): 40 mg via ORAL
  Filled 2016-10-01 (×8): qty 1

## 2016-10-01 MED ORDER — CIPROFLOXACIN IN D5W 400 MG/200ML IV SOLN
400.0000 mg | Freq: Once | INTRAVENOUS | Status: AC
  Administered 2016-10-01: 400 mg via INTRAVENOUS
  Filled 2016-10-01: qty 200

## 2016-10-01 MED ORDER — IOPAMIDOL (ISOVUE-300) INJECTION 61%
75.0000 mL | Freq: Once | INTRAVENOUS | Status: AC | PRN
  Administered 2016-10-01: 75 mL via INTRAVENOUS

## 2016-10-01 MED ORDER — ONDANSETRON HCL 4 MG PO TABS: 4.0000 mg | ORAL_TABLET | Freq: Four times a day (QID) | ORAL | Status: DC | PRN

## 2016-10-01 MED ORDER — GABAPENTIN 300 MG PO CAPS
600.0000 mg | ORAL_CAPSULE | Freq: Every day | ORAL | Status: DC
  Administered 2016-10-01 – 2016-10-04 (×4): 600 mg via ORAL
  Filled 2016-10-01 (×4): qty 2

## 2016-10-01 MED ORDER — ASPIRIN EC 81 MG PO TBEC
81.0000 mg | DELAYED_RELEASE_TABLET | Freq: Every day | ORAL | Status: DC
  Administered 2016-10-01: 81 mg via ORAL
  Filled 2016-10-01: qty 1

## 2016-10-01 MED ORDER — SODIUM CHLORIDE 0.9 % IV BOLUS (SEPSIS)
1000.0000 mL | Freq: Once | INTRAVENOUS | Status: AC
  Administered 2016-10-01: 1000 mL via INTRAVENOUS

## 2016-10-01 MED ORDER — LEVOTHYROXINE SODIUM 50 MCG PO TABS
75.0000 ug | ORAL_TABLET | Freq: Every day | ORAL | Status: DC
  Administered 2016-10-02 – 2016-10-05 (×4): 75 ug via ORAL
  Filled 2016-10-01 (×4): qty 2

## 2016-10-01 MED ORDER — DULOXETINE HCL 60 MG PO CPEP
60.0000 mg | ORAL_CAPSULE | Freq: Every day | ORAL | Status: DC
  Administered 2016-10-01 – 2016-10-04 (×4): 60 mg via ORAL
  Filled 2016-10-01 (×3): qty 2
  Filled 2016-10-01 (×3): qty 1
  Filled 2016-10-01: qty 2
  Filled 2016-10-01 (×2): qty 1

## 2016-10-01 MED ORDER — ONDANSETRON HCL 4 MG/2ML IJ SOLN
4.0000 mg | Freq: Once | INTRAMUSCULAR | Status: AC
  Administered 2016-10-01: 4 mg via INTRAVENOUS
  Filled 2016-10-01: qty 2

## 2016-10-01 MED ORDER — TRAMADOL HCL 50 MG PO TABS
50.0000 mg | ORAL_TABLET | Freq: Four times a day (QID) | ORAL | Status: DC | PRN
  Administered 2016-10-02 – 2016-10-03 (×3): 50 mg via ORAL
  Filled 2016-10-01 (×3): qty 1

## 2016-10-01 MED ORDER — IOPAMIDOL (ISOVUE-300) INJECTION 61%
30.0000 mL | Freq: Once | INTRAVENOUS | Status: AC | PRN
  Administered 2016-10-01: 30 mL via ORAL

## 2016-10-01 NOTE — H&P (Signed)
[facility] [place] at [facility]   PATIENT NAME: Alexandria Marquez    MR#:  [id number]  DATE OF BIRTH:  11/07/1945  DATE OF ADMISSION:  10/01/2016  PRIMARY CARE PHYSICIAN: Leotis ShamesSingh, Jasmine, MD   REQUESTING/REFERRING PHYSICIAN: Silverio LayYao, MD  CHIEF COMPLAINT:   Chief Complaint  Patient presents with  . Weakness    HISTORY OF PRESENT ILLNESS:  Alexandria AbrahamsJudy Eastham  is a 71 y.o. female who presents with Abdominal pain that started this morning when she woke up. She had diarrhea this morning, and stated that she did not pay much attention due to that time in terms of having blood in it due to what she had last night for dinner. However, subsequent stools were not stool mixed with reddish color, but rather straight blood with some clots according to her report. She has significant abdominal pain. She also had nausea and vomiting several times today. Here in the ED CT scan showed likely colitis. Hospitalists were called for admission.  PAST MEDICAL HISTORY:   Past Medical History:  Diagnosis Date  . Anxiety   . Arthritis   . Asthma    when young  . Coronary artery disease   . Depression   . Diabetes mellitus without complication (HCC)   . Environmental and seasonal allergies   . GERD (gastroesophageal reflux disease)   . History of hiatal hernia   . Hypertension   . Hypothyroidism   . Lower extremity edema   . Motion sickness   . Scoliosis   . Shortness of breath dyspnea    doe    PAST SURGICAL HISTORY:   Past Surgical History:  Procedure Laterality Date  . ABDOMINAL HYSTERECTOMY    . APPENDECTOMY    . BACK SURGERY  2014   lumbar fusion/plate/rods/screws  . CHOLECYSTECTOMY  2005  . CORONARY ARTERY BYPASS GRAFT     2008 2 vessels  . CORONARY ARTERY BYPASS GRAFT  2008  . HERNIA REPAIR  1987   incisional  . JOINT REPLACEMENT Right 2017   knee  . ORIF ANKLE FRACTURE Left 06/19/2016   Procedure: OPEN REDUCTION INTERNAL FIXATION (ORIF) ANKLE FRACTURE;  Surgeon:  Juanell FairlyKevin Krasinski, MD;  Location: [facility] ORS;  Service: Orthopedics;  Laterality: Left;  . rcr    . ROTATOR CUFF REPAIR Left 2013  . TONSILLECTOMY    . TOTAL KNEE ARTHROPLASTY Right 12/19/2015   Procedure: TOTAL KNEE ARTHROPLASTY;  Surgeon: Christena FlakeJohn J Poggi, MD;  Location: [facility] ORS;  Service: Orthopedics;  Laterality: Right;  . TUBAL LIGATION      SOCIAL HISTORY:   Social History  Substance Use Topics  . Smoking status: Never Smoker  . Smokeless tobacco: Never Used  . Alcohol use No    FAMILY HISTORY:   Family History  Problem Relation Age of Onset  . Breast cancer Maternal Aunt 30  . Heart disease Mother   . Heart attack Mother   . Heart disease Father   . Heart attack Father   . Diabetes Father     DRUG ALLERGIES:   Allergies  Allergen Reactions  . Morphine And Related Nausea And Vomiting    Severe n & v. Any other pain medications are okay  . Penicillins Hives    Has patient had a PCN reaction causing immediate rash, facial/tongue/throat swelling, SOB or lightheadedness with hypotension: No Has patient had a PCN reaction causing severe rash involving mucus membranes or skin necrosis: No Has patient had a PCN reaction that required hospitalization No  Has patient had a PCN reaction occurring within the last 10 years: No If all of the above answers are "NO", then may proceed with Cephalosporin use.     MEDICATIONS AT HOME:   Prior to Admission medications   Medication Sig Start Date End Date Taking? Authorizing Provider  amLODipine (NORVASC) 5 MG tablet Take 5 mg by mouth every evening.   Yes [provider]  aspirin EC 325 MG EC tablet Take 1 tablet (325 mg total) by mouth 2 (two) times daily. Patient taking differently: Take 81 mg by mouth daily.  06/20/16  Yes Juanell Fairly, MD  atorvastatin (LIPITOR) 40 MG tablet Take 40 mg by mouth at bedtime.  07/09/16  Yes [provider]  DULoxetine (CYMBALTA) 60 MG capsule Take 60 mg by mouth at bedtime.    Yes  [provider]  enalapril (VASOTEC) 20 MG tablet Take 20 mg by mouth 2 (two) times daily.   Yes [provider]  fluticasone (FLONASE) 50 MCG/ACT nasal spray Place 1-2 sprays into both nostrils daily as needed for allergies.    Yes [provider]  gabapentin (NEURONTIN) 300 MG capsule Take 600 mg by mouth at bedtime.   Yes [provider]  glimepiride (AMARYL) 4 MG tablet Take 4 mg by mouth 2 (two) times daily.   Yes [provider]  levothyroxine (SYNTHROID, LEVOTHROID) 75 MCG tablet Take 75 mcg by mouth daily before breakfast.   Yes [provider]  meloxicam (MOBIC) 7.5 MG tablet Take 7.5 mg by mouth 2 (two) times daily. 08/25/16  Yes [provider]  metFORMIN (GLUCOPHAGE) 1000 MG tablet Take 1,000 mg by mouth 2 (two) times daily with a meal.   Yes [provider]  pantoprazole (PROTONIX) 40 MG tablet Take 40 mg by mouth 2 (two) times daily.   Yes [provider]  pioglitazone (ACTOS) 45 MG tablet Take 45 mg by mouth daily.    Yes [provider]  Polyethyl Glycol-Propyl Glycol (SYSTANE) 0.4-0.3 % SOLN Place 1 drop into both eyes 3 (three) times daily as needed (for dry eyes).   Yes [provider]    REVIEW OF SYSTEMS:  Review of Systems  Constitutional: Negative for chills, fever, malaise/fatigue and weight loss.  HENT: Negative for ear pain, hearing loss and tinnitus.   Eyes: Negative for blurred vision, double vision, pain and redness.  Respiratory: Negative for cough, hemoptysis and shortness of breath.   Cardiovascular: Negative for chest pain, palpitations, orthopnea and leg swelling.  Gastrointestinal: Positive for abdominal pain, blood in stool, diarrhea, nausea and vomiting. Negative for constipation.  Genitourinary: Negative for dysuria, frequency and hematuria.  Musculoskeletal: Negative for back pain, joint pain and neck pain.  Skin:       No acne, rash, or lesions   Neurological: Negative for dizziness, tremors, focal weakness and weakness.  Endo/Heme/Allergies: Negative for polydipsia. Does not bruise/bleed easily.  Psychiatric/Behavioral: Negative for depression. The patient is not nervous/anxious and does not have insomnia.      VITAL SIGNS:   Vitals:   10/01/16 1731 10/01/16 1830 10/01/16 1900 10/01/16 1930  BP:  (!) 168/78 (!) 171/57 (!) 176/68  Pulse:  78 71 73  Resp:  19 19 13   Temp:      TempSrc:      SpO2:  98% 99% 99%  Weight: 79.4 kg (175 lb)     Height: 5' (1.524 m)      Wt Readings from Last 3 Encounters:  10/01/16  79.4 kg (175 lb)  06/19/16 78.5 kg (173 lb)  06/18/16 78.5 kg (173 lb)    PHYSICAL EXAMINATION:  Physical Exam  Vitals reviewed. Constitutional: She is oriented to person, place, and time. She appears well-developed and well-nourished. No distress.  HENT:  Head: Normocephalic and atraumatic.  Dry mucous membranes  Eyes: Pupils are equal, round, and reactive to light. Conjunctivae and EOM are normal. No scleral icterus.  Neck: Normal range of motion. Neck supple. No JVD present. No thyromegaly present.  Cardiovascular: Normal rate, regular rhythm and intact distal pulses.  Exam reveals no gallop and no friction rub.   No murmur heard. Respiratory: Effort normal and breath sounds normal. No respiratory distress. She has no wheezes. She has no rales.  GI: Soft. Bowel sounds are normal. She exhibits no distension. There is tenderness.  Musculoskeletal: Normal range of motion. She exhibits no edema.  No arthritis, no gout  Lymphadenopathy:    She has no cervical adenopathy.  Neurological: She is alert and oriented to person, place, and time. No cranial nerve deficit.  No dysarthria, no aphasia  Skin: Skin is warm and dry. No rash noted. No erythema.  Psychiatric: She has a normal mood and affect. Her behavior is normal. Judgment and thought content normal.    LABORATORY PANEL:   CBC  Recent Labs Lab  10/01/16 1756  WBC 20.9*  HGB 15.3  HCT 46.3  PLT 313   ------------------------------------------------------------------------------------------------------------------  Chemistries   Recent Labs Lab 10/01/16 1756  NA 138  K 4.9  CL 102  CO2 22  GLUCOSE 345*  BUN 33*  CREATININE 1.56*  CALCIUM 9.6  AST 29  ALT 21  ALKPHOS 83  BILITOT 1.0   ------------------------------------------------------------------------------------------------------------------  Cardiac Enzymes  Recent Labs Lab 10/01/16 1756  TROPONINI 0.10*   ------------------------------------------------------------------------------------------------------------------  RADIOLOGY:  Ct Abdomen Pelvis W Contrast  Result Date: 10/01/2016 CLINICAL DATA:  Lower abdominal pain for 1 day with nausea vomiting and diarrhea. Rectal bleeding. EXAM: CT ABDOMEN AND PELVIS WITH CONTRAST TECHNIQUE: Multidetector CT imaging of the abdomen and pelvis was performed using the standard protocol following bolus administration of intravenous contrast. CONTRAST:  75mL ISOVUE-300 IOPAMIDOL (ISOVUE-300) INJECTION 61% COMPARISON:  06/02/2016 FINDINGS: Lower chest: Calcified granuloma noted posterior left lower lobe. Heart is upper normal for size. Small hiatal hernia evident. Hepatobiliary: No focal abnormality within the liver parenchyma. Gallbladder surgically absent. No intrahepatic or extrahepatic biliary dilation. Pancreas: No focal mass lesion. No dilatation of the main duct. No intraparenchymal cyst. No peripancreatic edema. Spleen: No splenomegaly. No focal mass lesion. Adrenals/Urinary Tract: No adrenal nodule or mass. Scarring noted left kidney. Right kidney unremarkable. No evidence for hydroureter. The urinary bladder appears normal for the degree of distention. Stomach/Bowel: Tiny hiatal hernia. Stomach is nondistended. No gastric wall thickening. No evidence of outlet obstruction. Duodenum is normally positioned as is the  ligament of Treitz. No small bowel wall thickening. No small bowel dilatation. The terminal ileum is normal. The appendix is not visualized, but there is no edema or inflammation in the region of the cecum. Possible areas of wall thickening in the right colon. Although the left colon is nondistended, there is haziness around the left colon from the level of the splenic flexure (see image 30 series 2) down to the rectum. Mild circumferential wall thickening is suggested in the rectum. There is diffuse diverticulosis of the sigmoid colon without focal diverticulitis. Vascular/Lymphatic: There is abdominal aortic atherosclerosis without aneurysm. There is no gastrohepatic or hepatoduodenal ligament lymphadenopathy.  No intraperitoneal or retroperitoneal lymphadenopathy. No pelvic sidewall lymphadenopathy. Reproductive: Uterus surgically absent.  There is no adnexal mass. Other: Trace free fluid noted in the cul-de-sac. Musculoskeletal: Bone windows reveal no worrisome lytic or sclerotic osseous lesions. Pelvic floor laxity is again noted. Patient is status post lower lumbar fusion with chronic midline posterior fluid collection at the level of laminectomy. IMPRESSION: 1. Left colon is fluid-filled compatible the reported clinical history of diarrhea. There is subtle left pericolonic edema/inflammation with possible mild colonic wall thickening. Imaging features could be compatible with a infectious/inflammatory colitis. The very long segment involvement and distribution makes ischemia left likely. 2. Left colonic diverticulosis without features to suggest diverticulitis at this time. 3. Trace free fluid noted in the cul-de-sac. 4. Stable paraspinal fluid collection at the lumbar laminectomy defect, probably chronic seroma or hematoma. Electronically Signed   By: Kennith Center M.D.   On: 10/01/2016 20:34    EKG:   Orders placed or performed during the hospital encounter of 10/01/16  . EKG 12-Lead  . EKG 12-Lead     IMPRESSION AND PLAN:  Principal Problem:   Rectal bleeding - potentially due to her colitis. Her hemoglobin is stable. We will monitor her tonight, treated colitis as below, get a GI consult. Active Problems:   Colitis - seen on imaging, IV antibiotics ordered, stool studies ordered and pending, IV hydration for nausea and vomiting as well as when necessary antiemetics   Diabetes (HCC) - sliding scale insulin with corresponding glucose checks   CAD (coronary artery disease) - continue home meds, troponin is mildly elevated, suspect this is due to demand from her dehydration and stress from her illness. We will trend her enzymes tonight, get a cardiology consult.   HTN (hypertension) - stable, continue home meds   HLD (hyperlipidemia) - continue home meds   Depression with anxiety - home dose anxiolytics and antidepressants   Hypothyroidism - home dose thyroid replacement   GERD (gastroesophageal reflux disease) - home dose PPI  All the records are reviewed and case discussed with ED provider. Management plans discussed with the patient and/or family.  DVT PROPHYLAXIS: Mechanical only  GI PROPHYLAXIS: PPI  ADMISSION STATUS: Inpatient  CODE STATUS: Full Code Status History    Date Active Date Inactive Code Status Order ID Comments User Context   06/19/2016  3:38 PM 06/20/2016  4:13 PM Full Code [id number]  Juanell Fairly, MD Inpatient   06/02/2016  9:22 PM 06/04/2016  7:19 PM Full Code [id number]  Milagros Loll, MD ED   12/19/2015 11:14 AM 12/22/2015 10:35 PM Full Code [id number]  Poggi, Excell Seltzer, MD Inpatient      TOTAL TIME TAKING CARE OF THIS PATIENT: 45 minutes.   Itzayana Pardy FIELDING 10/01/2016, 9:01 PM  [facility]  Office  [phone]  CC: Primary care physician; Leotis Shames, MD  Note:  This document was prepared using Dragon voice recognition software and may include unintentional dictation errors.

## 2016-10-01 NOTE — Progress Notes (Signed)
Pharmacy Antibiotic Note  Alexandria MazeJudy W Marquez is a 71 y.o. female admitted on 10/01/2016 with intra-abdominal infection.  Pharmacy has been consulted for ciprofloxacin dosing.  Plan: Ciprofloxacin 400 mg IV q 12 hours ordered.  Height: 5' (152.4 cm) Weight: 175 lb (79.4 kg) IBW/kg (Calculated) : 45.5  Temp (24hrs), Avg:98.6 F (37 C), Min:98.6 F (37 C), Max:98.6 F (37 C)   Recent Labs Lab 10/01/16 1756  WBC 20.9*  CREATININE 1.56*    Estimated Creatinine Clearance: 30.9 mL/min (A) (by C-G formula based on SCr of 1.56 mg/dL (H)).    Allergies  Allergen Reactions  . Morphine And Related Nausea And Vomiting    Severe n & v. Any other pain medications are okay  . Penicillins Hives    Has patient had a PCN reaction causing immediate rash, facial/tongue/throat swelling, SOB or lightheadedness with hypotension: No Has patient had a PCN reaction causing severe rash involving mucus membranes or skin necrosis: No Has patient had a PCN reaction that required hospitalization No Has patient had a PCN reaction occurring within the last 10 years: No If all of the above answers are "NO", then may proceed with Cephalosporin use.     Antimicrobials this admission: ciprofloxacin metronidazole 7/12 >>    >>   Dose adjustments this admission:   Microbiology results: 7/12 GI panel pending    Thank you for allowing pharmacy to be a part of this patient's care.  Anushri Casalino S 10/01/2016 9:58 PM

## 2016-10-01 NOTE — ED Provider Notes (Signed)
[facility]-EMERGENCY DEPARTMENT Provider Note   CSN: [id number] Arrival date & time: 10/01/16  1726     History   Chief Complaint Chief Complaint  Patient presents with  . Weakness    HPI Alexandria Marquez is a 71 y.o. female history of CAD, diabetes, reflux, hypertension here presenting with vomiting, diarrhea, blood in stool. Patient states that she ate some [employer] yesterday and today started having some nonbilious nonbloody vomiting. Had 2 episodes of diarrhea that appears yellow and had some blood when she wipes. She then went to the bathroom and had 2 more episodes of just bright red blood per rectum with some clots. Patient has some chills but denies any fever. Patient states that she has history of diverticulitis and this feels similar. Patient is on aspirin but not on any blood thinners.  The history is provided by the patient.    Past Medical History:  Diagnosis Date  . Anxiety   . Arthritis   . Asthma    when young  . Coronary artery disease   . Depression   . Diabetes mellitus without complication (HCC)   . Environmental and seasonal allergies   . GERD (gastroesophageal reflux disease)   . History of hiatal hernia   . Hypertension   . Hypothyroidism   . Lower extremity edema   . Motion sickness   . Scoliosis   . Shortness of breath dyspnea    doe    Patient Active Problem List   Diagnosis Date Noted  . Ankle fracture, bimalleolar, closed, left, initial encounter 06/19/2016  . Enteritis 06/02/2016  . Status post total right knee replacement using cement 12/19/2015    Past Surgical History:  Procedure Laterality Date  . ABDOMINAL HYSTERECTOMY    . APPENDECTOMY    . BACK SURGERY  2014   lumbar fusion/plate/rods/screws  . CHOLECYSTECTOMY  2005  . CORONARY ARTERY BYPASS GRAFT     2008 2 vessels  . CORONARY ARTERY BYPASS GRAFT  2008  . HERNIA REPAIR  1987   incisional  . JOINT REPLACEMENT Right 2017   knee  . ORIF ANKLE FRACTURE Left 06/19/2016   Procedure: OPEN REDUCTION INTERNAL FIXATION (ORIF) ANKLE FRACTURE;  Surgeon: Juanell Fairly, MD;  Location: [facility] ORS;  Service: Orthopedics;  Laterality: Left;  . rcr    . ROTATOR CUFF REPAIR Left 2013  . TONSILLECTOMY    . TOTAL KNEE ARTHROPLASTY Right 12/19/2015   Procedure: TOTAL KNEE ARTHROPLASTY;  Surgeon: Christena Flake, MD;  Location: [facility] ORS;  Service: Orthopedics;  Laterality: Right;  . TUBAL LIGATION      OB History    No data available       Home Medications    Prior to Admission medications   Medication Sig Start Date End Date Taking? Authorizing Provider  amLODipine (NORVASC) 5 MG tablet Take 5 mg by mouth every evening.   Yes [provider]  aspirin EC 325 MG EC tablet Take 1 tablet (325 mg total) by mouth 2 (two) times daily. Patient taking differently: Take 81 mg by mouth daily.  06/20/16  Yes Juanell Fairly, MD  atorvastatin (LIPITOR) 40 MG tablet Take 40 mg by mouth at bedtime.  07/09/16  Yes [provider]  DULoxetine (CYMBALTA) 60 MG capsule Take 60 mg by mouth at bedtime.    Yes [provider]  enalapril (VASOTEC) 20 MG tablet Take 20 mg by mouth 2 (two) times daily.   Yes [provider]  fluticasone (FLONASE) 50 MCG/ACT nasal  spray Place 1-2 sprays into both nostrils daily as needed for allergies.    Yes [provider]  gabapentin (NEURONTIN) 300 MG capsule Take 600 mg by mouth at bedtime.   Yes [provider]  glimepiride (AMARYL) 4 MG tablet Take 4 mg by mouth 2 (two) times daily.   Yes [provider]  levothyroxine (SYNTHROID, LEVOTHROID) 75 MCG tablet Take 75 mcg by mouth daily before breakfast.   Yes [provider]  meloxicam (MOBIC) 7.5 MG tablet Take 7.5 mg by mouth 2 (two) times daily. 08/25/16  Yes [provider]  metFORMIN (GLUCOPHAGE) 1000 MG tablet Take 1,000 mg by mouth 2 (two) times daily with a meal.   Yes [provider]  pantoprazole (PROTONIX) 40 MG  tablet Take 40 mg by mouth 2 (two) times daily.   Yes [provider]  pioglitazone (ACTOS) 45 MG tablet Take 45 mg by mouth daily.    Yes [provider]  Polyethyl Glycol-Propyl Glycol (SYSTANE) 0.4-0.3 % SOLN Place 1 drop into both eyes 3 (three) times daily as needed (for dry eyes).   Yes [provider]  ondansetron (ZOFRAN) 4 MG tablet Take 1 tablet (4 mg total) by mouth every 8 (eight) hours as needed for nausea or vomiting. Patient not taking: Reported on 10/01/2016 06/02/16   Nita Sickle, MD  oxyCODONE (OXY IR/ROXICODONE) 5 MG immediate release tablet Take 1-2 tablets (5-10 mg total) by mouth every 4 (four) hours as needed for breakthrough pain ((for MODERATE breakthrough pain)). Patient not taking: Reported on 10/01/2016 06/20/16   Juanell Fairly, MD    Family History Family History  Problem Relation Age of Onset  . Breast cancer Maternal Aunt 30    Social History Social History  Substance Use Topics  . Smoking status: Never Smoker  . Smokeless tobacco: Never Used  . Alcohol use No     Allergies   Morphine and related and Penicillins   Review of Systems Review of Systems  Gastrointestinal: Positive for abdominal pain, diarrhea and vomiting.  Neurological: Positive for weakness.  All other systems reviewed and are negative.    Physical Exam Updated Vital Signs BP (!) 176/68   Pulse 73   Temp 98.6 F (37 C) (Oral)   Resp 13   Ht 5' (1.524 m)   Wt 79.4 kg (175 lb)   SpO2 99%   BMI 34.18 kg/m   Physical Exam  Constitutional: She is oriented to person, place, and time.  Slightly pale, dehydrated   HENT:  Head: Normocephalic.  MM dry   Eyes: Pupils are equal, round, and reactive to light. Conjunctivae and EOM are normal.  Neck: Normal range of motion. Neck supple.  Cardiovascular: Normal rate, regular rhythm and normal heart sounds.   Pulmonary/Chest: Effort normal and breath sounds normal. No respiratory distress. She has  no wheezes.  Abdominal: Soft.  Mild epigastric and LLQ tenderness   Genitourinary:  Genitourinary Comments: Rectal- no hemorrhoids. + bright red blood, guiac positive   Musculoskeletal: Normal range of motion.  Neurological: She is alert and oriented to person, place, and time. No cranial nerve deficit. Coordination normal.  Skin: Skin is warm.  Psychiatric: She has a normal mood and affect.  Nursing note and vitals reviewed.    ED Treatments / Results  Labs (all labs ordered are listed, but only abnormal results are displayed) Labs Reviewed  CBC WITH DIFFERENTIAL/PLATELET - Abnormal; Notable for the following:       Result Value  WBC 20.9 (*)    Neutro Abs 19.1 (*)    All other components within normal limits  COMPREHENSIVE METABOLIC PANEL - Abnormal; Notable for the following:    Glucose, Bld 345 (*)    BUN 33 (*)    Creatinine, Ser 1.56 (*)    GFR calc non Af Amer 32 (*)    GFR calc Af Amer 37 (*)    All other components within normal limits  TROPONIN I - Abnormal; Notable for the following:    Troponin I 0.10 (*)    All other components within normal limits  C DIFFICILE QUICK SCREEN W PCR REFLEX  GASTROINTESTINAL PANEL BY PCR, STOOL (REPLACES STOOL CULTURE)  PROTIME-INR  LIPASE, BLOOD  TYPE AND SCREEN    EKG  EKG Interpretation None       Radiology No results found.  Procedures Procedures (including critical care time)  Medications Ordered in ED Medications  ciprofloxacin (CIPRO) IVPB 400 mg (not administered)  metroNIDAZOLE (FLAGYL) IVPB 500 mg (not administered)  sodium chloride 0.9 % bolus 1,000 mL (1,000 mLs Intravenous New Bag/Given 10/01/16 1811)  pantoprazole (PROTONIX) 80 mg in sodium chloride 0.9 % 100 mL IVPB (80 mg Intravenous New Bag/Given 10/01/16 1852)  ondansetron (ZOFRAN) injection 4 mg (4 mg Intravenous Given 10/01/16 1811)  iopamidol (ISOVUE-300) 61 % injection 30 mL (30 mLs Oral Contrast Given 10/01/16 1752)  iopamidol (ISOVUE-300) 61  % injection 75 mL (75 mLs Intravenous Contrast Given 10/01/16 2002)     Initial Impression / Assessment and Plan / ED Course  I have reviewed the triage vital signs and the nursing notes.  Pertinent labs & imaging results that were available during my care of the patient were reviewed by me and considered in my medical decision making (see chart for details).    Micheline MazeJudy W Averitt is a 71 y.o. female here with vomiting, diarrhea, abdominal pain, blood in stool. Started with gastro symptoms and then had more pain and blood in stool. Consider gastro vs diverticulitis vs diverticulosis. Patient tachycardic on arrival. Will get labs, CT ab/pel. Will hydrate and likely admit.   8:33 PM WBC 20. Hg 15. Given IVF. Ordered cipro/flagyl empirically. Ordered c diff, GI pathogen panel. Hospitalist to admit for colitis, BRBPR.     Final Clinical Impressions(s) / ED Diagnoses   Final diagnoses:  None    New Prescriptions New Prescriptions   No medications on file     Charlynne PanderYao, Tamryn Popko Hsienta, MD 10/01/16 2034

## 2016-10-01 NOTE — ED Triage Notes (Signed)
Pt to triage via wheelchair.  Pt vomiting large amounts emesis in triage.  Pt has rectal bleeding today with dizziness and diarrhea.  Pt also has abd pain . Pt pale.

## 2016-10-01 NOTE — ED Notes (Signed)
Lab called elevated troponin of 0.10 - reported to Dr Silverio LayYao - no new orders at this time

## 2016-10-01 NOTE — ED Notes (Signed)
Pharmacy emailed to send protonix 

## 2016-10-02 ENCOUNTER — Inpatient Hospital Stay: Admit: 2016-10-02 | Payer: Medicare Other

## 2016-10-02 LAB — GLUCOSE, CAPILLARY
Glucose-Capillary: 136 mg/dL — ABNORMAL HIGH (ref 65–99)
Glucose-Capillary: 210 mg/dL — ABNORMAL HIGH (ref 65–99)
Glucose-Capillary: 162 mg/dL — ABNORMAL HIGH (ref 65–99)

## 2016-10-02 LAB — BASIC METABOLIC PANEL
Anion gap: 10 (ref 5–15)
BUN: 27 mg/dL — ABNORMAL HIGH (ref 6–20)
CO2: 24 mmol/L (ref 22–32)
Calcium: 8.6 mg/dL — ABNORMAL LOW (ref 8.9–10.3)
Chloride: 104 mmol/L (ref 101–111)
Creatinine, Ser: 1.22 mg/dL — ABNORMAL HIGH (ref 0.44–1.00)
GFR calc Af Amer: 50 mL/min — ABNORMAL LOW (ref >60)
GFR calc non Af Amer: 43 mL/min — ABNORMAL LOW (ref >60)
Glucose, Bld: 132 mg/dL — ABNORMAL HIGH (ref 65–99)
Potassium: 4.3 mmol/L (ref 3.5–5.1)
Sodium: 138 mmol/L (ref 135–145)

## 2016-10-02 LAB — CBC
HCT: 39.5 % (ref 35.0–47.0)
Hemoglobin: 13.1 g/dL (ref 12.0–16.0)
MCH: 29.6 pg (ref 26.0–34.0)
MCHC: 33.3 g/dL (ref 32.0–36.0)
MCV: 89 fL (ref 80.0–100.0)
Platelets: 260 10*3/uL (ref 150–440)
RBC: 4.44 MIL/uL (ref 3.80–5.20)
RDW: 14.2 % (ref 11.5–14.5)
WBC: 16.4 10*3/uL — ABNORMAL HIGH (ref 3.6–11.0)

## 2016-10-02 LAB — HEMOGLOBIN
Hemoglobin: 13.2 g/dL (ref 12.0–16.0)
Hemoglobin: 12.7 g/dL (ref 12.0–16.0)

## 2016-10-02 MED ORDER — GLIMEPIRIDE 4 MG PO TABS: 4.0000 mg | ORAL_TABLET | Freq: Two times a day (BID) | ORAL | Status: DC

## 2016-10-02 MED ORDER — METRONIDAZOLE 500 MG PO TABS
500.0000 mg | ORAL_TABLET | Freq: Three times a day (TID) | ORAL | Status: DC
  Administered 2016-10-02 – 2016-10-05 (×10): 500 mg via ORAL
  Filled 2016-10-02 (×11): qty 1

## 2016-10-02 MED ORDER — SODIUM CHLORIDE 0.9 % IV SOLN
INTRAVENOUS | Status: AC
  Administered 2016-10-02: 12:00:00 via INTRAVENOUS

## 2016-10-02 MED ORDER — POLYETHYL GLYCOL-PROPYL GLYCOL 0.4-0.3 % OP SOLN
1.0000 [drp] | Freq: Three times a day (TID) | OPHTHALMIC | Status: DC | PRN
  Filled 2016-10-02 (×2): qty 15

## 2016-10-02 MED ORDER — GLIMEPIRIDE 2 MG PO TABS
2.0000 mg | ORAL_TABLET | Freq: Every day | ORAL | Status: DC
  Administered 2016-10-02 – 2016-10-05 (×4): 2 mg via ORAL
  Filled 2016-10-02 (×4): qty 1

## 2016-10-02 NOTE — Care Management Important Message (Signed)
Important Message  Patient Details  Name: Alexandria MazeJudy W Cumba MRN: [id number] Date of Birth: 01/15/1946   Medicare Important Message Given:  Yes Signed IM notice given   Eber HongGreene, Leray Garverick R, RN 10/02/2016, 11:46 AM

## 2016-10-02 NOTE — Progress Notes (Signed)
Inpatient Diabetes Program Recommendations  AACE/ADA: New Consensus Statement on Inpatient Glycemic Control (2015)  Target Ranges:  Prepandial:   less than 140 mg/dL      Peak postprandial:   less than 180 mg/dL (1-2 hours)      Critically ill patients:  140 - 180 mg/dL   Lab Results  Component Value Date   GLUCAP 136 (H) 10/02/2016   HGBA1C 7.2 (H) 06/02/2016    Review of Glycemic Control  Results for Micheline MazeJONES, Alexandria W (MRN [id number]) as of 10/02/2016 10:30  Ref. Range 10/01/2016 22:59 10/02/2016 06:05  Glucose-Capillary Latest Ref Range: 65 - 99 mg/dL 409251 (H) 811136 (H)    Diabetes history: Type 2 Outpatient Diabetes medications: Actos 45 mg qday, Metformin 1000 mg bid, Amaryl 4 mg bid Current orders for Inpatient glycemic control: Amaryl 2 mg qday, Novolog 0-9 units q6h  Inpatient Diabetes Program Recommendations:  Please consider changing clear diet to carb modified. Since the patient has had some nausea and vomiting- I would consider d/c Amaryl.   Susette RacerJulie Melinda Pottinger, RN, BA, MHA, CDE Diabetes Coordinator Inpatient Diabetes Program  [phone] (Team Pager) [phone] [facility] Office) 10/02/2016 10:33 AM

## 2016-10-02 NOTE — Consult Note (Signed)
[facility] Cardiology Consultation Note  Patient ID: Alexandria Marquez, MRN: [id number], DOB/AGE: [birth year] 71 y.o. Admit date: 10/01/2016   Date of Consult: 10/02/2016 Primary Physician: Leotis Shames, MD Primary Cardiologist: Gwen Pounds  Chief Complaint:  Chief Complaint  Patient presents with  . Weakness   Reason for Consult: weakness gastroenteritis with elevated troponin  HPI: 71 y.o. female with the known coronary artery disease essential hypertension mixed hyperlipidemia having episodes of vasovagal syncope in the past and chronic dizziness who is had an acute issues of severe dizziness when out in the yard working last evening. The patient came in and had severe nausea vomiting and diarrhea with bloody stool. The patient has since had resolution of this issue at this time and is stable but is still feeling somewhat dizzy. The patient does have the slight elevation of troponin of 0.1 and glomerular filtration rate of 50 with a white blood cell count of 20 of unknown etiology although most consistent with a gastroenteritis and/or colitis. The patient does not have any chest discomfort or other significant symptoms at this time. Currently she is feeling comfortable  Past Medical History:  Diagnosis Date  . Anxiety   . Arthritis   . Asthma    when young  . Coronary artery disease   . Depression   . Diabetes mellitus without complication (HCC)   . Environmental and seasonal allergies   . GERD (gastroesophageal reflux disease)   . History of hiatal hernia   . Hypertension   . Hypothyroidism   . Lower extremity edema   . Motion sickness   . Scoliosis   . Shortness of breath dyspnea    doe      Surgical History:  Past Surgical History:  Procedure Laterality Date  . ABDOMINAL HYSTERECTOMY    . APPENDECTOMY    . BACK SURGERY  2014   lumbar fusion/plate/rods/screws  . CHOLECYSTECTOMY  2005  . CORONARY ARTERY BYPASS GRAFT     2008 2 vessels  . CORONARY ARTERY BYPASS GRAFT   2008  . HERNIA REPAIR  1987   incisional  . JOINT REPLACEMENT Right 2017   knee  . ORIF ANKLE FRACTURE Left 06/19/2016   Procedure: OPEN REDUCTION INTERNAL FIXATION (ORIF) ANKLE FRACTURE;  Surgeon: Juanell Fairly, MD;  Location: [facility] ORS;  Service: Orthopedics;  Laterality: Left;  . rcr    . ROTATOR CUFF REPAIR Left 2013  . TONSILLECTOMY    . TOTAL KNEE ARTHROPLASTY Right 12/19/2015   Procedure: TOTAL KNEE ARTHROPLASTY;  Surgeon: Christena Flake, MD;  Location: [facility] ORS;  Service: Orthopedics;  Laterality: Right;  . TUBAL LIGATION       Home Meds: Prior to Admission medications   Medication Sig Start Date End Date Taking? Authorizing Provider  amLODipine (NORVASC) 5 MG tablet Take 5 mg by mouth every evening.   Yes [provider]  aspirin EC 325 MG EC tablet Take 1 tablet (325 mg total) by mouth 2 (two) times daily. Patient taking differently: Take 81 mg by mouth daily.  06/20/16  Yes Juanell Fairly, MD  atorvastatin (LIPITOR) 40 MG tablet Take 40 mg by mouth at bedtime.  07/09/16  Yes [provider]  DULoxetine (CYMBALTA) 60 MG capsule Take 60 mg by mouth at bedtime.    Yes [provider]  enalapril (VASOTEC) 20 MG tablet Take 20 mg by mouth 2 (two) times daily.   Yes [provider]  fluticasone (FLONASE) 50 MCG/ACT nasal spray Place 1-2 sprays into both  nostrils daily as needed for allergies.    Yes [provider]  gabapentin (NEURONTIN) 300 MG capsule Take 600 mg by mouth at bedtime.   Yes [provider]  glimepiride (AMARYL) 4 MG tablet Take 4 mg by mouth 2 (two) times daily.   Yes [provider]  levothyroxine (SYNTHROID, LEVOTHROID) 75 MCG tablet Take 75 mcg by mouth daily before breakfast.   Yes [provider]  meloxicam (MOBIC) 7.5 MG tablet Take 7.5 mg by mouth 2 (two) times daily. 08/25/16  Yes [provider]  metFORMIN (GLUCOPHAGE) 1000 MG tablet Take 1,000 mg by mouth 2 (two) times daily with  a meal.   Yes [provider]  pantoprazole (PROTONIX) 40 MG tablet Take 40 mg by mouth 2 (two) times daily.   Yes [provider]  pioglitazone (ACTOS) 45 MG tablet Take 45 mg by mouth daily.    Yes [provider]  Polyethyl Glycol-Propyl Glycol (SYSTANE) 0.4-0.3 % SOLN Place 1 drop into both eyes 3 (three) times daily as needed (for dry eyes).   Yes [provider]    Inpatient Medications:  . amLODipine  5 mg Oral QPM  . aspirin  81 mg Oral Daily  . atorvastatin  40 mg Oral QHS  . DULoxetine  60 mg Oral QHS  . gabapentin  600 mg Oral QHS  . insulin aspart  0-9 Units Subcutaneous Q6H  . levothyroxine  75 mcg Oral QAC breakfast  . pantoprazole  40 mg Oral BID   . ciprofloxacin    . metronidazole Stopped (10/02/16 1610)    Allergies:  Allergies  Allergen Reactions  . Morphine And Related Nausea And Vomiting    Severe n & v. Any other pain medications are okay  . Penicillins Hives    Has patient had a PCN reaction causing immediate rash, facial/tongue/throat swelling, SOB or lightheadedness with hypotension: No Has patient had a PCN reaction causing severe rash involving mucus membranes or skin necrosis: No Has patient had a PCN reaction that required hospitalization No Has patient had a PCN reaction occurring within the last 10 years: No If all of the above answers are "NO", then may proceed with Cephalosporin use.     Social History   Social History  . Marital status: Married    Spouse name: N/A  . Number of children: N/A  . Years of education: N/A   Occupational History  . Not on file.   Social History Main Topics  . Smoking status: Never Smoker  . Smokeless tobacco: Never Used  . Alcohol use No  . Drug use: No  . Sexual activity: No   Other Topics Concern  . Not on file   Social History Narrative  . No narrative on file     Family History  Problem Relation Age of Onset  . Breast cancer Maternal Aunt 30  . Heart  disease Mother   . Heart attack Mother   . Heart disease Father   . Heart attack Father   . Diabetes Father      Review of Systems Positive for Nausea vomiting diarrhea Negative for: General:  chills, fever, night sweats or weight changes.  Cardiovascular: PND orthopnea syncope dizziness  Dermatological skin lesions rashes Respiratory: Cough congestion Urologic: Frequent urination urination at night and hematuria Abdominal: Positive for nausea, vomiting, diarrhea, bright red blood per rectum, negative for melena, or hematemesis Neurologic: negative for visual changes, and/or hearing changes  All other systems reviewed and are  otherwise negative except as noted above.  Labs:  Recent Labs  10/01/16 1756  TROPONINI 0.10*   Lab Results  Component Value Date   WBC 16.4 (H) 10/02/2016   HGB 13.1 10/02/2016   HCT 39.5 10/02/2016   MCV 89.0 10/02/2016   PLT 260 10/02/2016    Recent Labs Lab 10/01/16 1756 10/02/16 0518  NA 138 138  K 4.9 4.3  CL 102 104  CO2 22 24  BUN 33* 27*  CREATININE 1.56* 1.22*  CALCIUM 9.6 8.6*  PROT 7.2  --   BILITOT 1.0  --   ALKPHOS 83  --   ALT 21  --   AST 29  --   GLUCOSE 345* 132*   No results found for: CHOL, HDL, LDLCALC, TRIG No results found for: DDIMER  Radiology/Studies:  Ct Abdomen Pelvis W Contrast  Result Date: 10/01/2016 CLINICAL DATA:  Lower abdominal pain for 1 day with nausea vomiting and diarrhea. Rectal bleeding. EXAM: CT ABDOMEN AND PELVIS WITH CONTRAST TECHNIQUE: Multidetector CT imaging of the abdomen and pelvis was performed using the standard protocol following bolus administration of intravenous contrast. CONTRAST:  75mL ISOVUE-300 IOPAMIDOL (ISOVUE-300) INJECTION 61% COMPARISON:  06/02/2016 FINDINGS: Lower chest: Calcified granuloma noted posterior left lower lobe. Heart is upper normal for size. Small hiatal hernia evident. Hepatobiliary: No focal abnormality within the liver parenchyma. Gallbladder surgically  absent. No intrahepatic or extrahepatic biliary dilation. Pancreas: No focal mass lesion. No dilatation of the main duct. No intraparenchymal cyst. No peripancreatic edema. Spleen: No splenomegaly. No focal mass lesion. Adrenals/Urinary Tract: No adrenal nodule or mass. Scarring noted left kidney. Right kidney unremarkable. No evidence for hydroureter. The urinary bladder appears normal for the degree of distention. Stomach/Bowel: Tiny hiatal hernia. Stomach is nondistended. No gastric wall thickening. No evidence of outlet obstruction. Duodenum is normally positioned as is the ligament of Treitz. No small bowel wall thickening. No small bowel dilatation. The terminal ileum is normal. The appendix is not visualized, but there is no edema or inflammation in the region of the cecum. Possible areas of wall thickening in the right colon. Although the left colon is nondistended, there is haziness around the left colon from the level of the splenic flexure (see image 30 series 2) down to the rectum. Mild circumferential wall thickening is suggested in the rectum. There is diffuse diverticulosis of the sigmoid colon without focal diverticulitis. Vascular/Lymphatic: There is abdominal aortic atherosclerosis without aneurysm. There is no gastrohepatic or hepatoduodenal ligament lymphadenopathy. No intraperitoneal or retroperitoneal lymphadenopathy. No pelvic sidewall lymphadenopathy. Reproductive: Uterus surgically absent.  There is no adnexal mass. Other: Trace free fluid noted in the cul-de-sac. Musculoskeletal: Bone windows reveal no worrisome lytic or sclerotic osseous lesions. Pelvic floor laxity is again noted. Patient is status post lower lumbar fusion with chronic midline posterior fluid collection at the level of laminectomy. IMPRESSION: 1. Left colon is fluid-filled compatible the reported clinical history of diarrhea. There is subtle left pericolonic edema/inflammation with possible mild colonic wall thickening.  Imaging features could be compatible with a infectious/inflammatory colitis. The very long segment involvement and distribution makes ischemia left likely. 2. Left colonic diverticulosis without features to suggest diverticulitis at this time. 3. Trace free fluid noted in the cul-de-sac. 4. Stable paraspinal fluid collection at the lumbar laminectomy defect, probably chronic seroma or hematoma. Electronically Signed   By: Kennith CenterEric  Mansell M.D.   On: 10/01/2016 20:34    EKG: Normal sinus rhythm  Weights: Filed Weights   10/01/16 1731 10/01/16  2218  Weight: 79.4 kg (175 lb) 79 kg (174 lb 3.2 oz)     Physical Exam: Blood pressure (!) 101/54, pulse 79, temperature 97.8 F (36.6 C), temperature source Oral, resp. rate 19, height 5' (1.524 m), weight 79 kg (174 lb 3.2 oz), SpO2 100 %. Body mass index is 34.02 kg/m. General: Well developed, well nourished, in no acute distress. Head eyes ears nose throat: Normocephalic, atraumatic, sclera non-icteric, no xanthomas, nares are without discharge. No apparent thyromegaly and/or mass  Lungs: Normal respiratory effort.  no wheezes, no rales, no rhonchi.  Heart: RRR with normal S1 S2. no murmur gallop, no rub, PMI is normal size and placement, carotid upstroke normal without bruit, jugular venous pressure is normal Abdomen: Soft, non-tender, non-distended with normoactive bowel sounds. No hepatomegaly. No rebound/guarding. No obvious abdominal masses. Abdominal aorta is normal size without bruit Extremities: No edema. no cyanosis, no clubbing, no ulcers  Peripheral : 2+ bilateral upper extremity pulses, 2+ bilateral femoral pulses, 2+ bilateral dorsal pedal pulse Neuro: Alert and oriented. No facial asymmetry. No focal deficit. Moves all extremities spontaneously. Musculoskeletal: Normal muscle tone without kyphosis Psych:  Responds to questions appropriately with a normal affect.    Assessment: 71 year old female with known coronary artery disease a  essential hypertension mixed hyperlipidemia with acute gastroenteritis and or colitis with the bright red blood from rectum and a high white blood cell count without evidence of myocardial at this time  Plan: 1. Possible discontinuation of amlodipine if causing chronic dizziness and/or current concerns of hypotension 2. Continue high intensity cholesterol therapy for coronary artery disease 3. Serial ECG and enzymes to assess for possible myocardial infarction with minimal elevation of troponin 4. Echocardiogram for LV systolic dysfunction contributing to above 5. Further supportive care of gastroenteritis possible colitis and or concerns for infection  Signed, Lamar Blinks M.D. [facility] Cardiology 10/02/2016, 8:49 AM

## 2016-10-02 NOTE — Care Management (Signed)
No discharge needs identified by members of the care team 

## 2016-10-02 NOTE — Progress Notes (Signed)
Chaplain visited with patient and husband, while on rounds. Provided pastoral presence and words of encouragement. Offered silent prayer.     10/02/16 1135  Clinical Encounter Type  Visited With Patient;Patient and family together  Visit Type Initial;Spiritual support  Referral From Chaplain  Consult/Referral To Chaplain  Spiritual Encounters  Spiritual Needs Other (Comment)

## 2016-10-02 NOTE — Plan of Care (Signed)
Problem: Pain Managment: Goal: General experience of comfort will improve Outcome: Not Progressing Complaining of pain and nausea at times. Medication given. Will continue to monitor.

## 2016-10-02 NOTE — Progress Notes (Signed)
[facility] - [place] at [facility]   PATIENT NAME: Alexandria Marquez    MR#:  [id number]  DATE OF BIRTH:  1946/02/01  SUBJECTIVE:  Came in with several bright red blood per rectum that her stools and abdominal pain Continues to have small bloody bowel movements  REVIEW OF SYSTEMS:   Review of Systems  Constitutional: Negative for chills, fever and weight loss.  HENT: Negative for ear discharge, ear pain and nosebleeds.   Eyes: Negative for blurred vision, pain and discharge.  Respiratory: Negative for sputum production, shortness of breath, wheezing and stridor.   Cardiovascular: Negative for chest pain, palpitations, orthopnea and PND.  Gastrointestinal: Positive for blood in stool. Negative for abdominal pain, diarrhea, nausea and vomiting.  Genitourinary: Negative for frequency and urgency.  Musculoskeletal: Negative for back pain and joint pain.  Neurological: Positive for weakness. Negative for sensory change, speech change and focal weakness.  Psychiatric/Behavioral: Negative for depression and hallucinations. The patient is not nervous/anxious.    Tolerating Diet:cld Tolerating PT: Pending  DRUG ALLERGIES:   Allergies  Allergen Reactions  . Morphine And Related Nausea And Vomiting    Severe n & v. Any other pain medications are okay  . Penicillins Hives    Has patient had a PCN reaction causing immediate rash, facial/tongue/throat swelling, SOB or lightheadedness with hypotension: No Has patient had a PCN reaction causing severe rash involving mucus membranes or skin necrosis: No Has patient had a PCN reaction that required hospitalization No Has patient had a PCN reaction occurring within the last 10 years: No If all of the above answers are "NO", then may proceed with Cephalosporin use.     VITALS:  Blood pressure (!) 101/54, pulse 79, temperature 97.8 F (36.6 C), temperature source Oral, resp. rate 19, height 5' (1.524 m), weight 79 kg (174 lb  3.2 oz), SpO2 100 %.  PHYSICAL EXAMINATION:   Physical Exam  GENERAL:  71 y.o.-year-old patient lying in the bed with no acute distress.  EYES: Pupils equal, round, reactive to light and accommodation. No scleral icterus. Extraocular muscles intact.  HEENT: Head atraumatic, normocephalic. Oropharynx and nasopharynx clear.  NECK:  Supple, no jugular venous distention. No thyroid enlargement, no tenderness.  LUNGS: Normal breath sounds bilaterally, no wheezing, rales, rhonchi. No use of accessory muscles of respiration.  CARDIOVASCULAR: S1, S2 normal. No murmurs, rubs, or gallops.  ABDOMEN: Soft, diffusely tender, nondistended. Bowel sounds present. No organomegaly or mass.  EXTREMITIES: No cyanosis, clubbing or edema b/l.    NEUROLOGIC: Cranial nerves II through XII are intact. No focal Motor or sensory deficits b/l.   PSYCHIATRIC:  patient is alert and oriented x 3.  SKIN: No obvious rash, lesion, or ulcer.   LABORATORY PANEL:  CBC  Recent Labs Lab 10/02/16 0518  WBC 16.4*  HGB 13.1  HCT 39.5  PLT 260    Chemistries   Recent Labs Lab 10/01/16 1756 10/02/16 0518  NA 138 138  K 4.9 4.3  CL 102 104  CO2 22 24  GLUCOSE 345* 132*  BUN 33* 27*  CREATININE 1.56* 1.22*  CALCIUM 9.6 8.6*  AST 29  --   ALT 21  --   ALKPHOS 83  --   BILITOT 1.0  --    Cardiac Enzymes  Recent Labs Lab 10/01/16 1756  TROPONINI 0.10*   RADIOLOGY:  Ct Abdomen Pelvis W Contrast  Result Date: 10/01/2016 CLINICAL DATA:  Lower abdominal pain for 1 day with nausea vomiting and diarrhea.  Rectal bleeding. EXAM: CT ABDOMEN AND PELVIS WITH CONTRAST TECHNIQUE: Multidetector CT imaging of the abdomen and pelvis was performed using the standard protocol following bolus administration of intravenous contrast. CONTRAST:  75mL ISOVUE-300 IOPAMIDOL (ISOVUE-300) INJECTION 61% COMPARISON:  06/02/2016 FINDINGS: Lower chest: Calcified granuloma noted posterior left lower lobe. Heart is upper normal for size.  Small hiatal hernia evident. Hepatobiliary: No focal abnormality within the liver parenchyma. Gallbladder surgically absent. No intrahepatic or extrahepatic biliary dilation. Pancreas: No focal mass lesion. No dilatation of the main duct. No intraparenchymal cyst. No peripancreatic edema. Spleen: No splenomegaly. No focal mass lesion. Adrenals/Urinary Tract: No adrenal nodule or mass. Scarring noted left kidney. Right kidney unremarkable. No evidence for hydroureter. The urinary bladder appears normal for the degree of distention. Stomach/Bowel: Tiny hiatal hernia. Stomach is nondistended. No gastric wall thickening. No evidence of outlet obstruction. Duodenum is normally positioned as is the ligament of Treitz. No small bowel wall thickening. No small bowel dilatation. The terminal ileum is normal. The appendix is not visualized, but there is no edema or inflammation in the region of the cecum. Possible areas of wall thickening in the right colon. Although the left colon is nondistended, there is haziness around the left colon from the level of the splenic flexure (see image 30 series 2) down to the rectum. Mild circumferential wall thickening is suggested in the rectum. There is diffuse diverticulosis of the sigmoid colon without focal diverticulitis. Vascular/Lymphatic: There is abdominal aortic atherosclerosis without aneurysm. There is no gastrohepatic or hepatoduodenal ligament lymphadenopathy. No intraperitoneal or retroperitoneal lymphadenopathy. No pelvic sidewall lymphadenopathy. Reproductive: Uterus surgically absent.  There is no adnexal mass. Other: Trace free fluid noted in the cul-de-sac. Musculoskeletal: Bone windows reveal no worrisome lytic or sclerotic osseous lesions. Pelvic floor laxity is again noted. Patient is status post lower lumbar fusion with chronic midline posterior fluid collection at the level of laminectomy. IMPRESSION: 1. Left colon is fluid-filled compatible the reported clinical  history of diarrhea. There is subtle left pericolonic edema/inflammation with possible mild colonic wall thickening. Imaging features could be compatible with a infectious/inflammatory colitis. The very long segment involvement and distribution makes ischemia left likely. 2. Left colonic diverticulosis without features to suggest diverticulitis at this time. 3. Trace free fluid noted in the cul-de-sac. 4. Stable paraspinal fluid collection at the lumbar laminectomy defect, probably chronic seroma or hematoma. Electronically Signed   By: Kennith Center M.D.   On: 10/01/2016 20:34   ASSESSMENT AND PLAN:  Shayleen Eppinger  is a 71 y.o. female who presents with Abdominal pain that started this morning when she woke up. She had diarrhea this morning, and stated that she did not pay much attention due to that time in terms of having blood in it due to what she had last night for dinner  * Rectal bleeding - potentially due to her  Acute colitis. Her hemoglobin is stable.  _CT abdomen shows diverticulosis and thickening of colon -cipro and flagyl -H and H every 8 hourly  * Colitis - seen on imaging, IV antibiotics ordered -C diif colonization - IV hydration for nausea and vomiting as well as when necessary antiemetics  * Diabetes (HCC) - sliding scale insulin with corresponding glucose checks  * CAD (coronary artery disease) - continue home meds, troponin is mildly elevated, suspect this is due to demand from her dehydration and stress from her illness.  -no cp -hold asa due to GI bleed -pt seen by Dr Judie Grieve medical mnx  *  HTN (hypertension) - stable, continue home meds  *  HLD (hyperlipidemia) - continue home meds  *  Depression with anxiety - home dose anxiolytics and antidepressants   * Hypothyroidism - home dose thyroid replacement  *  GERD (gastroesophageal reflux disease) - home dose PPI  Case discussed with Care Management/Social Worker. Management plans discussed with the patient,  family and they are in agreement.  CODE STATUS: Full  DVT Prophylaxis: SCD/TEDS  TOTAL TIME TAKING CARE OF THIS PATIENT: 30 minutes.  >50% time spent on counselling and coordination of care  POSSIBLE D/C IN **1-2 DAYS, DEPENDING ON CLINICAL CONDITION.  Note: This dictation was prepared with Dragon dictation along with smaller phrase technology. Any transcriptional errors that result from this process are unintentional.  Cierra Rothgeb M.D on 10/02/2016 at 11:32 AM  Between 7am to 6pm - Pager - [phone]  After 6pm go to [email or web address] - Social research officer, governmentpassword EPAS [facility]  [facility]  Office  [phone]  CC: Primary care physician; Leotis ShamesSingh, Jasmine, MD

## 2016-10-03 LAB — GLUCOSE, CAPILLARY
Glucose-Capillary: 146 mg/dL — ABNORMAL HIGH (ref 65–99)
Glucose-Capillary: 149 mg/dL — ABNORMAL HIGH (ref 65–99)
Glucose-Capillary: 160 mg/dL — ABNORMAL HIGH (ref 65–99)
Glucose-Capillary: 185 mg/dL — ABNORMAL HIGH (ref 65–99)
Glucose-Capillary: 92 mg/dL (ref 65–99)

## 2016-10-03 LAB — CBC
HCT: 34 % — ABNORMAL LOW (ref 35.0–47.0)
Hemoglobin: 11.7 g/dL — ABNORMAL LOW (ref 12.0–16.0)
MCH: 30.6 pg (ref 26.0–34.0)
MCHC: 34.4 g/dL (ref 32.0–36.0)
MCV: 89 fL (ref 80.0–100.0)
Platelets: 205 10*3/uL (ref 150–440)
RBC: 3.83 MIL/uL (ref 3.80–5.20)
RDW: 14.3 % (ref 11.5–14.5)
WBC: 15.4 10*3/uL — ABNORMAL HIGH (ref 3.6–11.0)

## 2016-10-03 LAB — HEMOGLOBIN: Hemoglobin: 11.4 g/dL — ABNORMAL LOW (ref 12.0–16.0)

## 2016-10-03 MED ORDER — SODIUM CHLORIDE 0.9% FLUSH
3.0000 mL | Freq: Two times a day (BID) | INTRAVENOUS | Status: DC
  Administered 2016-10-03 – 2016-10-05 (×5): 3 mL via INTRAVENOUS

## 2016-10-03 MED ORDER — IRBESARTAN 150 MG PO TABS
150.0000 mg | ORAL_TABLET | Freq: Every day | ORAL | Status: DC
  Administered 2016-10-03 – 2016-10-05 (×3): 150 mg via ORAL
  Filled 2016-10-03 (×3): qty 1

## 2016-10-03 NOTE — Consult Note (Signed)
Referring Provider: Dr. Nemiah Commander Primary Care Physician:  Leotis Shames, MD Primary Gastroenterologist:  Gentry Fitz  Reason for Consultation:  Bloody diarrhea  HPI: Alexandria Marquez is a 71 y.o. female who had the acute onset of profuse bloody diarrhea along with diffuse crampy abdominal pain. Initially had red blood in her stool and she thought the red color was due to something she ate the night before and then she started having red blood with clots without stool. Abdominal pain was severe and persistent. Had several episodes of nonbloody vomitus and nausea as well with the onset of the abdominal pain and diarrhea. Denies fevers.  N/V has resolved and tolerating clear liquids. CT scan showed left-sided colitis. GI pathogen panel and C. Diff negative. She thinks she has a had a colonoscopy within the past 10 years and thinks she had colon polyps removed (records not available at this time). Reports having a loose stool this morning without any blood seen in it for the first time since the onset of the bleeding 2 days ago. Husband at bedside.  Past Medical History:  Diagnosis Date  . Anxiety   . Arthritis   . Asthma    when young  . Coronary artery disease   . Depression   . Diabetes mellitus without complication (HCC)   . Environmental and seasonal allergies   . GERD (gastroesophageal reflux disease)   . History of hiatal hernia   . Hypertension   . Hypothyroidism   . Lower extremity edema   . Motion sickness   . Scoliosis   . Shortness of breath dyspnea    doe    Past Surgical History:  Procedure Laterality Date  . ABDOMINAL HYSTERECTOMY    . APPENDECTOMY    . BACK SURGERY  2014   lumbar fusion/plate/rods/screws  . CHOLECYSTECTOMY  2005  . CORONARY ARTERY BYPASS GRAFT     2008 2 vessels  . CORONARY ARTERY BYPASS GRAFT  2008  . HERNIA REPAIR  1987   incisional  . JOINT REPLACEMENT Right 2017   knee  . ORIF ANKLE FRACTURE Left 06/19/2016   Procedure: OPEN REDUCTION INTERNAL  FIXATION (ORIF) ANKLE FRACTURE;  Surgeon: Juanell Fairly, MD;  Location: [facility];  Service: Orthopedics;  Laterality: Left;  . rcr    . ROTATOR CUFF REPAIR Left 2013  . TONSILLECTOMY    . TOTAL KNEE ARTHROPLASTY Right 12/19/2015   Procedure: TOTAL KNEE ARTHROPLASTY;  Surgeon: Christena Flake, MD;  Location: [facility] OR[facility];  Service: Orthopedics;  Laterality: Right;  . TUBAL LIGATION      Prior to Admission medications   Medication Sig Start Date End Date Taking? Authorizing Provider  amLODipine (NORVASC) 5 MG tablet Take 5 mg by mouth every evening.   Yes [provider]  aspirin EC 325 MG EC tablet Take 1 tablet (325 mg total) by mouth 2 (two) times daily. Patient taking differently: Take 81 mg by mouth daily.  06/20/16  Yes Juanell Fairly, MD  atorvastatin (LIPITOR) 40 MG tablet Take 40 mg by mouth at bedtime.  07/09/16  Yes [provider]  DULoxetine (CYMBALTA) 60 MG capsule Take 60 mg by mouth at bedtime.    Yes [provider]  enalapril (VASOTEC) 20 MG tablet Take 20 mg by mouth 2 (two) times daily.   Yes [provider]  fluticasone (FLONASE) 50 MCG/ACT nasal spray Place 1-2 sprays into both nostrils daily as needed for allergies.    Yes [provider]  gabapentin (NEURONTIN) 300 MG  capsule Take 600 mg by mouth at bedtime.   Yes [provider]  glimepiride (AMARYL) 4 MG tablet Take 4 mg by mouth 2 (two) times daily.   Yes [provider]  levothyroxine (SYNTHROID, LEVOTHROID) 75 MCG tablet Take 75 mcg by mouth daily before breakfast.   Yes [provider]  meloxicam (MOBIC) 7.5 MG tablet Take 7.5 mg by mouth 2 (two) times daily. 08/25/16  Yes [provider]  metFORMIN (GLUCOPHAGE) 1000 MG tablet Take 1,000 mg by mouth 2 (two) times daily with a meal.   Yes [provider]  pantoprazole (PROTONIX) 40 MG tablet Take 40 mg by mouth 2 (two) times daily.   Yes [provider]  pioglitazone (ACTOS)  45 MG tablet Take 45 mg by mouth daily.    Yes [provider]  Polyethyl Glycol-Propyl Glycol (SYSTANE) 0.4-0.3 % SOLN Place 1 drop into both eyes 3 (three) times daily as needed (for dry eyes).   Yes [provider]    Scheduled Meds: . atorvastatin  40 mg Oral QHS  . DULoxetine  60 mg Oral QHS  . gabapentin  600 mg Oral QHS  . glimepiride  2 mg Oral Q breakfast  . insulin aspart  0-9 Units Subcutaneous Q6H  . irbesartan  150 mg Oral Daily  . levothyroxine  75 mcg Oral QAC breakfast  . metroNIDAZOLE  500 mg Oral Q8H  . pantoprazole  40 mg Oral BID   Continuous Infusions: . ciprofloxacin 400 mg (10/03/16 0953)   PRN Meds:.acetaminophen **OR** acetaminophen, ondansetron **OR** ondansetron (ZOFRAN) IV, Polyethyl Glycol-Propyl Glycol, traMADol  Allergies as of 10/01/2016 - Review Complete 10/01/2016  Allergen Reaction Noted  . Morphine and related Nausea And Vomiting 12/04/2015  . Penicillins Hives 12/04/2015    Family History  Problem Relation Age of Onset  . Breast cancer Maternal Aunt 30  . Heart disease Mother   . Heart attack Mother   . Heart disease Father   . Heart attack Father   . Diabetes Father     Social History   Social History  . Marital status: Married    Spouse name: N/A  . Number of children: N/A  . Years of education: N/A   Occupational History  . Not on file.   Social History Main Topics  . Smoking status: Never Smoker  . Smokeless tobacco: Never Used  . Alcohol use No  . Drug use: No  . Sexual activity: No   Other Topics Concern  . Not on file   Social History Narrative  . No narrative on file    Review of Systems: All negative except as stated above in HPI.  Physical Exam: Vital signs: Vitals:   10/03/16 0301 10/03/16 0800  BP: (!) 136/47 (!) 126/57  Pulse: 84 86  Resp: 16 16  Temp: 98.8 F (37.1 C) 98.3 F (36.8 C)   Last BM Date: 10/03/16 General:   Alert,  Well-developed, well-nourished, pleasant and  cooperative in NAD HEENT: anicteric sclera, oropharynx clear Neck: supple, nontender Lungs:  Clear throughout to auscultation.   No wheezes, crackles, or rhonchi. No acute distress. Heart:  Regular rate and rhythm; no murmurs, clicks, rubs,  or gallops. Abdomen: diffusely tender with guarding, soft, nondistended, +BS  Rectal:  Deferred Ext: no edema  GI:  Lab Results:  Recent Labs  10/01/16 1756 10/02/16 0518  10/02/16 1801 10/03/16 0329 10/03/16 1106  WBC 20.9* 16.4*  --   --  15.4*  --  HGB 15.3 13.1  < > 12.7 11.7* 11.4*  HCT 46.3 39.5  --   --  34.0*  --   PLT 313 260  --   --  205  --   < > = values in this interval not displayed. BMET  Recent Labs  10/01/16 1756 10/02/16 0518  NA 138 138  K 4.9 4.3  CL 102 104  CO2 22 24  GLUCOSE 345* 132*  BUN 33* 27*  CREATININE 1.56* 1.22*  CALCIUM 9.6 8.6*   LFT  Recent Labs  10/01/16 1756  PROT 7.2  ALBUMIN 3.9  AST 29  ALT 21  ALKPHOS 83  BILITOT 1.0   PT/INR  Recent Labs  10/01/16 1756  LABPROT 15.1  INR 1.18     Studies/Results: Ct Abdomen Pelvis W Contrast  Result Date: 10/01/2016 CLINICAL DATA:  Lower abdominal pain for 1 day with nausea vomiting and diarrhea. Rectal bleeding. EXAM: CT ABDOMEN AND PELVIS WITH CONTRAST TECHNIQUE: Multidetector CT imaging of the abdomen and pelvis was performed using the standard protocol following bolus administration of intravenous contrast. CONTRAST:  75mL ISOVUE-300 IOPAMIDOL (ISOVUE-300) INJECTION 61% COMPARISON:  06/02/2016 FINDINGS: Lower chest: Calcified granuloma noted posterior left lower lobe. Heart is upper normal for size. Small hiatal hernia evident. Hepatobiliary: No focal abnormality within the liver parenchyma. Gallbladder surgically absent. No intrahepatic or extrahepatic biliary dilation. Pancreas: No focal mass lesion. No dilatation of the main duct. No intraparenchymal cyst. No peripancreatic edema. Spleen: No splenomegaly. No focal mass lesion.  Adrenals/Urinary Tract: No adrenal nodule or mass. Scarring noted left kidney. Right kidney unremarkable. No evidence for hydroureter. The urinary bladder appears normal for the degree of distention. Stomach/Bowel: Tiny hiatal hernia. Stomach is nondistended. No gastric wall thickening. No evidence of outlet obstruction. Duodenum is normally positioned as is the ligament of Treitz. No small bowel wall thickening. No small bowel dilatation. The terminal ileum is normal. The appendix is not visualized, but there is no edema or inflammation in the region of the cecum. Possible areas of wall thickening in the right colon. Although the left colon is nondistended, there is haziness around the left colon from the level of the splenic flexure (see image 30 series 2) down to the rectum. Mild circumferential wall thickening is suggested in the rectum. There is diffuse diverticulosis of the sigmoid colon without focal diverticulitis. Vascular/Lymphatic: There is abdominal aortic atherosclerosis without aneurysm. There is no gastrohepatic or hepatoduodenal ligament lymphadenopathy. No intraperitoneal or retroperitoneal lymphadenopathy. No pelvic sidewall lymphadenopathy. Reproductive: Uterus surgically absent.  There is no adnexal mass. Other: Trace free fluid noted in the cul-de-sac. Musculoskeletal: Bone windows reveal no worrisome lytic or sclerotic osseous lesions. Pelvic floor laxity is again noted. Patient is status post lower lumbar fusion with chronic midline posterior fluid collection at the level of laminectomy. IMPRESSION: 1. Left colon is fluid-filled compatible the reported clinical history of diarrhea. There is subtle left pericolonic edema/inflammation with possible mild colonic wall thickening. Imaging features could be compatible with a infectious/inflammatory colitis. The very long segment involvement and distribution makes ischemia left likely. 2. Left colonic diverticulosis without features to suggest  diverticulitis at this time. 3. Trace free fluid noted in the cul-de-sac. 4. Stable paraspinal fluid collection at the lumbar laminectomy defect, probably chronic seroma or hematoma. Electronically Signed   By: Kennith Center M.D.   On: 10/01/2016 20:34    Impression/Plan: Bloody diarrhea with abdominal pain with CT evidence of colitis - despite the long segment noted on CT  ischemic colitis remains in the differential. Infectious etiologies also possible. Doubt vasculitis, malignancy, or inflammatory bowel disease. Would continue on IV Abx, clear liquid diet. Ok to change to full liquids tomorrow if continues to tolerate the clear liquids. Outpatient colonoscopy unless bleeding persists. Will follow.    LOS: 2 days   Marika Mahaffy C.  10/03/2016, 12:01 PM

## 2016-10-03 NOTE — Progress Notes (Signed)
[facility] Cardiology [facility] Encounter Note  Patient: Alexandria Marquez / Admit Date: 10/01/2016 / Date of Encounter: 10/03/2016, 7:08 AM   Subjective: Patient does feel better today with less issues with dizziness is status post hydration. Some rectal bleeding still but much less than before. Dizziness is still somewhat of concern although may be considered to be side effect of amlodipine which she has had some side effect of this in the past  Review of Systems: Positive for: Dizziness rectal bleeding Negative for: Vision change, hearing change, syncope, dizziness, nausea, vomiting,diarrhea  stomach pain, cough, congestion, diaphoresis, urinary frequency, urinary pain,skin lesions, skin rashes Others previously listed  Objective: Telemetry: Normal sinus rhythm Physical Exam: Blood pressure (!) 136/47, pulse 84, temperature 98.8 F (37.1 C), temperature source Oral, resp. rate 16, height 5' (1.524 m), weight 79 kg (174 lb 3.2 oz), SpO2 95 %. Body mass index is 34.02 kg/m. General: Well developed, well nourished, in no acute distress. Head: Normocephalic, atraumatic, sclera non-icteric, no xanthomas, nares are without discharge. Neck: No apparent masses Lungs: Normal respirations with no wheezes, no rhonchi, no rales , no crackles   Heart: Regular rate and rhythm, normal S1 S2, no murmur, no rub, no gallop, PMI is normal size and placement, carotid upstroke normal without bruit, jugular venous pressure normal Abdomen: Soft, non-tender, non-distended with normoactive bowel sounds. No hepatosplenomegaly. Abdominal aorta is normal size without bruit Extremities: No edema, no clubbing, no cyanosis, no ulcers,  Peripheral: 2+ radial, 2+ femoral, 2+ dorsal pedal pulses Neuro: Alert and oriented. Moves all extremities spontaneously. Psych:  Responds to questions appropriately with a normal affect.   Intake/Output Summary (Last 24 hours) at 10/03/16 0708 Last data filed at 10/03/16 0515  Gross  per 24 hour  Intake          3163.75 ml  Output             1900 ml  Net          1263.75 ml    Inpatient Medications:  . atorvastatin  40 mg Oral QHS  . DULoxetine  60 mg Oral QHS  . gabapentin  600 mg Oral QHS  . glimepiride  2 mg Oral Q breakfast  . insulin aspart  0-9 Units Subcutaneous Q6H  . levothyroxine  75 mcg Oral QAC breakfast  . metroNIDAZOLE  500 mg Oral Q8H  . pantoprazole  40 mg Oral BID   Infusions:  . sodium chloride 75 mL/hr at 10/02/16 1143  . ciprofloxacin Stopped (10/02/16 2302)    Labs:  Recent Labs  10/01/16 1756 10/02/16 0518  NA 138 138  K 4.9 4.3  CL 102 104  CO2 22 24  GLUCOSE 345* 132*  BUN 33* 27*  CREATININE 1.56* 1.22*  CALCIUM 9.6 8.6*    Recent Labs  10/01/16 1756  AST 29  ALT 21  ALKPHOS 83  BILITOT 1.0  PROT 7.2  ALBUMIN 3.9    Recent Labs  10/01/16 1756 10/02/16 0518  10/02/16 1801 10/03/16 0329  WBC 20.9* 16.4*  --   --  15.4*  NEUTROABS 19.1*  --   --   --   --   HGB 15.3 13.1  < > 12.7 11.7*  HCT 46.3 39.5  --   --  34.0*  MCV 89.9 89.0  --   --  89.0  PLT 313 260  --   --  205  < > = values in this interval not displayed.  Recent Labs  10/01/16  1756  TROPONINI 0.10*   Invalid input(s): POCBNP No results for input(s): HGBA1C in the last 72 hours.   Weights: Filed Weights   10/01/16 1731 10/01/16 2218  Weight: 79.4 kg (175 lb) 79 kg (174 lb 3.2 oz)     Radiology/Studies:  Ct Abdomen Pelvis W Contrast  Result Date: 10/01/2016 CLINICAL DATA:  Lower abdominal pain for 1 day with nausea vomiting and diarrhea. Rectal bleeding. EXAM: CT ABDOMEN AND PELVIS WITH CONTRAST TECHNIQUE: Multidetector CT imaging of the abdomen and pelvis was performed using the standard protocol following bolus administration of intravenous contrast. CONTRAST:  75mL ISOVUE-300 IOPAMIDOL (ISOVUE-300) INJECTION 61% COMPARISON:  06/02/2016 FINDINGS: Lower chest: Calcified granuloma noted posterior left lower lobe. Heart is upper  normal for size. Small hiatal hernia evident. Hepatobiliary: No focal abnormality within the liver parenchyma. Gallbladder surgically absent. No intrahepatic or extrahepatic biliary dilation. Pancreas: No focal mass lesion. No dilatation of the main duct. No intraparenchymal cyst. No peripancreatic edema. Spleen: No splenomegaly. No focal mass lesion. Adrenals/Urinary Tract: No adrenal nodule or mass. Scarring noted left kidney. Right kidney unremarkable. No evidence for hydroureter. The urinary bladder appears normal for the degree of distention. Stomach/Bowel: Tiny hiatal hernia. Stomach is nondistended. No gastric wall thickening. No evidence of outlet obstruction. Duodenum is normally positioned as is the ligament of Treitz. No small bowel wall thickening. No small bowel dilatation. The terminal ileum is normal. The appendix is not visualized, but there is no edema or inflammation in the region of the cecum. Possible areas of wall thickening in the right colon. Although the left colon is nondistended, there is haziness around the left colon from the level of the splenic flexure (see image 30 series 2) down to the rectum. Mild circumferential wall thickening is suggested in the rectum. There is diffuse diverticulosis of the sigmoid colon without focal diverticulitis. Vascular/Lymphatic: There is abdominal aortic atherosclerosis without aneurysm. There is no gastrohepatic or hepatoduodenal ligament lymphadenopathy. No intraperitoneal or retroperitoneal lymphadenopathy. No pelvic sidewall lymphadenopathy. Reproductive: Uterus surgically absent.  There is no adnexal mass. Other: Trace free fluid noted in the cul-de-sac. Musculoskeletal: Bone windows reveal no worrisome lytic or sclerotic osseous lesions. Pelvic floor laxity is again noted. Patient is status post lower lumbar fusion with chronic midline posterior fluid collection at the level of laminectomy. IMPRESSION: 1. Left colon is fluid-filled compatible the  reported clinical history of diarrhea. There is subtle left pericolonic edema/inflammation with possible mild colonic wall thickening. Imaging features could be compatible with a infectious/inflammatory colitis. The very long segment involvement and distribution makes ischemia left likely. 2. Left colonic diverticulosis without features to suggest diverticulitis at this time. 3. Trace free fluid noted in the cul-de-sac. 4. Stable paraspinal fluid collection at the lumbar laminectomy defect, probably chronic seroma or hematoma. Electronically Signed   By: Kennith CenterEric  Mansell M.D.   On: 10/01/2016 20:34     Assessment and Recommendation  71 y.o. female with known cardiovascular disease and chronic kidney disease stage III with possible gastroenteritis of some sort with rectal bleeding and overall dizziness possibly secondary to amlodipine with minimal elevation of troponin consistent with demand ischemia and no current evidence of myocardial infarction. Patient has no chest pain or heart failure symptoms 1. Discontinuation of amlodipine due to concerns of side effects of dizziness 2. Angiotensin receptor blocker as able for hypertension control and renal protection 3. High intensity cholesterol therapy for previous cardiovascular disease 4. No further cardiac diagnostics necessary at this time 5. Proceed to in or  outpatient treatment of the rectal bleeding by gastroenterology without restriction and okay first discharge to home from cardiac standpoint  Signed, Arnoldo Hooker M.D. FACC

## 2016-10-03 NOTE — Progress Notes (Signed)
[facility] - [place] at [facility]   PATIENT NAME: Alexandria Marquez    MR#:  [id number]  DATE OF BIRTH:  [birth year]  SUBJECTIVE:   2 episodes of bright red blood per rectum again last night and this a.m. Complaints of cramping abdominal pain today.  REVIEW OF SYSTEMS:   Review of Systems  Constitutional: Negative for chills, fever and weight loss.  HENT: Negative for ear discharge, ear pain and nosebleeds.   Eyes: Negative for blurred vision, pain and discharge.  Respiratory: Negative for sputum production, shortness of breath, wheezing and stridor.   Cardiovascular: Negative for chest pain, palpitations, orthopnea and PND.  Gastrointestinal: Positive for blood in stool. Negative for abdominal pain, diarrhea, nausea and vomiting.  Genitourinary: Negative for frequency and urgency.  Musculoskeletal: Negative for back pain and joint pain.  Neurological: Positive for weakness. Negative for sensory change, speech change and focal weakness.  Psychiatric/Behavioral: Negative for depression and hallucinations. The patient is not nervous/anxious.    Tolerating Diet:cld  DRUG ALLERGIES:   Allergies  Allergen Reactions  . Morphine And Related Nausea And Vomiting    Severe n & v. Any other pain medications are okay  . Penicillins Hives    Has patient had a PCN reaction causing immediate rash, facial/tongue/throat swelling, SOB or lightheadedness with hypotension: No Has patient had a PCN reaction causing severe rash involving mucus membranes or skin necrosis: No Has patient had a PCN reaction that required hospitalization No Has patient had a PCN reaction occurring within the last 10 years: No If all of the above answers are "NO", then may proceed with Cephalosporin use.     VITALS:  Blood pressure (!) 126/57, pulse 86, temperature 98.3 F (36.8 C), temperature source Oral, resp. rate 16, height 5' (1.524 m), weight 79 kg (174 lb 3.2 oz), SpO2 97 %.  PHYSICAL  EXAMINATION:   Physical Exam  GENERAL:  71 y.o.-year-old patient lying in the bed with no acute distress.  EYES: Pupils equal, round, reactive to light and accommodation. No scleral icterus. Extraocular muscles intact.  HEENT: Head atraumatic, normocephalic. Oropharynx and nasopharynx clear.  NECK:  Supple, no jugular venous distention. No thyroid enlargement, no tenderness.  LUNGS: Normal breath sounds bilaterally, no wheezing, rales, rhonchi. No use of accessory muscles of respiration.  CARDIOVASCULAR: S1, S2 normal. No murmurs, rubs, or gallops.  ABDOMEN: Soft, diffusely tender, nondistended. Bowel sounds present. No organomegaly or mass.  EXTREMITIES: No cyanosis, clubbing or edema b/l.    NEUROLOGIC: Cranial nerves II through XII are intact. No focal Motor or sensory deficits b/l.   PSYCHIATRIC:  patient is alert and oriented x 3.  SKIN: No obvious rash, lesion, or ulcer.   LABORATORY PANEL:  CBC  Recent Labs Lab 10/03/16 0329  WBC 15.4*  HGB 11.7*  HCT 34.0*  PLT 205    Chemistries   Recent Labs Lab 10/01/16 1756 10/02/16 0518  NA 138 138  K 4.9 4.3  CL 102 104  CO2 22 24  GLUCOSE 345* 132*  BUN 33* 27*  CREATININE 1.56* 1.22*  CALCIUM 9.6 8.6*  AST 29  --   ALT 21  --   ALKPHOS 83  --   BILITOT 1.0  --    Cardiac Enzymes  Recent Labs Lab 10/01/16 1756  TROPONINI 0.10*   RADIOLOGY:  Ct Abdomen Pelvis W Contrast  Result Date: 10/01/2016 CLINICAL DATA:  Lower abdominal pain for 1 day with nausea vomiting and diarrhea. Rectal bleeding. EXAM: CT  ABDOMEN AND PELVIS WITH CONTRAST TECHNIQUE: Multidetector CT imaging of the abdomen and pelvis was performed using the standard protocol following bolus administration of intravenous contrast. CONTRAST:  75mL ISOVUE-300 IOPAMIDOL (ISOVUE-300) INJECTION 61% COMPARISON:  06/02/2016 FINDINGS: Lower chest: Calcified granuloma noted posterior left lower lobe. Heart is upper normal for size. Small hiatal hernia evident.  Hepatobiliary: No focal abnormality within the liver parenchyma. Gallbladder surgically absent. No intrahepatic or extrahepatic biliary dilation. Pancreas: No focal mass lesion. No dilatation of the main duct. No intraparenchymal cyst. No peripancreatic edema. Spleen: No splenomegaly. No focal mass lesion. Adrenals/Urinary Tract: No adrenal nodule or mass. Scarring noted left kidney. Right kidney unremarkable. No evidence for hydroureter. The urinary bladder appears normal for the degree of distention. Stomach/Bowel: Tiny hiatal hernia. Stomach is nondistended. No gastric wall thickening. No evidence of outlet obstruction. Duodenum is normally positioned as is the ligament of Treitz. No small bowel wall thickening. No small bowel dilatation. The terminal ileum is normal. The appendix is not visualized, but there is no edema or inflammation in the region of the cecum. Possible areas of wall thickening in the right colon. Although the left colon is nondistended, there is haziness around the left colon from the level of the splenic flexure (see image 30 series 2) down to the rectum. Mild circumferential wall thickening is suggested in the rectum. There is diffuse diverticulosis of the sigmoid colon without focal diverticulitis. Vascular/Lymphatic: There is abdominal aortic atherosclerosis without aneurysm. There is no gastrohepatic or hepatoduodenal ligament lymphadenopathy. No intraperitoneal or retroperitoneal lymphadenopathy. No pelvic sidewall lymphadenopathy. Reproductive: Uterus surgically absent.  There is no adnexal mass. Other: Trace free fluid noted in the cul-de-sac. Musculoskeletal: Bone windows reveal no worrisome lytic or sclerotic osseous lesions. Pelvic floor laxity is again noted. Patient is status post lower lumbar fusion with chronic midline posterior fluid collection at the level of laminectomy. IMPRESSION: 1. Left colon is fluid-filled compatible the reported clinical history of diarrhea. There is  subtle left pericolonic edema/inflammation with possible mild colonic wall thickening. Imaging features could be compatible with a infectious/inflammatory colitis. The very long segment involvement and distribution makes ischemia left likely. 2. Left colonic diverticulosis without features to suggest diverticulitis at this time. 3. Trace free fluid noted in the cul-de-sac. 4. Stable paraspinal fluid collection at the lumbar laminectomy defect, probably chronic seroma or hematoma. Electronically Signed   By: Kennith Center M.D.   On: 10/01/2016 20:34   ASSESSMENT AND PLAN:  Brandon Scarbrough  is a 71 y.o. female who presents with Abdominal pain that started this morning when she woke up. She had diarrhea this morning, and stated that she did not pay much attention due to that time in terms of having blood in it due to what she had last night for dinner  * Rectal bleeding - due to her  Acute colitis. Slow drop in hemoglobin from 13-11.7. No indication for transfusion.  _CT abdomen shows acute colitis and diverticulosis and thickening of colon -Continue cipro and flagyl -GI consult today. No indication for bleeding scan at this time. Continue to monitor. -Likely colonoscopy as outpatient once acute colitis is resolved   * Diabetes (HCC) - glimepiride and sliding scale insulin with corresponding glucose checks  * CAD (coronary artery disease) - continue home meds, troponin is mildly elevated, suspect this is due to demand from her dehydration and stress from her illness.  -no cp -hold asa due to GI bleed -pt seen by Dr Judie Grieve medical mnx  *  HTN (  hypertension) - stable, continue home meds  *  HLD (hyperlipidemia) - continue home meds  *  Depression with anxiety - home dose anxiolytics and antidepressants   * Hypothyroidism - continue Synthroid  *  GERD (gastroesophageal reflux disease) - home dose PPI  Case discussed with Care Management/Social Worker. Management plans discussed with the  patient, family and they are in agreement.  CODE STATUS: Full  DVT Prophylaxis: SCD/TEDS  TOTAL TIME TAKING CARE OF THIS PATIENT:  33 minutes.  >50% time spent on counselling and coordination of care  POSSIBLE D/C IN 1-2 DAYS, DEPENDING ON CLINICAL CONDITION.  Note: This dictation was prepared with Dragon dictation along with smaller phrase technology. Any transcriptional errors that result from this process are unintentional.  Shailey Butterbaugh M.D on 10/03/2016 at 8:33 AM  Between 7am to 6pm - Pager - [phone]  After 6pm go to [email or web address] - Social research officer, governmentpassword [facility]AS [email or web address]  [facility]  Office  [phone]  CC: Primary care physician; Leotis ShamesSingh, Jasmine, MD

## 2016-10-03 NOTE — Plan of Care (Signed)
Problem: Pain Managment: Goal: General experience of comfort will improve Outcome: Not Progressing Abdominal pain continues.  Prn meds given with relief.

## 2016-10-04 ENCOUNTER — Encounter: Payer: Self-pay | Admitting: *Deleted

## 2016-10-04 LAB — GLUCOSE, CAPILLARY
Glucose-Capillary: 181 mg/dL — ABNORMAL HIGH (ref 65–99)
Glucose-Capillary: 223 mg/dL — ABNORMAL HIGH (ref 65–99)
Glucose-Capillary: 240 mg/dL — ABNORMAL HIGH (ref 65–99)
Glucose-Capillary: 99 mg/dL (ref 65–99)

## 2016-10-04 LAB — CBC
HCT: 34.5 % — ABNORMAL LOW (ref 35.0–47.0)
Hemoglobin: 11.6 g/dL — ABNORMAL LOW (ref 12.0–16.0)
MCH: 29.9 pg (ref 26.0–34.0)
MCHC: 33.7 g/dL (ref 32.0–36.0)
MCV: 89 fL (ref 80.0–100.0)
Platelets: 211 10*3/uL (ref 150–440)
RBC: 3.88 MIL/uL (ref 3.80–5.20)
RDW: 14.3 % (ref 11.5–14.5)
WBC: 15.6 10*3/uL — ABNORMAL HIGH (ref 3.6–11.0)

## 2016-10-04 LAB — BASIC METABOLIC PANEL WITH GFR
Anion gap: 8 (ref 5–15)
BUN: 7 mg/dL (ref 6–20)
CO2: 25 mmol/L (ref 22–32)
Calcium: 8.1 mg/dL — ABNORMAL LOW (ref 8.9–10.3)
Chloride: 105 mmol/L (ref 101–111)
Creatinine, Ser: 0.93 mg/dL (ref 0.44–1.00)
GFR calc Af Amer: >60 mL/min
GFR calc non Af Amer: >60 mL/min
Glucose, Bld: 104 mg/dL — ABNORMAL HIGH (ref 65–99)
Potassium: 3.1 mmol/L — ABNORMAL LOW (ref 3.5–5.1)
Sodium: 138 mmol/L (ref 135–145)

## 2016-10-04 MED ORDER — POTASSIUM CHLORIDE CRYS ER 20 MEQ PO TBCR
40.0000 meq | EXTENDED_RELEASE_TABLET | ORAL | Status: AC
  Administered 2016-10-04 (×2): 40 meq via ORAL
  Filled 2016-10-04 (×2): qty 2

## 2016-10-04 MED ORDER — DICYCLOMINE HCL 10 MG PO CAPS
10.0000 mg | ORAL_CAPSULE | Freq: Three times a day (TID) | ORAL | Status: DC
  Administered 2016-10-04 – 2016-10-05 (×3): 10 mg via ORAL
  Filled 2016-10-04 (×3): qty 1

## 2016-10-04 NOTE — Progress Notes (Signed)
Notified Dr. Diamond K 3.1, no ectopy noted on telemetry at this time. Will continue to monitor.  

## 2016-10-04 NOTE — Progress Notes (Signed)
Gastroenterology Progress Note    Alexandria MazeJudy W Marquez 71 y.o. 06/24/1945   Subjective: Frequency and amount of bloody diarrhea improving but denies any form to her stool. Denies N/V. Tolerating soft diet. Reports back pain with walking.  Objective: Vital signs in last 24 hours: Vitals:   10/04/16 0500 10/04/16 0800  BP:  (!) 129/51  Pulse: 78 75  Resp:  16  Temp:  98.3 F (36.8 C)    Physical Exam: Gen: alert, no acute distress CV: RRR Chest: CTA B Abd: diffuse tenderness (less tender) with guarding, soft, nondistended, +BS  Lab Results:  Recent Labs  10/02/16 0518 10/04/16 0433  NA 138 138  K 4.3 3.1*  CL 104 105  CO2 24 25  GLUCOSE 132* 104*  BUN 27* 7  CREATININE 1.22* 0.93  CALCIUM 8.6* 8.1*    Recent Labs  10/01/16 1756  AST 29  ALT 21  ALKPHOS 83  BILITOT 1.0  PROT 7.2  ALBUMIN 3.9    Recent Labs  10/01/16 1756  10/03/16 0329 10/03/16 1106 10/04/16 0433  WBC 20.9*  < > 15.4*  --  15.6*  NEUTROABS 19.1*  --   --   --   --   HGB 15.3  < > 11.7* 11.4* 11.6*  HCT 46.3  < > 34.0*  --  34.5*  MCV 89.9  < > 89.0  --  89.0  PLT 313  < > 205  --  211  < > = values in this interval not displayed.  Recent Labs  10/01/16 1756  LABPROT 15.1  INR 1.18      Assessment/Plan: Acute colitis - suspect ischemic colitis. C. Diff antigen positive BUT C. Diff Toxin negative. GI pathogen panel negative. Amount of bleeding and frequency of diarrhea both decreasing. Continue IV Cipro/Flagyl for another day and consider changing to PO Abx tomorrow. If bleeding continues to resolve, then outpt colonoscopy needed but if bleeding continues then may need an inpt flex sig or colonoscopy. Continue supportive care. Home in next 2-3 days if continues to improve clinically. Dr. Tobi BastosAnna will f/u tomorrow.   [name] C. 10/04/2016, 9:18 AMPatient ID: Alexandria Marquez, female   DOB: 09/11/1945, 71 y.o.   MRN: [id number]

## 2016-10-04 NOTE — Progress Notes (Addendum)
[facility] - [place] at [facility]   PATIENT NAME: Alexandria Marquez    MR#:  [id number]  DATE OF BIRTH:  03/28/1945  SUBJECTIVE:   Diarrhea and rectal bleeding have improved. Last bleeding was last night. Still complaining of significant abdominal cramping. -Tolerating full liquid diet well  REVIEW OF SYSTEMS:   Review of Systems  Constitutional: Negative for chills, fever and weight loss.  HENT: Negative for ear discharge, ear pain and nosebleeds.   Eyes: Negative for blurred vision, pain and discharge.  Respiratory: Negative for sputum production, shortness of breath, wheezing and stridor.   Cardiovascular: Negative for chest pain, palpitations, orthopnea and PND.  Gastrointestinal: Positive for abdominal pain and blood in stool. Negative for diarrhea, nausea and vomiting.  Genitourinary: Negative for frequency and urgency.  Musculoskeletal: Negative for back pain and joint pain.  Neurological: Positive for weakness. Negative for sensory change, speech change and focal weakness.  Psychiatric/Behavioral: Negative for depression and hallucinations. The patient is not nervous/anxious.    Tolerating Diet: soft diet  DRUG ALLERGIES:   Allergies  Allergen Reactions  . Morphine And Related Nausea And Vomiting    Severe n & v. Any other pain medications are okay  . Penicillins Hives    Has patient had a PCN reaction causing immediate rash, facial/tongue/throat swelling, SOB or lightheadedness with hypotension: No Has patient had a PCN reaction causing severe rash involving mucus membranes or skin necrosis: No Has patient had a PCN reaction that required hospitalization No Has patient had a PCN reaction occurring within the last 10 years: No If all of the above answers are "NO", then may proceed with Cephalosporin use.     VITALS:  Blood pressure (!) 129/51, pulse 75, temperature 98.3 F (36.8 C), temperature source Oral, resp. rate 16, height 5' (1.524 m),  weight 79 kg (174 lb 3.2 oz), SpO2 97 %.  PHYSICAL EXAMINATION:   Physical Exam  GENERAL:  71 y.o.-year-old patient lying in the bed with no acute distress.  EYES: Pupils equal, round, reactive to light and accommodation. No scleral icterus. Extraocular muscles intact.  HEENT: Head atraumatic, normocephalic. Oropharynx and nasopharynx clear.  NECK:  Supple, no jugular venous distention. No thyroid enlargement, no tenderness.  LUNGS: Normal breath sounds bilaterally, no wheezing, rales, rhonchi. No use of accessory muscles of respiration.  CARDIOVASCULAR: S1, S2 normal. No murmurs, rubs, or gallops.  ABDOMEN: Soft, diffusely tender only in the lower abdomen today, nondistended. Bowel sounds present. No organomegaly or mass.  EXTREMITIES: No cyanosis, clubbing or edema b/l.    NEUROLOGIC: Cranial nerves II through XII are intact. No focal Motor or sensory deficits b/l.   PSYCHIATRIC:  patient is alert and oriented x 3.  SKIN: No obvious rash, lesion, or ulcer.   LABORATORY PANEL:  CBC  Recent Labs Lab 10/04/16 0433  WBC 15.6*  HGB 11.6*  HCT 34.5*  PLT 211    Chemistries   Recent Labs Lab 10/01/16 1756  10/04/16 0433  NA 138  < > 138  K 4.9  < > 3.1*  CL 102  < > 105  CO2 22  < > 25  GLUCOSE 345*  < > 104*  BUN 33*  < > 7  CREATININE 1.56*  < > 0.93  CALCIUM 9.6  < > 8.1*  AST 29  --   --   ALT 21  --   --   ALKPHOS 83  --   --   BILITOT 1.0  --   --   < > =  values in this interval not displayed. Cardiac Enzymes  Recent Labs Lab 10/01/16 1756  TROPONINI 0.10*   RADIOLOGY:  No results found. ASSESSMENT AND PLAN:  Alexandria Marquez  is a 71 y.o. female who presents with Abdominal pain that started this morning when she woke up. She had diarrhea this morning, and stated that she did not pay much attention due to that time in terms of having blood in it due to what she had last night for dinner  * Rectal bleeding - due to her  Acute colitis- infectious versus  ischemic. -. Slow drop in hemoglobin from 13-11.7. No indication for transfusion.  _CT abdomen shows acute colitis and diverticulosis and thickening of colon -Continue cipro and flagyl -GI consult appreciated. Continue to monitor. -Likely colonoscopy as outpatient once acute colitis is resolved   * Diabetes (HCC) - glimepiride and sliding scale insulin with corresponding glucose checks  * CAD (coronary artery disease) - continue home meds, troponin is mildly elevated, suspect this is due to demand from her dehydration and stress from her illness.  -hold asa due to GI bleed -pt seen by Dr Judie Grieve medical mnx, d/c tele today  *  HTN (hypertension) - stable, continue home meds  *  HLD (hyperlipidemia) - continue home meds  * Hypokalemia- replaced  *  Depression with anxiety - home dose anxiolytics and antidepressants   * Hypothyroidism - continue Synthroid  *  GERD (gastroesophageal reflux disease) - home dose PPI  Case discussed with Care Management/Social Worker. Management plans discussed with the patient, family and they are in agreement.  CODE STATUS: Full  DVT Prophylaxis: SCD/TEDS  TOTAL TIME TAKING CARE OF THIS PATIENT:  33 minutes.  >50% time spent on counselling and coordination of care  POSSIBLE D/C IN 1-2 DAYS, DEPENDING ON CLINICAL CONDITION.  Note: This dictation was prepared with Dragon dictation along with smaller phrase technology. Any transcriptional errors that result from this process are unintentional.  Eldred Sooy M.D on 10/04/2016 at 10:58 AM  Between 7am to 6pm - Pager - [phone]  After 6pm go to [email or web address] - Social research officer, government  [facility] [place] [facility]  Office  [phone]  CC: Primary care physician; Leotis Shames, MD

## 2016-10-05 DIAGNOSIS — K529 Noninfective gastroenteritis and colitis, unspecified: Principal | ICD-10-CM

## 2016-10-05 LAB — GLUCOSE, CAPILLARY
Glucose-Capillary: 164 mg/dL — ABNORMAL HIGH (ref 65–99)
Glucose-Capillary: 156 mg/dL — ABNORMAL HIGH (ref 65–99)
Glucose-Capillary: 164 mg/dL — ABNORMAL HIGH (ref 65–99)

## 2016-10-05 LAB — BASIC METABOLIC PANEL
Anion gap: 6 (ref 5–15)
BUN: 11 mg/dL (ref 6–20)
CO2: 26 mmol/L (ref 22–32)
Calcium: 8.2 mg/dL — ABNORMAL LOW (ref 8.9–10.3)
Chloride: 106 mmol/L (ref 101–111)
Creatinine, Ser: 1.08 mg/dL — ABNORMAL HIGH (ref 0.44–1.00)
GFR calc Af Amer: 58 mL/min — ABNORMAL LOW (ref >60)
GFR calc non Af Amer: 50 mL/min — ABNORMAL LOW (ref >60)
Glucose, Bld: 176 mg/dL — ABNORMAL HIGH (ref 65–99)
Potassium: 4.2 mmol/L (ref 3.5–5.1)
Sodium: 138 mmol/L (ref 135–145)

## 2016-10-05 MED ORDER — CIPROFLOXACIN HCL 500 MG PO TABS
500.0000 mg | ORAL_TABLET | Freq: Two times a day (BID) | ORAL | Status: DC
  Administered 2016-10-05: 500 mg via ORAL
  Filled 2016-10-05 (×2): qty 1

## 2016-10-05 MED ORDER — ASPIRIN EC 81 MG PO TBEC: 81.0000 mg | DELAYED_RELEASE_TABLET | Freq: Every day | ORAL | 0 refills | Status: AC

## 2016-10-05 MED ORDER — METRONIDAZOLE 500 MG PO TABS: 500.0000 mg | ORAL_TABLET | Freq: Three times a day (TID) | ORAL | 0 refills | Status: DC

## 2016-10-05 MED ORDER — GLIMEPIRIDE 2 MG PO TABS: 2.0000 mg | ORAL_TABLET | Freq: Two times a day (BID) | ORAL | 0 refills | Status: DC

## 2016-10-05 MED ORDER — PROBIOTIC 250 MG PO CAPS: 1.0000 | ORAL_CAPSULE | Freq: Every day | ORAL | 0 refills | Status: DC

## 2016-10-05 MED ORDER — INSULIN ASPART 100 UNIT/ML ~~LOC~~ SOLN
0.0000 [IU] | Freq: Three times a day (TID) | SUBCUTANEOUS | Status: DC
  Administered 2016-10-05 (×2): 2 [IU] via SUBCUTANEOUS
  Filled 2016-10-05 (×2): qty 1

## 2016-10-05 MED ORDER — TRAMADOL HCL 50 MG PO TABS
50.0000 mg | ORAL_TABLET | Freq: Four times a day (QID) | ORAL | 0 refills | Status: DC | PRN
Start: 2016-10-05 — End: 2019-09-29

## 2016-10-05 MED ORDER — DICYCLOMINE HCL 10 MG PO CAPS: 10.0000 mg | ORAL_CAPSULE | Freq: Three times a day (TID) | ORAL | 0 refills | Status: DC

## 2016-10-05 MED ORDER — CIPROFLOXACIN HCL 500 MG PO TABS: 500.0000 mg | ORAL_TABLET | Freq: Two times a day (BID) | ORAL | 0 refills | Status: DC

## 2016-10-05 NOTE — Progress Notes (Signed)
Discharge instructions explained to pt and pts spouse / verbalized an understanding/ iv removed/ transported off unit via wheelchair.

## 2016-10-05 NOTE — Care Management Important Message (Signed)
Important Message  Patient Details  Name: Alexandria Marquez MRN: [id number] Date of Birth: 08/31/1945   Medicare Important Message Given:  Yes    Eber HongGreene, Jacquese Hackman R, RN 10/05/2016, 11:22 AM

## 2016-10-05 NOTE — Progress Notes (Signed)
MD paged about getting blood sugar checks changed, currently pt on q6 hr checks with coverage. Pt is no longer NPO and is on a soft diet. Dr. Anne HahnWillis to change blood sugar checks to AC/HS. Will continue to monitor. Shirley FriarAlexis Miller, RN, BSN

## 2016-10-05 NOTE — Care Management (Signed)
patient declines need for home health physical therapy.  Informed attending.

## 2016-10-05 NOTE — Progress Notes (Signed)
Alexandria Mood MD, MRCP(U.K) [address]  [address]  [place], [place] [address]  Main: [phone]    Alexandria Marquez is being followed for bloody diarrhea  Day 2 of follow up   Subjective: Feels better, tolerating breakfast, very small bowel movement yesterday , abdominal pain much improved. Asking when she can go home.    Objective: Vital signs in last 24 hours: Vitals:   10/04/16 0800 10/04/16 1957 10/05/16 0414 10/05/16 0735  BP: (!) 129/51 (!) 117/46 108/62 138/75  Pulse: 75 73 78 76  Resp: 16 18 18 18   Temp: 98.3 F (36.8 C) 97.8 F (36.6 C) 98.3 F (36.8 C)   TempSrc: Oral Oral Oral   SpO2: 97% 98% 97% 97%  Weight:      Height:       Weight change:   Intake/Output Summary (Last 24 hours) at 10/05/16 0956 Last data filed at 10/05/16 0320  Gross per 24 hour  Intake              760 ml  Output              501 ml  Net              259 ml     Exam: Heart:: Regular rate and rhythm, S1S2 present or without murmur or extra heart sounds Lungs: normal, clear to auscultation and clear to auscultation and percussion Abdomen: soft, nontender, normal bowel sounds   Lab Results: @LABTEST2 @ Micro Results: Recent Results (from the past 240 hour(s))  C difficile quick scan w PCR reflex     Status: Abnormal   Collection Time: 10/01/16  9:50 PM  Result Value Ref Range Status   C Diff antigen POSITIVE (A) NEGATIVE Final   C Diff toxin NEGATIVE NEGATIVE Final   C Diff interpretation Results are indeterminate. See PCR results.  Final    Comment: VALID  Gastrointestinal Panel by PCR , Stool     Status: None   Collection Time: 10/01/16  9:50 PM  Result Value Ref Range Status   Campylobacter species NOT DETECTED NOT DETECTED Final   Plesimonas shigelloides NOT DETECTED NOT DETECTED Final   Salmonella species NOT DETECTED NOT DETECTED Final   Yersinia enterocolitica NOT DETECTED NOT DETECTED Final   Vibrio species NOT DETECTED NOT DETECTED Final   Vibrio cholerae NOT  DETECTED NOT DETECTED Final   Enteroaggregative E coli (EAEC) NOT DETECTED NOT DETECTED Final   Enteropathogenic E coli (EPEC) NOT DETECTED NOT DETECTED Final   Enterotoxigenic E coli (ETEC) NOT DETECTED NOT DETECTED Final   Shiga like toxin producing E coli (STEC) NOT DETECTED NOT DETECTED Final   Shigella/Enteroinvasive E coli (EIEC) NOT DETECTED NOT DETECTED Final   Cryptosporidium NOT DETECTED NOT DETECTED Final   Cyclospora cayetanensis NOT DETECTED NOT DETECTED Final   Entamoeba histolytica NOT DETECTED NOT DETECTED Final   Giardia lamblia NOT DETECTED NOT DETECTED Final   Adenovirus F40/41 NOT DETECTED NOT DETECTED Final   Astrovirus NOT DETECTED NOT DETECTED Final   Norovirus GI/GII NOT DETECTED NOT DETECTED Final   Rotavirus A NOT DETECTED NOT DETECTED Final   Sapovirus (I, II, IV, and V) NOT DETECTED NOT DETECTED Final  Clostridium Difficile by PCR     Status: None   Collection Time: 10/01/16  9:50 PM  Result Value Ref Range Status   Toxigenic C Difficile by pcr NEGATIVE NEGATIVE Final    Comment: Patient is colonized with non toxigenic C. difficile. May  not need treatment unless significant symptoms are present.   Studies/Results: No results found. Medications: I have reviewed the patient's current medications. Scheduled Meds: . atorvastatin  40 mg Oral QHS  . dicyclomine  10 mg Oral TID AC  . DULoxetine  60 mg Oral QHS  . gabapentin  600 mg Oral QHS  . glimepiride  2 mg Oral Q breakfast  . insulin aspart  0-9 Units Subcutaneous TID AC & HS  . irbesartan  150 mg Oral Daily  . levothyroxine  75 mcg Oral QAC breakfast  . metroNIDAZOLE  500 mg Oral Q8H  . pantoprazole  40 mg Oral BID  . sodium chloride flush  3 mL Intravenous Q12H   Continuous Infusions: . ciprofloxacin Stopped (10/05/16 1008)   PRN Meds:.acetaminophen **OR** acetaminophen, ondansetron **OR** ondansetron (ZOFRAN) IV, Polyethyl Glycol-Propyl Glycol, traMADol   Assessment: Principal Problem:    Rectal bleeding Active Problems:   Diabetes (HCC)   CAD (coronary artery disease)   HTN (hypertension)   HLD (hyperlipidemia)   Depression with anxiety   Hypothyroidism   GERD (gastroesophageal reflux disease)   Colitis  Alexandria Marquez 71 y.o. female admitted on 10/03/16 with acute colitis, C diff antigen positive but toxin negative , GI PCR negative, improved on Cipro/flagyl . Since she is improving - continue present management , advance diet as tolerated and follow up as an out patient to discuss about a colonoscopy . Home when tolerating PO diet, has no abdominal pain    LOS: 4 days   Alexandria Marquez 10/05/2016, 9:56 AM

## 2016-10-05 NOTE — Discharge Summary (Signed)
[employer] [facility] [place] [facility]   PATIENT NAME: Alexandria Marquez    MR#:  [id number]  DATE OF BIRTH:  1946/03/02  DATE OF ADMISSION:  10/01/2016   ADMITTING PHYSICIAN: Oralia Manis, MD  DATE OF DISCHARGE: 10/05/2016  1:27 PM  PRIMARY CARE PHYSICIAN: Leotis Shames, MD   ADMISSION DIAGNOSIS:   Colitis [K52.9]  DISCHARGE DIAGNOSIS:   Principal Problem:   Rectal bleeding Active Problems:   Diabetes (HCC)   CAD (coronary artery disease)   HTN (hypertension)   HLD (hyperlipidemia)   Depression with anxiety   Hypothyroidism   GERD (gastroesophageal reflux disease)   Colitis   SECONDARY DIAGNOSIS:   Past Medical History:  Diagnosis Date  . Anxiety   . Arthritis   . Asthma    when young  . Coronary artery disease   . Depression   . Diabetes mellitus without complication (HCC)   . Environmental and seasonal allergies   . GERD (gastroesophageal reflux disease)   . History of hiatal hernia   . Hypertension   . Hypothyroidism   . Lower extremity edema   . Motion sickness   . Scoliosis   . Shortness of breath dyspnea    doe    HOSPITAL COURSE:   JudyJonesis a 71 y.o.femalewho presents with Abdominal pain that started this morning when she woke up. She had diarrhea this morning, and stated that she did not pay much attention due to that time in terms of having blood in it due to what she had last night for dinner  * Rectal bleeding - due to her  Acute colitis- infectious versus ischemic. -. Slow drop in hemoglobin from 13-11.7. No indication for transfusion.  _CT abdomen shows acute colitis and diverticulosis and thickening of colon -Continue cipro and flagyl for 10 days and probiotics, much improved symptoms -GI consult appreciated. Continue to monitor. - bentyl added with improvement of abdominal cramps -colonoscopy as outpatient once acute colitis is resolved  *Diabetes (HCC) - glimepiride and metformin. Hold actos for now  *CAD  (coronary artery disease) - continue home meds, troponin is mildly elevated, suspect this is due to demand from her dehydration and stress from her illness.  -restart asa in 1 week if no bleeding recurrence -pt seen by Dr Judie Grieve medical mnx  *HTN (hypertension) - stable, continue home meds  *HLD (hyperlipidemia) - continue home meds  *Depression with anxiety - home dose anxiolytics and antidepressants  *Hypothyroidism - continue Synthroid  *GERD (gastroesophageal reflux disease) - home dose PPI  Stable, ambulated, generalized weakness, refused home health. Will be discharged today  DISCHARGE CONDITIONS:   Guarded  CONSULTS OBTAINED:   Treatment Team:  Lamar Blinks, MD Wyline Mood, MD  DRUG ALLERGIES:   Allergies  Allergen Reactions  . Morphine And Related Nausea And Vomiting    Severe n & v. Any other pain medications are okay  . Penicillins Hives    Has patient had a PCN reaction causing immediate rash, facial/tongue/throat swelling, SOB or lightheadedness with hypotension: No Has patient had a PCN reaction causing severe rash involving mucus membranes or skin necrosis: No Has patient had a PCN reaction that required hospitalization No Has patient had a PCN reaction occurring within the last 10 years: No If all of the above answers are "NO", then may proceed with Cephalosporin use.    DISCHARGE MEDICATIONS:   Allergies as of 10/05/2016      Reactions   Morphine And Related Nausea And Vomiting  Severe n & v. Any other pain medications are okay   Penicillins Hives   Has patient had a PCN reaction causing immediate rash, facial/tongue/throat swelling, SOB or lightheadedness with hypotension: No Has patient had a PCN reaction causing severe rash involving mucus membranes or skin necrosis: No Has patient had a PCN reaction that required hospitalization No Has patient had a PCN reaction occurring within the last 10 years: No If all of the  above answers are "NO", then may proceed with Cephalosporin use.      Medication List    STOP taking these medications   enalapril 20 MG tablet Commonly known as:  VASOTEC   meloxicam 7.5 MG tablet Commonly known as:  MOBIC   pioglitazone 45 MG tablet Commonly known as:  ACTOS     TAKE these medications   amLODipine 5 MG tablet Commonly known as:  NORVASC Take 5 mg by mouth every evening.   aspirin EC 81 MG tablet Take 1 tablet (81 mg total) by mouth daily. What changed:  medication strength  how much to take  when to take this   atorvastatin 40 MG tablet Commonly known as:  LIPITOR Take 40 mg by mouth at bedtime.   ciprofloxacin 500 MG tablet Commonly known as:  CIPRO Take 1 tablet (500 mg total) by mouth 2 (two) times daily. X 8 days   dicyclomine 10 MG capsule Commonly known as:  BENTYL Take 1 capsule (10 mg total) by mouth 3 (three) times daily before meals. X 10 days   DULoxetine 60 MG capsule Commonly known as:  CYMBALTA Take 60 mg by mouth at bedtime.   fluticasone 50 MCG/ACT nasal spray Commonly known as:  FLONASE Place 1-2 sprays into both nostrils daily as needed for allergies.   gabapentin 300 MG capsule Commonly known as:  NEURONTIN Take 600 mg by mouth at bedtime.   glimepiride 2 MG tablet Commonly known as:  AMARYL Take 1 tablet (2 mg total) by mouth 2 (two) times daily. What changed:  medication strength  how much to take   levothyroxine 75 MCG tablet Commonly known as:  SYNTHROID, LEVOTHROID Take 75 mcg by mouth daily before breakfast.   metFORMIN 1000 MG tablet Commonly known as:  GLUCOPHAGE Take 1,000 mg by mouth 2 (two) times daily with a meal.   metroNIDAZOLE 500 MG tablet Commonly known as:  FLAGYL Take 1 tablet (500 mg total) by mouth every 8 (eight) hours. X 8 days   pantoprazole 40 MG tablet Commonly known as:  PROTONIX Take 40 mg by mouth 2 (two) times daily.   Probiotic 250 MG Caps Take 1 capsule by mouth  daily. X 2 weeks   SYSTANE 0.4-0.3 % Soln Generic drug:  Polyethyl Glycol-Propyl Glycol Place 1 drop into both eyes 3 (three) times daily as needed (for dry eyes).   traMADol 50 MG tablet Commonly known as:  ULTRAM Take 1 tablet (50 mg total) by mouth every 6 (six) hours as needed for moderate pain or severe pain.        DISCHARGE INSTRUCTIONS:   1. PCP follow-up in 1-2 weeks 2. GI follow up in 2 weeks  DIET:   Cardiac diet  ACTIVITY:   Activity as tolerated  OXYGEN:   Home Oxygen: No.  Oxygen Delivery: room air  DISCHARGE LOCATION:   home   If you experience worsening of your admission symptoms, develop shortness of breath, life threatening emergency, suicidal or homicidal thoughts you must seek medical attention immediately by  calling 911 or calling your MD immediately  if symptoms less severe.  You Must read complete instructions/literature along with all the possible adverse reactions/side effects for all the Medicines you take and that have been prescribed to you. Take any new Medicines after you have completely understood and accpet all the possible adverse reactions/side effects.   Please note  You were cared for by a hospitalist during your hospital stay. If you have any questions about your discharge medications or the care you received while you were in the hospital after you are discharged, you can call the unit and asked to speak with the hospitalist on call if the hospitalist that took care of you is not available. Once you are discharged, your primary care physician will handle any further medical issues. Please note that NO REFILLS for any discharge medications will be authorized once you are discharged, as it is imperative that you return to your primary care physician (or establish a relationship with a primary care physician if you do not have one) for your aftercare needs so that they can reassess your need for medications and monitor your lab  values.    On the day of Discharge:  VITAL SIGNS:   Blood pressure (!) 131/55, pulse 70, temperature 98.3 F (36.8 C), temperature source Oral, resp. rate 18, height 5' (1.524 m), weight 79 kg (174 lb 3.2 oz), SpO2 97 %.  PHYSICAL EXAMINATION:   GENERAL:  71 y.o.-year-old patient lying in the bed with no acute distress.  EYES: Pupils equal, round, reactive to light and accommodation. No scleral icterus. Extraocular muscles intact.  HEENT: Head atraumatic, normocephalic. Oropharynx and nasopharynx clear.  NECK:  Supple, no jugular venous distention. No thyroid enlargement, no tenderness.  LUNGS: Normal breath sounds bilaterally, no wheezing, rales, rhonchi. No use of accessory muscles of respiration.  CARDIOVASCULAR: S1, S2 normal. No murmurs, rubs, or gallops.  ABDOMEN: Soft, much improved tenderness in the lower abdomen today, nondistended. Bowel sounds present. No organomegaly or mass.  EXTREMITIES: No cyanosis, clubbing or edema b/l.    NEUROLOGIC: Cranial nerves II through XII are intact. No focal Motor or sensory deficits b/l.   PSYCHIATRIC:  patient is alert and oriented x 3.  SKIN: No obvious rash, lesion, or ulcer.   DATA REVIEW:   CBC  Recent Labs Lab 10/04/16 0433  WBC 15.6*  HGB 11.6*  HCT 34.5*  PLT 211    Chemistries   Recent Labs Lab 10/01/16 1756  10/05/16 0441  NA 138  < > 138  K 4.9  < > 4.2  CL 102  < > 106  CO2 22  < > 26  GLUCOSE 345*  < > 176*  BUN 33*  < > 11  CREATININE 1.56*  < > 1.08*  CALCIUM 9.6  < > 8.2*  AST 29  --   --   ALT 21  --   --   ALKPHOS 83  --   --   BILITOT 1.0  --   --   < > = values in this interval not displayed.   Microbiology Results  Results for orders placed or performed during the hospital encounter of 10/01/16  C difficile quick scan w PCR reflex     Status: Abnormal   Collection Time: 10/01/16  9:50 PM  Result Value Ref Range Status   C Diff antigen POSITIVE (A) NEGATIVE Final   C Diff toxin NEGATIVE  NEGATIVE Final   C Diff interpretation Results are indeterminate.  See PCR results.  Final    Comment: VALID  Gastrointestinal Panel by PCR , Stool     Status: None   Collection Time: 10/01/16  9:50 PM  Result Value Ref Range Status   Campylobacter species NOT DETECTED NOT DETECTED Final   Plesimonas shigelloides NOT DETECTED NOT DETECTED Final   Salmonella species NOT DETECTED NOT DETECTED Final   Yersinia enterocolitica NOT DETECTED NOT DETECTED Final   Vibrio species NOT DETECTED NOT DETECTED Final   Vibrio cholerae NOT DETECTED NOT DETECTED Final   Enteroaggregative E coli (EAEC) NOT DETECTED NOT DETECTED Final   Enteropathogenic E coli (EPEC) NOT DETECTED NOT DETECTED Final   Enterotoxigenic E coli (ETEC) NOT DETECTED NOT DETECTED Final   Shiga like toxin producing E coli (STEC) NOT DETECTED NOT DETECTED Final   Shigella/Enteroinvasive E coli (EIEC) NOT DETECTED NOT DETECTED Final   Cryptosporidium NOT DETECTED NOT DETECTED Final   Cyclospora cayetanensis NOT DETECTED NOT DETECTED Final   Entamoeba histolytica NOT DETECTED NOT DETECTED Final   Giardia lamblia NOT DETECTED NOT DETECTED Final   Adenovirus F40/41 NOT DETECTED NOT DETECTED Final   Astrovirus NOT DETECTED NOT DETECTED Final   Norovirus GI/GII NOT DETECTED NOT DETECTED Final   Rotavirus A NOT DETECTED NOT DETECTED Final   Sapovirus (I, II, IV, and V) NOT DETECTED NOT DETECTED Final  Clostridium Difficile by PCR     Status: None   Collection Time: 10/01/16  9:50 PM  Result Value Ref Range Status   Toxigenic C Difficile by pcr NEGATIVE NEGATIVE Final    Comment: Patient is colonized with non toxigenic C. difficile. May not need treatment unless significant symptoms are present.    RADIOLOGY:  No results found.   Management plans discussed with the patient, family and they are in agreement.  CODE STATUS:     Code Status Orders        Start     Ordered   10/01/16 2153  Full code  Continuous     10/01/16 2153     Code Status History    Date Active Date Inactive Code Status Order ID Comments User Context   06/19/2016  3:38 PM 06/20/2016  4:13 PM Full Code [id number]  Juanell Fairly, MD Inpatient   06/02/2016  9:22 PM 06/04/2016  7:19 PM Full Code [id number]  Milagros Loll, MD ED   12/19/2015 11:14 AM 12/22/2015 10:35 PM Full Code [id number]  Poggi, Excell Seltzer, MD Inpatient      TOTAL TIME TAKING CARE OF THIS PATIENT: 37 minutes.    Enid Baas M.D on 10/05/2016 at 2:07 PM  Between 7am to 6pm - Pager - [phone]  After 6pm go to [email or web address] - Social research officer, government  [facility] [place] [facility]  Office  [phone]  CC: Primary care physician; Leotis Shames, MD   Note: This dictation was prepared with Dragon dictation along with smaller phrase technology. Any transcriptional errors that result from this process are unintentional.

## 2016-10-10 ENCOUNTER — Other Ambulatory Visit
Admission: RE | Admit: 2016-10-10 | Discharge: 2016-10-10 | Disposition: A | Discharge status: Home / Self Care | Payer: Medicare Other | Source: Ambulatory Visit | Attending: Gastroenterology | Admitting: Gastroenterology

## 2016-10-10 DIAGNOSIS — R197 Diarrhea, unspecified: Secondary | ICD-10-CM | POA: Insufficient documentation

## 2016-10-10 LAB — C DIFFICILE QUICK SCREEN W PCR REFLEX
C Diff antigen: NEGATIVE
C Diff interpretation: NOT DETECTED
C Diff toxin: NEGATIVE

## 2016-10-13 ENCOUNTER — Ambulatory Visit: Payer: Medicare Other | Admitting: Gastroenterology

## 2017-01-18 ENCOUNTER — Ambulatory Visit: Payer: Medicare Other | Admitting: Certified Registered Nurse Anesthetist

## 2017-01-18 ENCOUNTER — Encounter
Admission: RE | Disposition: A | Discharge status: Home / Self Care | Payer: Self-pay | Source: Ambulatory Visit | Attending: Gastroenterology

## 2017-01-18 ENCOUNTER — Encounter: Payer: Self-pay | Admitting: *Deleted

## 2017-01-18 ENCOUNTER — Ambulatory Visit
Admission: RE | Admit: 2017-01-18 | Discharge: 2017-01-18 | Disposition: A | Discharge status: Home / Self Care | Payer: Medicare Other | Source: Ambulatory Visit | Attending: Gastroenterology | Admitting: Gastroenterology

## 2017-01-18 DIAGNOSIS — Z7984 Long term (current) use of oral hypoglycemic drugs: Secondary | ICD-10-CM | POA: Diagnosis not present

## 2017-01-18 DIAGNOSIS — K6289 Other specified diseases of anus and rectum: Secondary | ICD-10-CM | POA: Insufficient documentation

## 2017-01-18 DIAGNOSIS — E039 Hypothyroidism, unspecified: Secondary | ICD-10-CM | POA: Diagnosis not present

## 2017-01-18 DIAGNOSIS — K219 Gastro-esophageal reflux disease without esophagitis: Secondary | ICD-10-CM | POA: Diagnosis not present

## 2017-01-18 DIAGNOSIS — Z96659 Presence of unspecified artificial knee joint: Secondary | ICD-10-CM | POA: Insufficient documentation

## 2017-01-18 DIAGNOSIS — I10 Essential (primary) hypertension: Secondary | ICD-10-CM | POA: Insufficient documentation

## 2017-01-18 DIAGNOSIS — K5730 Diverticulosis of large intestine without perforation or abscess without bleeding: Secondary | ICD-10-CM | POA: Diagnosis not present

## 2017-01-18 DIAGNOSIS — J302 Other seasonal allergic rhinitis: Secondary | ICD-10-CM | POA: Diagnosis not present

## 2017-01-18 DIAGNOSIS — F329 Major depressive disorder, single episode, unspecified: Secondary | ICD-10-CM | POA: Diagnosis not present

## 2017-01-18 DIAGNOSIS — R197 Diarrhea, unspecified: Secondary | ICD-10-CM | POA: Diagnosis not present

## 2017-01-18 DIAGNOSIS — Z79899 Other long term (current) drug therapy: Secondary | ICD-10-CM | POA: Diagnosis not present

## 2017-01-18 DIAGNOSIS — Z79890 Hormone replacement therapy (postmenopausal): Secondary | ICD-10-CM | POA: Diagnosis not present

## 2017-01-18 DIAGNOSIS — Z6834 Body mass index (BMI) 34.0-34.9, adult: Secondary | ICD-10-CM | POA: Insufficient documentation

## 2017-01-18 DIAGNOSIS — I2510 Atherosclerotic heart disease of native coronary artery without angina pectoris: Secondary | ICD-10-CM | POA: Diagnosis not present

## 2017-01-18 DIAGNOSIS — Z7951 Long term (current) use of inhaled steroids: Secondary | ICD-10-CM | POA: Diagnosis not present

## 2017-01-18 DIAGNOSIS — F419 Anxiety disorder, unspecified: Secondary | ICD-10-CM | POA: Diagnosis not present

## 2017-01-18 DIAGNOSIS — Z7982 Long term (current) use of aspirin: Secondary | ICD-10-CM | POA: Diagnosis not present

## 2017-01-18 DIAGNOSIS — E119 Type 2 diabetes mellitus without complications: Secondary | ICD-10-CM | POA: Diagnosis not present

## 2017-01-18 HISTORY — PX: COLONOSCOPY WITH PROPOFOL: SHX5780

## 2017-01-18 LAB — GLUCOSE, CAPILLARY: Glucose-Capillary: 192 mg/dL — ABNORMAL HIGH (ref 65–99)

## 2017-01-18 SURGERY — COLONOSCOPY WITH PROPOFOL
Anesthesia: General

## 2017-01-18 MED ORDER — LIDOCAINE HCL (PF) 2 % IJ SOLN
INTRAMUSCULAR | Status: AC
  Filled 2017-01-18: qty 10

## 2017-01-18 MED ORDER — EPHEDRINE SULFATE 50 MG/ML IJ SOLN
INTRAMUSCULAR | Status: DC | PRN
  Administered 2017-01-18 (×2): 5 mg via INTRAVENOUS

## 2017-01-18 MED ORDER — PROPOFOL 10 MG/ML IV BOLUS
INTRAVENOUS | Status: DC | PRN
  Administered 2017-01-18: 50 mg via INTRAVENOUS
  Administered 2017-01-18: 20 mg via INTRAVENOUS

## 2017-01-18 MED ORDER — SODIUM CHLORIDE 0.9 % IV SOLN: INTRAVENOUS | Status: DC

## 2017-01-18 MED ORDER — CLINDAMYCIN PHOSPHATE 600 MG/50ML IV SOLN
INTRAVENOUS | Status: AC
  Filled 2017-01-18: qty 50

## 2017-01-18 MED ORDER — PHENYLEPHRINE HCL 10 MG/ML IJ SOLN
INTRAMUSCULAR | Status: DC | PRN
  Administered 2017-01-18: 100 ug via INTRAVENOUS

## 2017-01-18 MED ORDER — EPHEDRINE SULFATE 50 MG/ML IJ SOLN
INTRAMUSCULAR | Status: AC
  Filled 2017-01-18: qty 1

## 2017-01-18 MED ORDER — LABETALOL HCL 5 MG/ML IV SOLN
INTRAVENOUS | Status: DC | PRN
  Administered 2017-01-18: 5 mg via INTRAVENOUS

## 2017-01-18 MED ORDER — SODIUM CHLORIDE 0.9 % IV SOLN
INTRAVENOUS | Status: DC
  Administered 2017-01-18: 1000 mL via INTRAVENOUS
  Administered 2017-01-18: 09:00:00 via INTRAVENOUS

## 2017-01-18 MED ORDER — LABETALOL HCL 5 MG/ML IV SOLN
INTRAVENOUS | Status: AC
  Filled 2017-01-18: qty 4

## 2017-01-18 MED ORDER — CLINDAMYCIN PHOSPHATE 600 MG/50ML IV SOLN
600.0000 mg | Freq: Once | INTRAVENOUS | Status: AC
  Administered 2017-01-18: 600 mg via INTRAVENOUS

## 2017-01-18 MED ORDER — PROPOFOL 500 MG/50ML IV EMUL
INTRAVENOUS | Status: DC | PRN
  Administered 2017-01-18: 90 ug/kg/min via INTRAVENOUS

## 2017-01-18 MED ORDER — PROPOFOL 10 MG/ML IV BOLUS
INTRAVENOUS | Status: AC
  Filled 2017-01-18: qty 40

## 2017-01-18 MED ORDER — LIDOCAINE HCL (CARDIAC) 20 MG/ML IV SOLN
INTRAVENOUS | Status: DC | PRN
  Administered 2017-01-18: 80 mg via INTRAVENOUS

## 2017-01-18 MED ORDER — PHENYLEPHRINE HCL 10 MG/ML IJ SOLN
INTRAMUSCULAR | Status: AC
  Filled 2017-01-18: qty 1

## 2017-01-18 MED ORDER — PROPOFOL 500 MG/50ML IV EMUL
INTRAVENOUS | Status: AC
  Filled 2017-01-18: qty 50

## 2017-01-18 NOTE — Op Note (Addendum)
[facility] Gastroenterology Patient Name: Alexandria Marquez Procedure Date: 01/18/2017 8:34 AM MRN: [id number] Account #: [id number] Date of Birth: 04/17/1945 Admit Type: Outpatient Age: 71 [facility] Gender: Female Note Status: Supervisor Override Procedure:            Colonoscopy Indications:          Clinically significant diarrhea of unexplained origin,                        Abnormal CT of the GI tract Providers:            Christena DeemMartin U. Kay Ricciuti, MD Referring MD:         Leotis ShamesJasmine Singh (Referring MD) Medicines:            Monitored Anesthesia Care Complications:        No immediate complications. Procedure:            Pre-Anesthesia Assessment:                       - ASA Grade Assessment: III - A patient with severe                        systemic disease.                       After obtaining informed consent, the colonoscope was                        passed under direct vision. Throughout the procedure,                        the patient's blood pressure, pulse, and oxygen                        saturations were monitored continuously. The                        Colonoscope was introduced through the anus and                        advanced to the the cecum, identified by appendiceal                        orifice and ileocecal valve. The colonoscopy was                        performed without difficulty. The patient tolerated the                        procedure well. The quality of the bowel preparation                        was fair. Findings:      Multiple small top medium-mouthed diverticula were found in the sigmoid       colon, descending colon, transverse colon and ascending colon.      Biopsies for histology were taken with a cold forceps from the right       colon and left colon for evaluation of microscopic colitis.      The IC valve was turned open and found to be normal in appearance.  Patchy mild inflammation characterized by  erythema was found in the       rectum. Biopsies were taken with a cold forceps for histology.      The digital rectal exam was normal. Impression:           - Preparation of the colon was fair.                       - Diverticulosis in the sigmoid colon, in the                        descending colon, in the transverse colon and in the                        ascending colon.                       - Patchy mild inflammation was found secondary to                        proctitis. Biopsied.                       - Biopsies were taken with a cold forceps from the                        right colon and left colon for evaluation of                        microscopic colitis. Recommendation:       - Discharge patient to home.                       - Await pathology results.                       - Review of recent abdominal CT prior to the procedure                        shows some plaque at the origin of the superior                        mesenteric and celiac. Will request a referral to                        vascular surgery for further evaluation. Procedure Code(s):    --- Professional ---                       [phone], Colonoscopy, flexible; with biopsy, single or                        multiple Diagnosis Code(s):    --- Professional ---                       K62.89, Other specified diseases of anus and rectum                       R19.7, Diarrhea, unspecified                       K57.30, Diverticulosis of large intestine without  perforation or abscess without bleeding                       R93.3, Abnormal findings on diagnostic imaging of other                        parts of digestive tract CPT copyright 2016 American Medical Association. All rights reserved. The codes documented in this report are preliminary and upon coder review may  be revised to meet current compliance requirements. Christena Deem, MD 01/18/2017 9:36:59 AM This report has been signed  electronically. Number of Addenda: 0 Note Initiated On: 01/18/2017 8:34 AM Scope Withdrawal Time: 0 hours 9 minutes 21 seconds  Total Procedure Duration: 0 hours 20 minutes 58 seconds       [facility]

## 2017-01-18 NOTE — Anesthesia Post-op Follow-up Note (Signed)
Anesthesia QCDR form completed.        

## 2017-01-18 NOTE — H&P (Signed)
Outpatient short stay form Pre-procedure 01/18/2017 8:58 AM Christena DeemMartin U Trini Christiansen MD  Primary Physician: Dr. Leotis ShamesJasmine Singh  Reason for visit:  Colonoscopy  History of present illness:  Patient is a 71 year old female presenting today as above. She had a hospitalization in July 2018 with rectal bleeding. At that time a CT scan was done that indicated left-sided colitis. Also some abnormality noted in the right colon. She has had multiple laboratory studies to rule out etiologies such as factor V Leiden or antithrombin III deficiency. These have all been negative. He is not had a recurrence of the rectal bleeding since that time. She tolerated her prep well. She takes no aspirin or blood thinning agent with the exception of 81 mg aspirin. She has held that for at least a couple of days. She did have a knee replacement about a year ago. I discussed antibiotic with her and feel that we will cover her with some clindamycin today.  She tolerated her prep well.  Current Facility-Administered Medications:  .  0.9 %  sodium chloride infusion, , Intravenous, Continuous, Christena DeemSkulskie, Harlei Lehrmann U, MD, Last Rate: 20 mL/hr at 01/18/17 0832, 1,000 mL at 01/18/17 0832 .  0.9 %  sodium chloride infusion, , Intravenous, Continuous, Christena DeemSkulskie, Vasco Chong U, MD .  0.9 %  sodium chloride infusion, , Intravenous, Continuous, Christena DeemSkulskie, Sanyla Summey U, MD .  clindamycin (CLEOCIN) 600 MG/50ML IVPB, , , ,  .  clindamycin (CLEOCIN) IVPB 600 mg, 600 mg, Intravenous, Once, Christena DeemSkulskie, Douglas Rooks U, MD, Last Rate: 100 mL/hr at 01/18/17 0851, 600 mg at 01/18/17 [id number]  Prescriptions Prior to Admission  Medication Sig Dispense Refill Last Dose  . aspirin 81 MG tablet Take 1 tablet (81 mg total) by mouth daily. 30 tablet 0 Past Week at Unknown time  . atorvastatin (LIPITOR) 40 MG tablet Take 40 mg by mouth at bedtime.    Past Week at Unknown time  . ciprofloxacin (CIPRO) 500 MG tablet Take 1 tablet (500 mg total) by mouth 2 (two) times daily. X 8 days 16  tablet 0 Past Week at Unknown time  . dicyclomine (BENTYL) 10 MG capsule Take 1 capsule (10 mg total) by mouth 3 (three) times daily before meals. X 10 days 30 capsule 0 Past Week at Unknown time  . DULoxetine (CYMBALTA) 60 MG capsule Take 60 mg by mouth at bedtime.    Past Week at Unknown time  . enalapril (VASOTEC) 2.5 MG tablet Take 2.5 mg by mouth 2 (two) times daily.   01/18/2017 at 0720  . fluticasone (FLONASE) 50 MCG/ACT nasal spray Place 1-2 sprays into both nostrils daily as needed for allergies.    Past Week at Unknown time  . gabapentin (NEURONTIN) 300 MG capsule Take 600 mg by mouth at bedtime.   Past Week at Unknown time  . glimepiride (AMARYL) 2 MG tablet Take 1 tablet (2 mg total) by mouth 2 (two) times daily. 60 tablet 0 Past Week at Unknown time  . levothyroxine (SYNTHROID, LEVOTHROID) 75 MCG tablet Take 75 mcg by mouth daily before breakfast.   Past Week at Unknown time  . metFORMIN (GLUCOPHAGE) 1000 MG tablet Take 1,000 mg by mouth 2 (two) times daily with a meal.   Past Week at Unknown time  . metroNIDAZOLE (FLAGYL) 500 MG tablet Take 1 tablet (500 mg total) by mouth every 8 (eight) hours. X 8 days 24 tablet 0 Past Week at Unknown time  . pantoprazole (PROTONIX) 40 MG tablet Take 40 mg by mouth 2 (two)  times daily.   Past Week at Unknown time  . Polyethyl Glycol-Propyl Glycol (SYSTANE) 0.4-0.3 % SOLN Place 1 drop into both eyes 3 (three) times daily as needed (for dry eyes).   Past Week at Unknown time  . Saccharomyces boulardii (PROBIOTIC) 250 MG CAPS Take 1 capsule by mouth daily. X 2 weeks 14 capsule 0 Past Week at Unknown time  . traMADol (ULTRAM) 50 MG tablet Take 1 tablet (50 mg total) by mouth every 6 (six) hours as needed for moderate pain or severe pain. 20 tablet 0 Past Week at Unknown time  . amLODipine (NORVASC) 5 MG tablet Take 5 mg by mouth every evening.   09/30/2016 at pm     Allergies  Allergen Reactions  . Morphine And Related Nausea And Vomiting    Severe n  & v. Any other pain medications are okay  . Penicillins Hives    Has patient had a PCN reaction causing immediate rash, facial/tongue/throat swelling, SOB or lightheadedness with hypotension: No Has patient had a PCN reaction causing severe rash involving mucus membranes or skin necrosis: No Has patient had a PCN reaction that required hospitalization No Has patient had a PCN reaction occurring within the last 10 years: No If all of the above answers are "NO", then may proceed with Cephalosporin use.      Past Medical History:  Diagnosis Date  . Anxiety   . Arthritis   . Asthma    when young  . Coronary artery disease   . Depression   . Diabetes mellitus without complication (HCC)   . Environmental and seasonal allergies   . GERD (gastroesophageal reflux disease)   . History of hiatal hernia   . Hypertension   . Hypothyroidism   . Lower extremity edema   . Motion sickness   . Scoliosis   . Shortness of breath dyspnea    doe    Review of systems:      Physical Exam    Heart and lungs: Regular rate and rhythm without rub or gallop, lungs are bilaterally clear.    HEENT: Normocephalic atraumatic eyes are anicteric    Other:     Pertinant exam for procedure: Soft nontender nondistended bowel sounds positive normoactive. There is a scar noted in lower abdomen consistent with previous surgical history. I did not hear any bruit or adventitious sounds.    Planned proceedures: Colonoscopy and indicated procedures. I have discussed the risks benefits and complications of procedures to include not limited to bleeding, infection, perforation and the risk of sedation and the patient wishes to proceed.    Christena Deem, MD Gastroenterology 01/18/2017  8:58 AM

## 2017-01-18 NOTE — Anesthesia Postprocedure Evaluation (Signed)
Anesthesia Post Note  Patient: Alexandria Marquez  Procedure(s) Performed: COLONOSCOPY WITH PROPOFOL (N/A )  Patient location during evaluation: PACU Anesthesia Type: General Level of consciousness: awake Pain management: pain level controlled Respiratory status: nonlabored ventilation Cardiovascular status: stable Anesthetic complications: no     Last Vitals:  Vitals:   01/18/17 0959 01/18/17 1009  BP: (!) 160/77 (!) 168/75  Pulse: 64 64  Resp: 18 12  Temp:    SpO2: 99% 99%    Last Pain:  Vitals:   01/18/17 0939  TempSrc: Tympanic                 VAN STAVEREN,Husam Hohn

## 2017-01-18 NOTE — Transfer of Care (Signed)
Immediate Anesthesia Transfer of Care Note  Patient: Micheline MazeJudy W Pannone  Procedure(s) Performed: COLONOSCOPY WITH PROPOFOL (N/A )  Patient Location: PACU and Endoscopy Unit  Anesthesia Type:General  Level of Consciousness: awake, alert , oriented and patient cooperative  Airway & Oxygen Therapy: Patient Spontanous Breathing and Patient connected to nasal cannula oxygen  Post-op Assessment: Report given to RN, Post -op Vital signs reviewed and stable and Patient moving all extremities  Post vital signs: Reviewed and stable  Last Vitals:  Vitals:   01/18/17 0814 01/18/17 0939  BP: (!) 197/77 131/70  Pulse: 70 62  Resp: 16 18  Temp: 36.5 C (!) 36.3 C  SpO2: 99% 100%    Last Pain:  Vitals:   01/18/17 0939  TempSrc: Tympanic         Complications: No apparent anesthesia complications

## 2017-01-18 NOTE — Anesthesia Preprocedure Evaluation (Signed)
Anesthesia Evaluation  Patient identified by MRN, date of birth, ID band Patient awake    Reviewed: Allergy & Precautions, NPO status , Patient's Chart, lab work & pertinent test results  Airway Mallampati: II       Dental  (+) Teeth Intact   Pulmonary shortness of breath, asthma ,     + decreased breath sounds      Cardiovascular Exercise Tolerance: Good hypertension, Pt. on medications + CAD   Rhythm:Regular Rate:Normal     Neuro/Psych Anxiety Depression    GI/Hepatic Neg liver ROS, hiatal hernia, GERD  Medicated,  Endo/Other  diabetes, Type 2, Oral Hypoglycemic AgentsHypothyroidism Morbid obesity  Renal/GU negative Renal ROS     Musculoskeletal   Abdominal (+) + obese,   Peds  Hematology   Anesthesia Other Findings   Reproductive/Obstetrics                             Anesthesia Physical Anesthesia Plan  ASA: II  Anesthesia Plan: General   Post-op Pain Management:    Induction: Intravenous  PONV Risk Score and Plan: 0  Airway Management Planned: Natural Airway and Nasal Cannula  Additional Equipment:   Intra-op Plan:   Post-operative Plan:   Informed Consent: I have reviewed the patients History and Physical, chart, labs and discussed the procedure including the risks, benefits and alternatives for the proposed anesthesia with the patient or authorized representative who has indicated his/her understanding and acceptance.     Plan Discussed with: CRNA  Anesthesia Plan Comments:         Anesthesia Quick Evaluation

## 2017-01-19 LAB — SURGICAL PATHOLOGY

## 2017-01-20 ENCOUNTER — Encounter: Payer: Self-pay | Admitting: Gastroenterology

## 2017-01-23 DIAGNOSIS — E875 Hyperkalemia: Secondary | ICD-10-CM | POA: Insufficient documentation

## 2017-02-02 ENCOUNTER — Encounter (INDEPENDENT_AMBULATORY_CARE_PROVIDER_SITE_OTHER): Payer: Self-pay | Admitting: Vascular Surgery

## 2017-02-02 ENCOUNTER — Ambulatory Visit (INDEPENDENT_AMBULATORY_CARE_PROVIDER_SITE_OTHER): Payer: Medicare Other | Admitting: Vascular Surgery

## 2017-02-02 VITALS — BP 151/78 | HR 62 | Resp 16 | Ht 60.0 in | Wt 174.0 lb

## 2017-02-02 DIAGNOSIS — E785 Hyperlipidemia, unspecified: Secondary | ICD-10-CM

## 2017-02-02 DIAGNOSIS — I10 Essential (primary) hypertension: Secondary | ICD-10-CM

## 2017-02-02 DIAGNOSIS — K529 Noninfective gastroenteritis and colitis, unspecified: Secondary | ICD-10-CM | POA: Diagnosis not present

## 2017-02-02 DIAGNOSIS — E119 Type 2 diabetes mellitus without complications: Secondary | ICD-10-CM | POA: Diagnosis not present

## 2017-02-02 NOTE — Assessment & Plan Note (Signed)
lipid control important in reducing the progression of atherosclerotic disease. Continue statin therapy  

## 2017-02-02 NOTE — Assessment & Plan Note (Signed)
The patient has colitis which is concerning for ischemic colitis as its cause.  We had a long discussion today regarding the pathophysiology and natural history of ischemic colitis.  We discussed that the majority have small vessel disease that is not amenable to revascularization, but not insignificant majority may have large vessel SMA or IMA disease that would benefit from revascularization.  I have recommended a mesenteric duplex to be performed at her convenience.  I will see the patient back following this duplex to discuss the results and determine further treatment options.

## 2017-02-02 NOTE — Assessment & Plan Note (Signed)
blood pressure control important in reducing the progression of atherosclerotic disease. On appropriate oral medications.  

## 2017-02-02 NOTE — Progress Notes (Signed)
Patient ID: Alexandria Marquez, female   DOB: [birth year], 71 y.o.   MRN: [id number]  Chief Complaint  Patient presents with  . New Patient (Initial Visit)    Clonic Ischemia (probable), Aortic vascular disease, mesenteric and celiac disease    HPI SALSABEEL Marquez is a 71 y.o. female.  I am asked to see the patient by Dr. Marva Panda for evaluation of ischemic colitis.  The patient reports about 4 months ago having an episode of abdominal pain with bleeding per rectum.  She was diagnosed with ischemic colitis and had a CT scan which I have reviewed recently.  She does have some aortic atherosclerosis what appears to be atherosclerosis of the celiac artery at the least.  This was not a CT angiogram, so it is difficult to give precise information regarding her visceral vessels.  She is still having issues with some diarrhea and intermittent abdominal pain although not as severe as before.  She reports no further bleeding.  She has undergone a colonoscopy which confirmed what appeared to be left-sided colitis consistent with ischemic colitis.   Past Medical History:  Diagnosis Date  . Anxiety   . Arthritis   . Asthma    when young  . Coronary artery disease   . Depression   . Diabetes mellitus without complication (HCC)   . Environmental and seasonal allergies   . GERD (gastroesophageal reflux disease)   . History of hiatal hernia   . Hypertension   . Hypothyroidism   . Lower extremity edema   . Motion sickness   . Scoliosis   . Shortness of breath dyspnea    doe    Past Surgical History:  Procedure Laterality Date  . ABDOMINAL HYSTERECTOMY    . APPENDECTOMY    . BACK SURGERY  2014   lumbar fusion/plate/rods/screws  . CHOLECYSTECTOMY  2005  . CORONARY ARTERY BYPASS GRAFT     2008 2 vessels  . CORONARY ARTERY BYPASS GRAFT  2008  . HERNIA REPAIR  1987   incisional  . JOINT REPLACEMENT Right 2017   knee  . rcr    . ROTATOR CUFF REPAIR Left 2013  . TONSILLECTOMY    . TUBAL LIGATION       Family History  Problem Relation Age of Onset  . Breast cancer Maternal Aunt 30  . Heart disease Mother   . Heart attack Mother   . Heart disease Father   . Heart attack Father   . Diabetes Father      Social History Social History   Tobacco Use  . Smoking status: Never Smoker  . Smokeless tobacco: Never Used  Substance Use Topics  . Alcohol use: No  . Drug use: No     Allergies  Allergen Reactions  . Morphine And Related Nausea And Vomiting    Severe n & v. Any other pain medications are okay  . Penicillins Hives    Has patient had a PCN reaction causing immediate rash, facial/tongue/throat swelling, SOB or lightheadedness with hypotension: No Has patient had a PCN reaction causing severe rash involving mucus membranes or skin necrosis: No Has patient had a PCN reaction that required hospitalization No Has patient had a PCN reaction occurring within the last 10 years: No If all of the above answers are "NO", then may proceed with Cephalosporin use.     Current Outpatient Medications  Medication Sig Dispense Refill  . amLODipine (NORVASC) 5 MG tablet Take 5 mg by mouth every evening.    Marland Kitchen  aspirin 81 MG tablet Take 1 tablet (81 mg total) by mouth daily. 30 tablet 0  . atorvastatin (LIPITOR) 40 MG tablet Take 40 mg by mouth at bedtime.     . DULoxetine (CYMBALTA) 60 MG capsule Take 60 mg by mouth at bedtime.     . enalapril (VASOTEC) 2.5 MG tablet Take 2.5 mg by mouth 2 (two) times daily.    . fluticasone (FLONASE) 50 MCG/ACT nasal spray Place 1-2 sprays into both nostrils daily as needed for allergies.     Marland Kitchen. gabapentin (NEURONTIN) 300 MG capsule Take 600 mg by mouth at bedtime.    Marland Kitchen. glimepiride (AMARYL) 2 MG tablet Take 1 tablet (2 mg total) by mouth 2 (two) times daily. 60 tablet 0  . levothyroxine (SYNTHROID, LEVOTHROID) 75 MCG tablet Take 75 mcg by mouth daily before breakfast.    . metFORMIN (GLUCOPHAGE) 1000 MG tablet Take 1,000 mg by mouth 2 (two) times daily  with a meal.    . metroNIDAZOLE (FLAGYL) 500 MG tablet Take 1 tablet (500 mg total) by mouth every 8 (eight) hours. X 8 days 24 tablet 0  . pantoprazole (PROTONIX) 40 MG tablet Take 40 mg by mouth 2 (two) times daily.    Bertram Gala. Polyethyl Glycol-Propyl Glycol (SYSTANE) 0.4-0.3 % SOLN Place 1 drop into both eyes 3 (three) times daily as needed (for dry eyes).    . Saccharomyces boulardii (PROBIOTIC) 250 MG CAPS Take 1 capsule by mouth daily. X 2 weeks 14 capsule 0  . ciprofloxacin (CIPRO) 500 MG tablet Take 1 tablet (500 mg total) by mouth 2 (two) times daily. X 8 days (Patient not taking: Reported on 02/02/2017) 16 tablet 0  . dicyclomine (BENTYL) 10 MG capsule Take 1 capsule (10 mg total) by mouth 3 (three) times daily before meals. X 10 days (Patient not taking: Reported on 02/02/2017) 30 capsule 0  . traMADol (ULTRAM) 50 MG tablet Take 1 tablet (50 mg total) by mouth every 6 (six) hours as needed for moderate pain or severe pain. (Patient not taking: Reported on 02/02/2017) 20 tablet 0   No current facility-administered medications for this visit.       REVIEW OF SYSTEMS (Negative unless checked)  Constitutional: [] Weight loss  [] Fever  [] Chills Cardiac: [] Chest pain   [] Chest pressure   [] Palpitations   [] Shortness of breath when laying flat   [] Shortness of breath at rest   [x] Shortness of breath with exertion. Vascular:  [] Pain in legs with walking   [] Pain in legs at rest   [] Pain in legs when laying flat   [] Claudication   [] Pain in feet when walking  [] Pain in feet at rest  [] Pain in feet when laying flat   [] History of DVT   [] Phlebitis   [] Swelling in legs   [] Varicose veins   [] Non-healing ulcers Pulmonary:   [] Uses home oxygen   [] Productive cough   [] Hemoptysis   [] Wheeze  [] COPD   [x] Asthma Neurologic:  [] Dizziness  [] Blackouts   [] Seizures   [] History of stroke   [] History of TIA  [] Aphasia   [] Temporary blindness   [] Dysphagia   [] Weakness or numbness in arms   [] Weakness or numbness  in legs Musculoskeletal:  [x] Arthritis   [] Joint swelling   [x] Joint pain   [] Low back pain Hematologic:  [] Easy bruising  [] Easy bleeding   [] Hypercoagulable state   [] Anemic  [] Hepatitis Gastrointestinal:  [x] Blood in stool   [] Vomiting blood  [x] Gastroesophageal reflux/heartburn   [] Abdominal pain Genitourinary:  [] Chronic kidney disease   []   Difficult urination  [] Frequent urination  [] Burning with urination   [] Hematuria Skin:  [] Rashes   [] Ulcers   [] Wounds Psychological:  [x] History of anxiety   []  History of major depression.    Physical Exam BP (!) 151/78 (BP Location: Right Arm)   Pulse 62   Resp 16   Ht 5' (1.524 m)   Wt 174 lb (78.9 kg)   BMI 33.98 kg/m  Gen:  WD/WN, NAD Head: [place]/AT, No temporalis wasting.  Ear/Nose/Throat: Hearing grossly intact, nares w/o erythema or drainage, oropharynx w/o Erythema/Exudate Eyes: Conjunctiva clear, sclera non-icteric  Neck: trachea midline.  No bruit or JVD.  Pulmonary:  Good air movement, clear to auscultation bilaterally.  Cardiac: RRR, normal S1, S2 Vascular:  Vessel Right Left  Radial Palpable Palpable                                   Gastrointestinal: soft, non-tender/non-distended. No guarding/reflex. Musculoskeletal: M/S 5/5 throughout.  Extremities without ischemic changes.  No deformity or atrophy. no edema. Neurologic: Sensation grossly intact in extremities.  Symmetrical.  Speech is fluent. Motor exam as listed above. Psychiatric: Judgment intact, Mood & affect appropriate for pt's clinical situation. Dermatologic: No rashes or ulcers noted.  No cellulitis or open wounds.    Radiology No results found.  Labs Recent Results (from the past 2160 hour(s))  Glucose, capillary     Status: Abnormal   Collection Time: 01/18/17  8:21 AM  Result Value Ref Range   Glucose-Capillary 192 (H) 65 - 99 mg/dL   Comment 1 IN EPIC   Surgical pathology     Status: None   Collection Time: 01/18/17  9:23 AM  Result Value  Ref Range   SURGICAL PATHOLOGY      Surgical Pathology CASE: [id number] PATIENT: Chales AbrahamsJUDY Mabe Surgical Pathology Report     SPECIMEN SUBMITTED: A. Colon, right; cbx B. Colon, left; cbx C. Rectum; cbx  CLINICAL HISTORY: None provided  PRE-OPERATIVE DIAGNOSIS: Colitis, abnormal abdominal CT scan  POST-OPERATIVE DIAGNOSIS: Diverticulosis     DIAGNOSIS: A. COLON, RIGHT; COLD BIOPSY: - COLONIC MUCOSA WITH FOCAL LYMPHOID AGGREGATE. - NEGATIVE FOR COLITIS, DYSPLASIA, AND MALIGNANCY.  B. COLON, LEFT; COLD BIOPSY: - COLONIC MUCOSA WITH FOCAL LYMPHOID AGGREGATE. - NEGATIVE FOR COLITIS, DYSPLASIA, AND MALIGNANCY.  C. RECTUM; COLD BIOPSY: - COLONIC MUCOSA WITH PROMINENT LYMPHOID AGGREGATES AND FOCAL MODERATE ACTIVE COLITIS. - NEGATIVE FOR VIRAL CYTOPATHIC EFFECT, DYSPLASIA, AND MALIGNANCY.  Comment: One of the two rectum biopsies (specimen C) there is a focal area of moderate active colitis. Architectural features of chronicity are not identified.   GROSS DESCRIPTION:  A. Labeled: C BX  right colon  Tissue fragment(s): 2  Size: 0.3 and 0.5 cm  Description: pink fragments  Entirely submitted in one cassette(s).   B. Labeled: C BX left colon  Tissue fragment(s): 2  Size: 0.3 and 0.4 cm  Description: tan fragments  Entirely submitted in 1 cassette(s).  C. Labeled: C BX rectum  Tissue fragment(s): 2  Size: 0.3 and 0.6 cm  Description: pink fragments  Entirely submitted in one cassette(s).  Final Diagnosis performed by Elijah Birkara Rubinas, MD.  Electronically signed 01/19/2017 10:32:51AM    The electronic signature indicates that the named Attending Pathologist has evaluated the specimen  Technical component performed at [facility], [address], [place], [place] [address] Lab: [phone] Dir: Titus DubinWilliam F. Cato MulliganHancock, MD  Professional component performed at [facility], [facility], [address]  [place], [place], [place] [address] Lab:  [phone] Dir: Georgiann Cocker. Rubinas, MD      Assessment/Plan:  HTN (hypertension) blood pressure control important in reducing the progression of atherosclerotic disease. On appropriate oral medications.   Diabetes (HCC) blood glucose control important in reducing the progression of atherosclerotic disease. Also, involved in wound healing. On appropriate medications.   HLD (hyperlipidemia) lipid control important in reducing the progression of atherosclerotic disease. Continue statin therapy   Colitis The patient has colitis which is concerning for ischemic colitis as its cause.  We had a long discussion today regarding the pathophysiology and natural history of ischemic colitis.  We discussed that the majority have small vessel disease that is not amenable to revascularization, but not insignificant majority may have large vessel SMA or IMA disease that would benefit from revascularization.  I have recommended a mesenteric duplex to be performed at her convenience.  I will see the patient back following this duplex to discuss the results and determine further treatment options.      Festus Barren 02/02/2017, 1:01 PM   This note was created with Dragon medical transcription system.  Any errors from dictation are unintentional.

## 2017-02-02 NOTE — Patient Instructions (Signed)
Ischemic Colitis  Ischemic colitis is damage to the large intestine due to reduced blood flow (ischemia) to the colon. The colon is the last section of the large intestine, where stool is formed. The reduced blood flow may lead to the death of cells (necrosis) in the lining of the colon, damaging the colon and often causing bleeding.  Most cases of ischemic colitis clear up in a few days with treatment. In other cases, blood flow does not improve, and parts of the colon start to die. This is extremely serious and even life-threatening. If this happens, surgery may be required. In some cases, parts of the colon may need to be removed.  What are the causes?  Ischemic colitis results from a decrease in the blood supply to the colon. Many conditions can cause this, such as:   Heart problems that reduce blood flow to the arteries that supply the colon. These include problems such as coronary heart disease, peripheral vascular disease, atrial fibrillation, and congestive heart failure.   Low blood pressure from:  ? An infection that spreads to the blood (sepsis).  ? Dehydration or bleeding (shock).   Drugs that narrow blood vessels (vasoconstrictors).    Sometimes the cause is not known.  What increases the risk?  You are more likely to develop this condition if:   You are 60 years of age or older.   You are female.   You have another medical condition, such as:  ? Heart disease.  ? Diabetes.  ? Kidney disease that requires you to be on dialysis.  ? A disease that causes blood clots.   You are frequently constipated.   You have had surgery on the heart, blood vessels (such as the aorta), or colon.   You take certain medicines or drugs, such as:  ? Medicines that suppress your immune system (immunomodulators).  ? Medicines that cause constipation.  ? Illegal drugs, such as cocaine or methamphetamines.   You get an extreme amount of exercise from long-distance bike riding or running.    What are the signs or  symptoms?  Symptoms of this condition start suddenly and may include:   Dull pain, usually on the left side of the abdomen.   Tenderness of the abdomen.   Abdomen (abdominal) cramps.   An urgent need to have a bowel movement.   Loose, bloody stools with clots of dark or bright red blood.   Nausea and vomiting.   Fever.   Weakness, fatigue, and confusion.    How is this diagnosed?  This condition may be diagnosed based on:   Your symptoms, your medical history, and a physical exam.   Tests to find out more about your condition and to rule out other causes of pain and bleeding. These tests may include:  ? Blood tests to check for clotting, blood loss, and low proteins in your blood.  ? CT scan of the colon.  ? A procedure to examine the inside of your colon using a scope that is passed through the rectum (colonoscopy). Colonoscopy is the most important diagnostic test. During this test, your health care provider may take a small piece of tissue from your colon to be examined under a microscope (biopsy).    How is this treated?  You may be hospitalized for treatment. Treatment usually includes:   Not eating or drinking anything. This allows the colon to rest.   IV fluids to maintain blood pressure, regulate blood minerals (electrolytes), and provide nutrition.     Having a tube inserted into your stomach through your nose (nasogastric tube) to drain your stomach.   IV antibiotic medicines. These may be used if an infection is suspected.   Stopping or changing medicines that may be causing the condition.    You may need surgery if your condition is severe or if it gets worse or does not get better after a few days. Parts of the colon that will not recover may need to be removed. In some cases, a procedure is also done to attach the healthy part of the colon to the outer wall of the abdomen to drain stool (colostomy).  Follow these instructions at home:   Follow instructions from your health care provider  about eating or drinking restrictions.   Drink enough fluid to keep your urine clear or pale yellow.   Take over-the-counter and prescription medicines only as told by your health care provider.   Return to your normal activities as told by your health care provider. Ask your health care provider what activities are safe for you.   Do not use any products that contain nicotine or tobacco, such as cigarettes and e-cigarettes. If you need help quitting, ask your health care provider.   Keep all follow-up visits as told by your health care provider. This is important.  Contact a health care provider if:   You have blood in your stool.   You have abdominal pain or cramps.   You have constipation.   You have nausea or vomiting.  Get help right away if:   You have a moderate to large amount of loose, bloody stools with clots of dark or bright red blood.   You have severe abdominal pain.   Your abdominal pain has not improved after 24 hours.   You have a fever.   You have not been able to have a bowel movement, and you are in pain and vomiting.   You have shortness of breath.   You are very tired (lethargic) or have confusion.  Summary   Ischemic colitis is damage to the large intestine due to reduced blood flow (ischemia) to the colon.   Some of the symptoms of this condition include abdominal pain or tenderness, bloody stools, and an urgent need to have a bowel movement.   Diagnosis usually includes a procedure to examine the inside of the colon using a scope that is passed through the rectum (colonoscopy).  This information is not intended to replace advice given to you by your health care provider. Make sure you discuss any questions you have with your health care provider.  Document Released: 04/27/2016 Document Revised: 04/27/2016 Document Reviewed: 04/27/2016  Elsevier Interactive Patient Education  2018 [employer]

## 2017-02-02 NOTE — Assessment & Plan Note (Signed)
blood glucose control important in reducing the progression of atherosclerotic disease. Also, involved in wound healing. On appropriate medications.  

## 2017-03-26 ENCOUNTER — Encounter (INDEPENDENT_AMBULATORY_CARE_PROVIDER_SITE_OTHER): Payer: Medicare Other

## 2017-03-26 ENCOUNTER — Ambulatory Visit (INDEPENDENT_AMBULATORY_CARE_PROVIDER_SITE_OTHER): Payer: No Typology Code available for payment source | Admitting: Vascular Surgery

## 2017-05-01 DIAGNOSIS — R42 Dizziness and giddiness: Secondary | ICD-10-CM | POA: Insufficient documentation

## 2017-06-01 IMAGING — DX DG KNEE 1-2V PORT*R*
2 series · 2 of 2 positions shown · non-contrast
Comparison: None.

CLINICAL DATA: Status post right total knee arthroplasty.

EXAM:
PORTABLE RIGHT KNEE - 1-2 VIEW

[knee ap]
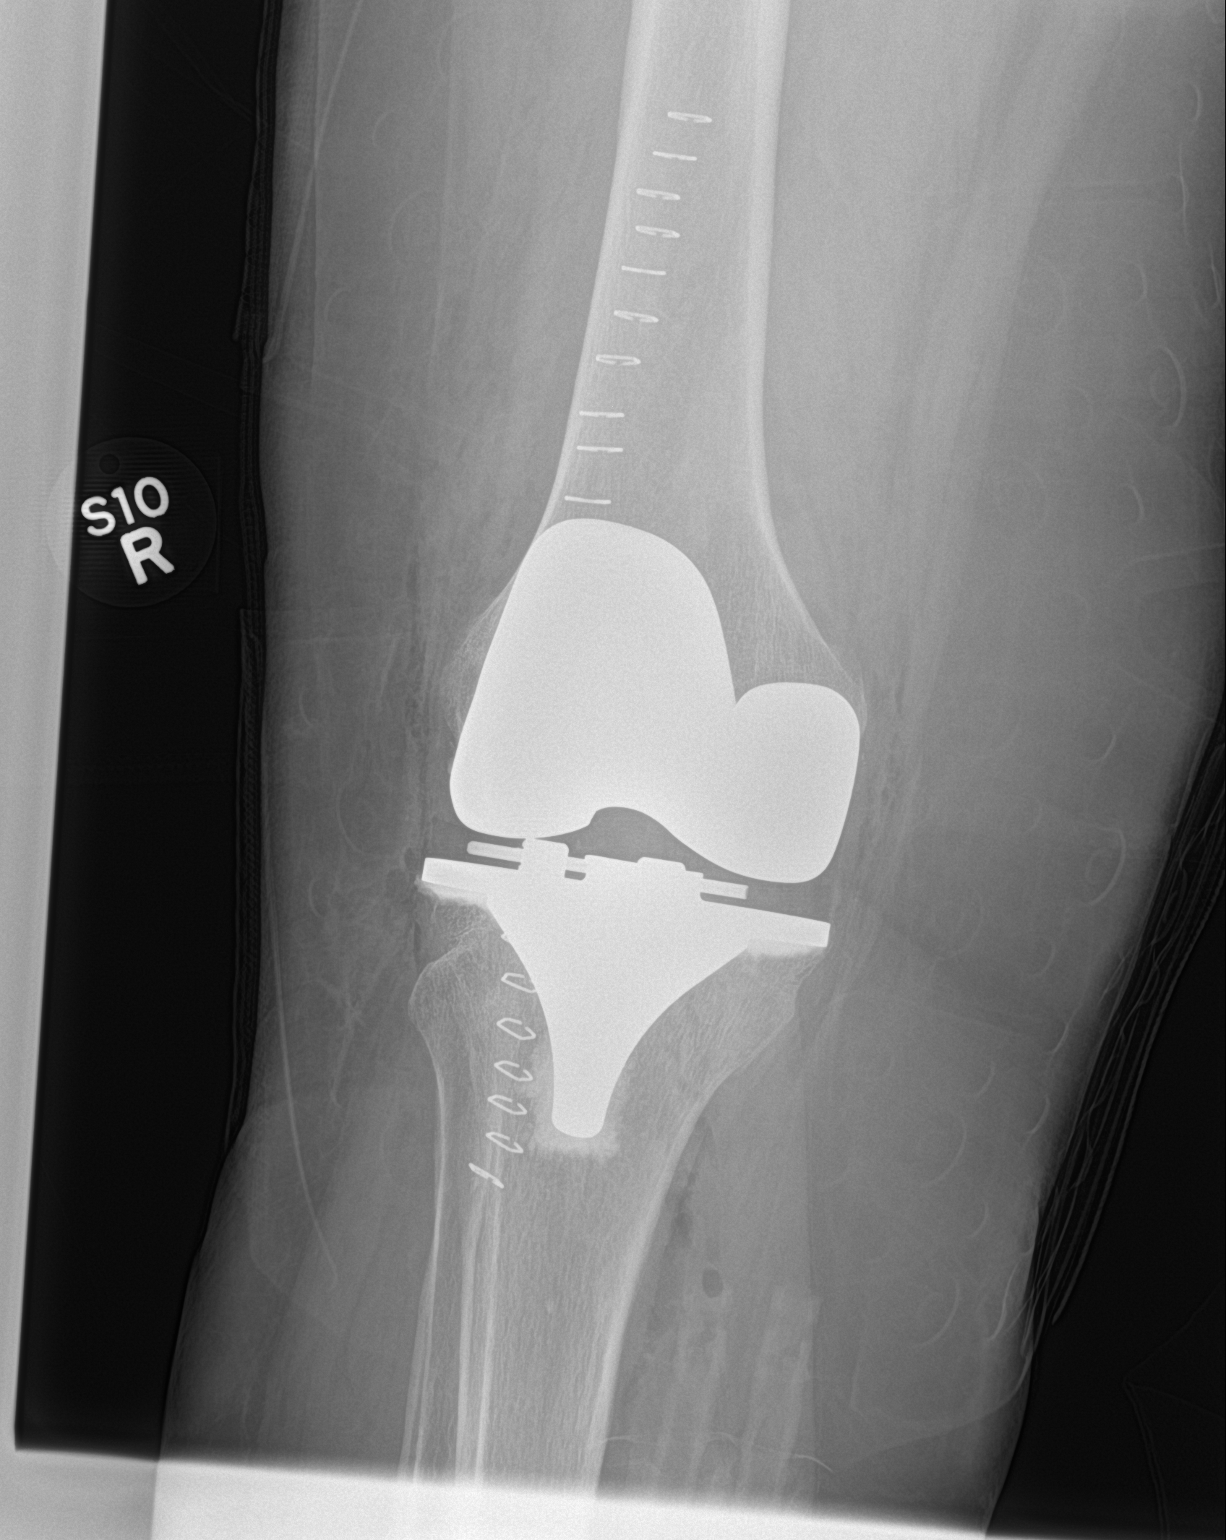

[knee lat]
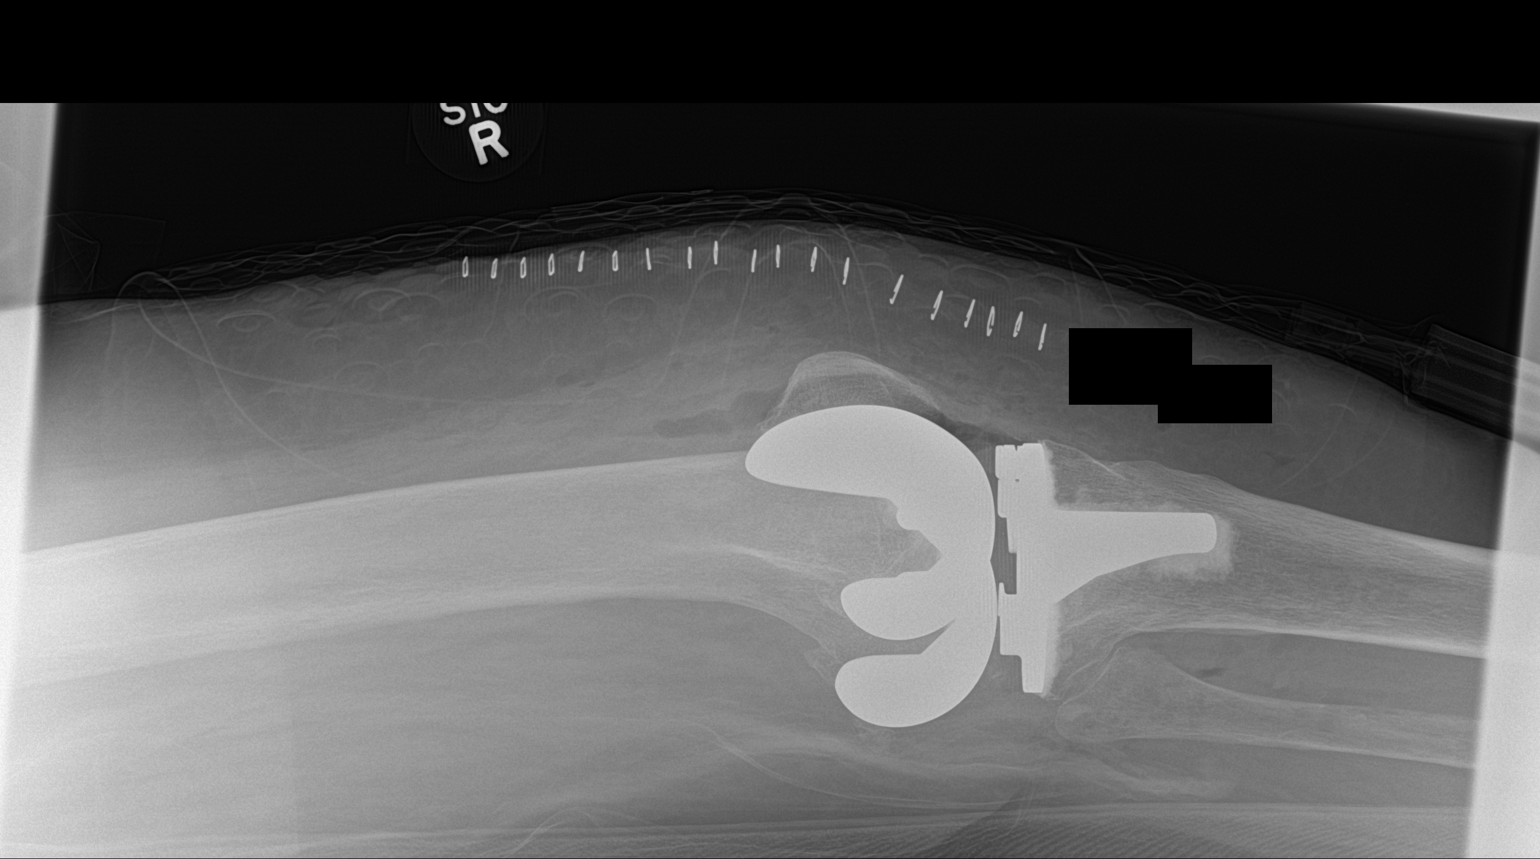

[2 of 2 positions shown; findings below may reference images not displayed]

FINDINGS: Sequelae of recent right total knee arthroplasty are identified.
There is no evidence of acute fracture or dislocation. A surgical
drain, skin staples, and postoperative soft tissue gas and edema are
noted.
IMPRESSION: Right total knee arthroplasty without evidence of immediate
complication.

## 2017-11-11 DIAGNOSIS — E119 Type 2 diabetes mellitus without complications: Secondary | ICD-10-CM | POA: Insufficient documentation

## 2017-12-11 ENCOUNTER — Emergency Department
Admission: EM | Admit: 2017-12-11 | Discharge: 2017-12-11 | Disposition: A | Discharge status: Home / Self Care | Payer: Medicare Other | Attending: Student in an Organized Health Care Education/Training Program | Admitting: Student in an Organized Health Care Education/Training Program

## 2017-12-11 ENCOUNTER — Encounter: Payer: Self-pay | Admitting: Emergency Medicine

## 2017-12-11 DIAGNOSIS — Z96651 Presence of right artificial knee joint: Secondary | ICD-10-CM | POA: Insufficient documentation

## 2017-12-11 DIAGNOSIS — I2510 Atherosclerotic heart disease of native coronary artery without angina pectoris: Secondary | ICD-10-CM | POA: Insufficient documentation

## 2017-12-11 DIAGNOSIS — E039 Hypothyroidism, unspecified: Secondary | ICD-10-CM | POA: Diagnosis not present

## 2017-12-11 DIAGNOSIS — J45909 Unspecified asthma, uncomplicated: Secondary | ICD-10-CM | POA: Diagnosis not present

## 2017-12-11 DIAGNOSIS — R0602 Shortness of breath: Secondary | ICD-10-CM | POA: Diagnosis not present

## 2017-12-11 DIAGNOSIS — T782XXA Anaphylactic shock, unspecified, initial encounter: Secondary | ICD-10-CM | POA: Insufficient documentation

## 2017-12-11 DIAGNOSIS — I10 Essential (primary) hypertension: Secondary | ICD-10-CM | POA: Insufficient documentation

## 2017-12-11 DIAGNOSIS — Z7982 Long term (current) use of aspirin: Secondary | ICD-10-CM | POA: Insufficient documentation

## 2017-12-11 DIAGNOSIS — Z7984 Long term (current) use of oral hypoglycemic drugs: Secondary | ICD-10-CM | POA: Insufficient documentation

## 2017-12-11 DIAGNOSIS — E119 Type 2 diabetes mellitus without complications: Secondary | ICD-10-CM | POA: Insufficient documentation

## 2017-12-11 DIAGNOSIS — Z951 Presence of aortocoronary bypass graft: Secondary | ICD-10-CM | POA: Insufficient documentation

## 2017-12-11 DIAGNOSIS — Z79899 Other long term (current) drug therapy: Secondary | ICD-10-CM | POA: Insufficient documentation

## 2017-12-11 DIAGNOSIS — T7840XA Allergy, unspecified, initial encounter: Secondary | ICD-10-CM | POA: Diagnosis present

## 2017-12-11 MED ORDER — ONDANSETRON HCL 4 MG/2ML IJ SOLN
4.0000 mg | Freq: Once | INTRAMUSCULAR | Status: AC
  Administered 2017-12-11: 4 mg via INTRAVENOUS

## 2017-12-11 MED ORDER — EPINEPHRINE 0.3 MG/0.3ML IJ SOAJ: 0.3000 mg | Freq: Once | INTRAMUSCULAR | 0 refills | Status: AC

## 2017-12-11 MED ORDER — METHYLPREDNISOLONE SODIUM SUCC 125 MG IJ SOLR
125.0000 mg | Freq: Once | INTRAMUSCULAR | Status: AC
  Administered 2017-12-11: 125 mg via INTRAVENOUS
  Filled 2017-12-11: qty 2

## 2017-12-11 MED ORDER — ONDANSETRON HCL 4 MG/2ML IJ SOLN
INTRAMUSCULAR | Status: AC
  Filled 2017-12-11: qty 2

## 2017-12-11 MED ORDER — PREDNISONE 20 MG PO TABS: 40.0000 mg | ORAL_TABLET | Freq: Every day | ORAL | 0 refills | Status: AC

## 2017-12-11 NOTE — ED Triage Notes (Addendum)
PT arrived after allergic reaction to unknown substance. PT states she took gas x and milk of magnesia but states she has taken them before with no issue. Pt was given 0.8 of IM epi, 20mg  pepcid, 50mg  of benadryl, and 200cc of NS. Pt arrives alert and oriented.Pt's tongue swollen on assessment

## 2017-12-11 NOTE — ED Provider Notes (Signed)
[facility] Emergency Department Provider Note    First MD Initiated Contact with Patient 12/11/17 1322     (approximate)  I have reviewed the triage vital signs and the nursing notes.   HISTORY  Chief Complaint Allergic Reaction    HPI Alexandria Marquez is a 72 y.o. female below listed past medical history presents to the ER for allergic reaction evaluation.  Patient states he was drinking the solution of Gas-X as well as milk of magnesia this afternoon when she started having burning sensation in her lips and tongue.  She called EMS that she felt she was also having difficulty breathing.  She was given IM epi, Pepcid, Benadryl and fluids and brought to the ER.  Does feel that her tongue swelling and symptoms are improving.  Denies any chest pain.  Denies any other ingestions.  Does not take any ACE inhibitors.    Past Medical History:  Diagnosis Date  . Anxiety   . Arthritis   . Asthma    when young  . Coronary artery disease   . Depression   . Diabetes mellitus without complication (HCC)   . Environmental and seasonal allergies   . GERD (gastroesophageal reflux disease)   . History of hiatal hernia   . Hypertension   . Hypothyroidism   . Lower extremity edema   . Motion sickness   . Scoliosis   . Shortness of breath dyspnea    doe   Family History  Problem Relation Age of Onset  . Breast cancer Maternal Aunt 30  . Heart disease Mother   . Heart attack Mother   . Heart disease Father   . Heart attack Father   . Diabetes Father    Past Surgical History:  Procedure Laterality Date  . ABDOMINAL HYSTERECTOMY    . APPENDECTOMY    . BACK SURGERY  2014   lumbar fusion/plate/rods/screws  . CHOLECYSTECTOMY  2005  . COLONOSCOPY WITH PROPOFOL N/A 01/18/2017   Procedure: COLONOSCOPY WITH PROPOFOL;  Surgeon: Christena DeemSkulskie, Martin U, MD;  [facility];  Service: Endoscopy;  Laterality: N/A;  . CORONARY ARTERY BYPASS GRAFT     2008 2 vessels    . CORONARY ARTERY BYPASS GRAFT  2008  . HERNIA REPAIR  1987   incisional  . JOINT REPLACEMENT Right 2017   knee  . ORIF ANKLE FRACTURE Left 06/19/2016   Procedure: OPEN REDUCTION INTERNAL FIXATION (ORIF) ANKLE FRACTURE;  Surgeon: Juanell FairlyKevin Krasinski, MD;  Location: [facility] ORS;  Service: Orthopedics;  Laterality: Left;  . rcr    . ROTATOR CUFF REPAIR Left 2013  . TONSILLECTOMY    . TOTAL KNEE ARTHROPLASTY Right 12/19/2015   Procedure: TOTAL KNEE ARTHROPLASTY;  Surgeon: Christena FlakeJohn J Poggi, MD;  Location: [facility] ORS;  Service: Orthopedics;  Laterality: Right;  . TUBAL LIGATION     Patient Active Problem List   Diagnosis Date Noted  . Rectal bleeding 10/01/2016  . Diabetes (HCC) 10/01/2016  . CAD (coronary artery disease) 10/01/2016  . HTN (hypertension) 10/01/2016  . HLD (hyperlipidemia) 10/01/2016  . Depression with anxiety 10/01/2016  . Hypothyroidism 10/01/2016  . GERD (gastroesophageal reflux disease) 10/01/2016  . Colitis 10/01/2016  . Ankle fracture, bimalleolar, closed, left, initial encounter 06/19/2016  . Enteritis 06/02/2016  . Status post total right knee replacement using cement 12/19/2015      Prior to Admission medications   Medication Sig Start Date End Date Taking? Authorizing Provider  amLODipine (NORVASC) 5 MG tablet Take 5 mg  by mouth every evening.    [provider]  aspirin 81 MG tablet Take 1 tablet (81 mg total) by mouth daily. 10/05/16   Enid Baas, MD  atorvastatin (LIPITOR) 40 MG tablet Take 40 mg by mouth at bedtime.  07/09/16   [provider]  ciprofloxacin (CIPRO) 500 MG tablet Take 1 tablet (500 mg total) by mouth 2 (two) times daily. X 8 days Patient not taking: Reported on 02/02/2017 10/05/16   Enid Baas, MD  dicyclomine (BENTYL) 10 MG capsule Take 1 capsule (10 mg total) by mouth 3 (three) times daily before meals. X 10 days Patient not taking: Reported on 02/02/2017 10/05/16   Enid Baas, MD  DULoxetine (CYMBALTA) 60  MG capsule Take 60 mg by mouth at bedtime.     [provider]  enalapril (VASOTEC) 2.5 MG tablet Take 2.5 mg by mouth 2 (two) times daily.    [provider]  EPINEPHrine 0.3 mg/0.3 mL IJ SOAJ injection Inject 0.3 mLs (0.3 mg total) into the muscle once for 1 dose. 12/11/17 12/11/17  Willy Eddy, MD  fluticasone (FLONASE) 50 MCG/ACT nasal spray Place 1-2 sprays into both nostrils daily as needed for allergies.     [provider]  gabapentin (NEURONTIN) 300 MG capsule Take 600 mg by mouth at bedtime.    [provider]  glimepiride (AMARYL) 2 MG tablet Take 1 tablet (2 mg total) by mouth 2 (two) times daily. 10/05/16   Enid Baas, MD  levothyroxine (SYNTHROID, LEVOTHROID) 75 MCG tablet Take 75 mcg by mouth daily before breakfast.    [provider]  metFORMIN (GLUCOPHAGE) 1000 MG tablet Take 1,000 mg by mouth 2 (two) times daily with a meal.    [provider]  metroNIDAZOLE (FLAGYL) 500 MG tablet Take 1 tablet (500 mg total) by mouth every 8 (eight) hours. X 8 days 10/05/16   Enid Baas, MD  pantoprazole (PROTONIX) 40 MG tablet Take 40 mg by mouth 2 (two) times daily.    [provider]  Polyethyl Glycol-Propyl Glycol (SYSTANE) 0.4-0.3 % SOLN Place 1 drop into both eyes 3 (three) times daily as needed (for dry eyes).    [provider]  predniSONE (DELTASONE) 20 MG tablet Take 2 tablets (40 mg total) by mouth daily for 5 days. 12/11/17 12/16/17  Willy Eddy, MD  Saccharomyces boulardii (PROBIOTIC) 250 MG CAPS Take 1 capsule by mouth daily. X 2 weeks 10/05/16   Enid Baas, MD  traMADol (ULTRAM) 50 MG tablet Take 1 tablet (50 mg total) by mouth every 6 (six) hours as needed for moderate pain or severe pain. Patient not taking: Reported on 02/02/2017 10/05/16   Enid Baas, MD    Allergies Morphine and related and Penicillins    Social History Social History   Tobacco Use  . Smoking  status: Never Smoker  . Smokeless tobacco: Never Used  Substance Use Topics  . Alcohol use: No  . Drug use: No    Review of Systems Patient denies headaches, rhinorrhea, blurry vision, numbness, shortness of breath, chest pain, edema, cough, abdominal pain, nausea, vomiting, diarrhea, dysuria, fevers, rashes or hallucinations unless otherwise stated above in HPI. ____________________________________________   PHYSICAL EXAM:  VITAL SIGNS: Vitals:   12/11/17 1330 12/11/17 1430  BP: (!) 152/65 (!) 130/54  Pulse: 81 69  Resp: 16 15  Temp:    SpO2: 100% 97%    Constitutional: Alert and oriented.  Eyes: Conjunctivae are normal.  Head: Atraumatic. Nose: No congestion/rhinnorhea. Mouth/Throat:  Mucous membranes are moist. + mild angioedema of the tongue, no uvular swelling Neck: No stridor. Painless ROM.  Cardiovascular: Normal rate, regular rhythm. Grossly normal heart sounds.  Good peripheral circulation. Respiratory: Normal respiratory effort.  No retractions. Lungs CTAB. Gastrointestinal: Soft and nontender. No distention. No abdominal bruits. No CVA tenderness. Genitourinary:  Musculoskeletal: No lower extremity tenderness nor edema.  No joint effusions. Neurologic:  Normal speech and language. No gross focal neurologic deficits are appreciated. No facial droop Skin:  Skin is warm, dry and intact. No rash noted. Psychiatric: Mood and affect are normal. Speech and behavior are normal.  ____________________________________________   LABS (all labs ordered are listed, but only abnormal results are displayed)  No results found for this or any previous visit (from the past 24 hour(s)). ____________________________________________  EKG My review and personal interpretation at Time: allergic reaction   Indication: allergic reaction  Rate: 90  Rhythm: sinus Axis: normal Other: normal intervals, no stemi ____________________________________________  RADIOLOGY  I personally  reviewed all radiographic images ordered to evaluate for the above acute complaints and reviewed radiology reports and findings.  These findings were personally discussed with the patient.  Please see medical record for radiology report.  ____________________________________________   PROCEDURES  Procedure(s) performed:  Procedures    Critical Care performed: no ____________________________________________   INITIAL IMPRESSION / ASSESSMENT AND PLAN / ED COURSE  Pertinent labs & imaging results that were available during my care of the patient were reviewed by me and considered in my medical decision making (see chart for details).   DDX: angioedema, anaphylaxis, anaphylactoid, glossitis  Alexandria Marquez is a 72 y.o. who presents to the ED with symptoms as described above.  Symptoms seem to be significantly improving after treatment for anaphylaxis initiated by EMS.  She is currently protecting her airway.  Does not seem clinically consistent with infectious glossitis Ludwig's angina.  No ACE inhibitor or arm to suggest angioedema edema induced by that medication.  Uncertain as to what precipitated this event but will observe patient in the ER.  We will give IV Solu-Medrol.  Clinical Course as of Dec 11 1532  Sat Dec 11, 2017  1420 Patient reassessed.  Symptoms improving.  Will continue to observe.   [PR]    Clinical Course User Index [PR] Willy Eddy, MD     As part of my medical decision making, I reviewed the following data within the electronic MEDICAL RECORD NUMBER Nursing notes reviewed and incorporated, Labs reviewed, notes from prior ED visits.  ____________________________________________   FINAL CLINICAL IMPRESSION(S) / ED DIAGNOSES  Final diagnoses:  Anaphylaxis, initial encounter      NEW MEDICATIONS STARTED DURING THIS VISIT:  New Prescriptions   EPINEPHRINE 0.3 MG/0.3 ML IJ SOAJ INJECTION    Inject 0.3 mLs (0.3 mg total) into the muscle once for 1 dose.    PREDNISONE (DELTASONE) 20 MG TABLET    Take 2 tablets (40 mg total) by mouth daily for 5 days.     Note:  This document was prepared using Dragon voice recognition software and may include unintentional dictation errors.    Willy Eddy, MD 12/11/17 1534

## 2017-12-11 NOTE — Discharge Instructions (Addendum)
Please take the prednisone as directed.  You can also take 1 over-the-counter Benadryl 4 times a day for 1 more day.  Be careful that may make you sleepy.  Do not fall.  Keep the epinephrine handy in case you needed if this happens again.  Call 911 to return immediately if your symptoms return or worsen.  Please follow-up with your regular doctor later this week.

## 2017-12-11 NOTE — ED Notes (Signed)
Pt had bowel movement in her clothes. Pt helped to be cleaned up and given blue scrubs. Pt reports nausea. MD made aware

## 2018-01-14 DIAGNOSIS — I10 Essential (primary) hypertension: Secondary | ICD-10-CM | POA: Insufficient documentation

## 2018-02-08 ENCOUNTER — Other Ambulatory Visit: Payer: Self-pay | Admitting: Internal Medicine

## 2018-02-08 DIAGNOSIS — Z1231 Encounter for screening mammogram for malignant neoplasm of breast: Secondary | ICD-10-CM

## 2018-05-13 ENCOUNTER — Encounter: Payer: Self-pay | Admitting: *Deleted

## 2018-05-16 ENCOUNTER — Ambulatory Visit
Admission: RE | Admit: 2018-05-16 | Discharge: 2018-05-16 | Disposition: A | Discharge status: Home / Self Care | Payer: Medicare Other | Attending: Gastroenterology | Admitting: Gastroenterology

## 2018-05-16 ENCOUNTER — Encounter
Admission: RE | Disposition: A | Discharge status: Home / Self Care | Payer: Self-pay | Source: Home / Self Care | Attending: Gastroenterology

## 2018-05-16 ENCOUNTER — Ambulatory Visit: Payer: Medicare Other | Admitting: Anesthesiology

## 2018-05-16 ENCOUNTER — Other Ambulatory Visit: Payer: Self-pay

## 2018-05-16 DIAGNOSIS — K3189 Other diseases of stomach and duodenum: Secondary | ICD-10-CM | POA: Diagnosis not present

## 2018-05-16 DIAGNOSIS — Z885 Allergy status to narcotic agent status: Secondary | ICD-10-CM | POA: Insufficient documentation

## 2018-05-16 DIAGNOSIS — Z79899 Other long term (current) drug therapy: Secondary | ICD-10-CM | POA: Diagnosis not present

## 2018-05-16 DIAGNOSIS — Z794 Long term (current) use of insulin: Secondary | ICD-10-CM | POA: Diagnosis not present

## 2018-05-16 DIAGNOSIS — F419 Anxiety disorder, unspecified: Secondary | ICD-10-CM | POA: Insufficient documentation

## 2018-05-16 DIAGNOSIS — K219 Gastro-esophageal reflux disease without esophagitis: Secondary | ICD-10-CM | POA: Diagnosis not present

## 2018-05-16 DIAGNOSIS — Z79890 Hormone replacement therapy (postmenopausal): Secondary | ICD-10-CM | POA: Diagnosis not present

## 2018-05-16 DIAGNOSIS — K208 Other esophagitis: Secondary | ICD-10-CM | POA: Insufficient documentation

## 2018-05-16 DIAGNOSIS — Z791 Long term (current) use of non-steroidal anti-inflammatories (NSAID): Secondary | ICD-10-CM | POA: Diagnosis not present

## 2018-05-16 DIAGNOSIS — Z880 Allergy status to penicillin: Secondary | ICD-10-CM | POA: Diagnosis not present

## 2018-05-16 DIAGNOSIS — E039 Hypothyroidism, unspecified: Secondary | ICD-10-CM | POA: Diagnosis not present

## 2018-05-16 DIAGNOSIS — K2960 Other gastritis without bleeding: Secondary | ICD-10-CM | POA: Insufficient documentation

## 2018-05-16 DIAGNOSIS — I2510 Atherosclerotic heart disease of native coronary artery without angina pectoris: Secondary | ICD-10-CM | POA: Insufficient documentation

## 2018-05-16 DIAGNOSIS — Z951 Presence of aortocoronary bypass graft: Secondary | ICD-10-CM | POA: Diagnosis not present

## 2018-05-16 DIAGNOSIS — Z7982 Long term (current) use of aspirin: Secondary | ICD-10-CM | POA: Diagnosis not present

## 2018-05-16 DIAGNOSIS — I10 Essential (primary) hypertension: Secondary | ICD-10-CM | POA: Diagnosis not present

## 2018-05-16 DIAGNOSIS — E119 Type 2 diabetes mellitus without complications: Secondary | ICD-10-CM | POA: Insufficient documentation

## 2018-05-16 DIAGNOSIS — K449 Diaphragmatic hernia without obstruction or gangrene: Secondary | ICD-10-CM | POA: Diagnosis not present

## 2018-05-16 DIAGNOSIS — F329 Major depressive disorder, single episode, unspecified: Secondary | ICD-10-CM | POA: Diagnosis not present

## 2018-05-16 DIAGNOSIS — R1013 Epigastric pain: Secondary | ICD-10-CM | POA: Diagnosis present

## 2018-05-16 DIAGNOSIS — Z96651 Presence of right artificial knee joint: Secondary | ICD-10-CM | POA: Diagnosis not present

## 2018-05-16 HISTORY — PX: ESOPHAGOGASTRODUODENOSCOPY (EGD) WITH PROPOFOL: SHX5813

## 2018-05-16 LAB — GLUCOSE, CAPILLARY: Glucose-Capillary: 138 mg/dL — ABNORMAL HIGH (ref 70–99)

## 2018-05-16 SURGERY — ESOPHAGOGASTRODUODENOSCOPY (EGD) WITH PROPOFOL
Anesthesia: General

## 2018-05-16 MED ORDER — CLINDAMYCIN PHOSPHATE 300 MG/2ML IJ SOLN: 600.0000 mg | INTRAMUSCULAR | Status: DC

## 2018-05-16 MED ORDER — CLINDAMYCIN PHOSPHATE 600 MG/50ML IV SOLN
600.0000 mg | Freq: Once | INTRAVENOUS | Status: AC
  Administered 2018-05-16: 600 mg via INTRAVENOUS
  Filled 2018-05-16: qty 50

## 2018-05-16 MED ORDER — CLINDAMYCIN PHOSPHATE 600 MG/50ML IV SOLN
INTRAVENOUS | Status: AC
  Filled 2018-05-16: qty 50

## 2018-05-16 MED ORDER — PROPOFOL 500 MG/50ML IV EMUL
INTRAVENOUS | Status: DC | PRN
  Administered 2018-05-16: 150 ug/kg/min via INTRAVENOUS

## 2018-05-16 MED ORDER — EPHEDRINE SULFATE 50 MG/ML IJ SOLN
INTRAMUSCULAR | Status: AC
  Filled 2018-05-16: qty 1

## 2018-05-16 MED ORDER — SODIUM CHLORIDE 0.9 % IV SOLN
INTRAVENOUS | Status: DC
  Administered 2018-05-16: 1000 mL via INTRAVENOUS
  Administered 2018-05-16: 10:00:00 via INTRAVENOUS
  Filled 2018-05-16: qty 1000

## 2018-05-16 MED ORDER — PROPOFOL 500 MG/50ML IV EMUL
INTRAVENOUS | Status: AC
  Filled 2018-05-16: qty 50

## 2018-05-16 MED ORDER — PROPOFOL 10 MG/ML IV BOLUS
INTRAVENOUS | Status: DC | PRN
  Administered 2018-05-16 (×2): 20 mg via INTRAVENOUS
  Administered 2018-05-16: 40 mg via INTRAVENOUS

## 2018-05-16 MED ORDER — FENTANYL CITRATE (PF) 100 MCG/2ML IJ SOLN
INTRAMUSCULAR | Status: DC | PRN
  Administered 2018-05-16: 25 ug via INTRAVENOUS

## 2018-05-16 MED ORDER — LIDOCAINE HCL (PF) 2 % IJ SOLN
INTRAMUSCULAR | Status: AC
  Filled 2018-05-16: qty 10

## 2018-05-16 MED ORDER — EPHEDRINE SULFATE 50 MG/ML IJ SOLN
INTRAMUSCULAR | Status: DC | PRN
  Administered 2018-05-16: 10 mg via INTRAVENOUS

## 2018-05-16 MED ORDER — PROPOFOL 10 MG/ML IV BOLUS
INTRAVENOUS | Status: AC
  Filled 2018-05-16: qty 20

## 2018-05-16 MED ORDER — LIDOCAINE HCL (CARDIAC) PF 100 MG/5ML IV SOSY
PREFILLED_SYRINGE | INTRAVENOUS | Status: DC | PRN
  Administered 2018-05-16: 80 mg via INTRAVENOUS

## 2018-05-16 MED ORDER — FENTANYL CITRATE (PF) 100 MCG/2ML IJ SOLN
INTRAMUSCULAR | Status: AC
  Filled 2018-05-16: qty 2

## 2018-05-16 NOTE — Anesthesia Postprocedure Evaluation (Signed)
Anesthesia Post Note  Patient: Alexandria Marquez  Procedure(s) Performed: ESOPHAGOGASTRODUODENOSCOPY (EGD) WITH PROPOFOL (N/A )  Patient location during evaluation: Endoscopy Anesthesia Type: General Level of consciousness: awake and alert Pain management: pain level controlled Vital Signs Assessment: post-procedure vital signs reviewed and stable Respiratory status: spontaneous breathing, nonlabored ventilation, respiratory function stable and patient connected to nasal cannula oxygen Cardiovascular status: blood pressure returned to baseline and stable Postop Assessment: no apparent nausea or vomiting Anesthetic complications: no     Last Vitals:  Vitals:   05/16/18 0825 05/16/18 1005  BP: (!) 134/59 123/69  Pulse: 66 76  Resp: 18   Temp: (!) 36.1 C (!) 36.1 C  SpO2: 95% 100%    Last Pain:  Vitals:   05/16/18 1028  TempSrc:   PainSc: 0-No pain                 Cleda Mccreedy [name]

## 2018-05-16 NOTE — H&P (Addendum)
Outpatient short stay form Pre-procedure 05/16/2018 9:21 AM Christena Deem MD  Primary Physician: Dr. Leotis Shames  Reason for visit: EGD  History of present illness: Patient is a 73 year old female presenting today for an EGD in regards to epigastric pain and dyspepsia.  She gets bloating symptoms in the epigastrium after eating.  This been going on for out a year.  She does have a history of ischemic colitis on a previous colonoscopy however has not had that evaluated.  Has had multiple procedures including autograft in the spine Tatar cuff repair allografts in the spine, laminectomy, spinal fusions, right TKA, patient's orthopedic surgeon surgeon has requested antibiotics.  She will be given 600 mg of clindamycin  Due to PCN allergy  Clindamycin. Patient has been given antibiotics before endoscopic procedures previously.     Current Facility-Administered Medications:  .  0.9 %  sodium chloride infusion, , Intravenous, Continuous, Christena Deem, MD, Last Rate: 20 mL/hr at 05/16/18 0847, 1,000 mL at 05/16/18 0847 .  clindamycin (CLEOCIN) 600 MG/50ML IVPB, , , ,  .  clindamycin (CLEOCIN) IVPB 600 mg, 600 mg, Intravenous, Once, Christena Deem, MD, Last Rate: 100 mL/hr at 05/16/18 0917, 600 mg at 05/16/18 0076  Medications Prior to Admission  Medication Sig Dispense Refill Last Dose  . amLODipine (NORVASC) 5 MG tablet Take 5 mg by mouth every evening.   05/16/2018 at 0720  . aspirin 81 MG tablet Take 1 tablet (81 mg total) by mouth daily. 30 tablet 0 05/15/2018 at Unknown time  . atorvastatin (LIPITOR) 40 MG tablet Take 40 mg by mouth at bedtime.    05/15/2018 at Unknown time  . enalapril (VASOTEC) 2.5 MG tablet Take 2.5 mg by mouth 2 (two) times daily.   05/16/2018 at 0720  . gabapentin (NEURONTIN) 300 MG capsule Take 600 mg by mouth at bedtime.   05/15/2018 at 2000  . glimepiride (AMARYL) 2 MG tablet Take 1 tablet (2 mg total) by mouth 2 (two) times daily. 60 tablet 0 05/15/2018 at  Unknown time  . hydrochlorothiazide (HYDRODIURIL) 25 MG tablet Take 12.5 mg by mouth daily.   05/16/2018 at 0720  . insulin detemir (LEVEMIR) 100 UNIT/ML injection Inject 25 Units into the skin daily.     Marland Kitchen levothyroxine (SYNTHROID, LEVOTHROID) 75 MCG tablet Take 75 mcg by mouth daily before breakfast.   05/15/2018 at 2000  . meloxicam (MOBIC) 7.5 MG tablet Take 7.5 mg by mouth daily.   Past Week at Unknown time  . metFORMIN (GLUCOPHAGE) 1000 MG tablet Take 1,000 mg by mouth 2 (two) times daily with a meal.   05/15/2018 at 2000  . pantoprazole (PROTONIX) 40 MG tablet Take 40 mg by mouth 2 (two) times daily.   05/15/2018 at 2000  . sertraline (ZOLOFT) 100 MG tablet Take 100 mg by mouth daily.   05/15/2018 at 2000  . tiZANidine (ZANAFLEX) 2 MG tablet Take 2 mg by mouth every 6 (six) hours as needed for muscle spasms.   05/15/2018 at Unknown time  . ciprofloxacin (CIPRO) 500 MG tablet Take 1 tablet (500 mg total) by mouth 2 (two) times daily. X 8 days (Patient not taking: Reported on 02/02/2017) 16 tablet 0 Completed Course at Unknown time  . dicyclomine (BENTYL) 10 MG capsule Take 1 capsule (10 mg total) by mouth 3 (three) times daily before meals. X 10 days (Patient not taking: Reported on 02/02/2017) 30 capsule 0 Completed Course at Unknown time  . DULoxetine (CYMBALTA) 60 MG capsule Take  60 mg by mouth at bedtime.    Not Taking at Unknown time  . fluticasone (FLONASE) 50 MCG/ACT nasal spray Place 1-2 sprays into both nostrils daily as needed for allergies.    Taking  . metroNIDAZOLE (FLAGYL) 500 MG tablet Take 1 tablet (500 mg total) by mouth every 8 (eight) hours. X 8 days (Patient not taking: Reported on 05/16/2018) 24 tablet 0 Not Taking at Unknown time  . Polyethyl Glycol-Propyl Glycol (SYSTANE) 0.4-0.3 % SOLN Place 1 drop into both eyes 3 (three) times daily as needed (for dry eyes).   Taking  . Saccharomyces boulardii (PROBIOTIC) 250 MG CAPS Take 1 capsule by mouth daily. X 2 weeks (Patient not  taking: Reported on 05/16/2018) 14 capsule 0 Not Taking at Unknown time  . traMADol (ULTRAM) 50 MG tablet Take 1 tablet (50 mg total) by mouth every 6 (six) hours as needed for moderate pain or severe pain. (Patient not taking: Reported on 02/02/2017) 20 tablet 0 Not Taking at Unknown time     Allergies  Allergen Reactions  . Morphine And Related Nausea And Vomiting    Severe n & v. Any other pain medications are okay  . Penicillins Hives    Has patient had a PCN reaction causing immediate rash, facial/tongue/throat swelling, SOB or lightheadedness with hypotension: No Has patient had a PCN reaction causing severe rash involving mucus membranes or skin necrosis: No Has patient had a PCN reaction that required hospitalization No Has patient had a PCN reaction occurring within the last 10 years: No If all of the above answers are "NO", then may proceed with Cephalosporin use.      Past Medical History:  Diagnosis Date  . Anxiety   . Arthritis   . Asthma    when young  . Coronary artery disease   . Depression   . Diabetes mellitus without complication (HCC)   . Environmental and seasonal allergies   . GERD (gastroesophageal reflux disease)   . History of hiatal hernia   . Hypertension   . Hypothyroidism   . Lower extremity edema   . Motion sickness   . Scoliosis   . Shortness of breath dyspnea    doe    Review of systems:      Physical Exam    Heart and lungs: Regular rate and rhythm without rub or gallop, lungs are bilaterally clear.    HEENT: Normocephalic atraumatic eyes are anicteric    Other:    Pertinant exam for procedure: Soft nontender nondistended bowel sounds positive normoactive, mild discomfort to palpation the epigastric region.  There are no masses or rebound.    Planned proceedures: EGD and indicated procedures. I have discussed the risks benefits and complications of procedures to include not limited to bleeding, infection, perforation and the risk  of sedation and the patient wishes to proceed.    Christena Deem, MD Gastroenterology 05/16/2018  9:21 AM

## 2018-05-16 NOTE — Op Note (Addendum)
[facility] Gastroenterology Patient Name: Alexandria Marquez Procedure Date: 05/16/2018 8:36 AM MRN: [id number] Account #: [id number] Date of Birth: September 15, 1945 Admit Type: Outpatient Age: 73 Room: [facility] ENDO ROOM 2 Gender: Female Note Status: Finalized Procedure:            Upper GI endoscopy Indications:          Epigastric abdominal pain, Dyspepsia Providers:            Christena Deem, MD Medicines:            Monitored Anesthesia Care Complications:        No immediate complications. Procedure:            Pre-Anesthesia Assessment:                       - ASA Grade Assessment: III - A patient with severe                        systemic disease.                       After obtaining informed consent, the endoscope was                        passed under direct vision. Throughout the procedure,                        the patient's blood pressure, pulse, and oxygen                        saturations were monitored continuously. The Endoscope                        was introduced through the mouth, and advanced to the                        third part of duodenum. The upper GI endoscopy was                        accomplished without difficulty. The patient tolerated                        the procedure well. Findings:      LA Grade B (one or more mucosal breaks greater than 5 mm, not extending       between the tops of two mucosal folds) esophagitis with no bleeding was       found. Biopsies were taken with a cold forceps for histology.      A small hiatal hernia was found. The Z-line was a variable distance from       incisors; the hiatal hernia was sliding.      Patchy minimal inflammation characterized by erosions was found in the       gastric antrum. Biopsies were taken with a cold forceps for histology.       Biopsies were taken with a cold forceps for Helicobacter pylori testing.      Patchy mild mucosal variance characterized by altered texture was found    in the second portion of the duodenum and in the third portion of the       duodenum. Biopsies were taken with a cold forceps for histology.  A single 9 mm submucosal papule (nodule) with no bleeding and no       stigmata of recent bleeding was found on the posterior wall of the       gastric body. Biopsies were taken with a cold forceps for histology.      The cardia and gastric fundus were normal on retroflexion otherwise. Impression:           - LA Grade B erosive esophagitis. Biopsied.                       - Small hiatal hernia.                       - Erosive gastritis. Biopsied.                       - Mucosal variant in the duodenum. Biopsied.                       - A single submucosal papule (nodule) found in the                        stomach. Biopsied. Recommendation:       - Discharge patient to home.                       - Await pathology results. Procedure Code(s):    --- Professional ---                       [phone], Esophagogastroduodenoscopy, flexible, transoral;                        with biopsy, single or multiple Diagnosis Code(s):    --- Professional ---                       K20.8, Other esophagitis                       K44.9, Diaphragmatic hernia without obstruction or                        gangrene                       K29.60, Other gastritis without bleeding                       K31.89, Other diseases of stomach and duodenum                       R10.13, Epigastric pain CPT copyright 2018 American Medical Association. All rights reserved. The codes documented in this report are preliminary and upon coder review may  be revised to meet current compliance requirements. Christena Deem, MD 05/16/2018 10:05:25 AM This report has been signed electronically. Number of Addenda: 0 Note Initiated On: 05/16/2018 8:36 AM      [facility]

## 2018-05-16 NOTE — Anesthesia Procedure Notes (Signed)
Date/Time: 05/16/2018 9:48 AM Performed by: Henrietta Hoover, CRNA Pre-anesthesia Checklist: Patient identified, Emergency Drugs available, Suction available, Patient being monitored and Timeout performed Patient Re-evaluated:Patient Re-evaluated prior to induction Oxygen Delivery Method: Nasal cannula Induction Type: IV induction Placement Confirmation: positive ETCO2

## 2018-05-16 NOTE — Transfer of Care (Signed)
Immediate Anesthesia Transfer of Care Note  Patient: Alexandria Marquez  Procedure(s) Performed: ESOPHAGOGASTRODUODENOSCOPY (EGD) WITH PROPOFOL (N/A )  Patient Location: PACU  Anesthesia Type:General  Level of Consciousness: awake and alert   Airway & Oxygen Therapy: Patient Spontanous Breathing and Patient connected to nasal cannula oxygen  Post-op Assessment: Report given to RN and Post -op Vital signs reviewed and stable  Post vital signs: Reviewed and stable  Last Vitals:  Vitals Value Taken Time  BP 135/69 05/16/2018 10:06 AM  Temp 36.1 C 05/16/2018 10:05 AM  Pulse 76 05/16/2018 10:07 AM  Resp 15 05/16/2018 10:10 AM  SpO2 100 % 05/16/2018 10:07 AM  Vitals shown include unvalidated device data.  Last Pain:  Vitals:   05/16/18 1005  TempSrc: Tympanic  PainSc:          Complications: No apparent anesthesia complications

## 2018-05-16 NOTE — Anesthesia Preprocedure Evaluation (Signed)
Anesthesia Evaluation  Patient identified by MRN, date of birth, ID band Patient awake    Reviewed: Allergy & Precautions, H&P , NPO status , Patient's Chart, lab work & pertinent test results  History of Anesthesia Complications Negative for: history of anesthetic complications  Airway Mallampati: III  TM Distance: <3 FB Neck ROM: limited    Dental  (+) Chipped, Poor Dentition   Pulmonary neg shortness of breath, asthma ,           Cardiovascular Exercise Tolerance: Good hypertension, (-) angina+ CAD and + CABG  (-) Past MI and (-) DOE      Neuro/Psych PSYCHIATRIC DISORDERS negative neurological ROS     GI/Hepatic Neg liver ROS, hiatal hernia, GERD  ,  Endo/Other  diabetes, Type 2, Insulin DependentHypothyroidism   Renal/GU negative Renal ROS  negative genitourinary   Musculoskeletal  (+) Arthritis ,   Abdominal   Peds  Hematology negative hematology ROS (+)   Anesthesia Other Findings Past Medical History: No date: Anxiety No date: Arthritis No date: Asthma     Comment:  when young No date: Coronary artery disease No date: Depression No date: Diabetes mellitus without complication (HCC) No date: Environmental and seasonal allergies No date: GERD (gastroesophageal reflux disease) No date: History of hiatal hernia No date: Hypertension No date: Hypothyroidism No date: Lower extremity edema No date: Motion sickness No date: Scoliosis No date: Shortness of breath dyspnea     Comment:  doe  Past Surgical History: No date: ABDOMINAL HYSTERECTOMY No date: APPENDECTOMY 2014: BACK SURGERY     Comment:  lumbar fusion/plate/rods/screws 2005: CHOLECYSTECTOMY 01/18/2017: COLONOSCOPY WITH PROPOFOL; N/A     Comment:  Procedure: COLONOSCOPY WITH PROPOFOL;  Surgeon:               Christena Deem, MD;  Location: [facility] ENDOSCOPY;                Service: Endoscopy;  Laterality: N/A; No date: CORONARY ARTERY  BYPASS GRAFT     Comment:  2008 2 vessels 2008: CORONARY ARTERY BYPASS GRAFT No date: FRACTURE SURGERY 1987: HERNIA REPAIR     Comment:  incisional 2017: JOINT REPLACEMENT; Right     Comment:  knee 06/19/2016: ORIF ANKLE FRACTURE; Left     Comment:  Procedure: OPEN REDUCTION INTERNAL FIXATION (ORIF) ANKLE              FRACTURE;  Surgeon: Juanell Fairly, MD;  Location: [facility]               ORS;  Service: Orthopedics;  Laterality: Left; No date: rcr 2013: ROTATOR CUFF REPAIR; Left No date: TONSILLECTOMY 12/19/2015: TOTAL KNEE ARTHROPLASTY; Right     Comment:  Procedure: TOTAL KNEE ARTHROPLASTY;  Surgeon: Christena Flake, MD;  Location: [facility] ORS;  Service: Orthopedics;                Laterality: Right; No date: TUBAL LIGATION  BMI    Body Mass Index:  34.37 kg/m      Reproductive/Obstetrics negative OB ROS                             Anesthesia Physical Anesthesia Plan  ASA: III  Anesthesia Plan: General   Post-op Pain Management:    Induction: Intravenous  PONV Risk Score and Plan: Propofol infusion and TIVA  Airway Management Planned: Natural  Airway and Nasal Cannula  Additional Equipment:   Intra-op Plan:   Post-operative Plan:   Informed Consent: I have reviewed the patients History and Physical, chart, labs and discussed the procedure including the risks, benefits and alternatives for the proposed anesthesia with the patient or authorized representative who has indicated his/her understanding and acceptance.     Dental Advisory Given  Plan Discussed with: Anesthesiologist, CRNA and Surgeon  Anesthesia Plan Comments: (Patient consented for risks of anesthesia including but not limited to:  - adverse reactions to medications - risk of intubation if required - damage to teeth, lips or other oral mucosa - sore throat or hoarseness - Damage to heart, brain, lungs or loss of life  Patient voiced understanding.)         Anesthesia Quick Evaluation

## 2018-05-16 NOTE — Anesthesia Post-op Follow-up Note (Signed)
Anesthesia QCDR form completed.        

## 2018-05-16 NOTE — OR Nursing (Signed)
Pt reports she has had a partial knee replacement and had an antibiotic prior to her  Colonoscopy, Clindamycin 600 mg ordered by Dr. Marva Panda prior to procedure.

## 2018-05-18 ENCOUNTER — Other Ambulatory Visit (INDEPENDENT_AMBULATORY_CARE_PROVIDER_SITE_OTHER): Payer: Medicare Other

## 2018-05-18 ENCOUNTER — Other Ambulatory Visit: Payer: Self-pay

## 2018-05-18 ENCOUNTER — Encounter (INDEPENDENT_AMBULATORY_CARE_PROVIDER_SITE_OTHER): Payer: Self-pay | Admitting: Vascular Surgery

## 2018-05-18 ENCOUNTER — Encounter (INDEPENDENT_AMBULATORY_CARE_PROVIDER_SITE_OTHER): Payer: Self-pay

## 2018-05-18 ENCOUNTER — Other Ambulatory Visit (INDEPENDENT_AMBULATORY_CARE_PROVIDER_SITE_OTHER): Payer: Self-pay | Admitting: Vascular Surgery

## 2018-05-18 ENCOUNTER — Ambulatory Visit (INDEPENDENT_AMBULATORY_CARE_PROVIDER_SITE_OTHER): Payer: Medicare Other | Admitting: Vascular Surgery

## 2018-05-18 VITALS — BP 158/74 | HR 58 | Resp 16 | Ht 60.0 in | Wt 173.0 lb

## 2018-05-18 DIAGNOSIS — K551 Chronic vascular disorders of intestine: Secondary | ICD-10-CM | POA: Diagnosis not present

## 2018-05-18 DIAGNOSIS — E785 Hyperlipidemia, unspecified: Secondary | ICD-10-CM | POA: Diagnosis not present

## 2018-05-18 DIAGNOSIS — I771 Stricture of artery: Principal | ICD-10-CM

## 2018-05-18 DIAGNOSIS — E119 Type 2 diabetes mellitus without complications: Secondary | ICD-10-CM

## 2018-05-18 DIAGNOSIS — I10 Essential (primary) hypertension: Secondary | ICD-10-CM

## 2018-05-18 LAB — SURGICAL PATHOLOGY

## 2018-05-18 NOTE — Progress Notes (Signed)
Subjective:    Patient ID: Alexandria [facility], female    DOB: [birth year], 73 y.o.   MRN: [id number] Chief Complaint  Patient presents with  . Follow-up    ref Kathryne Hitch for mesenteric ischemia   Patient was last seen November 2018 by Dr. dew.  Patient was seen in regard to ischemic colitis and mesenteric artery stenosis.  Recommendation was to follow-up and undergo a mesenteric duplex however the patient did not return.  The patient presents today after being seen by her gastroenterologist with continued/worsening symptoms.  Patient recently went underwent endoscopy however has not received results.  Patient continues to experience postprandial pain and nausea.  Denies any vomiting.  Denies any changes in bowel habits.  Denies any fever.  The patient underwent a mesenteric artery duplex which was notable for 70% to 99% stenosis in the celiac and superior mesenteric artery.  Review of Systems  Constitutional: Negative.   HENT: Negative.   Eyes: Negative.   Respiratory: Negative.   Cardiovascular: Negative.   Gastrointestinal: Positive for abdominal pain and nausea.  Endocrine: Negative.   Genitourinary: Negative.   Musculoskeletal: Negative.   Skin: Negative.   Allergic/Immunologic: Negative.   Neurological: Negative.   Hematological: Negative.   Psychiatric/Behavioral: Negative.       Objective:   Physical Exam Constitutional:      Appearance: Normal appearance. She is obese.  HENT:     Head: Normocephalic and atraumatic.     Right Ear: External ear normal.     Left Ear: External ear normal.     Nose: Nose normal.     Mouth/Throat:     Mouth: Mucous membranes are moist.     Pharynx: Oropharynx is clear.  Eyes:     Extraocular Movements: Extraocular movements intact.     Conjunctiva/sclera: Conjunctivae normal.     Pupils: Pupils are equal, round, and reactive to light.  Neck:     Musculoskeletal: Normal range of motion.  Cardiovascular:     Rate and Rhythm: Normal rate and  regular rhythm.  Pulmonary:     Effort: Pulmonary effort is normal.     Breath sounds: Normal breath sounds.  Abdominal:     General: Abdomen is flat. Bowel sounds are normal. There is no distension.     Palpations: Abdomen is soft.     Tenderness: There is no abdominal tenderness. There is no guarding.  Musculoskeletal: Normal range of motion.  Skin:    General: Skin is warm and dry.  Neurological:     General: No focal deficit present.     Mental Status: She is alert and oriented to person, place, and time. Mental status is at baseline.  Psychiatric:        Mood and Affect: Mood normal.        Behavior: Behavior normal.        Thought Content: Thought content normal.        Judgment: Judgment normal.    BP (!) 158/74 (BP Location: Right Arm)   Pulse (!) 58   Resp 16   Ht 5' (1.524 m)   Wt 173 lb (78.5 kg)   BMI 33.79 kg/m   Past Medical History:  Diagnosis Date  . Anxiety   . Arthritis   . Asthma    when young  . Coronary artery disease   . Depression   . Diabetes mellitus without complication (HCC)   . Environmental and seasonal allergies   . GERD (gastroesophageal reflux disease)   .  History of hiatal hernia   . Hypertension   . Hypothyroidism   . Lower extremity edema   . Motion sickness   . Scoliosis   . Shortness of breath dyspnea    doe   Social History   Socioeconomic History  . Marital status: Married    Spouse name: Not on file  . Number of children: Not on file  . Years of education: Not on file  . Highest education level: Not on file  Occupational History  . Not on file  Social Needs  . Financial resource strain: Not on file  . Food insecurity:    Worry: Not on file    Inability: Not on file  . Transportation needs:    Medical: Not on file    Non-medical: Not on file  Tobacco Use  . Smoking status: Never Smoker  . Smokeless tobacco: Never Used  Substance and Sexual Activity  . Alcohol use: No  . Drug use: No  . Sexual activity:  Never  Lifestyle  . Physical activity:    Days per week: Not on file    Minutes per session: Not on file  . Stress: Not on file  Relationships  . Social connections:    Talks on phone: Not on file    Gets together: Not on file    Attends religious service: Not on file    Active member of club or organization: Not on file    Attends meetings of clubs or organizations: Not on file    Relationship status: Not on file  . Intimate partner violence:    Fear of current or ex partner: Not on file    Emotionally abused: Not on file    Physically abused: Not on file    Forced sexual activity: Not on file  Other Topics Concern  . Not on file  Social History Narrative  . Not on file   Past Surgical History:  Procedure Laterality Date  . ABDOMINAL HYSTERECTOMY    . APPENDECTOMY    . BACK SURGERY  2014   lumbar fusion/plate/rods/screws  . CHOLECYSTECTOMY  2005  . COLONOSCOPY WITH PROPOFOL N/A 01/18/2017   Procedure: COLONOSCOPY WITH PROPOFOL;  Surgeon: Christena Deem, MD;  Location: [facility]COPY;  Service: Endoscopy;  Laterality: N/A;  . CORONARY ARTERY BYPASS GRAFT     2008 2 vessels  . CORONARY ARTERY BYPASS GRAFT  2008  . ESOPHAGOGASTRODUODENOSCOPY (EGD) WITH PROPOFOL N/A 05/16/2018   Procedure: ESOPHAGOGASTRODUODENOSCOPY (EGD) WITH PROPOFOL;  Surgeon: Christena Deem, MD;  Location: [facility] ENDOSCOPY;  Service: Endoscopy;  Laterality: N/A;  . FRACTURE SURGERY    . HERNIA REPAIR  1987   incisional  . JOINT REPLACEMENT Right 2017   knee  . ORIF ANKLE FRACTURE Left 06/19/2016   Procedure: OPEN REDUCTION INTERNAL FIXATION (ORIF) ANKLE FRACTURE;  Surgeon: Juanell Fairly, MD;  Location: [facility] ORS;  Service: Orthopedics;  Laterality: Left;  . rcr    . ROTATOR CUFF REPAIR Left 2013  . TONSILLECTOMY    . TOTAL KNEE ARTHROPLASTY Right 12/19/2015   Procedure: TOTAL KNEE ARTHROPLASTY;  Surgeon: Christena Flake, MD;  Location: [facility] ORS;  Service: Orthopedics;  Laterality: Right;  . TUBAL  LIGATION     Family History  Problem Relation Age of Onset  . Breast cancer Maternal Aunt 30  . Heart disease Mother   . Heart attack Mother   . Heart disease Father   . Heart attack Father   . Diabetes Father  Allergies  Allergen Reactions  . Morphine And Related Nausea And Vomiting    Severe n & v. Any other pain medications are okay  . Penicillins Hives    Has patient had a PCN reaction causing immediate rash, facial/tongue/throat swelling, SOB or lightheadedness with hypotension: No Has patient had a PCN reaction causing severe rash involving mucus membranes or skin necrosis: No Has patient had a PCN reaction that required hospitalization No Has patient had a PCN reaction occurring within the last 10 years: No If all of the above answers are "NO", then may proceed with Cephalosporin use.       Assessment & Plan:  Patient was last seen November 2018 by Dr. dew.  Patient was seen in regard to ischemic colitis and mesenteric artery stenosis.  Recommendation was to follow-up and undergo a mesenteric duplex however the patient did not return.  The patient presents today after being seen by her gastroenterologist with continued/worsening symptoms.  Patient recently went underwent endoscopy however has not received results.  Patient continues to experience postprandial pain and nausea.  Denies any vomiting.  Denies any changes in bowel habits.  Denies any fever.  The patient underwent a mesenteric artery duplex which was notable for 70% to 99% stenosis in the celiac and superior mesenteric artery.  1. Chronic mesenteric ischemia (HCC) - Stable Patient with known mesenteric artery stenosis however lost to follow-up since November 2018 Presents today with continued and worsening postprandial pain and nausea. Referred to our office by her gastroenterologist after what sounds like an essentially normal endoscopy. Mesenteric artery duplex with 70-90% stenosis of the celiac and mesenteric  artery Recommend a mesenteric angiogram with possible intervention and attempt to assess the patient's anatomy and contributing degree of atherosclerotic disease.  If appropriate an attempt to revascularize the mesentery can be made at that time. Procedure, risks and benefits explained to the patient All questions answered The patient wishes to proceed  2. Essential hypertension - Stable Encouraged good control as its slows the progression of atherosclerotic disease  3. Type 2 diabetes mellitus without complication, unspecified whether long term insulin use (HCC) - Stable Encouraged good control as its slows the progression of atherosclerotic disease  4. Hyperlipidemia, unspecified hyperlipidemia type - Stable Encouraged good control as its slows the progression of atherosclerotic disease  Current Outpatient Medications on File Prior to Visit  Medication Sig Dispense Refill  . amLODipine (NORVASC) 5 MG tablet Take 5 mg by mouth every evening.    Marland Kitchen aspirin 81 MG tablet Take 1 tablet (81 mg total) by mouth daily. 30 tablet 0  . atorvastatin (LIPITOR) 40 MG tablet Take 40 mg by mouth at bedtime.     . DULoxetine (CYMBALTA) 60 MG capsule Take 60 mg by mouth at bedtime.     . enalapril (VASOTEC) 2.5 MG tablet Take 2.5 mg by mouth 2 (two) times daily.    . fluticasone (FLONASE) 50 MCG/ACT nasal spray Place 1-2 sprays into both nostrils daily as needed for allergies.     Marland Kitchen gabapentin (NEURONTIN) 300 MG capsule Take 600 mg by mouth at bedtime.    Marland Kitchen glimepiride (AMARYL) 2 MG tablet Take 1 tablet (2 mg total) by mouth 2 (two) times daily. 60 tablet 0  . hydrochlorothiazide (HYDRODIURIL) 25 MG tablet Take 12.5 mg by mouth daily.    . insulin detemir (LEVEMIR) 100 UNIT/ML injection Inject 25 Units into the skin daily.    Marland Kitchen levothyroxine (SYNTHROID, LEVOTHROID) 75 MCG tablet Take 75 mcg by  mouth daily before breakfast.    . meloxicam (MOBIC) 7.5 MG tablet Take 7.5 mg by mouth daily.    . metFORMIN  (GLUCOPHAGE) 1000 MG tablet Take 1,000 mg by mouth 2 (two) times daily with a meal.    . pantoprazole (PROTONIX) 40 MG tablet Take 40 mg by mouth 2 (two) times daily.    Bertram Gala Glycol-Propyl Glycol (SYSTANE) 0.4-0.3 % SOLN Place 1 drop into both eyes 3 (three) times daily as needed (for dry eyes).    . sertraline (ZOLOFT) 100 MG tablet Take 100 mg by mouth daily.    Marland Kitchen tiZANidine (ZANAFLEX) 2 MG tablet Take 2 mg by mouth every 6 (six) hours as needed for muscle spasms.    . ciprofloxacin (CIPRO) 500 MG tablet Take 1 tablet (500 mg total) by mouth 2 (two) times daily. X 8 days (Patient not taking: Reported on 02/02/2017) 16 tablet 0  . dicyclomine (BENTYL) 10 MG capsule Take 1 capsule (10 mg total) by mouth 3 (three) times daily before meals. X 10 days (Patient not taking: Reported on 02/02/2017) 30 capsule 0  . metroNIDAZOLE (FLAGYL) 500 MG tablet Take 1 tablet (500 mg total) by mouth every 8 (eight) hours. X 8 days (Patient not taking: Reported on 05/16/2018) 24 tablet 0  . Saccharomyces boulardii (PROBIOTIC) 250 MG CAPS Take 1 capsule by mouth daily. X 2 weeks (Patient not taking: Reported on 05/16/2018) 14 capsule 0  . traMADol (ULTRAM) 50 MG tablet Take 1 tablet (50 mg total) by mouth every 6 (six) hours as needed for moderate pain or severe pain. (Patient not taking: Reported on 02/02/2017) 20 tablet 0   No current facility-administered medications on file prior to visit.    There are no Patient Instructions on file for this visit. No follow-ups on file.  Cresta Riden A Hend Mccarrell, PA-C

## 2018-05-23 ENCOUNTER — Ambulatory Visit
Admission: RE | Admit: 2018-05-23 | Discharge: 2018-05-23 | Disposition: A | Discharge status: Home / Self Care | Payer: Medicare Other | Source: Ambulatory Visit | Attending: Vascular Surgery | Admitting: Vascular Surgery

## 2018-05-23 ENCOUNTER — Other Ambulatory Visit (INDEPENDENT_AMBULATORY_CARE_PROVIDER_SITE_OTHER): Payer: Self-pay | Admitting: Nurse Practitioner

## 2018-05-23 ENCOUNTER — Other Ambulatory Visit: Payer: Self-pay

## 2018-05-23 ENCOUNTER — Encounter
Admission: RE | Disposition: A | Discharge status: Home / Self Care | Payer: Self-pay | Source: Ambulatory Visit | Attending: Vascular Surgery

## 2018-05-23 ENCOUNTER — Telehealth (INDEPENDENT_AMBULATORY_CARE_PROVIDER_SITE_OTHER): Payer: Self-pay | Admitting: Vascular Surgery

## 2018-05-23 DIAGNOSIS — Z7982 Long term (current) use of aspirin: Secondary | ICD-10-CM | POA: Insufficient documentation

## 2018-05-23 DIAGNOSIS — E669 Obesity, unspecified: Secondary | ICD-10-CM | POA: Diagnosis not present

## 2018-05-23 DIAGNOSIS — Z79890 Hormone replacement therapy (postmenopausal): Secondary | ICD-10-CM | POA: Diagnosis not present

## 2018-05-23 DIAGNOSIS — I2510 Atherosclerotic heart disease of native coronary artery without angina pectoris: Secondary | ICD-10-CM | POA: Diagnosis not present

## 2018-05-23 DIAGNOSIS — I774 Celiac artery compression syndrome: Secondary | ICD-10-CM | POA: Diagnosis not present

## 2018-05-23 DIAGNOSIS — K551 Chronic vascular disorders of intestine: Secondary | ICD-10-CM | POA: Insufficient documentation

## 2018-05-23 DIAGNOSIS — I10 Essential (primary) hypertension: Secondary | ICD-10-CM | POA: Insufficient documentation

## 2018-05-23 DIAGNOSIS — Z79899 Other long term (current) drug therapy: Secondary | ICD-10-CM | POA: Insufficient documentation

## 2018-05-23 DIAGNOSIS — E039 Hypothyroidism, unspecified: Secondary | ICD-10-CM | POA: Insufficient documentation

## 2018-05-23 DIAGNOSIS — M419 Scoliosis, unspecified: Secondary | ICD-10-CM | POA: Insufficient documentation

## 2018-05-23 DIAGNOSIS — K219 Gastro-esophageal reflux disease without esophagitis: Secondary | ICD-10-CM | POA: Insufficient documentation

## 2018-05-23 DIAGNOSIS — E119 Type 2 diabetes mellitus without complications: Secondary | ICD-10-CM | POA: Diagnosis not present

## 2018-05-23 DIAGNOSIS — Z794 Long term (current) use of insulin: Secondary | ICD-10-CM | POA: Diagnosis not present

## 2018-05-23 DIAGNOSIS — K55059 Acute (reversible) ischemia of intestine, part and extent unspecified: Secondary | ICD-10-CM | POA: Diagnosis not present

## 2018-05-23 DIAGNOSIS — K559 Vascular disorder of intestine, unspecified: Secondary | ICD-10-CM

## 2018-05-23 DIAGNOSIS — R609 Edema, unspecified: Secondary | ICD-10-CM | POA: Insufficient documentation

## 2018-05-23 DIAGNOSIS — E785 Hyperlipidemia, unspecified: Secondary | ICD-10-CM | POA: Insufficient documentation

## 2018-05-23 DIAGNOSIS — Z6833 Body mass index (BMI) 33.0-33.9, adult: Secondary | ICD-10-CM | POA: Diagnosis not present

## 2018-05-23 HISTORY — PX: VISCERAL ANGIOGRAPHY: CATH118276

## 2018-05-23 LAB — CREATININE, SERUM
Creatinine, Ser: 1.15 mg/dL — ABNORMAL HIGH (ref 0.44–1.00)
GFR calc Af Amer: 55 mL/min — ABNORMAL LOW (ref >60)
GFR calc non Af Amer: 47 mL/min — ABNORMAL LOW (ref >60)

## 2018-05-23 LAB — GLUCOSE, CAPILLARY
Glucose-Capillary: 171 mg/dL — ABNORMAL HIGH (ref 70–99)
Glucose-Capillary: 139 mg/dL — ABNORMAL HIGH (ref 70–99)

## 2018-05-23 LAB — BUN: BUN: 28 mg/dL — ABNORMAL HIGH (ref 8–23)

## 2018-05-23 SURGERY — VISCERAL ANGIOGRAPHY
Anesthesia: Moderate Sedation

## 2018-05-23 MED ORDER — METHYLPREDNISOLONE SODIUM SUCC 125 MG IJ SOLR: 125.0000 mg | Freq: Once | INTRAMUSCULAR | Status: DC | PRN

## 2018-05-23 MED ORDER — CLOPIDOGREL BISULFATE 75 MG PO TABS: 75.0000 mg | ORAL_TABLET | Freq: Every day | ORAL | 11 refills | Status: DC

## 2018-05-23 MED ORDER — FENTANYL CITRATE (PF) 100 MCG/2ML IJ SOLN
INTRAMUSCULAR | Status: DC | PRN
  Administered 2018-05-23: 50 ug via INTRAVENOUS
  Administered 2018-05-23: 25 ug via INTRAVENOUS

## 2018-05-23 MED ORDER — MIDAZOLAM HCL 2 MG/ML PO SYRP: 8.0000 mg | ORAL_SOLUTION | Freq: Once | ORAL | Status: DC | PRN

## 2018-05-23 MED ORDER — HYDROMORPHONE HCL 1 MG/ML IJ SOLN: 1.0000 mg | Freq: Once | INTRAMUSCULAR | Status: DC | PRN

## 2018-05-23 MED ORDER — FAMOTIDINE 20 MG PO TABS: 40.0000 mg | ORAL_TABLET | Freq: Once | ORAL | Status: DC | PRN

## 2018-05-23 MED ORDER — CLINDAMYCIN PHOSPHATE 300 MG/50ML IV SOLN
300.0000 mg | Freq: Once | INTRAVENOUS | Status: AC
  Administered 2018-05-23: 300 mg via INTRAVENOUS

## 2018-05-23 MED ORDER — MIDAZOLAM HCL 2 MG/2ML IJ SOLN
INTRAMUSCULAR | Status: DC | PRN
  Administered 2018-05-23 (×3): 1 mg via INTRAVENOUS

## 2018-05-23 MED ORDER — ONDANSETRON HCL 4 MG/2ML IJ SOLN: 4.0000 mg | Freq: Four times a day (QID) | INTRAMUSCULAR | Status: DC | PRN

## 2018-05-23 MED ORDER — LIDOCAINE-EPINEPHRINE (PF) 1 %-1:200000 IJ SOLN
INTRAMUSCULAR | Status: AC
  Filled 2018-05-23: qty 30

## 2018-05-23 MED ORDER — HEPARIN SODIUM (PORCINE) 1000 UNIT/ML IJ SOLN
INTRAMUSCULAR | Status: AC
  Filled 2018-05-23: qty 1

## 2018-05-23 MED ORDER — CLINDAMYCIN PHOSPHATE 300 MG/50ML IV SOLN
INTRAVENOUS | Status: AC
  Administered 2018-05-23: 300 mg via INTRAVENOUS
  Filled 2018-05-23: qty 50

## 2018-05-23 MED ORDER — HEPARIN SODIUM (PORCINE) 1000 UNIT/ML IJ SOLN
INTRAMUSCULAR | Status: DC | PRN
  Administered 2018-05-23: 5000 [IU] via INTRAVENOUS

## 2018-05-23 MED ORDER — FENTANYL CITRATE (PF) 100 MCG/2ML IJ SOLN
INTRAMUSCULAR | Status: AC
  Filled 2018-05-23: qty 2

## 2018-05-23 MED ORDER — IOPAMIDOL (ISOVUE-300) INJECTION 61%
INTRAVENOUS | Status: DC | PRN
  Administered 2018-05-23: 65 mL via INTRAVENOUS

## 2018-05-23 MED ORDER — SODIUM CHLORIDE 0.9 % IV SOLN
INTRAVENOUS | Status: DC
  Administered 2018-05-23: 12:00:00 via INTRAVENOUS

## 2018-05-23 MED ORDER — DIPHENHYDRAMINE HCL 50 MG/ML IJ SOLN: 50.0000 mg | Freq: Once | INTRAMUSCULAR | Status: DC | PRN

## 2018-05-23 MED ORDER — MIDAZOLAM HCL 5 MG/5ML IJ SOLN
INTRAMUSCULAR | Status: AC
  Filled 2018-05-23: qty 5

## 2018-05-23 SURGICAL SUPPLY — 15 items
CATH PIG 70CM (CATHETERS) ×2 IMPLANT
CATH VS15FR (CATHETERS) ×2 IMPLANT
DEVICE PRESTO INFLATION (MISCELLANEOUS) ×2 IMPLANT
DEVICE STARCLOSE SE CLOSURE (Vascular Products) ×2 IMPLANT
DEVICE TORQUE .025-.038 (MISCELLANEOUS) ×2 IMPLANT
GLIDEWIRE STIFF .35X180X3 HYDR (WIRE) ×2 IMPLANT
PACK ANGIOGRAPHY (CUSTOM PROCEDURE TRAY) ×2 IMPLANT
SHEATH ANL2 6FRX45 HC (SHEATH) ×2 IMPLANT
SHEATH BRITE TIP 5FRX11 (SHEATH) ×2 IMPLANT
SHEATH DESTIN RDC 6FR 45 (SHEATH) ×2 IMPLANT
STENT LIFESTREAM 7X16X80 (Permanent Stent) ×2 IMPLANT
SYR MEDRAD MARK 7 150ML (SYRINGE) ×2 IMPLANT
TUBING CONTRAST HIGH PRESS 72 (TUBING) ×2 IMPLANT
WIRE J 3MM .035X145CM (WIRE) ×2 IMPLANT
WIRE MAGIC TOR.035 180C (WIRE) ×2 IMPLANT

## 2018-05-23 NOTE — Telephone Encounter (Signed)
Jill Side from [facility] called stating Dew just saw patient in the hospital and needed [place] to call in Plavix for patient. 75mg  1 po qd to the [facility] on [address].   Medication called in. AS, CMA

## 2018-05-23 NOTE — H&P (Signed)
[facility] History & Physical Update  The patient was interviewed and re-examined.  The patient's previous History and Physical has been reviewed and is unchanged.  There is no change in the plan of care. We plan to proceed with the scheduled procedure.  Festus Barren, MD  05/23/2018, 11:35 AM

## 2018-05-23 NOTE — Op Note (Signed)
[facility] Percutaneous Study/Intervention Procedural Note   Date of Surgery: 05/23/2018  Surgeon(s): Leotis Pain   Assistants:None  Pre-operative Diagnosis: Chronic mesenteric ischemia, celiac and SMA stenosis Post-operative diagnosis: Same  Procedure(s) Performed: 1. Ultrasound guidance for vascular access right femoral artery 2. Catheter placement into celiac artery and SMA from right femoral approach 3. Aortogram and selective celiac and SMA angiogram 4. Balloon expandable stent placement to the superior mesenteric artery using a 7 mm diameter x 16 mm length balloon expandable Lifestream stent 5. StarClose closure device right femoral artery  Anesthesia: local with moderate conscious sedation for approximately 35 minutes using 3 mg of Versed and 75 Mcg of Fentanyl  Fluoro Time: 8.6 minutes  Contrast: 65 cc  Indications: Patient is a 73 year old female with symptoms consistent with chronic mesenteric ischemia and noninvasive study suggesting celiac and SMA stenosis of greater than 70%.  Angiogram is performed to evaluate the lesion more thoroughly and potentially allow treatment. Risks and benefits were discussed and informed consent was obtained  Procedure: The patient was identified and appropriate procedural time out was performed. The patient was then placed supine on the table and prepped and draped in the usual sterile fashion.Moderate conscious sedation was administered during a face to face encounter with the patient with the RN monitoring their vital signs, mental status, telemetry and pulse oximetry throughout the procedure.  Ultrasound was used to evaluate the right common femoral artery. It was patent . A digital ultrasound image was acquired. A Seldinger needle was used to access the right common femoral artery under direct ultrasound guidance and a permanent image  was performed. A 0.035 J wire was advanced without resistance and a 5Fr sheath was placed. Pigtail catheter was then placed into the aorta and initially an AP aortogram was performed which showed the right renal artery had an early bifurcation with what appeared to be mild stenosis.  There appeared to be 2 left renal arteries which were small and codominant.  The aorta and iliac arteries did not have significant stenosis.  The origins of the visceral vessels were not seen on the AP projection.  We then transition to the lateral projection and I used a V S1 catheter to selectively cannulate the celiac artery.  This confirmed a high-grade stenosis in the 80 to 85% range of the proximal celiac artery.  I then selective cannulated the SMA with a V S1 catheter. Selective imaging of the superior mesenteric artery demonstrated stenosis in the 70 to 75% range at the origin of the SMA. The patient was then given 5000 units of intravenous heparin and I exchanged for a 6 French sheath and Magic torque wire to perform treatment. I was able to navigate across the lesion without difficulty with a Glidewire and then exchanged Magic torque wire. I then selected a 7 mm diameter by 16 mm length balloon expandable stent and deployed this across the stenosis at the origin of the superior mesenteric artery just back into the aorta in excellent location. The balloon was inflated to 12 Atm.  A widely patent SMA stent was seen on completion angiogram and I elected to terminate the procedure.  Celiac stenosis would be addressed if her symptoms did not improve with treatment of her SMA lesion alone.  The guide catheter was removed and oblique arteriogram was performed of the right femoral artery. StarClose closure device was deployed in the usual fashion with excellent hemostatic result.  Findings:  Aortogram:The right renal artery had an early  bifurcation with what appeared to be mild stenosis.  There appeared to be 2  left renal arteries which were small and codominant.  The aorta and iliac arteries did not have significant stenosis.  The origins of the visceral vessels were not seen on the AP projection SMA:70 to 75% stenosis of the origin of the SMA  Celiac: 80 to 85% stenosis of the proximal celiac artery     Disposition: Patient was taken to the recovery room in stable condition having tolerated the procedure well.   Leotis Pain 05/23/2018 1:47 PM   This note was created with Dragon Medical transcription system. Any errors in dictation are purely unintentional.

## 2018-05-24 ENCOUNTER — Encounter: Payer: Self-pay | Admitting: Vascular Surgery

## 2018-05-26 ENCOUNTER — Telehealth (INDEPENDENT_AMBULATORY_CARE_PROVIDER_SITE_OTHER): Payer: Self-pay

## 2018-05-26 NOTE — Telephone Encounter (Signed)
Patient was inform with medical advice

## 2018-05-26 NOTE — Telephone Encounter (Signed)
She should continue aspirin and stop the meloxicam

## 2018-06-07 ENCOUNTER — Encounter (INDEPENDENT_AMBULATORY_CARE_PROVIDER_SITE_OTHER): Payer: Medicare Other

## 2018-06-07 ENCOUNTER — Ambulatory Visit (INDEPENDENT_AMBULATORY_CARE_PROVIDER_SITE_OTHER): Payer: Medicare Other | Admitting: Vascular Surgery

## 2018-06-16 ENCOUNTER — Other Ambulatory Visit (INDEPENDENT_AMBULATORY_CARE_PROVIDER_SITE_OTHER): Payer: Self-pay | Admitting: Vascular Surgery

## 2018-06-21 ENCOUNTER — Other Ambulatory Visit (INDEPENDENT_AMBULATORY_CARE_PROVIDER_SITE_OTHER): Payer: Self-pay | Admitting: Vascular Surgery

## 2018-06-21 ENCOUNTER — Other Ambulatory Visit: Payer: Self-pay

## 2018-06-21 ENCOUNTER — Ambulatory Visit (INDEPENDENT_AMBULATORY_CARE_PROVIDER_SITE_OTHER): Payer: Medicare Other | Admitting: Vascular Surgery

## 2018-06-21 ENCOUNTER — Encounter (INDEPENDENT_AMBULATORY_CARE_PROVIDER_SITE_OTHER): Payer: Self-pay | Admitting: Vascular Surgery

## 2018-06-21 ENCOUNTER — Ambulatory Visit (INDEPENDENT_AMBULATORY_CARE_PROVIDER_SITE_OTHER): Payer: Medicare Other

## 2018-06-21 VITALS — BP 144/74 | HR 64 | Resp 16 | Ht 60.0 in | Wt 171.2 lb

## 2018-06-21 DIAGNOSIS — I10 Essential (primary) hypertension: Secondary | ICD-10-CM | POA: Diagnosis not present

## 2018-06-21 DIAGNOSIS — K551 Chronic vascular disorders of intestine: Secondary | ICD-10-CM

## 2018-06-21 DIAGNOSIS — Z95820 Peripheral vascular angioplasty status with implants and grafts: Secondary | ICD-10-CM

## 2018-06-21 DIAGNOSIS — E785 Hyperlipidemia, unspecified: Secondary | ICD-10-CM

## 2018-06-21 DIAGNOSIS — Z79899 Other long term (current) drug therapy: Secondary | ICD-10-CM

## 2018-06-21 DIAGNOSIS — E119 Type 2 diabetes mellitus without complications: Secondary | ICD-10-CM | POA: Diagnosis not present

## 2018-06-21 NOTE — Assessment & Plan Note (Signed)
blood glucose control important in reducing the progression of atherosclerotic disease. Also, involved in wound healing. On appropriate medications.  

## 2018-06-21 NOTE — Assessment & Plan Note (Signed)
Her duplex today shows her SMA stent is patent although the velocities remain somewhat elevated although significantly improved.  Her celiac artery velocities remain markedly elevated consistent with a high-grade stenosis seen on angiography a few weeks ago. Symptomatically, she is not that much better.  At this point, I think considering celiac artery intervention as well would be the most prudent option.  Given all of the current issues with the coronavirus we are going to table that for 6 to 8 weeks as long as she can tolerate it clinically.  If her symptoms become dramatically worse, she will contact our office and we will do this sooner.

## 2018-06-21 NOTE — Assessment & Plan Note (Signed)
lipid control important in reducing the progression of atherosclerotic disease.   

## 2018-06-21 NOTE — Progress Notes (Signed)
MRN : [id number]  Alexandria Marquez is a 73 y.o. (06/11/1945) female who presents with chief complaint of  Chief Complaint  Patient presents with  . Follow-up    [facility] 4week mesenteric   .  History of Present Illness: Patient returns today in follow up of her mesenteric ischemic symptoms.  She underwent SMA stent placement a few weeks ago but says she is not really seen significant improvement of her postprandial abdominal pain or food fear.  If there is been any improvement it is been fairly mild.  She had significant celiac artery stenosis as well but we discussed that we would treat her SMA first and see how she responded.  Her duplex today shows her SMA stent is patent although the velocities remain somewhat elevated although significantly improved.  Her celiac artery velocities remain markedly elevated consistent with a high-grade stenosis seen on angiography a few weeks ago.  Current Outpatient Medications  Medication Sig Dispense Refill  . amLODipine (NORVASC) 5 MG tablet Take 5 mg by mouth every evening.    Marland Kitchen aspirin 81 MG tablet Take 1 tablet (81 mg total) by mouth daily. 30 tablet 0  . atorvastatin (LIPITOR) 40 MG tablet Take 40 mg by mouth at bedtime.     . clopidogrel (PLAVIX) 75 MG tablet TAKE 1 TABLET BY MOUTH EVERY DAY 30 tablet 0  . DULoxetine (CYMBALTA) 60 MG capsule Take 60 mg by mouth at bedtime.     . enalapril (VASOTEC) 2.5 MG tablet Take 2.5 mg by mouth 2 (two) times daily.    . fluticasone (FLONASE) 50 MCG/ACT nasal spray Place 1-2 sprays into both nostrils daily as needed for allergies.     Marland Kitchen gabapentin (NEURONTIN) 300 MG capsule Take 600 mg by mouth at bedtime.    Marland Kitchen glimepiride (AMARYL) 2 MG tablet Take 1 tablet (2 mg total) by mouth 2 (two) times daily. 60 tablet 0  . hydrochlorothiazide (HYDRODIURIL) 25 MG tablet Take 12.5 mg by mouth daily.    . insulin detemir (LEVEMIR) 100 UNIT/ML injection Inject 25 Units into the skin daily.    Marland Kitchen levothyroxine (SYNTHROID,  LEVOTHROID) 75 MCG tablet Take 75 mcg by mouth daily before breakfast.    . metFORMIN (GLUCOPHAGE) 1000 MG tablet Take 1,000 mg by mouth 2 (two) times daily with a meal.    . pantoprazole (PROTONIX) 40 MG tablet Take 40 mg by mouth 2 (two) times daily.    Bertram Gala Glycol-Propyl Glycol (SYSTANE) 0.4-0.3 % SOLN Place 1 drop into both eyes 3 (three) times daily as needed (for dry eyes).    . sertraline (ZOLOFT) 100 MG tablet Take 100 mg by mouth daily.    Marland Kitchen tiZANidine (ZANAFLEX) 2 MG tablet Take 2 mg by mouth every 6 (six) hours as needed for muscle spasms.    . ciprofloxacin (CIPRO) 500 MG tablet Take 1 tablet (500 mg total) by mouth 2 (two) times daily. X 8 days (Patient not taking: Reported on 02/02/2017) 16 tablet 0  . dicyclomine (BENTYL) 10 MG capsule Take 1 capsule (10 mg total) by mouth 3 (three) times daily before meals. X 10 days (Patient not taking: Reported on 02/02/2017) 30 capsule 0  . meloxicam (MOBIC) 7.5 MG tablet Take 7.5 mg by mouth daily.    . metroNIDAZOLE (FLAGYL) 500 MG tablet Take 1 tablet (500 mg total) by mouth every 8 (eight) hours. X 8 days (Patient not taking: Reported on 05/16/2018) 24 tablet 0  . Saccharomyces boulardii (PROBIOTIC) 250  MG CAPS Take 1 capsule by mouth daily. X 2 weeks (Patient not taking: Reported on 05/16/2018) 14 capsule 0  . traMADol (ULTRAM) 50 MG tablet Take 1 tablet (50 mg total) by mouth every 6 (six) hours as needed for moderate pain or severe pain. (Patient not taking: Reported on 02/02/2017) 20 tablet 0   No current facility-administered medications for this visit.     Past Medical History:  Diagnosis Date  . Anxiety   . Arthritis   . Asthma    when young  . Coronary artery disease   . Depression   . Diabetes mellitus without complication (HCC)   . Environmental and seasonal allergies   . GERD (gastroesophageal reflux disease)   . History of hiatal hernia   . Hypertension   . Hypothyroidism   . Lower extremity edema   . Motion  sickness   . Scoliosis   . Shortness of breath dyspnea    doe    Past Surgical History:  Procedure Laterality Date  . ABDOMINAL HYSTERECTOMY    . APPENDECTOMY    . BACK SURGERY  2014   lumbar fusion/plate/rods/screws  . CHOLECYSTECTOMY  2005  . COLONOSCOPY WITH PROPOFOL N/A 01/18/2017   Procedure: COLONOSCOPY WITH PROPOFOL;  Surgeon: Christena Deem, MD;  Location: [facility] ENDOSCOPY;  Service: Endoscopy;  Laterality: N/A;  . CORONARY ARTERY BYPASS GRAFT     2008 2 vessels  . CORONARY ARTERY BYPASS GRAFT  2008  . ESOPHAGOGASTRODUODENOSCOPY (EGD) WITH PROPOFOL N/A 05/16/2018   Procedure: ESOPHAGOGASTRODUODENOSCOPY (EGD) WITH PROPOFOL;  Surgeon: Christena Deem, MD;  Location: [facility] ENDOSCOPY;  Service: Endoscopy;  Laterality: N/A;  . FRACTURE SURGERY    . HERNIA REPAIR  1987   incisional  . JOINT REPLACEMENT Right 2017   knee  . ORIF ANKLE FRACTURE Left 06/19/2016   Procedure: OPEN REDUCTION INTERNAL FIXATION (ORIF) ANKLE FRACTURE;  Surgeon: Juanell Fairly, MD;  Location: [facility] ORS;  Service: Orthopedics;  Laterality: Left;  . rcr    . ROTATOR CUFF REPAIR Left 2013  . TONSILLECTOMY    . TOTAL KNEE ARTHROPLASTY Right 12/19/2015   Procedure: TOTAL KNEE ARTHROPLASTY;  Surgeon: Christena Flake, MD;  Location: [facility] ORS;  Service: Orthopedics;  Laterality: Right;  . TUBAL LIGATION    . VISCERAL ANGIOGRAPHY N/A 05/23/2018   Procedure: VISCERAL ANGIOGRAPHY;  Surgeon: Annice Needy, MD;  Location: [facility] INVASIVE CV LAB;  Service: Cardiovascular;  Laterality: N/A;    Social History Social History   Tobacco Use  . Smoking status: Never Smoker  . Smokeless tobacco: Never Used  Substance Use Topics  . Alcohol use: No  . Drug use: No    Family History Family History  Problem Relation Age of Onset  . Breast cancer Maternal Aunt 30  . Heart disease Mother   . Heart attack Mother   . Heart disease Father   . Heart attack Father   . Diabetes Father     Allergies  Allergen Reactions   . Morphine And Related Nausea And Vomiting    Severe n & v. Any other pain medications are okay  . Penicillins Hives    Has patient had a PCN reaction causing immediate rash, facial/tongue/throat swelling, SOB or lightheadedness with hypotension: No Has patient had a PCN reaction causing severe rash involving mucus membranes or skin necrosis: No Has patient had a PCN reaction that required hospitalization No Has patient had a PCN reaction occurring within the last 10 years: No If all of the  above answers are "NO", then may proceed with Cephalosporin use.      REVIEW OF SYSTEMS (Negative unless checked)  Constitutional: [] Weight loss  [] Fever  [] Chills Cardiac: [] Chest pain   [] Chest pressure   [] Palpitations   [] Shortness of breath when laying flat   [] Shortness of breath at rest   [x] Shortness of breath with exertion. Vascular:  [] Pain in legs with walking   [] Pain in legs at rest   [] Pain in legs when laying flat   [] Claudication   [] Pain in feet when walking  [] Pain in feet at rest  [] Pain in feet when laying flat   [] History of DVT   [] Phlebitis   [] Swelling in legs   [] Varicose veins   [] Non-healing ulcers Pulmonary:   [] Uses home oxygen   [] Productive cough   [] Hemoptysis   [] Wheeze  [] COPD   [x] Asthma Neurologic:  [] Dizziness  [] Blackouts   [] Seizures   [] History of stroke   [] History of TIA  [] Aphasia   [] Temporary blindness   [] Dysphagia   [] Weakness or numbness in arms   [] Weakness or numbness in legs Musculoskeletal:  [x] Arthritis   [] Joint swelling   [x] Joint pain   [] Low back pain Hematologic:  [] Easy bruising  [] Easy bleeding   [] Hypercoagulable state   [] Anemic   Gastrointestinal:  [x] Blood in stool   [] Vomiting blood  [x] Gastroesophageal reflux/heartburn   [x] Abdominal pain Genitourinary:  [] Chronic kidney disease   [] Difficult urination  [] Frequent urination  [] Burning with urination   [] Hematuria Skin:  [] Rashes   [] Ulcers   [] Wounds Psychological:  [x] History of anxiety    []  History of major depression.  Physical Examination  BP (!) 144/74 (BP Location: Right Arm)   Pulse 64   Resp 16   Ht 5' (1.524 m)   Wt 171 lb 3.2 oz (77.7 kg)   BMI 33.44 kg/m  Gen:  WD/WN, NAD Head: [place]/AT, No temporalis wasting. Ear/Nose/Throat: Hearing grossly intact, nares w/o erythema or drainage Eyes: Conjunctiva clear. Sclera non-icteric Neck: Supple.  Trachea midline Pulmonary:  Good air movement, no use of accessory muscles.  Cardiac: RRR, no JVD Vascular:  Vessel Right Left  Radial Palpable Palpable                                   Gastrointestinal: soft, non-tender/non-distended. No guarding/reflex.  Musculoskeletal: M/S 5/5 throughout.  No deformity or atrophy. No edema. Neurologic: Sensation grossly intact in extremities.  Symmetrical.  Speech is fluent.  Psychiatric: Judgment intact, Mood & affect appropriate for pt's clinical situation. Dermatologic: No rashes or ulcers noted.  No cellulitis or open wounds.       Labs Recent Results (from the past 2160 hour(s))  Glucose, capillary     Status: Abnormal   Collection Time: 05/16/18  8:28 AM  Result Value Ref Range   Glucose-Capillary 138 (H) 70 - 99 mg/dL  Surgical pathology     Status: None   Collection Time: 05/16/18  9:47 AM  Result Value Ref Range   SURGICAL PATHOLOGY      Surgical Pathology * THIS IS AN ADDENDUM REPORT * CASE: [id number] PATIENT: Chales AbrahamsJUDY Petrov Surgical Pathology Report **Addendum *  Reason for Addendum #1:  Additional special stains  SPECIMEN SUBMITTED: A. Duodenum; cbx B. Stomach, antrum, body; cbx C. Stomach nodule, body, post submucosal; cbx D. GEJ; cbx  CLINICAL HISTORY: None provided  PRE-OPERATIVE DIAGNOSIS: Dyspepsia  POST-OPERATIVE DIAGNOSIS: Small sliding hiatal hernia, mild erosive  gastritis antrum     DIAGNOSIS: A. DUODENUM; BIOPSY: - NEGATIVE FOR VILLOUS ATROPHY, INCREASED INTRAEPITHELIAL LYMPHOCYTES AND ORGANISMS.  B.  STOMACH, ANTRUM,  BODY; BIOPSIES: - BENIGN ANTRAL AND OXYNTIC MUCOSA. - NEGATIVE FOR ACTIVE MUCOSAL INFLAMMATION, H. PYLORI, INTESTINAL METAPLASIA AND DYSPLASIA.  C. STOMACH, BODY, POSTERIOR SUBMUCOSAL NODULE; BIOPSY: - BENIGN OXYNTIC MUCOSA.  NO SIGNIFICANT SUBMUCOSAL TISSUE PRESENT FOR EVALUATION ON INITIAL LEVELS OF SECTIONING. - DEEPER TISSUE SE CTIONS WILL BE OBTAINED AND THE FINDINGS REPORTED IN AN ADDENDUM.  D. GE JUNCTION; BIOPSY: BENIGN GE JUNCTION MUCOSA WITH FEATURES CONSISTENT WITH GASTROESOPHAGEAL REFLUX. - NEGATIVE FOR INTESTINAL METAPLASIA, DYSPLASIA AND MALIGNANCY.   GROSS DESCRIPTION: A. Labeled: C BX duodenum Received: Formalin Tissue fragment(s): 2 Size: 0.2-0.3 cm Description: Tan soft tissue fragments Entirely submitted in 1 cassette.  B. Labeled: C BX antrum and body stomach Received: Formalin Tissue fragment(s): Multiple Size: Aggregate, 1.0 x 0.4 x 0.1 cm Description: Tan soft tissue fragments Entirely submitted in 1 cassette.  C. Labeled: C BX submucosal nodule post gastric body Received: Formalin Tissue fragment(s): 1 Size: 0.3 cm Description: Tan soft tissue fragment Entirely submitted in 1 cassette.  D. Labeled: C BX GE junction Received: Formalin Tissue fragment(s): 2 Size: 0.2-0.3 cm Description: Tan soft tissue fragments Entirely submitted in 1 cassette.   Final  Diagnosis performed by Redmond Pulling, MD.   Electronically signed 05/17/2018 1:43:34PM The electronic signature indicates that the named Attending Pathologist has evaluated the specimen  Technical component performed at [facility], [address] [place], [place] [address] Lab: [phone] Dir: Jolene Schimke, MD, MMM  Professional component performed at [facility], [facility], [address], [place], [place] [address] Lab: [phone] Dir: Georgiann Cocker. Rubinas, MD  ADDENDUM: C. Deeper levels of sectioning failed to reveal significant additional tissue to evaluate for a  submucosal lesion.        Addendum #1 performed by Redmond Pulling, MD.   Electronically signed 05/18/2018 8:13:26AM The electronic signature indicates that the named Attending Pathologist has evaluated the specimen  Technical component performed at [facility], [address], [place], [place] [address] Lab: [phone] Dir: Jolene Schimke, MD, MMM  Professional component performed at [facility],  [facility], [address] [place], [place], [place] [address] Lab: [phone] Dir: Georgiann Cocker. Rubinas, MD   Glucose, capillary     Status: Abnormal   Collection Time: 05/23/18 11:33 AM  Result Value Ref Range   Glucose-Capillary 171 (H) 70 - 99 mg/dL  BUN     Status: Abnormal   Collection Time: 05/23/18 11:45 AM  Result Value Ref Range   BUN 28 (H) 8 - 23 mg/dL    Comment: Performed at [facility], [address] [place], [place] [address]  Creatinine, serum     Status: Abnormal   Collection Time: 05/23/18 11:45 AM  Result Value Ref Range   Creatinine, Ser 1.15 (H) 0.44 - 1.00 mg/dL   GFR calc non Af Amer 47 (L) >60 mL/min   GFR calc Af Amer 55 (L) >60 mL/min    Comment: Performed at [facility], [address] [place], [place] [address]  Glucose, capillary     Status: Abnormal   Collection Time: 05/23/18  1:56 PM  Result Value Ref Range   Glucose-Capillary 139 (H) 70 - 99 mg/dL    Radiology No results found.  Assessment/Plan  HLD (hyperlipidemia) lipid control important in reducing the progression of atherosclerotic disease.    HTN (hypertension) blood pressure control important in reducing the progression  of atherosclerotic disease. On appropriate oral medications.   Diabetes (HCC) blood glucose control important in reducing the progression of atherosclerotic disease. Also, involved in wound healing. On appropriate medications.   Chronic mesenteric ischemia (HCC) Her duplex today shows her SMA stent is patent although the velocities  remain somewhat elevated although significantly improved.  Her celiac artery velocities remain markedly elevated consistent with a high-grade stenosis seen on angiography a few weeks ago. Symptomatically, she is not that much better.  At this point, I think considering celiac artery intervention as well would be the most prudent option.  Given all of the current issues with the coronavirus we are going to table that for 6 to 8 weeks as long as she can tolerate it clinically.  If her symptoms become dramatically worse, she will contact our office and we will do this sooner.    Festus Barren, MD  06/21/2018 10:21 AM    This note was created with Dragon medical transcription system.  Any errors from dictation are purely unintentional

## 2018-06-21 NOTE — Assessment & Plan Note (Signed)
blood pressure control important in reducing the progression of atherosclerotic disease. On appropriate oral medications.  

## 2018-06-22 ENCOUNTER — Encounter (INDEPENDENT_AMBULATORY_CARE_PROVIDER_SITE_OTHER): Payer: Self-pay

## 2018-06-22 ENCOUNTER — Telehealth (INDEPENDENT_AMBULATORY_CARE_PROVIDER_SITE_OTHER): Payer: Self-pay

## 2018-06-22 NOTE — Telephone Encounter (Signed)
Patient has been scheduled for her mesenteric angio with Dr. Wyn Quaker on 08/18/2018 with a 6:45 am arrival time. This information will be mailed out to the patient.

## 2018-07-10 ENCOUNTER — Other Ambulatory Visit (INDEPENDENT_AMBULATORY_CARE_PROVIDER_SITE_OTHER): Payer: Self-pay | Admitting: Vascular Surgery

## 2018-08-02 ENCOUNTER — Telehealth (INDEPENDENT_AMBULATORY_CARE_PROVIDER_SITE_OTHER): Payer: Self-pay

## 2018-08-02 NOTE — Telephone Encounter (Signed)
A message was left and I returned the call. The patient wanted to cancel her mesenteric angio for 08/18/2018. Per the patient "she is feeling better so she does not need it". This procedure was canceled.

## 2018-08-03 ENCOUNTER — Other Ambulatory Visit (INDEPENDENT_AMBULATORY_CARE_PROVIDER_SITE_OTHER): Payer: Self-pay | Admitting: Vascular Surgery

## 2018-08-10 DIAGNOSIS — M810 Age-related osteoporosis without current pathological fracture: Secondary | ICD-10-CM | POA: Insufficient documentation

## 2018-08-10 DIAGNOSIS — Z780 Asymptomatic menopausal state: Secondary | ICD-10-CM | POA: Insufficient documentation

## 2018-08-15 ENCOUNTER — Other Ambulatory Visit (INDEPENDENT_AMBULATORY_CARE_PROVIDER_SITE_OTHER): Payer: Self-pay | Admitting: Nurse Practitioner

## 2018-08-18 ENCOUNTER — Ambulatory Visit: Admit: 2018-08-18 | Payer: Medicare Other | Admitting: Vascular Surgery

## 2018-08-18 DIAGNOSIS — K551 Chronic vascular disorders of intestine: Principal | ICD-10-CM

## 2018-08-18 SURGERY — VISCERAL ANGIOGRAPHY
Anesthesia: Moderate Sedation

## 2018-09-01 ENCOUNTER — Other Ambulatory Visit (INDEPENDENT_AMBULATORY_CARE_PROVIDER_SITE_OTHER): Payer: Self-pay | Admitting: Vascular Surgery

## 2018-10-06 ENCOUNTER — Other Ambulatory Visit (INDEPENDENT_AMBULATORY_CARE_PROVIDER_SITE_OTHER): Payer: Self-pay | Admitting: Vascular Surgery

## 2018-11-11 ENCOUNTER — Other Ambulatory Visit (INDEPENDENT_AMBULATORY_CARE_PROVIDER_SITE_OTHER): Payer: Self-pay | Admitting: Vascular Surgery

## 2018-11-29 ENCOUNTER — Other Ambulatory Visit: Payer: Self-pay | Admitting: Physician Assistant

## 2018-11-29 DIAGNOSIS — Z1231 Encounter for screening mammogram for malignant neoplasm of breast: Secondary | ICD-10-CM

## 2018-12-06 ENCOUNTER — Other Ambulatory Visit (INDEPENDENT_AMBULATORY_CARE_PROVIDER_SITE_OTHER): Payer: Self-pay | Admitting: Vascular Surgery

## 2018-12-17 ENCOUNTER — Other Ambulatory Visit (INDEPENDENT_AMBULATORY_CARE_PROVIDER_SITE_OTHER): Payer: Self-pay | Admitting: Vascular Surgery

## 2019-01-04 ENCOUNTER — Ambulatory Visit
Admission: RE | Admit: 2019-01-04 | Discharge: 2019-01-04 | Disposition: A | Discharge status: Home / Self Care | Payer: Medicare Other | Source: Ambulatory Visit | Attending: Physician Assistant | Admitting: Physician Assistant

## 2019-01-04 DIAGNOSIS — Z1231 Encounter for screening mammogram for malignant neoplasm of breast: Secondary | ICD-10-CM | POA: Insufficient documentation

## 2019-05-19 ENCOUNTER — Ambulatory Visit: Payer: Medicare Other | Attending: Internal Medicine

## 2019-05-19 DIAGNOSIS — Z23 Encounter for immunization: Secondary | ICD-10-CM | POA: Insufficient documentation

## 2019-05-19 NOTE — Progress Notes (Signed)
   Covid-19 Vaccination Clinic  Name:  Alexandria Marquez    MRN: [id number] DOB: 1945-11-25  05/19/2019  Alexandria Marquez was observed post Covid-19 immunization for 15 minutes without incidence. She was provided with Vaccine Information Sheet and instruction to access the V-Safe system.   Alexandria Marquez was instructed to call 911 with any severe reactions post vaccine: Marland Kitchen Difficulty breathing  . Swelling of your face and throat  . A fast heartbeat  . A bad rash all over your body  . Dizziness and weakness    Immunizations Administered    Name Date Dose VIS Date Route   Pfizer COVID-19 Vaccine 05/19/2019 12:50 PM 0.3 mL 03/03/2019 Intramuscular   Manufacturer: [employer], [employer]   Lot: [id number]   NDC: [address]-7

## 2019-06-02 ENCOUNTER — Ambulatory Visit (INDEPENDENT_AMBULATORY_CARE_PROVIDER_SITE_OTHER): Payer: Medicare Other | Admitting: Vascular Surgery

## 2019-06-02 ENCOUNTER — Encounter (INDEPENDENT_AMBULATORY_CARE_PROVIDER_SITE_OTHER): Payer: Self-pay | Admitting: Vascular Surgery

## 2019-06-02 ENCOUNTER — Other Ambulatory Visit: Payer: Self-pay

## 2019-06-02 VITALS — BP 113/70 | HR 63 | Resp 16 | Wt 169.6 lb

## 2019-06-02 DIAGNOSIS — E119 Type 2 diabetes mellitus without complications: Secondary | ICD-10-CM | POA: Diagnosis not present

## 2019-06-02 DIAGNOSIS — I10 Essential (primary) hypertension: Secondary | ICD-10-CM

## 2019-06-02 DIAGNOSIS — K551 Chronic vascular disorders of intestine: Secondary | ICD-10-CM

## 2019-06-02 DIAGNOSIS — E785 Hyperlipidemia, unspecified: Secondary | ICD-10-CM | POA: Diagnosis not present

## 2019-06-02 DIAGNOSIS — M25519 Pain in unspecified shoulder: Secondary | ICD-10-CM | POA: Insufficient documentation

## 2019-06-02 DIAGNOSIS — S42293A Other displaced fracture of upper end of unspecified humerus, initial encounter for closed fracture: Secondary | ICD-10-CM | POA: Insufficient documentation

## 2019-06-02 DIAGNOSIS — M542 Cervicalgia: Secondary | ICD-10-CM | POA: Insufficient documentation

## 2019-06-02 DIAGNOSIS — M47812 Spondylosis without myelopathy or radiculopathy, cervical region: Secondary | ICD-10-CM | POA: Insufficient documentation

## 2019-06-02 DIAGNOSIS — M25619 Stiffness of unspecified shoulder, not elsewhere classified: Secondary | ICD-10-CM | POA: Insufficient documentation

## 2019-06-02 NOTE — Patient Instructions (Signed)
Chronic Mesenteric Ischemia  Chronic mesenteric ischemia is poor blood flow (circulation) in the vessels that supply blood to the stomach, intestines, and liver (mesenteric organs). When the blood supply is severely restricted, these organs cannot work properly. This condition is also called mesenteric angina, or intestinal angina. This condition is a long-term (chronic) condition. It happens when an artery or vein that provides blood to the mesenteric organs gradually becomes blocked or narrows over time, restricting the blood supply to these organs. What are the causes? This condition is commonly caused by fatty deposits that build up in an artery (plaque), which can narrow the artery and restrict blood flow. Other causes include:  Weakened areas in blood vessel walls (aneurysms).  Conditions that cause twisting or inflammation of blood vessels, such as fibromuscular dysplasia or arteritis.  A disorder in which blood clots form in the veins (venous thrombosis).  Scarring and thickening (fibrosis) of blood vessels caused by radiation therapy.  A tear in the aorta, the body's main artery (aortic dissection).  Blood vessel problems after illegal drug use, such as use of cocaine.  Tumors in the nervous system (neurofibromatosis).  Certain autoimmune diseases, such as lupus. What increases the risk? The following factors may make you more likely to develop this condition:  Being female.  Being over age 50, especially if you have a history of heart problems.  Smoking.  Having congestive heart failure.  Having an irregular heartbeat (arrhythmia).  Having a history of heart attack or stroke.  Having diabetes.  Having high cholesterol.  Having high blood pressure (hypertension).  Being overweight or obese.  Having kidney disease (renal disease) that requires dialysis. What are the signs or symptoms? Symptoms of this condition include:  Pain or cramps in the abdomen that  develop 15-60 minutes after a meal. This pain may last for 1-3 hours. Some people may develop a fear of eating because of this symptom.  Weight loss.  Diarrhea.  Bloody stool.  Nausea.  Vomiting.  Bloating.  Abdominal pain after stress or with exercise. How is this diagnosed? This condition is diagnosed based on:  Your medical history.  A physical exam.  Tests, such as: ? Ultrasound. ? CT scan. ? Blood tests. ? Urine tests. ? An imaging test that involves injecting a dye into your arteries to show blood flow through blood vessels (angiogram). This can help to show if there are any blockages in the vessels that lead to the intestines. ? Passing a small probe through the mouth and into the stomach to measure the output of carbon dioxide (gastric tonometry). This can help to indicate whether there is decreased blood flow to the stomach and intestines. How is this treated? This condition may be treated with:  Dietary changes such as eating smaller, low-fat, meals more frequently.  Lifestyle changes to treat underlying conditions that contribute to the disease, such as high cholesterol and high blood pressure.  Medicines to reduce blood clotting and increase blood flow.  Surgery to remove the blockage, repair arteries or veins, and restore blood flow. This may involve: ? Angioplasty. This is surgery to widen the affected artery, reduce the blockage, and sometimes insert a small, mesh tube (stent). ? Bypass surgery. This may be done to go around (bypass) the blockage and reconnect healthy arteries or veins. ? Placing a stent in the affected area. This may be done to help keep blocked arteries open. Follow these instructions at home: Eating and drinking   Eat a heart-healthy diet. This   includes fresh fruits and vegetables, whole grains, and lean proteins like chicken, fish, and beans.  Avoid foods that contain a lot of: ? Salt (sodium). ? Sugar. ? Saturated fat (such as  red meat). ? Trans fat (such as in fried foods).  Stay hydrated. Drink enough fluid to keep your urine pale yellow. Lifestyle  Stay active and get regular exercise as told by your health care provider. Aim for 150 minutes of moderate activity or 75 minutes of vigorous activity a week. Ask your health care provider what activities and forms of exercise are safe for you.  Maintain a healthy weight.  Work with your health care provider to manage your cholesterol.  Manage any other health problems you have, such as high blood pressure, diabetes, or heart rhythm problems.  Do not use any products that contain nicotine or tobacco, such as cigarettes, e-cigarettes, and chewing tobacco. If you need help quitting, ask your health care provider. General instructions  Take over-the-counter and prescription medicines only as told by your health care provider.  Keep all follow-up visits as told by your health care provider. This is important.  You may need to take actions to prevent or treat constipation, such as: ? Drink enough fluid to keep your urine pale yellow. ? Take over-the-counter or prescription medicines. ? Eat foods that are high in fiber, such as beans, whole grains, and fresh fruits and vegetables. ? Limit foods that are high in fat and processed sugars, such as fried or sweet foods. Contact a health care provider if:  Your symptoms do not improve or they return after treatment.  You have a fever.  You are constipated. Get help right away if you:  Have severe abdominal pain.  Have severe chest pain.  Have shortness of breath.  Feel weak or dizzy.  Have fast or irregular heartbeats (palpitations).  Have numbness or weakness in your face, arm, or leg.  Are confused.  Have trouble speaking or people have trouble understanding what you are saying.  Have trouble urinating.  Have blood in your stool.  Have severe nausea, vomiting, or persistent diarrhea. These  symptoms may represent a serious problem that is an emergency. Do not wait to see if the symptoms will go away. Get medical help right away. Call your local emergency services (911 in the [place] Do not drive yourself to the hospital. Summary  Mesenteric ischemia is poor circulation in the vessels that supply blood to the the stomach, intestines, and liver (mesenteric organs).  This condition happens when an artery or vein that provides blood to the mesenteric organs gradually becomes blocked or narrow, restricting the blood supply to the organs.  This condition is commonly caused by fatty deposits that build up in an artery (plaque), which can narrow the artery and restrict blood flow.  You are more likely to develop this condition if you are over age 50 and have a history of heart problems, high blood pressure, diabetes, or high cholesterol.  This condition is usually treated with medicines, dietary and lifestyle changes, and surgery to remove the blockage, repair arteries or veins, and restore blood flow. This information is not intended to replace advice given to you by your health care provider. Make sure you discuss any questions you have with your health care provider. Document Revised: 11/12/2017 Document Reviewed: 11/12/2017 Elsevier Patient Education  2020 [employer]  

## 2019-06-02 NOTE — Progress Notes (Signed)
MRN : [id number]  Alexandria Marquez is a 74 y.o. ([birth year]) female who presents with chief complaint of  Chief Complaint  Patient presents with   Follow-up    ref [facility] mesenteric ischemia  .  History of Present Illness: Patient returns today in follow up of of her chronic visceral ischemia.  Almost a year ago, we had discussed performing a mesenteric angiogram to treat a known celiac artery stenosis after a previous treatment of an SMA stenosis with residual symptoms.  That was at the beginning of Covid, elective procedures were put on hold and she understandably was anxious about having this done at that time.  She continues to be bothered by postprandial nausea, diarrhea, and intermittent abdominal pain.  She has lost 2 pounds from when she was last seen.  Her previous angiogram had demonstrated celiac artery stenosis and some elevated velocities within her previously placed SMA stent, and that was the last imaging study almost a year ago.  Current Outpatient Medications  Medication Sig Dispense Refill   amLODipine (NORVASC) 5 MG tablet Take 5 mg by mouth every evening.     aspirin 81 MG tablet Take 1 tablet (81 mg total) by mouth daily. 30 tablet 0   atorvastatin (LIPITOR) 40 MG tablet Take 40 mg by mouth at bedtime.      bisoprolol (ZEBETA) 5 MG tablet Take 5 mg by mouth daily.     enalapril (VASOTEC) 2.5 MG tablet Take 2.5 mg by mouth 2 (two) times daily.     fluticasone (FLONASE) 50 MCG/ACT nasal spray Place 1-2 sprays into both nostrils daily as needed for allergies.      gabapentin (NEURONTIN) 300 MG capsule Take 600 mg by mouth at bedtime.     glimepiride (AMARYL) 2 MG tablet Take 1 tablet (2 mg total) by mouth 2 (two) times daily. 60 tablet 0   glucose blood (ONETOUCH ULTRA) test strip USE 2 (TWO) TIMES DAILY. USE AS INSTRUCTED.     hydrochlorothiazide (HYDRODIURIL) 25 MG tablet Take 12.5 mg by mouth daily.     insulin detemir (LEVEMIR) 100 UNIT/ML injection Inject 25  Units into the skin daily.     Insulin Pen Needle (FIFTY50 PEN NEEDLES) 31G X 5 MM MISC Use as directed     levothyroxine (SYNTHROID, LEVOTHROID) 75 MCG tablet Take 75 mcg by mouth daily before breakfast.     metFORMIN (GLUCOPHAGE) 1000 MG tablet Take 1,000 mg by mouth 2 (two) times daily with a meal.     ondansetron (ZOFRAN-ODT) 4 MG disintegrating tablet Take 4 mg by mouth every 8 (eight) hours as needed for nausea or vomiting.     pantoprazole (PROTONIX) 40 MG tablet Take 40 mg by mouth 2 (two) times daily.     PLAVIX 75 MG tablet TAKE 1 TABLET BY MOUTH EVERY DAY 90 tablet 1   Polyethyl Glycol-Propyl Glycol (SYSTANE) 0.4-0.3 % SOLN Place 1 drop into both eyes 3 (three) times daily as needed (for dry eyes).     sertraline (ZOLOFT) 100 MG tablet Take 100 mg by mouth daily.     tiZANidine (ZANAFLEX) 2 MG tablet Take 2 mg by mouth every 6 (six) hours as needed for muscle spasms.     Vitamin D, Ergocalciferol, (DRISDOL) 1.25 MG (50000 UNIT) CAPS capsule Take 50,000 Units by mouth once a week.     ciprofloxacin (CIPRO) 500 MG tablet Take 1 tablet (500 mg total) by mouth 2 (two) times daily. X 8 days (Patient not taking:  Reported on 02/02/2017) 16 tablet 0   dicyclomine (BENTYL) 10 MG capsule Take 1 capsule (10 mg total) by mouth 3 (three) times daily before meals. X 10 days (Patient not taking: Reported on 02/02/2017) 30 capsule 0   DULoxetine (CYMBALTA) 60 MG capsule Take 60 mg by mouth at bedtime.      meloxicam (MOBIC) 7.5 MG tablet Take 7.5 mg by mouth daily.     metroNIDAZOLE (FLAGYL) 500 MG tablet Take 1 tablet (500 mg total) by mouth every 8 (eight) hours. X 8 days (Patient not taking: Reported on 05/16/2018) 24 tablet 0   Saccharomyces boulardii (PROBIOTIC) 250 MG CAPS Take 1 capsule by mouth daily. X 2 weeks (Patient not taking: Reported on 05/16/2018) 14 capsule 0   traMADol (ULTRAM) 50 MG tablet Take 1 tablet (50 mg total) by mouth every 6 (six) hours as needed for moderate  pain or severe pain. (Patient not taking: Reported on 02/02/2017) 20 tablet 0   No current facility-administered medications for this visit.    Past Medical History:  Diagnosis Date   Anxiety    Arthritis    Asthma    when young   Coronary artery disease    Depression    Diabetes mellitus without complication (HCC)    Environmental and seasonal allergies    GERD (gastroesophageal reflux disease)    History of hiatal hernia    Hypertension    Hypothyroidism    Lower extremity edema    Motion sickness    OSA (obstructive sleep apnea) 06/04/2014   Scoliosis    Shortness of breath dyspnea    doe    Past Surgical History:  Procedure Laterality Date   ABDOMINAL HYSTERECTOMY     APPENDECTOMY     BACK SURGERY  2014   lumbar fusion/plate/rods/screws   CHOLECYSTECTOMY  2005   COLONOSCOPY WITH PROPOFOL N/A 01/18/2017   Procedure: COLONOSCOPY WITH PROPOFOL;  Surgeon: Christena Deem, MD;  Location: [facility] ENDOSCOPY;  Service: Endoscopy;  Laterality: N/A;   CORONARY ARTERY BYPASS GRAFT     2008 2 vessels   CORONARY ARTERY BYPASS GRAFT  2008   ESOPHAGOGASTRODUODENOSCOPY (EGD) WITH PROPOFOL N/A 05/16/2018   Procedure: ESOPHAGOGASTRODUODENOSCOPY (EGD) WITH PROPOFOL;  Surgeon: Christena Deem, MD;  Location: [facility] ENDOSCOPY;  Service: Endoscopy;  Laterality: N/A;   FRACTURE SURGERY     HERNIA REPAIR  1987   incisional   JOINT REPLACEMENT Right 2017   knee   ORIF ANKLE FRACTURE Left 06/19/2016   Procedure: OPEN REDUCTION INTERNAL FIXATION (ORIF) ANKLE FRACTURE;  Surgeon: Juanell Fairly, MD;  Location: [facility] ORS;  Service: Orthopedics;  Laterality: Left;   rcr     ROTATOR CUFF REPAIR Left 2013   TONSILLECTOMY     TOTAL KNEE ARTHROPLASTY Right 12/19/2015   Procedure: TOTAL KNEE ARTHROPLASTY;  Surgeon: Christena Flake, MD;  Location: [facility] ORS;  Service: Orthopedics;  Laterality: Right;   TUBAL LIGATION     VISCERAL ANGIOGRAPHY N/A 05/23/2018   Procedure:  VISCERAL ANGIOGRAPHY;  Surgeon: Annice Needy, MD;  Location: [facility] INVASIVE CV LAB;  Service: Cardiovascular;  Laterality: N/A;     Social History   Tobacco Use   Smoking status: Never Smoker   Smokeless tobacco: Never Used  Substance Use Topics   Alcohol use: No   Drug use: No    Family History  Problem Relation Age of Onset   Breast cancer Maternal Aunt 30   Heart disease Mother    Heart attack Mother  Heart disease Father    Heart attack Father    Diabetes Father    Breast cancer Paternal Grandmother      Allergies  Allergen Reactions   Simethicone Anaphylaxis   Morphine And Related Nausea And Vomiting    Severe n & v. Any other pain medications are okay   Penicillins Hives    Has patient had a PCN reaction causing immediate rash, facial/tongue/throat swelling, SOB or lightheadedness with hypotension: No Has patient had a PCN reaction causing severe rash involving mucus membranes or skin necrosis: No Has patient had a PCN reaction that required hospitalization No Has patient had a PCN reaction occurring within the last 10 years: No If all of the above answers are "NO", then may proceed with Cephalosporin use.      REVIEW OF SYSTEMS (Negative unless checked)  Constitutional: [] ?Weight loss  [] ?Fever  [] ?Chills Cardiac: [] ?Chest pain   [] ?Chest pressure   [] ?Palpitations   [] ?Shortness of breath when laying flat   [] ?Shortness of breath at rest   [x] ?Shortness of breath with exertion. Vascular:  [] ?Pain in legs with walking   [] ?Pain in legs at rest   [] ?Pain in legs when laying flat   [] ?Claudication   [] ?Pain in feet when walking  [] ?Pain in feet at rest  [] ?Pain in feet when laying flat   [] ?History of DVT   [] ?Phlebitis   [] ?Swelling in legs   [] ?Varicose veins   [] ?Non-healing ulcers Pulmonary:   [] ?Uses home oxygen   [] ?Productive cough   [] ?Hemoptysis   [] ?Wheeze  [] ?COPD   [x] ?Asthma Neurologic:  [] ?Dizziness  [] ?Blackouts   [] ?Seizures    [] ?History of stroke   [] ?History of TIA  [] ?Aphasia   [] ?Temporary blindness   [] ?Dysphagia   [] ?Weakness or numbness in arms   [] ?Weakness or numbness in legs Musculoskeletal:  [x] ?Arthritis   [] ?Joint swelling   [x] ?Joint pain   [] ?Low back pain Hematologic:  [] ?Easy bruising  [] ?Easy bleeding   [] ?Hypercoagulable state   [] ?Anemic   Gastrointestinal:  [x] ?Blood in stool   [] ?Vomiting blood  [x] ?Gastroesophageal reflux/heartburn   [x] ?Abdominal pain Genitourinary:  [] ?Chronic kidney disease   [] ?Difficult urination  [] ?Frequent urination  [] ?Burning with urination   [] ?Hematuria Skin:  [] ?Rashes   [] ?Ulcers   [] ?Wounds Psychological:  [x] ?History of anxiety   [] ? History of major depression.  Physical Examination  BP 113/70 (BP Location: Left Arm)    Pulse 63    Resp 16    Wt 169 lb 9.6 oz (76.9 kg)    BMI 33.12 kg/m  Gen:  WD/WN, NAD Head: [place]/AT, No temporalis wasting. Ear/Nose/Throat: Hearing grossly intact, nares w/o erythema or drainage Eyes: Conjunctiva clear. Sclera non-icteric Neck: Supple.  Trachea midline Pulmonary:  Good air movement, no use of accessory muscles.  Cardiac: RRR, no JVD Vascular:  Vessel Right Left  Radial Palpable Palpable                                   Gastrointestinal: soft, non-tender/non-distended. No guarding/reflex.  Musculoskeletal: M/S 5/5 throughout.  No deformity or atrophy.  No lower extremity edema. Neurologic: Sensation grossly intact in extremities.  Symmetrical.  Speech is fluent.  Psychiatric: Judgment intact, Mood & affect appropriate for pt's clinical situation. Dermatologic: No rashes or ulcers noted.  No cellulitis or open wounds.       Labs No results found for this or any previous visit (from the past 2160 hour(s)).  Radiology No results found.  Assessment/Plan HLD (hyperlipidemia) lipid control important in reducing the progression of atherosclerotic disease.    HTN (hypertension) blood pressure control  important in reducing the progression of atherosclerotic disease. On appropriate oral medications.   Diabetes (HCC) blood glucose control important in reducing the progression of atherosclerotic disease. Also, involved in wound healing. On appropriate medications.  Chronic mesenteric ischemia (HCC)  Her previous angiogram had demonstrated celiac artery stenosis and some elevated velocities within her previously placed SMA stent, and that was the last imaging study almost a year ago. She continues to have symptoms that are not entirely classic for chronic visceral ischemia, but certainly worrisome for such.  She had only mild improvement after her SMA intervention.  I think proceeding with a mesenteric angiogram at this point would be a reasonable option with evaluation of both her previously placed SMA stent as well as the celiac artery.  I have discussed the reason and rationale for treatment of the visceral vessels.  I discussed that endovascular therapy has become the preferred therapy over the years.  She voices her understanding and is agreeable to proceed with mesenteric angiogram.  She would like to delay this until after she gets her second dose of the Covid vaccination later this month and that is certainly reasonable.    Festus Barren, MD  06/02/2019 12:05 PM    This note was created with Dragon medical transcription system.  Any errors from dictation are purely unintentional

## 2019-06-02 NOTE — Assessment & Plan Note (Signed)
Her previous angiogram had demonstrated celiac artery stenosis and some elevated velocities within her previously placed SMA stent, and that was the last imaging study almost a year ago. She continues to have symptoms that are not entirely classic for chronic visceral ischemia, but certainly worrisome for such.  She had only mild improvement after her SMA intervention.  I think proceeding with a mesenteric angiogram at this point would be a reasonable option with evaluation of both her previously placed SMA stent as well as the celiac artery.  I have discussed the reason and rationale for treatment of the visceral vessels.  I discussed that endovascular therapy has become the preferred therapy over the years.  She voices her understanding and is agreeable to proceed with mesenteric angiogram.  She would like to delay this until after she gets her second dose of the Covid vaccination later this month and that is certainly reasonable.

## 2019-06-06 ENCOUNTER — Telehealth (INDEPENDENT_AMBULATORY_CARE_PROVIDER_SITE_OTHER): Payer: Self-pay

## 2019-06-06 NOTE — Telephone Encounter (Signed)
Spoke with the patient and she is now scheduled with Dr. Wyn Quaker for a mesenteric angio on 07/03/19 with a 6:45 am arrival time to the [facility]. Patient will do covid testing on 06/30/19 between 8-1 pm at the [facility]. Pre-procedure instructions will be mailed to the patient. Patient was offered a sooner appt but declined because she wanted to wait until she completed her covid vaccination.

## 2019-06-20 ENCOUNTER — Ambulatory Visit: Payer: Medicare Other | Attending: Internal Medicine

## 2019-06-20 DIAGNOSIS — Z23 Encounter for immunization: Secondary | ICD-10-CM

## 2019-06-20 NOTE — Progress Notes (Signed)
   Covid-19 Vaccination Clinic  Name:  Alexandria Marquez    MRN: [id number] DOB: [birth year]  06/20/2019  Ms. Alexandria Marquez was observed post Covid-19 immunization for 30 minutes based on pre-vaccination screening without incident. She was provided with Vaccine Information Sheet and instruction to access the V-Safe system.   Ms. Alexandria Marquez was instructed to call 911 with any severe reactions post vaccine: Marland Kitchen Difficulty breathing  . Swelling of face and throat  . A fast heartbeat  . A bad rash all over body  . Dizziness and weakness   Immunizations Administered    Name Date Dose VIS Date Route   Pfizer COVID-19 Vaccine 06/20/2019 12:16 PM 0.3 mL 03/03/2019 Intramuscular   Manufacturer: [employer], [employer]   Lot: [id number]   NDC: [address]-5

## 2019-06-27 ENCOUNTER — Telehealth (INDEPENDENT_AMBULATORY_CARE_PROVIDER_SITE_OTHER): Payer: Self-pay

## 2019-06-27 NOTE — Telephone Encounter (Signed)
[  3:12 PM] Marquez, Alexandria Maya called wanting to cancel her procedure on monday. Her husband just had emergency triple bypass. [phone].  I contacted the patient to let her know she has been canceled and to contact us when she is ready to be rescheduled.

## 2019-06-30 ENCOUNTER — Other Ambulatory Visit: Payer: Medicare Other

## 2019-07-03 ENCOUNTER — Ambulatory Visit: Admit: 2019-07-03 | Payer: Medicare Other | Admitting: Vascular Surgery

## 2019-07-03 ENCOUNTER — Other Ambulatory Visit (INDEPENDENT_AMBULATORY_CARE_PROVIDER_SITE_OTHER): Payer: Self-pay | Admitting: Nurse Practitioner

## 2019-07-03 SURGERY — VISCERAL ANGIOGRAPHY
Anesthesia: Moderate Sedation

## 2019-08-10 ENCOUNTER — Encounter: Payer: Self-pay | Admitting: Cardiovascular Disease

## 2019-08-10 ENCOUNTER — Ambulatory Visit (INDEPENDENT_AMBULATORY_CARE_PROVIDER_SITE_OTHER): Payer: Medicare Other | Admitting: Cardiovascular Disease

## 2019-08-10 ENCOUNTER — Other Ambulatory Visit: Payer: Self-pay

## 2019-08-10 VITALS — BP 132/80 | HR 64 | Ht 60.0 in | Wt 166.2 lb

## 2019-08-10 DIAGNOSIS — I25118 Atherosclerotic heart disease of native coronary artery with other forms of angina pectoris: Secondary | ICD-10-CM | POA: Diagnosis not present

## 2019-08-10 DIAGNOSIS — I10 Essential (primary) hypertension: Secondary | ICD-10-CM

## 2019-08-10 DIAGNOSIS — R0602 Shortness of breath: Secondary | ICD-10-CM | POA: Diagnosis not present

## 2019-08-10 DIAGNOSIS — R079 Chest pain, unspecified: Secondary | ICD-10-CM | POA: Diagnosis not present

## 2019-08-10 DIAGNOSIS — R072 Precordial pain: Secondary | ICD-10-CM

## 2019-08-10 DIAGNOSIS — E785 Hyperlipidemia, unspecified: Secondary | ICD-10-CM

## 2019-08-10 NOTE — Progress Notes (Signed)
Cardiology Office Note   Date:  08/10/2019   ID:  Alexandria, Alexandria Marquez 16, 1947, MRN [id number]  PCP:  Marinda Elk, MD  Cardiologist:   Kathlyn Sacramento, MD   Chief Complaint  Patient presents with  . other    SOB. Meds reviewed verbally with pt.      History of Present Illness: Alexandria Marquez is a 74 y.o. female who presents for evaluation of shortness of breath and chest pain. She has known history of coronary artery disease status post CABG x2 at [facility] with no cardiac events or cardiac catheterization since then. She has prolonged history of diabetes mellitus since 2002, essential hypertension and hyperlipidemia. She is not a smoker. She also has known history of peripheral arterial disease with recent SMA stent placement for suspected chronic mesenteric ischemia.  She reports progressive exertional dyspnea over the last year. This is currently happening with minimal exertion. She cannot do any physical activities without getting out of breath. Frequently, this is associated with substernal chest tightness and heaviness. She continues to have GI symptoms even after her recent SMA stent. She reports persistent nausea and she will be going to getting EGD in the near future. She denies orthopnea or leg edema. No change in her weight. She takes her medications regularly. I recently took care of her husband I was found to have significant coronary artery disease and required CABG.    Past Medical History:  Diagnosis Date  . Anxiety   . Arthritis   . Asthma    when young  . Coronary artery disease   . Depression   . Diabetes mellitus without complication ([place])   . Environmental and seasonal allergies   . GERD (gastroesophageal reflux disease)   . History of hiatal hernia   . Hypertension   . Hypothyroidism   . Lower extremity edema   . Motion sickness   . OSA (obstructive sleep apnea) 06/04/2014  . Scoliosis   . Shortness of breath dyspnea    doe    Past Surgical  History:  Procedure Laterality Date  . ABDOMINAL HYSTERECTOMY    . APPENDECTOMY    . BACK SURGERY  2014   lumbar fusion/plate/rods/screws  . CHOLECYSTECTOMY  2005  . COLONOSCOPY WITH PROPOFOL N/A 01/18/2017   Procedure: COLONOSCOPY WITH PROPOFOL;  Surgeon: Lollie Sails, MD;  Location: [facility] ENDOSCOPY;  Service: Endoscopy;  Laterality: N/A;  . CORONARY ARTERY BYPASS GRAFT     2008 2 vessels  . CORONARY ARTERY BYPASS GRAFT  2008  . ESOPHAGOGASTRODUODENOSCOPY (EGD) WITH PROPOFOL N/A 05/16/2018   Procedure: ESOPHAGOGASTRODUODENOSCOPY (EGD) WITH PROPOFOL;  Surgeon: Lollie Sails, MD;  Location: [facility] ENDOSCOPY;  Service: Endoscopy;  Laterality: N/A;  . FRACTURE SURGERY    . HERNIA REPAIR  1987   incisional  . JOINT REPLACEMENT Right 2017   knee  . ORIF ANKLE FRACTURE Left 06/19/2016   Procedure: OPEN REDUCTION INTERNAL FIXATION (ORIF) ANKLE FRACTURE;  Surgeon: Thornton Park, MD;  Location: [facility] ORS;  Service: Orthopedics;  Laterality: Left;  . rcr    . ROTATOR CUFF REPAIR Left 2013  . TONSILLECTOMY    . TOTAL KNEE ARTHROPLASTY Right 12/19/2015   Procedure: TOTAL KNEE ARTHROPLASTY;  Surgeon: Corky Mull, MD;  Location: [facility] ORS;  Service: Orthopedics;  Laterality: Right;  . TUBAL LIGATION    . VISCERAL ANGIOGRAPHY N/A 05/23/2018   Procedure: VISCERAL ANGIOGRAPHY;  Surgeon: Algernon Huxley, MD;  Location: [facility] CV LAB;  Service: Cardiovascular;  Laterality: N/A;     Current Outpatient Medications  Medication Sig Dispense Refill  . amLODipine (NORVASC) 5 MG tablet Take 5 mg by mouth every evening.    Marland Kitchen aspirin 81 MG tablet Take 1 tablet (81 mg total) by mouth daily. 30 tablet 0  . atorvastatin (LIPITOR) 40 MG tablet Take 40 mg by mouth at bedtime.     . enalapril (VASOTEC) 2.5 MG tablet Take 2.5 mg by mouth 2 (two) times daily.    . fluticasone (FLONASE) 50 MCG/ACT nasal spray Place 1-2 sprays into both nostrils daily as needed for allergies.     Marland Kitchen gabapentin (NEURONTIN)  300 MG capsule Take 600 mg by mouth at bedtime.    Marland Kitchen glimepiride (AMARYL) 2 MG tablet Take 1 tablet (2 mg total) by mouth 2 (two) times daily. 60 tablet 0  . glucose blood (ONETOUCH ULTRA) test strip USE 2 (TWO) TIMES DAILY. USE AS INSTRUCTED.    . hydrochlorothiazide (HYDRODIURIL) 25 MG tablet Take 25 mg by mouth daily.     . insulin detemir (LEVEMIR) 100 UNIT/ML injection Inject 27 Units into the skin daily.     . Insulin Pen Needle (FIFTY50 PEN NEEDLES) 31G X 5 MM MISC Use as directed    . levothyroxine (SYNTHROID, LEVOTHROID) 75 MCG tablet Take 75 mcg by mouth daily before breakfast.    . metFORMIN (GLUCOPHAGE) 1000 MG tablet Take 1,000 mg by mouth 2 (two) times daily with a meal.    . ondansetron (ZOFRAN-ODT) 4 MG disintegrating tablet Take 4 mg by mouth every 8 (eight) hours as needed for nausea or vomiting.    . pantoprazole (PROTONIX) 40 MG tablet Take 40 mg by mouth 2 (two) times daily.    Bertram Gala Glycol-Propyl Glycol (SYSTANE) 0.4-0.3 % SOLN Place 1 drop into both eyes 3 (three) times daily as needed (for dry eyes).    . sertraline (ZOLOFT) 100 MG tablet Take 100 mg by mouth daily.    Marland Kitchen tiZANidine (ZANAFLEX) 2 MG tablet Take 2 mg by mouth every 6 (six) hours as needed for muscle spasms.    . traMADol (ULTRAM) 50 MG tablet Take 1 tablet (50 mg total) by mouth every 6 (six) hours as needed for moderate pain or severe pain. 20 tablet 0  . VITAMIN D PO Take by mouth daily.     No current facility-administered medications for this visit.    Allergies:   Simethicone, Morphine and related, and Penicillins    Social History:  The patient  reports that she has never smoked. She has never used smokeless tobacco. She reports that she does not drink alcohol or use drugs.   Family History:  The patient's family history includes Breast cancer in her paternal grandmother; Breast cancer (age of onset: 31) in her maternal aunt; Diabetes in her father; Heart attack in her father and mother; Heart  disease in her father and mother.    ROS:  Please see the history of present illness.   Otherwise, review of systems are positive for none.   All other systems are reviewed and negative.    PHYSICAL EXAM: VS:  BP 132/80 (BP Location: Right Arm, Patient Position: Sitting, Cuff Size: Large)   Pulse 64   Ht 5' (1.524 m)   Wt 166 lb 4 oz (75.4 kg)   SpO2 98%   BMI 32.47 kg/m  , BMI Body mass index is 32.47 kg/m. GEN: Well nourished, well developed, in no acute distress  HEENT: normal  Neck:  no JVD, carotid bruits, or masses Cardiac: RRR; no murmurs, rubs, or gallops,no edema  Respiratory:  clear to auscultation bilaterally, normal work of breathing GI: soft, nontender, nondistended, + BS MS: no deformity or atrophy  Skin: warm and dry, no rash Neuro:  Strength and sensation are intact Psych: euthymic mood, full affect   EKG:  EKG is ordered today. The ekg ordered today demonstrates normal sinus rhythm with lateral T wave changes suggestive of ischemia.   Recent Labs: No results found for requested labs within last 8760 hours.    Lipid Panel No results found for: CHOL, TRIG, HDL, CHOLHDL, VLDL, LDLCALC, LDLDIRECT    Wt Readings from Last 3 Encounters:  08/10/19 166 lb 4 oz (75.4 kg)  06/02/19 169 lb 9.6 oz (76.9 kg)  06/21/18 171 lb 3.2 oz (77.7 kg)       PAD Screen 08/10/2019  Previous PAD dx? No  Previous surgical procedure? No  Pain with walking? No  Feet/toe relief with dangling? No  Painful, non-healing ulcers? No  Extremities discolored? No      ASSESSMENT AND PLAN:  1. Coronary artery disease involving native coronary arteries with other forms of angina: The patient's symptoms of dyspnea with minimal exertion with associated chest discomfort is concerning for possible angina. She has no recent cardiac evaluation and thus I recommend a Lexiscan Myoview and an echocardiogram. She is not able to exercise on a treadmill. She might ultimately require a right  and left cardiac catheterization considering severity of her symptoms.  2. Essential hypertension: Blood pressure is reasonably controlled on current medications.  3. Hyperlipidemia: Currently on atorvastatin 40 mg daily. Most recent lipid profile showed a triglyceride of 150 and an LDL of 78. Given her coronary artery disease and diabetes, we might need to be more aggressive with her lipid management. Should consider adding Zetia.    Disposition:   FU with me in 1 month  Signed,  Lorine Bears, MD  08/10/2019 4:40 PM    [facility]

## 2019-08-10 NOTE — Patient Instructions (Signed)
Medication Instructions:  Your physician recommends that you continue on your current medications as directed. Please refer to the Current Medication list given to you today.  *If you need a refill on your cardiac medications before your next appointment, please call your pharmacy*   Lab Work: None ordered If you have labs (blood work) drawn today and your tests are completely normal, you will receive your results only by: Marland Kitchen MyChart Message (if you have MyChart) OR . A paper copy in the mail If you have any lab test that is abnormal or we need to change your treatment, we will call you to review the results.   Testing/Procedures: Your physician has requested that you have an echocardiogram. Echocardiography is a painless test that uses sound waves to create images of your heart. It provides your doctor with information about the size and shape of your heart and how well your heart's chambers and valves are working. This procedure takes approximately one hour. There are no restrictions for this procedure.  Your physician has requested that you have a lexiscan myoview. For further information please visit [email or web address] Please follow instruction sheet, as given.     Follow-Up: At [facility], you and your health needs are our priority.  As part of our continuing mission to provide you with exceptional heart care, we have created designated Provider Care Teams.  These Care Teams include your primary Cardiologist (physician) and Advanced Practice Providers (APPs -  Physician Assistants and Nurse Practitioners) who all work together to provide you with the care you need, when you need it.  We recommend signing up for the patient portal called "MyChart".  Sign up information is provided on this After Visit Summary.  MyChart is used to connect with patients for Virtual Visits (Telemedicine).  Patients are able to view lab/test results, encounter notes, upcoming appointments, etc.   Non-urgent messages can be sent to your provider as well.   To learn more about what you can do with MyChart, go to [email or web address].    Your next appointment:   After testing   The format for your next appointment:   In Person  Provider:    You may see  Dr. Kirke Corin or one of the following Advanced Practice Providers on your designated Care Team:    Nicolasa Ducking, NP  Eula Listen, PA-C  Marisue Ivan, PA-C    Other Instructions [facility] MYOVIEW  Your caregiver has ordered a Stress Test with nuclear imaging. The purpose of this test is to evaluate the blood supply to your heart muscle. This procedure is referred to as a "Non-Invasive Stress Test." This is because other than having an IV started in your vein, nothing is inserted or "invades" your body. Cardiac stress tests are done to find areas of poor blood flow to the heart by determining the extent of coronary artery disease (CAD). Some patients exercise on a treadmill, which naturally increases the blood flow to your heart, while others who are  unable to walk on a treadmill due to physical limitations have a pharmacologic/chemical stress agent called Lexiscan . This medicine will mimic walking on a treadmill by temporarily increasing your coronary blood flow.   Please note: these test may take anywhere between 2-4 hours to complete  PLEASE REPORT TO [facility] MEDICAL MALL ENTRANCE  THE VOLUNTEERS AT THE FIRST DESK WILL DIRECT YOU WHERE TO GO  Date of Procedure:_____________________________________  Arrival Time for Procedure:______________________________  Instructions regarding medication:   __X__ : Hold diabetes medication morning  of procedure   __X__:  Hold other medications as follows: HOLD HCTZ the morning of the test  PLEASE NOTIFY THE OFFICE AT LEAST 24 HOURS IN ADVANCE IF YOU ARE UNABLE TO KEEP YOUR APPOINTMENT.  [phone] AND  PLEASE NOTIFY NUCLEAR MEDICINE AT [facility] AT LEAST 24 HOURS IN ADVANCE IF YOU ARE  UNABLE TO KEEP YOUR APPOINTMENT. [phone]  How to prepare for your Myoview test:  1. Do not eat or drink after midnight 2. No caffeine for 24 hours prior to test 3. No smoking 24 hours prior to test. 4. Your medication may be taken with water.  If your doctor stopped a medication because of this test, do not take that medication. 5. Ladies, please do not wear dresses.  Skirts or pants are appropriate. Please wear a short sleeve shirt. 6. No perfume, cologne or lotion. 7. Wear comfortable walking shoes. No heels!

## 2019-08-14 ENCOUNTER — Other Ambulatory Visit: Payer: Self-pay

## 2019-08-14 ENCOUNTER — Other Ambulatory Visit
Admission: RE | Admit: 2019-08-14 | Discharge: 2019-08-14 | Disposition: A | Discharge status: Home / Self Care | Payer: Medicare Other | Source: Ambulatory Visit | Attending: Internal Medicine | Admitting: Internal Medicine

## 2019-08-14 DIAGNOSIS — Z01812 Encounter for preprocedural laboratory examination: Secondary | ICD-10-CM | POA: Diagnosis present

## 2019-08-14 DIAGNOSIS — Z20822 Contact with and (suspected) exposure to covid-19: Secondary | ICD-10-CM | POA: Insufficient documentation

## 2019-08-14 LAB — SARS CORONAVIRUS 2 (TAT 6-24 HRS): SARS Coronavirus 2: NEGATIVE

## 2019-08-15 ENCOUNTER — Encounter: Payer: Self-pay | Admitting: Internal Medicine

## 2019-08-16 ENCOUNTER — Encounter: Payer: Self-pay | Admitting: Anesthesiology

## 2019-08-16 ENCOUNTER — Encounter: Payer: Self-pay | Admitting: Internal Medicine

## 2019-08-16 ENCOUNTER — Other Ambulatory Visit: Payer: Self-pay

## 2019-08-16 ENCOUNTER — Telehealth: Payer: Self-pay | Admitting: Cardiovascular Disease

## 2019-08-16 ENCOUNTER — Encounter
Admission: RE | Disposition: A | Discharge status: Home / Self Care | Payer: Self-pay | Source: Home / Self Care | Attending: Internal Medicine

## 2019-08-16 ENCOUNTER — Ambulatory Visit
Admission: RE | Admit: 2019-08-16 | Discharge: 2019-08-16 | Disposition: A | Discharge status: Home / Self Care | Payer: Medicare Other | Attending: Internal Medicine | Admitting: Internal Medicine

## 2019-08-16 DIAGNOSIS — Z79890 Hormone replacement therapy (postmenopausal): Secondary | ICD-10-CM | POA: Insufficient documentation

## 2019-08-16 DIAGNOSIS — Z7902 Long term (current) use of antithrombotics/antiplatelets: Secondary | ICD-10-CM | POA: Diagnosis not present

## 2019-08-16 DIAGNOSIS — K6289 Other specified diseases of anus and rectum: Secondary | ICD-10-CM | POA: Diagnosis not present

## 2019-08-16 DIAGNOSIS — F419 Anxiety disorder, unspecified: Secondary | ICD-10-CM | POA: Insufficient documentation

## 2019-08-16 DIAGNOSIS — Z79899 Other long term (current) drug therapy: Secondary | ICD-10-CM | POA: Diagnosis not present

## 2019-08-16 DIAGNOSIS — I2510 Atherosclerotic heart disease of native coronary artery without angina pectoris: Secondary | ICD-10-CM | POA: Diagnosis not present

## 2019-08-16 DIAGNOSIS — R1013 Epigastric pain: Secondary | ICD-10-CM | POA: Insufficient documentation

## 2019-08-16 DIAGNOSIS — R194 Change in bowel habit: Secondary | ICD-10-CM | POA: Diagnosis present

## 2019-08-16 DIAGNOSIS — M1990 Unspecified osteoarthritis, unspecified site: Secondary | ICD-10-CM | POA: Insufficient documentation

## 2019-08-16 DIAGNOSIS — I10 Essential (primary) hypertension: Secondary | ICD-10-CM | POA: Diagnosis not present

## 2019-08-16 DIAGNOSIS — E785 Hyperlipidemia, unspecified: Secondary | ICD-10-CM | POA: Insufficient documentation

## 2019-08-16 DIAGNOSIS — Z7982 Long term (current) use of aspirin: Secondary | ICD-10-CM | POA: Insufficient documentation

## 2019-08-16 DIAGNOSIS — M419 Scoliosis, unspecified: Secondary | ICD-10-CM | POA: Diagnosis not present

## 2019-08-16 DIAGNOSIS — Z794 Long term (current) use of insulin: Secondary | ICD-10-CM | POA: Diagnosis not present

## 2019-08-16 DIAGNOSIS — F329 Major depressive disorder, single episode, unspecified: Secondary | ICD-10-CM | POA: Diagnosis not present

## 2019-08-16 DIAGNOSIS — E039 Hypothyroidism, unspecified: Secondary | ICD-10-CM | POA: Diagnosis not present

## 2019-08-16 HISTORY — DX: Unspecified osteoarthritis, unspecified site: M19.90

## 2019-08-16 HISTORY — DX: Hyperlipidemia, unspecified: E78.5

## 2019-08-16 HISTORY — DX: Hypothyroidism, unspecified: E03.9

## 2019-08-16 LAB — GLUCOSE, CAPILLARY: Glucose-Capillary: 165 mg/dL — ABNORMAL HIGH (ref 70–99)

## 2019-08-16 SURGERY — ESOPHAGOGASTRODUODENOSCOPY (EGD) WITH PROPOFOL
Anesthesia: General

## 2019-08-16 MED ORDER — PROPOFOL 10 MG/ML IV BOLUS
INTRAVENOUS | Status: AC
  Filled 2019-08-16: qty 100

## 2019-08-16 NOTE — OR Nursing (Signed)
Received call from [facility] ar t Dr. Zena Amos office, Dr. Kennith Maes will have to give clearance himself, he is in procedures this am and not sure when he will be able to give clearance.    Explained to pt, she has chosen to reschedule EGD.

## 2019-08-16 NOTE — H&P (Signed)
Outpatient short stay form Pre-procedure 08/16/2019 8:13 AM Alexandria Marquez K. Alice Reichert, M.D.  Primary Physician: Paulita Cradle, M.D.  Reason for visit:  Change in bowel habits, hx proctitis, gastric nodule, dyspepsia  History of present illness:  Patient with hx gastric papule, irregular bowel habits. Presents with some abdominal cramping, one black stool.    No current facility-administered medications for this encounter.  Medications Prior to Admission  Medication Sig Dispense Refill Last Dose  . amLODipine (NORVASC) 5 MG tablet Take 5 mg by mouth every evening.   08/16/2019 at 0550  . aspirin 81 MG tablet Take 1 tablet (81 mg total) by mouth daily. 30 tablet 0 08/15/2019 at Unknown time  . atorvastatin (LIPITOR) 40 MG tablet Take 40 mg by mouth at bedtime.    08/15/2019 at Unknown time  . enalapril (VASOTEC) 2.5 MG tablet Take 2.5 mg by mouth 2 (two) times daily.   08/16/2019 at 0550  . ergocalciferol (VITAMIN D2) 1.25 MG (50000 UT) capsule Take 50,000 Units by mouth once a week.   08/15/2019 at Unknown time  . gabapentin (NEURONTIN) 300 MG capsule Take 600 mg by mouth at bedtime.   08/15/2019 at Unknown time  . glimepiride (AMARYL) 2 MG tablet Take 1 tablet (2 mg total) by mouth 2 (two) times daily. 60 tablet 0 08/15/2019 at Unknown time  . hydrochlorothiazide (HYDRODIURIL) 25 MG tablet Take 25 mg by mouth daily.    08/15/2019 at Unknown time  . insulin detemir (LEVEMIR) 100 UNIT/ML injection Inject 27 Units into the skin daily.    08/15/2019 at Unknown time  . levothyroxine (SYNTHROID, LEVOTHROID) 75 MCG tablet Take 75 mcg by mouth daily before breakfast.   08/15/2019 at Unknown time  . metFORMIN (GLUCOPHAGE) 1000 MG tablet Take 1,000 mg by mouth 2 (two) times daily with a meal.   08/15/2019 at Unknown time  . ondansetron (ZOFRAN-ODT) 4 MG disintegrating tablet Take 4 mg by mouth every 8 (eight) hours as needed for nausea or vomiting.   08/15/2019 at Unknown time  . pantoprazole (PROTONIX) 40 MG tablet  Take 40 mg by mouth 2 (two) times daily.   08/15/2019 at Unknown time  . sertraline (ZOLOFT) 100 MG tablet Take 100 mg by mouth daily.   08/15/2019 at Unknown time  . clopidogrel (PLAVIX) 75 MG tablet Take 75 mg by mouth daily.   Not Taking at Unknown time  . fluticasone (FLONASE) 50 MCG/ACT nasal spray Place 1-2 sprays into both nostrils daily as needed for allergies.      Marland Kitchen glucose blood (ONETOUCH ULTRA) test strip USE 2 (TWO) TIMES DAILY. USE AS INSTRUCTED.     . Insulin Pen Needle (FIFTY50 PEN NEEDLES) 31G X 5 MM MISC Use as directed     . Polyethyl Glycol-Propyl Glycol (SYSTANE) 0.4-0.3 % SOLN Place 1 drop into both eyes 3 (three) times daily as needed (for dry eyes).     Marland Kitchen tiZANidine (ZANAFLEX) 2 MG tablet Take 2 mg by mouth every 6 (six) hours as needed for muscle spasms.     . traMADol (ULTRAM) 50 MG tablet Take 1 tablet (50 mg total) by mouth every 6 (six) hours as needed for moderate pain or severe pain. 20 tablet 0   . VITAMIN D PO Take by mouth daily.        Allergies  Allergen Reactions  . Simethicone Other (See Comments)  . Morphine And Related Nausea And Vomiting    Severe n & v. Any other pain medications are okay  .  Penicillins Hives    Has patient had a PCN reaction causing immediate rash, facial/tongue/throat swelling, SOB or lightheadedness with hypotension: No Has patient had a PCN reaction causing severe rash involving mucus membranes or skin necrosis: No Has patient had a PCN reaction that required hospitalization No Has patient had a PCN reaction occurring within the last 10 years: No If all of the above answers are "NO", then may proceed with Cephalosporin use.      Past Medical History:  Diagnosis Date  . Anxiety   . Arthritis   . Asthma    when young  . Coronary artery disease   . Depression   . Diabetes mellitus without complication (HCC)   . Environmental and seasonal allergies   . GERD (gastroesophageal reflux disease)   . History of hiatal hernia    . Hyperlipidemia   . Hypertension   . Hypothyroid   . Hypothyroidism   . Lower extremity edema   . Motion sickness   . OSA (obstructive sleep apnea) 06/04/2014  . Osteoarthritis   . Scoliosis   . Shortness of breath dyspnea    doe    Review of systems:  Otherwise negative.    Physical Exam  Gen: Alert, oriented. Appears stated age.  HEENT: [place]/AT. PERRLA. Lungs: CTA, no wheezes. CV: RR nl S1, S2. Abd: soft, benign, no masses. BS+ Ext: No edema. Pulses 2+    Planned procedures: Proceed with colonoscopy. The patient understands the nature of the planned procedure, indications, risks, alternatives and potential complications including but not limited to bleeding, infection, perforation, damage to internal organs and possible oversedation/side effects from anesthesia. The patient agrees and gives consent to proceed.  Please refer to procedure notes for findings, recommendations and patient disposition/instructions.     Graeme Menees K. Norma Fredrickson, M.D. Gastroenterology 08/16/2019  8:13 AM

## 2019-08-16 NOTE — OR Nursing (Signed)
Spoke with Victorino Dike at Dr. Jari Sportsman office regarding a cardiac clearance per Dr. Marthann Schiller request. Victorino Dike has contacted the PA scheduled for clearances today to look at pt's records and let us know if we have clearance or not to proceed with todays scheduled EGD.

## 2019-08-16 NOTE — Telephone Encounter (Signed)
° °  [facility] Pre-operative Risk Assessment    HEARTCARE STAFF: - Please ensure there is not already an duplicate clearance open for this procedure. - Under Visit Info/Reason for Call, type in Other and utilize the format Clearance MM/DD/YY or Clearance TBD. Do not use dashes or single digits. - If request is for dental extraction, please clarify the # of teeth to be extracted.  Request for surgical clearance:  1. What type of surgery is being performed? Upper Endoscopy   2. When is this surgery scheduled? Now  3. What type of clearance is required (medical clearance vs. Pharmacy clearance to hold med vs. Both)? Medical   4. Are there any medications that need to be held prior to surgery and how long? No   5. Practice name and name of physician performing surgery?  [facility] for anesthesia clearance today given arida's last note and lexi  6. What is the office phone number?  [phone]   7.   What is the office fax number?  [phone]  8.   Anesthesia type (None, local, MAC, general) ? Propofol    Clarisse Gouge 08/16/2019, 8:06 AM  _________________________________________________________________   (provider comments below)

## 2019-08-16 NOTE — Telephone Encounter (Signed)
Per Vergia Alcon NP fwd to Dr. Kirke Corin to determine.

## 2019-08-16 NOTE — Telephone Encounter (Signed)
It is best to wait until her echo and Lexiscan Myoview are done before giving clearance.

## 2019-08-16 NOTE — Telephone Encounter (Signed)
Spoke with Alexandria Marquez in endo and provided the update. The patients endoscopy will be rescheduled after cardiac work up is completed and cardiac clearance can be given.

## 2019-08-22 NOTE — Telephone Encounter (Signed)
Awaiting echo and myoview, both have been scheduled.

## 2019-08-25 ENCOUNTER — Other Ambulatory Visit: Payer: Medicare Other

## 2019-08-25 ENCOUNTER — Encounter
Admission: RE | Admit: 2019-08-25 | Discharge: 2019-08-25 | Disposition: A | Discharge status: Home / Self Care | Payer: Medicare Other | Source: Ambulatory Visit | Attending: Cardiovascular Disease | Admitting: Cardiovascular Disease

## 2019-08-25 ENCOUNTER — Other Ambulatory Visit: Payer: Self-pay

## 2019-08-25 DIAGNOSIS — R072 Precordial pain: Secondary | ICD-10-CM | POA: Diagnosis present

## 2019-08-25 DIAGNOSIS — R079 Chest pain, unspecified: Secondary | ICD-10-CM | POA: Diagnosis present

## 2019-08-25 DIAGNOSIS — R0602 Shortness of breath: Secondary | ICD-10-CM | POA: Insufficient documentation

## 2019-08-25 LAB — NM MYOCAR MULTI W/SPECT W/WALL MOTION / EF
LV dias vol: 36 mL (ref 46–106)
LV sys vol: 1 mL
Peak HR: 114 {beats}/min
Percent HR: 78 %
Rest HR: 59 {beats}/min
SDS: 0
SRS: 0
SSS: 0
TID: 1.25

## 2019-08-25 MED ORDER — TECHNETIUM TC 99M TETROFOSMIN IV KIT
30.0000 | PACK | Freq: Once | INTRAVENOUS | Status: AC | PRN
  Administered 2019-08-25: 31.62 via INTRAVENOUS

## 2019-08-25 MED ORDER — TECHNETIUM TC 99M TETROFOSMIN IV KIT
10.4200 | PACK | Freq: Once | INTRAVENOUS | Status: AC | PRN
  Administered 2019-08-25: 10.42 via INTRAVENOUS

## 2019-08-25 MED ORDER — REGADENOSON 0.4 MG/5ML IV SOLN
0.4000 mg | Freq: Once | INTRAVENOUS | Status: AC
  Administered 2019-08-25: 0.4 mg via INTRAVENOUS

## 2019-08-28 ENCOUNTER — Ambulatory Visit (INDEPENDENT_AMBULATORY_CARE_PROVIDER_SITE_OTHER): Payer: Medicare Other

## 2019-08-28 ENCOUNTER — Other Ambulatory Visit: Payer: Self-pay

## 2019-08-28 DIAGNOSIS — R0602 Shortness of breath: Secondary | ICD-10-CM

## 2019-08-28 DIAGNOSIS — R079 Chest pain, unspecified: Secondary | ICD-10-CM | POA: Diagnosis not present

## 2019-08-30 NOTE — Telephone Encounter (Signed)
Hi Dr. Kirke Corin,  I saw that Ms. Kuehnle stress test came back normal and Echo showed normal EF with mild pulmonary hypertension. Your last office visit note mentioned possibly needing right/left heart cath given severity of her symptoms. Is she OK to proceed with EGD or would you like her to wait until after follow-up with Alycia Rossetti on 09/29/2019?  Please route response back to P [facility] PREOP.  Thank you! Laithan Conchas

## 2019-08-31 NOTE — Telephone Encounter (Signed)
She can proceed with an EGD at an overall low risk given that cardiac testing has been unrevealing so far.

## 2019-08-31 NOTE — Telephone Encounter (Signed)
   Primary Cardiologist: Lorine Bears, MD  Chart reviewed as part of pre-operative protocol coverage. Given past medical history and time since last visit, based on ACC/AHA guidelines, CHANDEL ZAUN would be at acceptable risk for the planned procedure without further cardiovascular testing.   I will route this recommendation to the requesting party via Epic fax function and remove from pre-op pool.  Please call with questions.  Thomasene Ripple. Denene Alamillo NP-C    08/31/2019, 10:58 AM [facility] [address] Office [phone] Fax [phone]

## 2019-09-01 ENCOUNTER — Ambulatory Visit: Payer: Medicare Other | Admitting: Cardiovascular Disease

## 2019-09-21 ENCOUNTER — Other Ambulatory Visit: Payer: Medicare Other

## 2019-09-27 NOTE — Progress Notes (Signed)
Cardiology Office Note    Date:  09/29/2019   ID:  Alexandria Marquez, Alexandria Marquez 12-20-1945, MRN [id number]  PCP:  Patrice Paradise, MD  Cardiologist:  Lorine Bears, MD  Electrophysiologist:  None   Chief Complaint: Follow-up  History of Present Illness:   Alexandria Marquez is a 74 y.o. female with history of CAD status post two-vessel CABG at [facility] in 2008, diastolic dysfunction, mild pulmonary hypertension, DM2 since 2002, PAD status post SMA stenting for suspected chronic mesenteric ischemia in 05/2018, HTN, HLD, hypothyroidism, OSA, depression, anxiety, and GERD who presents for follow-up of recent echo and Lexiscan MPI.  She was recently evaluated in 07/2019 with progressive exertional dyspnea over the preceding year that was now occurring with minimal exertion.  This was also noted to be associated with substernal chest tightness and heaviness.  She continued to have GI symptoms despite her SMA stenting, with persistent nausea.  Given the symptoms she underwent Lexiscan MPI on 08/25/2019 which showed no significant ischemia with a hyperdynamic LVSF with an EF greater than 65% and was overall a low risk study.  Echo on 08/28/2019 showed an EF of 60 to 65%, no regional wall motion normalities, mild LVH, grade 1 diastolic dysfunction, normal RV systolic function and RV cavity size, mildly elevated PASP estimated at 43.9 mmHg, mild mitral regurgitation, mild to moderate tricuspid regurgitation, and mild to moderate aortic valve sclerosis without stenosis.  She comes in today noting her dyspnea is unchanged.  She reports she has a longstanding history of shortness of breath level over the past 12 months this has been progressively getting worse and now occurs with minimal exertion such as ambulation.  She denies any chest pain/pressure, palpitations, presyncope, or syncope.  Symptoms do not feel similar to what she felt leading up to her bypass surgery.  Weight has been stable.  She notes some mild intermittent  lower extremity swelling that is typically better in the mornings.  She has stable 1 pillow orthopnea.  She was never a smoker though her parents did.  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  She also reports a prior history of exercise-induced asthma.  She does note some intermittent dizziness that is longstanding.   Labs independently reviewed: 04/2019 - Hgb 12.7, PLT 288, potassium 4.7, BUN 29, serum creatinine 1.3, albumin 4.3, AST/ALT normal, A1c 8.2, TC 154, TG 150, HDL 46, LDL 78 01/2019 - TSH normal  Past Medical History:  Diagnosis Date  . Anxiety   . Arthritis   . Asthma    when young  . Coronary artery disease   . Depression   . Diabetes mellitus without complication (HCC)   . Environmental and seasonal allergies   . GERD (gastroesophageal reflux disease)   . History of hiatal hernia   . Hyperlipidemia   . Hypertension   . Hypothyroid   . Hypothyroidism   . Lower extremity edema   . Motion sickness   . OSA (obstructive sleep apnea) 06/04/2014  . Osteoarthritis   . Scoliosis   . Shortness of breath dyspnea    doe    Past Surgical History:  Procedure Laterality Date  . ABDOMINAL HYSTERECTOMY    . APPENDECTOMY    . BACK SURGERY  2014   lumbar fusion/plate/rods/screws  . CHOLECYSTECTOMY  2005  . COLONOSCOPY WITH PROPOFOL N/A 01/18/2017   Procedure: COLONOSCOPY WITH PROPOFOL;  Surgeon: Christena Deem, MD;  Location: [facility] ENDOSCOPY;  Service: Endoscopy;  Laterality: N/A;  . CORONARY ARTERY BYPASS GRAFT     2008 2 vessels  . CORONARY ARTERY BYPASS GRAFT  2008  . ESOPHAGOGASTRODUODENOSCOPY (EGD) WITH PROPOFOL N/A 05/16/2018   Procedure: ESOPHAGOGASTRODUODENOSCOPY (EGD) WITH PROPOFOL;  Surgeon: Christena Deem, MD;  Location: [facility] ENDOSCOPY;  Service: Endoscopy;  Laterality: N/A;  . FRACTURE SURGERY    . HERNIA REPAIR  1987   incisional  . JOINT REPLACEMENT Right 2017   knee  . ORIF ANKLE  FRACTURE Left 06/19/2016   Procedure: OPEN REDUCTION INTERNAL FIXATION (ORIF) ANKLE FRACTURE;  Surgeon: Juanell Fairly, MD;  Location: [facility] ORS;  Service: Orthopedics;  Laterality: Left;  . rcr    . ROTATOR CUFF REPAIR Left 2013  . TONSILLECTOMY    . TOTAL KNEE ARTHROPLASTY Right 12/19/2015   Procedure: TOTAL KNEE ARTHROPLASTY;  Surgeon: Christena Flake, MD;  Location: [facility] ORS;  Service: Orthopedics;  Laterality: Right;  . TUBAL LIGATION    . VISCERAL ANGIOGRAPHY N/A 05/23/2018   Procedure: VISCERAL ANGIOGRAPHY;  Surgeon: Annice Needy, MD;  Location: [facility] INVASIVE CV LAB;  Service: Cardiovascular;  Laterality: N/A;    Current Medications: Current Meds  Medication Sig  . amLODipine (NORVASC) 5 MG tablet Take 5 mg by mouth every evening.  Marland Kitchen aspirin 81 MG tablet Take 1 tablet (81 mg total) by mouth daily.  Marland Kitchen atorvastatin (LIPITOR) 40 MG tablet Take 40 mg by mouth at bedtime.   . enalapril (VASOTEC) 2.5 MG tablet Take 2.5 mg by mouth 2 (two) times daily.  . fluticasone (FLONASE) 50 MCG/ACT nasal spray Place 1-2 sprays into both nostrils daily as needed for allergies.   Marland Kitchen gabapentin (NEURONTIN) 300 MG capsule Take 600 mg by mouth at bedtime.  Marland Kitchen glimepiride (AMARYL) 2 MG tablet Take 1 tablet (2 mg total) by mouth 2 (two) times daily.  Marland Kitchen glucose blood (ONETOUCH ULTRA) test strip USE 2 (TWO) TIMES DAILY. USE AS INSTRUCTED.  . hydrochlorothiazide (HYDRODIURIL) 25 MG tablet Take 25 mg by mouth daily.   . insulin detemir (LEVEMIR) 100 UNIT/ML injection Inject 27 Units into the skin daily.   . Insulin Pen Needle (FIFTY50 PEN NEEDLES) 31G X 5 MM MISC Use as directed  . levothyroxine (SYNTHROID, LEVOTHROID) 75 MCG tablet Take 75 mcg by mouth daily before breakfast.  . metFORMIN (GLUCOPHAGE) 1000 MG tablet Take 1,000 mg by mouth 2 (two) times daily with a meal.  . ondansetron (ZOFRAN-ODT) 4 MG disintegrating tablet Take 4 mg by mouth every 8 (eight) hours as needed for nausea or vomiting.  . pantoprazole  (PROTONIX) 40 MG tablet Take 40 mg by mouth 2 (two) times daily.  Bertram Gala Glycol-Propyl Glycol (SYSTANE) 0.4-0.3 % SOLN Place 1 drop into both eyes 3 (three) times daily as needed (for dry eyes).  . sertraline (ZOLOFT) 100 MG tablet Take 100 mg by mouth daily.  Marland Kitchen tiZANidine (ZANAFLEX) 2 MG tablet Take 2 mg by mouth every 6 (six) hours as needed for muscle spasms.  Marland Kitchen VITAMIN D PO Take by mouth daily.    Allergies:   Simethicone, Morphine and related, and Penicillins   Social History   Socioeconomic History  . Marital status: Married    Spouse name: Ranell Patrick  . Number of children: Not on file  . Years of education: Not on file  . Highest education level: Not on file  Occupational History  . Not on file  Tobacco Use  . Smoking status: Never Smoker  . Smokeless tobacco: Never Used  Vaping Use  . Vaping Use: Never used  Substance and Sexual Activity  . Alcohol use: No  . Drug use: Yes    Types: Hydrocodone  . Sexual activity: Not Currently  Other Topics Concern  . Not on file  Social History Narrative  . Not on file   Social Determinants of Health   Financial Resource Strain:   . Difficulty of Paying Living Expenses:   Food Insecurity:   . Worried About Programme researcher, broadcasting/film/videounning Out of Food in the Last Year:   . Baristaan Out of Food in the Last Year:   Transportation Needs:   . Freight forwarderLack of Transportation (Medical):   Marland Kitchen. Lack of Transportation (Non-Medical):   Physical Activity:   . Days of Exercise per Week:   . Minutes of Exercise per Session:   Stress:   . Feeling of Stress :   Social Connections:   . Frequency of Communication with Friends and Family:   . Frequency of Social Gatherings with Friends and Family:   . Attends Religious Services:   . Active Member of Clubs or Organizations:   . Attends BankerClub or Organization Meetings:   Marland Kitchen. Marital Status:      Family History:  The patient's family history includes Breast cancer in her paternal grandmother; Breast cancer (age of onset: 7130) in  her maternal aunt; Cancer in her paternal aunt; Diabetes in her father; Heart attack in her father and mother; Heart disease in her father and mother.  ROS:   Review of Systems  Constitutional: Positive for malaise/fatigue. Negative for chills, diaphoresis, fever and weight loss.  HENT: Negative for congestion.   Eyes: Negative for discharge and redness.  Respiratory: Positive for shortness of breath. Negative for cough, sputum production and wheezing.   Cardiovascular: Positive for leg swelling. Negative for chest pain, palpitations, orthopnea, claudication and PND.  Gastrointestinal: Negative for abdominal pain, heartburn, nausea and vomiting.  Musculoskeletal: Negative for falls and myalgias.  Skin: Negative for rash.  Neurological: Positive for dizziness. Negative for tingling, tremors, sensory change, speech change, focal weakness, loss of consciousness and weakness.  Endo/Heme/Allergies: Does not bruise/bleed easily.  Psychiatric/Behavioral: Negative for substance abuse. The patient is not nervous/anxious.   All other systems reviewed and are negative.    EKGs/Labs/Other Studies Reviewed:    Studies reviewed were summarized above. The additional studies were reviewed today:   Lexiscan MPI 08/25/2019:  T wave inversion was noted during stress in the I, aVL, V4, V5, V6, V2, aVF and V3 leads.  There was no ST segment deviation noted during stress.  The study is normal.  This is a low risk study.  The left ventricular ejection fraction is hyperdynamic (>65%). __________  2D echo 08/28/2019: 1. Left ventricular ejection fraction, by estimation, is 60 to 65%. The  left ventricle has normal function. The left ventricle has no regional  wall motion abnormalities. There is mild left ventricular hypertrophy.  Left ventricular diastolic parameters  are consistent with Grade I diastolic dysfunction (impaired relaxation).  2. Right ventricular systolic function is normal. The right  ventricular  size is normal. There is mildly elevated pulmonary artery systolic  pressure. The estimated right ventricular systolic pressure is 43.9 mmHg.  3. The mitral valve is normal in structure. Mild mitral valve  regurgitation. No evidence of mitral stenosis.  4. Tricuspid valve regurgitation is mild to moderate.  5. The aortic valve is normal in structure. Aortic valve regurgitation is  not visualized. Mild to moderate aortic valve sclerosis/calcification is  present, without any evidence of aortic stenosis.  6. The inferior vena cava is normal in size with greater than 50%  respiratory variability, suggesting right atrial pressure of 3 mmHg.   EKG:  EKG is not ordered today.   Recent Labs: No results found for requested labs within last 8760 hours.  Recent Lipid Panel No results found for: CHOL, TRIG, HDL, CHOLHDL, VLDL, LDLCALC, LDLDIRECT  PHYSICAL EXAM:    VS:  BP 124/70 (BP Location: Right Arm, Patient Position: Sitting, Cuff Size: Large)   Pulse 64   Ht 5' (1.524 m)   Wt 168 lb (76.2 kg)   SpO2 98%   BMI 32.81 kg/m   BMI: Body mass index is 32.81 kg/m.  Physical Exam Constitutional:      Appearance: She is well-developed.  HENT:     Head: Normocephalic and atraumatic.  Eyes:     General:        Right eye: No discharge.        Left eye: No discharge.  Neck:     Vascular: No JVD.  Cardiovascular:     Rate and Rhythm: Normal rate and regular rhythm.     Pulses: No midsystolic click and no opening snap.          Dorsalis pedis pulses are 2+ on the right side and 2+ on the left side.       Posterior tibial pulses are 2+ on the right side and 2+ on the left side.     Heart sounds: Normal heart sounds, S1 normal and S2 normal. Heart sounds not distant. No murmur heard.  No friction rub.  Pulmonary:     Effort: Pulmonary effort is normal. No respiratory distress.     Breath sounds: Normal breath sounds. No decreased breath sounds, wheezing or rales.  Chest:      Chest wall: No tenderness.  Abdominal:     General: There is no distension.     Palpations: Abdomen is soft.     Tenderness: There is no abdominal tenderness.  Musculoskeletal:     Cervical back: Normal range of motion.  Skin:    General: Skin is warm and dry.     Nails: There is no clubbing.  Neurological:     Mental Status: She is alert and oriented to person, place, and time.  Psychiatric:        Speech: Speech normal.        Behavior: Behavior normal.        Thought Content: Thought content normal.        Judgment: Judgment normal.     Wt Readings from Last 3 Encounters:  09/29/19 168 lb (76.2 kg)  08/16/19 166 lb (75.3 kg)  08/10/19 166 lb 4 oz (75.4 kg)     ASSESSMENT & PLAN:   1. CAD status post CABG without angina: She denies any symptoms of chest pain or symptoms similar to her presentation prior to her bypass surgery.  Recent Lexiscan MPI showed no significant ischemia.  Continue current medical therapy including aspirin, amlodipine, atorvastatin, and enalapril.  Following outpatient diuresis over the next week, if her symptoms remain unchanged consider PFT evaluation versus R/LHC.  2. Diastolic dysfunction/pulmonary hypertension: Likely playing some role in her overall presentation with dyspnea though this has been felt to not be the likely sole etiology of her symptoms.  Stop HCTZ.  Start Lasix 20 mg daily.  Check BMP today and again in follow-up when she is seen in 1 week.  She denies any history of tobacco use though does have a significant history of secondhand exposure.  Could consider outpatient sleep study as well.  3. HTN: Blood pressure is well controlled in the office today.  Continue current medical therapy  4. HLD: LDL 78 from 04/2019, with normal liver function at that time.  Continue atorvastatin  Disposition: F/u with Dr. Kirke Corin or an APP in 1 week.   Medication Adjustments/Labs and Tests Ordered: Current medicines are reviewed at length with the  patient today.  Concerns regarding medicines are outlined above. Medication changes, Labs and Tests ordered today are summarized above and listed in the Patient Instructions accessible in Encounters.   Signed, Eula Listen, PA-C 09/29/2019 1:30 PM     [facility] [address] [facility] [address] [place], [place] [address] [phone]

## 2019-09-29 ENCOUNTER — Encounter: Payer: Self-pay | Admitting: Physician Assistant

## 2019-09-29 ENCOUNTER — Ambulatory Visit (INDEPENDENT_AMBULATORY_CARE_PROVIDER_SITE_OTHER): Payer: Medicare Other | Admitting: Physician Assistant

## 2019-09-29 ENCOUNTER — Other Ambulatory Visit: Payer: Self-pay

## 2019-09-29 VITALS — BP 124/70 | HR 64 | Ht 60.0 in | Wt 168.0 lb

## 2019-09-29 DIAGNOSIS — I5189 Other ill-defined heart diseases: Secondary | ICD-10-CM | POA: Diagnosis not present

## 2019-09-29 DIAGNOSIS — I272 Pulmonary hypertension, unspecified: Secondary | ICD-10-CM

## 2019-09-29 DIAGNOSIS — I10 Essential (primary) hypertension: Secondary | ICD-10-CM

## 2019-09-29 DIAGNOSIS — E785 Hyperlipidemia, unspecified: Secondary | ICD-10-CM

## 2019-09-29 DIAGNOSIS — I25118 Atherosclerotic heart disease of native coronary artery with other forms of angina pectoris: Secondary | ICD-10-CM

## 2019-09-29 MED ORDER — FUROSEMIDE 20 MG PO TABS: 20.0000 mg | ORAL_TABLET | Freq: Every day | ORAL | 3 refills | Status: DC

## 2019-09-29 NOTE — Patient Instructions (Addendum)
Medication Instructions:  Your physician has recommended you make the following change in your medication:  1. STOP Hydrochlorothiazide 2. START Furosemide 20 mg once daily   *If you need a refill on your cardiac medications before your next appointment, please call your pharmacy*   Lab Work: BMET today  If you have labs (blood work) drawn today and your tests are completely normal, you will receive your results only by: Marland Kitchen MyChart Message (if you have MyChart) OR . A paper copy in the mail If you have any lab test that is abnormal or we need to change your treatment, we will call you to review the results.   Testing/Procedures: None   Follow-Up: At [facility], you and your health needs are our priority.  As part of our continuing mission to provide you with exceptional heart care, we have created designated Provider Care Teams.  These Care Teams include your primary Cardiologist (physician) and Advanced Practice Providers (APPs -  Physician Assistants and Nurse Practitioners) who all work together to provide you with the care you need, when you need it.   Your next appointment:   1 week(s)  The format for your next appointment:   In Person  Provider:    You may see Lorine Bears, MD or one of the following Advanced Practice Providers on your designated Care Team:    Nicolasa Ducking, NP  Eula Listen, PA-C  Marisue Ivan, PA-C

## 2019-09-30 LAB — BASIC METABOLIC PANEL
BUN/Creatinine Ratio: 22 (ref 12–28)
BUN: 29 mg/dL — ABNORMAL HIGH (ref 8–27)
CO2: 21 mmol/L (ref 20–29)
Calcium: 9.6 mg/dL (ref 8.7–10.3)
Chloride: 100 mmol/L (ref 96–106)
Creatinine, Ser: 1.31 mg/dL — ABNORMAL HIGH (ref 0.57–1.00)
GFR calc Af Amer: 46 mL/min/{1.73_m2} — ABNORMAL LOW (ref >59)
GFR calc non Af Amer: 40 mL/min/{1.73_m2} — ABNORMAL LOW (ref >59)
Glucose: 171 mg/dL — ABNORMAL HIGH (ref 65–99)
Potassium: 4.5 mmol/L (ref 3.5–5.2)
Sodium: 136 mmol/L (ref 134–144)

## 2019-10-02 NOTE — Progress Notes (Signed)
Cardiology Office Note    Date:  10/06/2019   ID:  Alexandria Marquez, DOB 08/21/1945, MRN [id number]  PCP:  Patrice ParadiseMcLaughlin, Miriam K, MD  Cardiologist:  Lorine BearsMuhammad Arida, MD  Electrophysiologist:  None   Chief Complaint: Follow up  History of Present Illness:   Alexandria Marquez is a 74 y.o. female with history of CAD status post [facility]essel [facility], diastolic dysfunction, mild pulmonary hypertension, DM2 since 2002, PAD status post SMA stenting for suspected chronic mesenteric ischemia in 05/2018, HTN, HLD, hypothyroidism, OSA, depression, anxiety, and GERD who presents for follow-up of dyspnea.  She was recently evaluated in 07/2019 with progressive exertional dyspnea over the preceding year that was now occurring with minimal exertion.  This was also noted to be associated with substernal chest tightness and heaviness.  She continued to have GI symptoms despite her SMA stenting, with persistent nausea.  Given these symptoms, she underwent Lexiscan MPI on 08/25/2019 which showed no significant ischemia with a hyperdynamic LVSF with an EF greater than 65% and was overall a low risk study.  Echo on 08/28/2019 showed an EF of 60 to 65%, no regional wall motion normalities, mild LVH, grade 1 diastolic dysfunction, normal RV systolic function and RV cavity size, mildly elevated PASP estimated at 43.9 mmHg, mild mitral regurgitation, mild to moderate tricuspid regurgitation, and mild to moderate aortic valve sclerosis without stenosis.  She was seen in the office on 09/29/2019 and was feeling about the same with a 12 month history of dyspnea that had been getting progressively worse.  Her symptoms did not feel similar to her presenting symptoms prior to her bypass surgery.  She was started on Lasix 20 mg daily and HCTZ was stopped.  She did note a significant second-hand smoke exposure history.   She comes in today feeling about the same as she has for the past couple of visits.  That said, she was on the campus  of [facility] on 7/15 while her husband was getting a colonoscopy and was able to get around reasonably well.  Her weight is up 2 pounds today when compared to her visit 1 week ago with a trend of 168 to 170 pounds.  She has not noted a significant change in her symptoms with addition of furosemide.  She continues to note generalized fatigue, dyspnea, and chest pressure all of which are exertional.  When asked which of the above is bothering her the most, her main complaint right now is exertional dyspnea.  Symptoms feel similar to her presentation leading up to her bypass surgery.  She does try and watch her salt intake though does consume some salty foods.   Labs independently reviewed: 09/2019 - BUN 29, SCr 1.31, potassium 4.5 04/2019 - Hgb 12.7, PLT 288, potassium 4.7, albumin 4.3, AST/ALT normal, A1c 8.2, TC 154, TG 150, HDL 46, LDL 78 01/2019 - TSH normal  Past Medical History:  Diagnosis Date  . Anxiety   . Arthritis   . Asthma    when young  . Coronary artery disease   . Depression   . Diabetes mellitus without complication (HCC)   . Environmental and seasonal allergies   . GERD (gastroesophageal reflux disease)   . History of hiatal hernia   . Hyperlipidemia   . Hypertension   . Hypothyroid   . Hypothyroidism   . Lower extremity edema   . Motion sickness   . OSA (obstructive sleep apnea) 06/04/2014  . Osteoarthritis   .  Scoliosis   . Shortness of breath dyspnea    doe    Past Surgical History:  Procedure Laterality Date  . ABDOMINAL HYSTERECTOMY    . APPENDECTOMY    . BACK SURGERY  2014   lumbar fusion/plate/rods/screws  . CHOLECYSTECTOMY  2005  . COLONOSCOPY WITH PROPOFOL N/A 01/18/2017   Procedure: COLONOSCOPY WITH PROPOFOL;  Surgeon: Christena Deem, MD;  Location: [facility] ENDOSCOPY;  Service: Endoscopy;  Laterality: N/A;  . CORONARY ARTERY BYPASS GRAFT     2008 2 vessels  . CORONARY ARTERY BYPASS GRAFT  2008  . ESOPHAGOGASTRODUODENOSCOPY  (EGD) WITH PROPOFOL N/A 05/16/2018   Procedure: ESOPHAGOGASTRODUODENOSCOPY (EGD) WITH PROPOFOL;  Surgeon: Christena Deem, MD;  Location: [facility] ENDOSCOPY;  Service: Endoscopy;  Laterality: N/A;  . FRACTURE SURGERY    . HERNIA REPAIR  1987   incisional  . JOINT REPLACEMENT Right 2017   knee  . ORIF ANKLE FRACTURE Left 06/19/2016   Procedure: OPEN REDUCTION INTERNAL FIXATION (ORIF) ANKLE FRACTURE;  Surgeon: Juanell Fairly, MD;  Location: [facility] ORS;  Service: Orthopedics;  Laterality: Left;  . rcr    . ROTATOR CUFF REPAIR Left 2013  . TONSILLECTOMY    . TOTAL KNEE ARTHROPLASTY Right 12/19/2015   Procedure: TOTAL KNEE ARTHROPLASTY;  Surgeon: Christena Flake, MD;  Location: [facility] ORS;  Service: Orthopedics;  Laterality: Right;  . TUBAL LIGATION    . VISCERAL ANGIOGRAPHY N/A 05/23/2018   Procedure: VISCERAL ANGIOGRAPHY;  Surgeon: Annice Needy, MD;  Location: [facility] INVASIVE CV LAB;  Service: Cardiovascular;  Laterality: N/A;    Current Medications: Current Meds  Medication Sig  . amLODipine (NORVASC) 5 MG tablet Take 5 mg by mouth every evening.  Marland Kitchen aspirin 81 MG tablet Take 1 tablet (81 mg total) by mouth daily.  Marland Kitchen atorvastatin (LIPITOR) 40 MG tablet Take 40 mg by mouth at bedtime.   . enalapril (VASOTEC) 2.5 MG tablet Take 2.5 mg by mouth 2 (two) times daily.  . fluticasone (FLONASE) 50 MCG/ACT nasal spray Place 1-2 sprays into both nostrils daily as needed for allergies.   . furosemide (LASIX) 20 MG tablet Take 1 tablet (20 mg total) by mouth daily.  Marland Kitchen gabapentin (NEURONTIN) 300 MG capsule Take 600 mg by mouth at bedtime.  Marland Kitchen glimepiride (AMARYL) 2 MG tablet Take 1 tablet (2 mg total) by mouth 2 (two) times daily.  Marland Kitchen glucose blood (ONETOUCH ULTRA) test strip USE 2 (TWO) TIMES DAILY. USE AS INSTRUCTED.  Marland Kitchen insulin detemir (LEVEMIR) 100 UNIT/ML injection Inject 27 Units into the skin daily.   . Insulin Pen Needle (FIFTY50 PEN NEEDLES) 31G X 5 MM MISC Use as directed  . levothyroxine (SYNTHROID,  LEVOTHROID) 75 MCG tablet Take 75 mcg by mouth daily before breakfast.  . metFORMIN (GLUCOPHAGE) 1000 MG tablet Take 1,000 mg by mouth 2 (two) times daily with a meal.  . ondansetron (ZOFRAN-ODT) 4 MG disintegrating tablet Take 4 mg by mouth every 8 (eight) hours as needed for nausea or vomiting.  . pantoprazole (PROTONIX) 40 MG tablet Take 40 mg by mouth 2 (two) times daily.  Bertram Gala Glycol-Propyl Glycol (SYSTANE) 0.4-0.3 % SOLN Place 1 drop into both eyes 3 (three) times daily as needed (for dry eyes).  . sertraline (ZOLOFT) 100 MG tablet Take 100 mg by mouth daily.  Marland Kitchen tiZANidine (ZANAFLEX) 2 MG tablet Take 2 mg by mouth every 6 (six) hours as needed for muscle spasms.  Marland Kitchen VITAMIN D PO Take by mouth daily.    Allergies:  Simethicone, Morphine and related, and Penicillins   Social History   Socioeconomic History  . Marital status: Married    Spouse name: Norris  . Number of children: Not on file  . Years of education: Not on file  . Highest education level: Not on file  Occupational History  . Not on file  Tobacco Use  . Smoking status: Never Smoker  . Smokeless tobacco: Never Used  Vaping Use  . Vaping Use: Never used  Substance and Sexual Activity  . Alcohol use: No  . Drug use: Yes    Types: Hydrocodone  . Sexual activity: Not Currently  Other Topics Concern  . Not on file  Social History Narrative  . Not on file   Social Determinants of Health   Financial Resource Strain:   . Difficulty of Paying Living Expenses:   Food Insecurity:   . Worried About Running Out of Food in the Last Year:   . Ran Out of Food in the Last Year:   Transportation Needs:   . Lack of Transportation (Medical):   . Lack of Transportation (Non-Medical):   Physical Activity:   . Days of Exercise per Week:   . Minutes of Exercise per Session:   Stress:   . Feeling of Stress :   Social Connections:   . Frequency of Communication with Friends and Family:   . Frequency of Social  Gatherings with Friends and Family:   . Attends Religious Services:   . Active Member of Clubs or Organizations:   . Attends Club or Organization Meetings:   . Marital Status:      Family History:  The patient's family history includes Breast cancer in her paternal grandmother; Breast cancer (age of onset: 30) in her maternal aunt; Cancer in her paternal aunt; Diabetes in her father; Heart attack in her father and mother; Heart disease in her father and mother.  ROS:   ROS   EKGs/Labs/Other Studies Reviewed:    Studies reviewed were summarized above. The additional studies were reviewed today:   Lexiscan MPI 08/25/2019:  T wave inversion was noted during stress in the I, aVL, V4, V5, V6, V2, aVF and V3 leads.  There was no ST segment deviation noted during stress.  The study is normal.  This is a low risk study.  The left ventricular ejection fraction is hyperdynamic (>65%). __________  2D echo 08/28/2019: 1. Left ventricular ejection fraction, by estimation, is 60 to 65%. The  left ventricle has normal function. The left ventricle has no regional  wall motion abnormalities. There is mild left ventricular hypertrophy.  Left ventricular diastolic parameters  are consistent with Grade I diastolic dysfunction (impaired relaxation).  2. Right ventricular systolic function is normal. The right ventricular  size is normal. There is mildly elevated pulmonary artery systolic  pressure. The estimated right ventricular systolic pressure is 43.9 mmHg.  3. The mitral valve is normal in structure. Mild mitral valve  regurgitation. No evidence of mitral stenosis.  4. Tricuspid valve regurgitation is mild to moderate.  5. The aortic valve is normal in structure. Aortic valve regurgitation is  not visualized. Mild to moderate aortic valve sclerosis/calcification is  present, without any evidence of aortic stenosis.  6. The inferior vena cava is normal in size with greater than 50%    respiratory variability, suggesting right atrial pressure of 3 mmHg.   EKG:  EKG is ordered today.  The EKG ordered today demonstrates NSR, 78 bpm, incomplete RBBB (previously noted), occasional   isolated PVCs  Recent Labs: 09/29/2019: BUN 29; Creatinine, Ser 1.31; Potassium 4.5; Sodium 136  Recent Lipid Panel No results found for: CHOL, TRIG, HDL, CHOLHDL, VLDL, LDLCALC, LDLDIRECT  PHYSICAL EXAM:    VS:  BP 128/72 (BP Location: Left Arm, Patient Position: Sitting, Cuff Size: Large)   Pulse 78   Ht 5' (1.524 m)   Wt 170 lb 8 oz (77.3 kg)   SpO2 98%   BMI 33.30 kg/m   BMI: Body mass index is 33.3 kg/m.  Physical Exam Constitutional:      Appearance: She is well-developed.  HENT:     Head: Normocephalic and atraumatic.  Eyes:     General:        Right eye: No discharge.        Left eye: No discharge.  Neck:     Vascular: No JVD.  Cardiovascular:     Rate and Rhythm: Normal rate and regular rhythm.     Pulses: No midsystolic click and no opening snap.          Posterior tibial pulses are 2+ on the right side and 2+ on the left side.     Heart sounds: Normal heart sounds, S1 normal and S2 normal. Heart sounds not distant. No murmur heard.  No friction rub.  Pulmonary:     Effort: Pulmonary effort is normal. No respiratory distress.     Breath sounds: Normal breath sounds. No decreased breath sounds, wheezing or rales.  Chest:     Chest wall: No tenderness.  Abdominal:     General: There is no distension.     Palpations: Abdomen is soft.     Tenderness: There is no abdominal tenderness.  Musculoskeletal:     Cervical back: Normal range of motion.  Skin:    General: Skin is warm and dry.     Nails: There is no clubbing.  Neurological:     Mental Status: She is alert and oriented to person, place, and time.  Psychiatric:        Speech: Speech normal.        Behavior: Behavior normal.        Thought Content: Thought content normal.        Judgment: Judgment normal.      Wt Readings from Last 3 Encounters:  10/06/19 170 lb 8 oz (77.3 kg)  09/29/19 168 lb (76.2 kg)  08/16/19 166 lb (75.3 kg)     Orthostatic Vital Signs:  Lying: 115/62, 81 bpm Sitting: 119/69, 81 bpm, lightheaded Standing: 115/66, 91 bpm, lightheaded Standing x 3 minutes: 126/68, 86 bpm, lightheaded, leg weakness  ASSESSMENT & PLAN:   1. CAD status post CABG with accelerating angina/pulmonary hypertension/dyspnea: She continues to note largely unchanged symptoms of exertional angina, dyspnea, and generalized fatigue which are quite lifestyle limiting and similar to her presentation leading up to her bypass surgery.  Recent Lexiscan MPI showed no significant ischemia with echo demonstrating diastolic dysfunction and mild pulmonary hypertension with the patient's symptoms being out of proportion to these findings.  Despite addition of Lasix she did not notice significant improvement in symptoms.  Given her comorbid risk factors and similar presentation with noted coronary disease at that time leading to bypass we will proceed with diagnostic [facility] with her primary cardiologist.  Continue current medications including amlodipine, aspirin, atorvastatin, and enalapril.  Risks and benefits of cardiac catheterization have been discussed with the patient including risks of bleeding, bruising, infection, kidney damage, stroke, heart attack, urgent need  for bypass, injury to limb, and death. The patient understands these risks and is willing to proceed with the procedure. All questions have been answered and concerns listened to.   2. Diastolic dysfunction/pulmonary hypertension: Symptoms are largely unchanged as outlined above.  Her weight is up 2 pounds today when compared to her last clinic weight 1 week ago with a weight trend of 158 to 170 pounds.  Continue Lasix for now.  Check CBC and BMP.  Following cardiac cath consider referral to pulmonology for PFTs and sleep study.  3. HTN: Blood pressure  is well controlled today.  Continue current medications.  4. HLD: LDL of 78 from 04/2019 with normal liver function at that time.  Remains on atorvastatin.  Following cath consider addition of Zetia or titration of Lipitor.  Disposition: F/u with Dr. Kirke Corin or an APP in 3 to 4 weeks.   Medication Adjustments/Labs and Tests Ordered: Current medicines are reviewed at length with the patient today.  Concerns regarding medicines are outlined above. Medication changes, Labs and Tests ordered today are summarized above and listed in the Patient Instructions accessible in Encounters.   Signed, Eula Listen, PA-C 10/06/2019 10:15 AM     [facility] [place] [facility] [address] [place], [place] [address] [phone]

## 2019-10-02 NOTE — H&P (View-Only) (Signed)
Cardiology Office Note    Date:  10/06/2019   ID:  Alexandria Marquez, DOB 08/21/1945, MRN [id number]  PCP:  Patrice ParadiseMcLaughlin, Miriam K, MD  Cardiologist:  Lorine BearsMuhammad Arida, MD  Electrophysiologist:  None   Chief Complaint: Follow up  History of Present Illness:   Alexandria MazeJudy W Marquez is a 74 y.o. female with history of CAD status post [facility]vessel [facility], diastolic dysfunction, mild pulmonary hypertension, DM2 since 2002, PAD status post SMA stenting for suspected chronic mesenteric ischemia in 05/2018, HTN, HLD, hypothyroidism, OSA, depression, anxiety, and GERD who presents for follow-up of dyspnea.  She was recently evaluated in 07/2019 with progressive exertional dyspnea over the preceding year that was now occurring with minimal exertion.  This was also noted to be associated with substernal chest tightness and heaviness.  She continued to have GI symptoms despite her SMA stenting, with persistent nausea.  Given these symptoms, she underwent Lexiscan MPI on 08/25/2019 which showed no significant ischemia with a hyperdynamic LVSF with an EF greater than 65% and was overall a low risk study.  Echo on 08/28/2019 showed an EF of 60 to 65%, no regional wall motion normalities, mild LVH, grade 1 diastolic dysfunction, normal RV systolic function and RV cavity size, mildly elevated PASP estimated at 43.9 mmHg, mild mitral regurgitation, mild to moderate tricuspid regurgitation, and mild to moderate aortic valve sclerosis without stenosis.  She was seen in the office on 09/29/2019 and was feeling about the same with a 12 month history of dyspnea that had been getting progressively worse.  Her symptoms did not feel similar to her presenting symptoms prior to her bypass surgery.  She was started on Lasix 20 mg daily and HCTZ was stopped.  She did note a significant second-hand smoke exposure history.   She comes in today feeling about the same as she has for the past couple of visits.  That said, she was on the campus  of [facility] on 7/15 while her husband was getting a colonoscopy and was able to get around reasonably well.  Her weight is up 2 pounds today when compared to her visit 1 week ago with a trend of 168 to 170 pounds.  She has not noted a significant change in her symptoms with addition of furosemide.  She continues to note generalized fatigue, dyspnea, and chest pressure all of which are exertional.  When asked which of the above is bothering her the most, her main complaint right now is exertional dyspnea.  Symptoms feel similar to her presentation leading up to her bypass surgery.  She does try and watch her salt intake though does consume some salty foods.   Labs independently reviewed: 09/2019 - BUN 29, SCr 1.31, potassium 4.5 04/2019 - Hgb 12.7, PLT 288, potassium 4.7, albumin 4.3, AST/ALT normal, A1c 8.2, TC 154, TG 150, HDL 46, LDL 78 01/2019 - TSH normal  Past Medical History:  Diagnosis Date  . Anxiety   . Arthritis   . Asthma    when young  . Coronary artery disease   . Depression   . Diabetes mellitus without complication (HCC)   . Environmental and seasonal allergies   . GERD (gastroesophageal reflux disease)   . History of hiatal hernia   . Hyperlipidemia   . Hypertension   . Hypothyroid   . Hypothyroidism   . Lower extremity edema   . Motion sickness   . OSA (obstructive sleep apnea) 06/04/2014  . Osteoarthritis   .  Scoliosis   . Shortness of breath dyspnea    doe    Past Surgical History:  Procedure Laterality Date  . ABDOMINAL HYSTERECTOMY    . APPENDECTOMY    . BACK SURGERY  2014   lumbar fusion/plate/rods/screws  . CHOLECYSTECTOMY  2005  . COLONOSCOPY WITH PROPOFOL N/A 01/18/2017   Procedure: COLONOSCOPY WITH PROPOFOL;  Surgeon: Christena Deem, MD;  Location: [facility] ENDOSCOPY;  Service: Endoscopy;  Laterality: N/A;  . CORONARY ARTERY BYPASS GRAFT     2008 2 vessels  . CORONARY ARTERY BYPASS GRAFT  2008  . ESOPHAGOGASTRODUODENOSCOPY  (EGD) WITH PROPOFOL N/A 05/16/2018   Procedure: ESOPHAGOGASTRODUODENOSCOPY (EGD) WITH PROPOFOL;  Surgeon: Christena Deem, MD;  Location: [facility] ENDOSCOPY;  Service: Endoscopy;  Laterality: N/A;  . FRACTURE SURGERY    . HERNIA REPAIR  1987   incisional  . JOINT REPLACEMENT Right 2017   knee  . ORIF ANKLE FRACTURE Left 06/19/2016   Procedure: OPEN REDUCTION INTERNAL FIXATION (ORIF) ANKLE FRACTURE;  Surgeon: Juanell Fairly, MD;  Location: [facility] ORS;  Service: Orthopedics;  Laterality: Left;  . rcr    . ROTATOR CUFF REPAIR Left 2013  . TONSILLECTOMY    . TOTAL KNEE ARTHROPLASTY Right 12/19/2015   Procedure: TOTAL KNEE ARTHROPLASTY;  Surgeon: Christena Flake, MD;  Location: [facility] ORS;  Service: Orthopedics;  Laterality: Right;  . TUBAL LIGATION    . VISCERAL ANGIOGRAPHY N/A 05/23/2018   Procedure: VISCERAL ANGIOGRAPHY;  Surgeon: Annice Needy, MD;  Location: [facility] INVASIVE CV LAB;  Service: Cardiovascular;  Laterality: N/A;    Current Medications: Current Meds  Medication Sig  . amLODipine (NORVASC) 5 MG tablet Take 5 mg by mouth every evening.  Marland Kitchen aspirin 81 MG tablet Take 1 tablet (81 mg total) by mouth daily.  Marland Kitchen atorvastatin (LIPITOR) 40 MG tablet Take 40 mg by mouth at bedtime.   . enalapril (VASOTEC) 2.5 MG tablet Take 2.5 mg by mouth 2 (two) times daily.  . fluticasone (FLONASE) 50 MCG/ACT nasal spray Place 1-2 sprays into both nostrils daily as needed for allergies.   . furosemide (LASIX) 20 MG tablet Take 1 tablet (20 mg total) by mouth daily.  Marland Kitchen gabapentin (NEURONTIN) 300 MG capsule Take 600 mg by mouth at bedtime.  Marland Kitchen glimepiride (AMARYL) 2 MG tablet Take 1 tablet (2 mg total) by mouth 2 (two) times daily.  Marland Kitchen glucose blood (ONETOUCH ULTRA) test strip USE 2 (TWO) TIMES DAILY. USE AS INSTRUCTED.  Marland Kitchen insulin detemir (LEVEMIR) 100 UNIT/ML injection Inject 27 Units into the skin daily.   . Insulin Pen Needle (FIFTY50 PEN NEEDLES) 31G X 5 MM MISC Use as directed  . levothyroxine (SYNTHROID,  LEVOTHROID) 75 MCG tablet Take 75 mcg by mouth daily before breakfast.  . metFORMIN (GLUCOPHAGE) 1000 MG tablet Take 1,000 mg by mouth 2 (two) times daily with a meal.  . ondansetron (ZOFRAN-ODT) 4 MG disintegrating tablet Take 4 mg by mouth every 8 (eight) hours as needed for nausea or vomiting.  . pantoprazole (PROTONIX) 40 MG tablet Take 40 mg by mouth 2 (two) times daily.  Bertram Gala Glycol-Propyl Glycol (SYSTANE) 0.4-0.3 % SOLN Place 1 drop into both eyes 3 (three) times daily as needed (for dry eyes).  . sertraline (ZOLOFT) 100 MG tablet Take 100 mg by mouth daily.  Marland Kitchen tiZANidine (ZANAFLEX) 2 MG tablet Take 2 mg by mouth every 6 (six) hours as needed for muscle spasms.  Marland Kitchen VITAMIN D PO Take by mouth daily.    Allergies:  Simethicone, Morphine and related, and Penicillins   Social History   Socioeconomic History  . Marital status: Married    Spouse name: Ranell Patrick  . Number of children: Not on file  . Years of education: Not on file  . Highest education level: Not on file  Occupational History  . Not on file  Tobacco Use  . Smoking status: Never Smoker  . Smokeless tobacco: Never Used  Vaping Use  . Vaping Use: Never used  Substance and Sexual Activity  . Alcohol use: No  . Drug use: Yes    Types: Hydrocodone  . Sexual activity: Not Currently  Other Topics Concern  . Not on file  Social History Narrative  . Not on file   Social Determinants of Health   Financial Resource Strain:   . Difficulty of Paying Living Expenses:   Food Insecurity:   . Worried About Programme researcher, broadcasting/film/video in the Last Year:   . Barista in the Last Year:   Transportation Needs:   . Freight forwarder (Medical):   Marland Kitchen Lack of Transportation (Non-Medical):   Physical Activity:   . Days of Exercise per Week:   . Minutes of Exercise per Session:   Stress:   . Feeling of Stress :   Social Connections:   . Frequency of Communication with Friends and Family:   . Frequency of Social  Gatherings with Friends and Family:   . Attends Religious Services:   . Active Member of Clubs or Organizations:   . Attends Banker Meetings:   Marland Kitchen Marital Status:      Family History:  The patient's family history includes Breast cancer in her paternal grandmother; Breast cancer (age of onset: 49) in her maternal aunt; Cancer in her paternal aunt; Diabetes in her father; Heart attack in her father and mother; Heart disease in her father and mother.  ROS:   ROS   EKGs/Labs/Other Studies Reviewed:    Studies reviewed were summarized above. The additional studies were reviewed today:   Lexiscan MPI 08/25/2019:  T wave inversion was noted during stress in the I, aVL, V4, V5, V6, V2, aVF and V3 leads.  There was no ST segment deviation noted during stress.  The study is normal.  This is a low risk study.  The left ventricular ejection fraction is hyperdynamic (>65%). __________  2D echo 08/28/2019: 1. Left ventricular ejection fraction, by estimation, is 60 to 65%. The  left ventricle has normal function. The left ventricle has no regional  wall motion abnormalities. There is mild left ventricular hypertrophy.  Left ventricular diastolic parameters  are consistent with Grade I diastolic dysfunction (impaired relaxation).  2. Right ventricular systolic function is normal. The right ventricular  size is normal. There is mildly elevated pulmonary artery systolic  pressure. The estimated right ventricular systolic pressure is 43.9 mmHg.  3. The mitral valve is normal in structure. Mild mitral valve  regurgitation. No evidence of mitral stenosis.  4. Tricuspid valve regurgitation is mild to moderate.  5. The aortic valve is normal in structure. Aortic valve regurgitation is  not visualized. Mild to moderate aortic valve sclerosis/calcification is  present, without any evidence of aortic stenosis.  6. The inferior vena cava is normal in size with greater than 50%    respiratory variability, suggesting right atrial pressure of 3 mmHg.   EKG:  EKG is ordered today.  The EKG ordered today demonstrates NSR, 78 bpm, incomplete RBBB (previously noted), occasional  isolated PVCs  Recent Labs: 09/29/2019: BUN 29; Creatinine, Ser 1.31; Potassium 4.5; Sodium 136  Recent Lipid Panel No results found for: CHOL, TRIG, HDL, CHOLHDL, VLDL, LDLCALC, LDLDIRECT  PHYSICAL EXAM:    VS:  BP 128/72 (BP Location: Left Arm, Patient Position: Sitting, Cuff Size: Large)   Pulse 78   Ht 5' (1.524 m)   Wt 170 lb 8 oz (77.3 kg)   SpO2 98%   BMI 33.30 kg/m   BMI: Body mass index is 33.3 kg/m.  Physical Exam Constitutional:      Appearance: She is well-developed.  HENT:     Head: Normocephalic and atraumatic.  Eyes:     General:        Right eye: No discharge.        Left eye: No discharge.  Neck:     Vascular: No JVD.  Cardiovascular:     Rate and Rhythm: Normal rate and regular rhythm.     Pulses: No midsystolic click and no opening snap.          Posterior tibial pulses are 2+ on the right side and 2+ on the left side.     Heart sounds: Normal heart sounds, S1 normal and S2 normal. Heart sounds not distant. No murmur heard.  No friction rub.  Pulmonary:     Effort: Pulmonary effort is normal. No respiratory distress.     Breath sounds: Normal breath sounds. No decreased breath sounds, wheezing or rales.  Chest:     Chest wall: No tenderness.  Abdominal:     General: There is no distension.     Palpations: Abdomen is soft.     Tenderness: There is no abdominal tenderness.  Musculoskeletal:     Cervical back: Normal range of motion.  Skin:    General: Skin is warm and dry.     Nails: There is no clubbing.  Neurological:     Mental Status: She is alert and oriented to person, place, and time.  Psychiatric:        Speech: Speech normal.        Behavior: Behavior normal.        Thought Content: Thought content normal.        Judgment: Judgment normal.      Wt Readings from Last 3 Encounters:  10/06/19 170 lb 8 oz (77.3 kg)  09/29/19 168 lb (76.2 kg)  08/16/19 166 lb (75.3 kg)     Orthostatic Vital Signs:  Lying: 115/62, 81 bpm Sitting: 119/69, 81 bpm, lightheaded Standing: 115/66, 91 bpm, lightheaded Standing x 3 minutes: 126/68, 86 bpm, lightheaded, leg weakness  ASSESSMENT & PLAN:   1. CAD status post CABG with accelerating angina/pulmonary hypertension/dyspnea: She continues to note largely unchanged symptoms of exertional angina, dyspnea, and generalized fatigue which are quite lifestyle limiting and similar to her presentation leading up to her bypass surgery.  Recent Lexiscan MPI showed no significant ischemia with echo demonstrating diastolic dysfunction and mild pulmonary hypertension with the patient's symptoms being out of proportion to these findings.  Despite addition of Lasix she did not notice significant improvement in symptoms.  Given her comorbid risk factors and similar presentation with noted coronary disease at that time leading to bypass we will proceed with diagnostic [facility] with her primary cardiologist.  Continue current medications including amlodipine, aspirin, atorvastatin, and enalapril.  Risks and benefits of cardiac catheterization have been discussed with the patient including risks of bleeding, bruising, infection, kidney damage, stroke, heart attack, urgent need  for bypass, injury to limb, and death. The patient understands these risks and is willing to proceed with the procedure. All questions have been answered and concerns listened to.   2. Diastolic dysfunction/pulmonary hypertension: Symptoms are largely unchanged as outlined above.  Her weight is up 2 pounds today when compared to her last clinic weight 1 week ago with a weight trend of 158 to 170 pounds.  Continue Lasix for now.  Check CBC and BMP.  Following cardiac cath consider referral to pulmonology for PFTs and sleep study.  3. HTN: Blood pressure  is well controlled today.  Continue current medications.  4. HLD: LDL of 78 from 04/2019 with normal liver function at that time.  Remains on atorvastatin.  Following cath consider addition of Zetia or titration of Lipitor.  Disposition: F/u with Dr. Kirke Corin or an APP in 3 to 4 weeks.   Medication Adjustments/Labs and Tests Ordered: Current medicines are reviewed at length with the patient today.  Concerns regarding medicines are outlined above. Medication changes, Labs and Tests ordered today are summarized above and listed in the Patient Instructions accessible in Encounters.   Signed, Eula Listen, PA-C 10/06/2019 10:15 AM     [facility] [address] [place], [place] [address] [phone]

## 2019-10-06 ENCOUNTER — Ambulatory Visit (INDEPENDENT_AMBULATORY_CARE_PROVIDER_SITE_OTHER): Payer: Medicare Other | Admitting: Physician Assistant

## 2019-10-06 ENCOUNTER — Encounter: Payer: Self-pay | Admitting: Physician Assistant

## 2019-10-06 ENCOUNTER — Other Ambulatory Visit
Admission: RE | Admit: 2019-10-06 | Discharge: 2019-10-06 | Disposition: A | Discharge status: Home / Self Care | Payer: Medicare Other | Attending: Physician Assistant | Admitting: Physician Assistant

## 2019-10-06 ENCOUNTER — Other Ambulatory Visit: Payer: Self-pay

## 2019-10-06 ENCOUNTER — Telehealth: Payer: Self-pay

## 2019-10-06 VITALS — BP 128/72 | HR 78 | Ht 60.0 in | Wt 170.5 lb

## 2019-10-06 DIAGNOSIS — E785 Hyperlipidemia, unspecified: Secondary | ICD-10-CM | POA: Diagnosis present

## 2019-10-06 DIAGNOSIS — I272 Pulmonary hypertension, unspecified: Secondary | ICD-10-CM

## 2019-10-06 DIAGNOSIS — I25118 Atherosclerotic heart disease of native coronary artery with other forms of angina pectoris: Secondary | ICD-10-CM | POA: Diagnosis not present

## 2019-10-06 DIAGNOSIS — I5189 Other ill-defined heart diseases: Secondary | ICD-10-CM

## 2019-10-06 DIAGNOSIS — I10 Essential (primary) hypertension: Secondary | ICD-10-CM | POA: Diagnosis not present

## 2019-10-06 DIAGNOSIS — I25119 Atherosclerotic heart disease of native coronary artery with unspecified angina pectoris: Secondary | ICD-10-CM

## 2019-10-06 LAB — BASIC METABOLIC PANEL
Anion gap: 12 (ref 5–15)
BUN: 38 mg/dL — ABNORMAL HIGH (ref 8–23)
CO2: 25 mmol/L (ref 22–32)
Calcium: 9 mg/dL (ref 8.9–10.3)
Chloride: 97 mmol/L — ABNORMAL LOW (ref 98–111)
Creatinine, Ser: 2.11 mg/dL — ABNORMAL HIGH (ref 0.44–1.00)
GFR calc Af Amer: 26 mL/min — ABNORMAL LOW (ref >60)
GFR calc non Af Amer: 22 mL/min — ABNORMAL LOW (ref >60)
Glucose, Bld: 286 mg/dL — ABNORMAL HIGH (ref 70–99)
Potassium: 4.5 mmol/L (ref 3.5–5.1)
Sodium: 134 mmol/L — ABNORMAL LOW (ref 135–145)

## 2019-10-06 LAB — CBC
HCT: 37.5 % (ref 36.0–46.0)
Hemoglobin: 12.3 g/dL (ref 12.0–15.0)
MCH: 30.5 pg (ref 26.0–34.0)
MCHC: 32.8 g/dL (ref 30.0–36.0)
MCV: 93.1 fL (ref 80.0–100.0)
Platelets: 319 10*3/uL (ref 150–400)
RBC: 4.03 MIL/uL (ref 3.87–5.11)
RDW: 12.7 % (ref 11.5–15.5)
WBC: 11.3 10*3/uL — ABNORMAL HIGH (ref 4.0–10.5)
nRBC: 0 % (ref 0.0–0.2)

## 2019-10-06 NOTE — Telephone Encounter (Signed)
Call to patient to review labs.    Pt verbalized understanding and has no further questions at this time.    Advised pt to call for any further questions or concerns.  Orders updated per request.  Rx list updated.

## 2019-10-06 NOTE — Telephone Encounter (Signed)
Call to patient to review POC.   Pt verbalized understanding and all questions were answered.

## 2019-10-06 NOTE — Telephone Encounter (Signed)
-----   Message from Sondra Barges, PA-C sent at 10/06/2019 12:35 PM EDT ----- Labs showed she is dehydrated. Mildly elevated WBC count though this is chronic and improved from prior.  Recommendations: Stop Lasix and enalapril Hold Metformin over the weekend Increase water intake Recheck BMET early next week in the medical mall

## 2019-10-06 NOTE — Patient Instructions (Addendum)
Medication Instructions:   Your physician recommends that you continue on your current medications as directed. Please refer to the Current Medication list given to you today.  *If you need a refill on your cardiac medications before your next appointment, please call your pharmacy*   Lab Work:  Your physician recommends that you have a lab draw today.  - Please go to the [facility]. You will check in at the front desk to the right as you walk into the atrium.    If you have labs (blood work) drawn today and your tests are completely normal, you will receive your results only by: Marland Kitchen MyChart Message (if you have MyChart) OR . A paper copy in the mail If you have any lab test that is abnormal or we need to change your treatment, we will call you to review the results.   Testing/Procedures:     [facility] CARDIOVASCULAR DIVISION [facility] [address] [place] [address] Dept: [phone] Loc: [phone]  Alexandria Marquez  10/06/2019  You are scheduled for a Cardiac Catheterization on Wednesday, July 28 with Dr. Lorine Bears.  1. Please arrive at the [facility] (Main Entrance A) at [facility]: [address] [place], [place] [address] at 10:30 AM (This time is two hours before your procedure to ensure your preparation). Free valet parking service is available.   Special note: Every effort is made to have your procedure done on time. Please understand that emergencies sometimes delay scheduled procedures.  2. Diet: Do not eat solid foods after midnight.  The patient may have clear liquids until 5am upon the Marquez of the procedure.  3. Medication instructions in preparation for your procedure:   Contrast Allergy: No   Stop taking, Enalapril/Enalaprilat (Vasotec) Tuesday, July 27, PM and July 28th  Take only 13 units of insulin the night before your procedure. Do not take any insulin on the Marquez of  the procedure.  Do not take Diabetes Med Glucophage (Metformin) on the Marquez of the procedure and HOLD 48 HOURS AFTER THE PROCEDURE.   Do not take glimepiride the morning of the procedure.  On the morning of your procedure, take your  medicines NOT listed above.  You may use sips of water.  5. Plan for one night stay--bring personal belongings. 6. Bring a current list of your medications and current insurance cards. 7. You MUST have a responsible person to drive you home. 8. Someone MUST be with you the first 24 hours after you arrive home or your discharge will be delayed. 9. Please wear clothes that are easy to get on and off and wear slip-on shoes.  Thank you for allowing [place] to care for you!   -- [facility]   Follow-Up: At [facility], you and your health needs are our priority.  As part of our continuing mission to provide you with exceptional heart care, we have created designated Provider Care Teams.  These Care Teams include your primary Cardiologist (physician) and Advanced Practice Providers (APPs -  Physician Assistants and Nurse Practitioners) who all work together to provide you with the care you need, when you need it.  We recommend signing up for the patient portal called "MyChart".  Sign up information is provided on this After Visit Summary.  MyChart is used to connect with patients for Virtual Visits (Telemedicine).  Patients are able to view lab/test results, encounter notes, upcoming appointments,  etc.  Non-urgent messages can be sent to your provider as well.   To learn more about what you can do with MyChart, go to [email or web address].    Your next appointment:   August 4, 5, 6 Marquez(s)  The format for your next appointment:   In Person  Provider:    You may see Lorine Bears, MD or one of the following Advanced Practice Providers on your designated Care Team:    Nicolasa Ducking, NP  Eula Listen, PA-C  Marisue Ivan,  PA-C  Gillian Shields, NP    Other Instructions N/A

## 2019-10-06 NOTE — Telephone Encounter (Signed)
Patient calling Would like clarification on instructions discussed Please call

## 2019-10-09 ENCOUNTER — Telehealth: Payer: Self-pay | Admitting: Cardiovascular Disease

## 2019-10-09 NOTE — Telephone Encounter (Signed)
Patient not sure if covid testing needed prior to cath   Please call

## 2019-10-09 NOTE — Telephone Encounter (Signed)
I confirmed with our office manager this morning that COVID testing is still needed prior to any procedures at [facility]/ [facility].   I have notified the patient of this. She is aware she will need to come:  - Monday 10/16/19  -  Medical Arts entrance at [facility]  - 8 am- 1 pm  I have left a message on the PAT line to please add her to the schedule for 10/16/19 for COVID testing.

## 2019-10-10 ENCOUNTER — Other Ambulatory Visit
Admission: RE | Admit: 2019-10-10 | Discharge: 2019-10-10 | Disposition: A | Discharge status: Home / Self Care | Payer: Medicare Other | Attending: Physician Assistant | Admitting: Physician Assistant

## 2019-10-10 ENCOUNTER — Other Ambulatory Visit: Payer: Self-pay

## 2019-10-10 DIAGNOSIS — I5030 Unspecified diastolic (congestive) heart failure: Secondary | ICD-10-CM | POA: Diagnosis present

## 2019-10-10 DIAGNOSIS — I272 Pulmonary hypertension, unspecified: Secondary | ICD-10-CM | POA: Diagnosis not present

## 2019-10-10 LAB — BASIC METABOLIC PANEL
Anion gap: 11 (ref 5–15)
BUN: 22 mg/dL (ref 8–23)
CO2: 26 mmol/L (ref 22–32)
Calcium: 9.8 mg/dL (ref 8.9–10.3)
Chloride: 97 mmol/L — ABNORMAL LOW (ref 98–111)
Creatinine, Ser: 1.24 mg/dL — ABNORMAL HIGH (ref 0.44–1.00)
GFR calc Af Amer: 50 mL/min — ABNORMAL LOW (ref >60)
GFR calc non Af Amer: 43 mL/min — ABNORMAL LOW (ref >60)
Glucose, Bld: 179 mg/dL — ABNORMAL HIGH (ref 70–99)
Potassium: 4.3 mmol/L (ref 3.5–5.1)
Sodium: 134 mmol/L — ABNORMAL LOW (ref 135–145)

## 2019-10-16 ENCOUNTER — Telehealth: Payer: Self-pay | Admitting: *Deleted

## 2019-10-16 ENCOUNTER — Other Ambulatory Visit: Payer: Self-pay

## 2019-10-16 ENCOUNTER — Other Ambulatory Visit
Admission: RE | Admit: 2019-10-16 | Discharge: 2019-10-16 | Disposition: A | Discharge status: Home / Self Care | Payer: Medicare Other | Source: Ambulatory Visit | Attending: Cardiovascular Disease | Admitting: Cardiovascular Disease

## 2019-10-16 DIAGNOSIS — Z20822 Contact with and (suspected) exposure to covid-19: Secondary | ICD-10-CM | POA: Diagnosis not present

## 2019-10-16 DIAGNOSIS — Z01812 Encounter for preprocedural laboratory examination: Secondary | ICD-10-CM | POA: Insufficient documentation

## 2019-10-16 LAB — SARS CORONAVIRUS 2 (TAT 6-24 HRS): SARS Coronavirus 2: NEGATIVE

## 2019-10-16 NOTE — Telephone Encounter (Signed)
Pt contacted pre-catheterization scheduled at [facility] for: Wednesday October 18, 2019 12:30 PM  Verified arrival time and place: [facility] Main Entrance [facility]) at: 7:30 AM-pre-procedure hydration    No solid food after midnight prior to cath, clear liquids until 5 AM day of procedure.  Hold: Insulin-AM of procedure Metformin-day of procedure and 48 hours post procedure. Glimepiride-AM of procedure  Lasix-currently on hold Enalapril-currently on hold  Except hold medications AM meds can be  taken pre-cath with sips of water including: ASA 81 mg   Confirmed patient has responsible adult to drive home post procedure and observe 24 hours after arriving home: yes  You are allowed ONE visitor in the waiting room during your procedure. Both you and your visitor must wear a mask once you enter the hospital.      COVID-19 Pre-Screening Questions:  . In the past 14 days have you had a new cough associated with shortness of breath, fever (100.4 or greater) or sudden loss of taste or sense of smell? no . In the past 14 days have you been around anyone with known Covid 19? no . Any international travel in the past 14 days? no . Have you been vaccinated for COVID-19? Yes, see immunization history   Reviewed procedure/mask/visitor instructions, COVID-19 screening questions with patient.

## 2019-10-18 ENCOUNTER — Encounter (HOSPITAL_COMMUNITY)
Admission: RE | Disposition: A | Discharge status: Home / Self Care | Payer: Medicare Other | Source: Home / Self Care | Attending: Cardiovascular Disease

## 2019-10-18 ENCOUNTER — Other Ambulatory Visit: Payer: Self-pay

## 2019-10-18 ENCOUNTER — Encounter (HOSPITAL_COMMUNITY): Payer: Self-pay | Admitting: Cardiovascular Disease

## 2019-10-18 ENCOUNTER — Ambulatory Visit (HOSPITAL_COMMUNITY)
Admission: RE | Admit: 2019-10-18 | Discharge: 2019-10-19 | Disposition: A | Discharge status: Home / Self Care | Payer: Medicare Other | Attending: Cardiovascular Disease | Admitting: Cardiovascular Disease

## 2019-10-18 DIAGNOSIS — I272 Pulmonary hypertension, unspecified: Secondary | ICD-10-CM | POA: Insufficient documentation

## 2019-10-18 DIAGNOSIS — I129 Hypertensive chronic kidney disease with stage 1 through stage 4 chronic kidney disease, or unspecified chronic kidney disease: Secondary | ICD-10-CM | POA: Insufficient documentation

## 2019-10-18 DIAGNOSIS — E119 Type 2 diabetes mellitus without complications: Secondary | ICD-10-CM

## 2019-10-18 DIAGNOSIS — I10 Essential (primary) hypertension: Secondary | ICD-10-CM | POA: Diagnosis present

## 2019-10-18 DIAGNOSIS — M419 Scoliosis, unspecified: Secondary | ICD-10-CM | POA: Insufficient documentation

## 2019-10-18 DIAGNOSIS — F329 Major depressive disorder, single episode, unspecified: Secondary | ICD-10-CM | POA: Insufficient documentation

## 2019-10-18 DIAGNOSIS — I208 Other forms of angina pectoris: Secondary | ICD-10-CM | POA: Diagnosis present

## 2019-10-18 DIAGNOSIS — Z79899 Other long term (current) drug therapy: Secondary | ICD-10-CM | POA: Diagnosis not present

## 2019-10-18 DIAGNOSIS — E1122 Type 2 diabetes mellitus with diabetic chronic kidney disease: Secondary | ICD-10-CM | POA: Diagnosis not present

## 2019-10-18 DIAGNOSIS — N189 Chronic kidney disease, unspecified: Secondary | ICD-10-CM | POA: Diagnosis not present

## 2019-10-18 DIAGNOSIS — F419 Anxiety disorder, unspecified: Secondary | ICD-10-CM | POA: Insufficient documentation

## 2019-10-18 DIAGNOSIS — I2510 Atherosclerotic heart disease of native coronary artery without angina pectoris: Secondary | ICD-10-CM | POA: Diagnosis present

## 2019-10-18 DIAGNOSIS — Z951 Presence of aortocoronary bypass graft: Secondary | ICD-10-CM | POA: Insufficient documentation

## 2019-10-18 DIAGNOSIS — Z880 Allergy status to penicillin: Secondary | ICD-10-CM | POA: Insufficient documentation

## 2019-10-18 DIAGNOSIS — K219 Gastro-esophageal reflux disease without esophagitis: Secondary | ICD-10-CM | POA: Diagnosis present

## 2019-10-18 DIAGNOSIS — Z794 Long term (current) use of insulin: Secondary | ICD-10-CM | POA: Diagnosis not present

## 2019-10-18 DIAGNOSIS — G4733 Obstructive sleep apnea (adult) (pediatric): Secondary | ICD-10-CM | POA: Insufficient documentation

## 2019-10-18 DIAGNOSIS — E785 Hyperlipidemia, unspecified: Secondary | ICD-10-CM | POA: Diagnosis present

## 2019-10-18 DIAGNOSIS — E039 Hypothyroidism, unspecified: Secondary | ICD-10-CM | POA: Insufficient documentation

## 2019-10-18 DIAGNOSIS — Z7982 Long term (current) use of aspirin: Secondary | ICD-10-CM | POA: Insufficient documentation

## 2019-10-18 DIAGNOSIS — I25118 Atherosclerotic heart disease of native coronary artery with other forms of angina pectoris: Secondary | ICD-10-CM | POA: Diagnosis not present

## 2019-10-18 DIAGNOSIS — Z885 Allergy status to narcotic agent status: Secondary | ICD-10-CM | POA: Diagnosis not present

## 2019-10-18 DIAGNOSIS — Z955 Presence of coronary angioplasty implant and graft: Secondary | ICD-10-CM

## 2019-10-18 HISTORY — PX: RIGHT/LEFT HEART CATH AND CORONARY/GRAFT ANGIOGRAPHY: CATH118267

## 2019-10-18 HISTORY — PX: CORONARY STENT INTERVENTION: CATH118234

## 2019-10-18 LAB — POCT I-STAT EG7
Acid-Base Excess: 0 mmol/L (ref 0.0–2.0)
Bicarbonate: 25.3 mmol/L (ref 20.0–28.0)
Calcium, Ion: 1.25 mmol/L (ref 1.15–1.40)
HCT: 37 % (ref 36.0–46.0)
Hemoglobin: 12.6 g/dL (ref 12.0–15.0)
O2 Saturation: 79 %
Potassium: 3.9 mmol/L (ref 3.5–5.1)
Sodium: 142 mmol/L (ref 135–145)
TCO2: 27 mmol/L (ref 22–32)
pCO2, Ven: 44.3 mmHg (ref 44.0–60.0)
pH, Ven: 7.364 (ref 7.250–7.430)
pO2, Ven: 45 mmHg (ref 32.0–45.0)

## 2019-10-18 LAB — GLUCOSE, CAPILLARY
Glucose-Capillary: 139 mg/dL — ABNORMAL HIGH (ref 70–99)
Glucose-Capillary: 148 mg/dL — ABNORMAL HIGH (ref 70–99)
Glucose-Capillary: 185 mg/dL — ABNORMAL HIGH (ref 70–99)

## 2019-10-18 LAB — POCT I-STAT 7, (LYTES, BLD GAS, ICA,H+H)
Acid-Base Excess: 0 mmol/L (ref 0.0–2.0)
Bicarbonate: 24.6 mmol/L (ref 20.0–28.0)
Calcium, Ion: 1.2 mmol/L (ref 1.15–1.40)
HCT: 37 % (ref 36.0–46.0)
Hemoglobin: 12.6 g/dL (ref 12.0–15.0)
O2 Saturation: 99 %
Potassium: 3.9 mmol/L (ref 3.5–5.1)
Sodium: 142 mmol/L (ref 135–145)
TCO2: 26 mmol/L (ref 22–32)
pCO2 arterial: 40.9 mmHg (ref 32.0–48.0)
pH, Arterial: 7.386 (ref 7.350–7.450)
pO2, Arterial: 149 mmHg — ABNORMAL HIGH (ref 83.0–108.0)

## 2019-10-18 LAB — POCT ACTIVATED CLOTTING TIME: Activated Clotting Time: 290 seconds

## 2019-10-18 SURGERY — RIGHT/LEFT HEART CATH AND CORONARY/GRAFT ANGIOGRAPHY
Anesthesia: LOCAL

## 2019-10-18 MED ORDER — VERAPAMIL HCL 2.5 MG/ML IV SOLN
INTRAVENOUS | Status: AC
  Filled 2019-10-18: qty 2

## 2019-10-18 MED ORDER — LABETALOL HCL 5 MG/ML IV SOLN
INTRAVENOUS | Status: AC
  Filled 2019-10-18: qty 4

## 2019-10-18 MED ORDER — SODIUM CHLORIDE 0.9% FLUSH: 3.0000 mL | INTRAVENOUS | Status: DC | PRN

## 2019-10-18 MED ORDER — CLOPIDOGREL BISULFATE 300 MG PO TABS
ORAL_TABLET | ORAL | Status: AC
  Filled 2019-10-18: qty 2

## 2019-10-18 MED ORDER — HEPARIN (PORCINE) IN NACL 1000-0.9 UT/500ML-% IV SOLN
INTRAVENOUS | Status: DC | PRN
  Administered 2019-10-18 (×2): 500 mL

## 2019-10-18 MED ORDER — SODIUM CHLORIDE 0.9 % WEIGHT BASED INFUSION: 1.0000 mL/kg/h | INTRAVENOUS | Status: AC

## 2019-10-18 MED ORDER — POLYVINYL ALCOHOL 1.4 % OP SOLN
1.0000 [drp] | Freq: Three times a day (TID) | OPHTHALMIC | Status: DC | PRN
  Filled 2019-10-18: qty 15

## 2019-10-18 MED ORDER — MIDAZOLAM HCL 2 MG/2ML IJ SOLN
INTRAMUSCULAR | Status: AC
  Filled 2019-10-18: qty 2

## 2019-10-18 MED ORDER — ACETAMINOPHEN 325 MG PO TABS
650.0000 mg | ORAL_TABLET | ORAL | Status: DC | PRN
  Administered 2019-10-18: 650 mg via ORAL
  Filled 2019-10-18: qty 2

## 2019-10-18 MED ORDER — GLIMEPIRIDE 1 MG PO TABS
2.0000 mg | ORAL_TABLET | Freq: Every day | ORAL | Status: DC
  Administered 2019-10-19: 2 mg via ORAL
  Filled 2019-10-18: qty 2

## 2019-10-18 MED ORDER — SODIUM CHLORIDE 0.9 % IV SOLN: 250.0000 mL | INTRAVENOUS | Status: DC | PRN

## 2019-10-18 MED ORDER — HEPARIN (PORCINE) IN NACL 1000-0.9 UT/500ML-% IV SOLN
INTRAVENOUS | Status: AC
  Filled 2019-10-18: qty 1000

## 2019-10-18 MED ORDER — PANTOPRAZOLE SODIUM 40 MG PO TBEC
40.0000 mg | DELAYED_RELEASE_TABLET | Freq: Every day | ORAL | Status: DC
  Administered 2019-10-19: 40 mg via ORAL
  Filled 2019-10-18: qty 1

## 2019-10-18 MED ORDER — SODIUM CHLORIDE 0.9% FLUSH
3.0000 mL | Freq: Two times a day (BID) | INTRAVENOUS | Status: DC
  Administered 2019-10-18 – 2019-10-19 (×2): 3 mL via INTRAVENOUS

## 2019-10-18 MED ORDER — ASPIRIN 81 MG PO CHEW: 81.0000 mg | CHEWABLE_TABLET | ORAL | Status: DC

## 2019-10-18 MED ORDER — HEPARIN SODIUM (PORCINE) 1000 UNIT/ML IJ SOLN
INTRAMUSCULAR | Status: AC
  Filled 2019-10-18: qty 1

## 2019-10-18 MED ORDER — INSULIN ASPART 100 UNIT/ML ~~LOC~~ SOLN: 0.0000 [IU] | Freq: Every day | SUBCUTANEOUS | Status: DC

## 2019-10-18 MED ORDER — IOHEXOL 350 MG/ML SOLN
INTRAVENOUS | Status: AC
  Filled 2019-10-18: qty 1

## 2019-10-18 MED ORDER — SODIUM CHLORIDE 0.9 % WEIGHT BASED INFUSION: 1.0000 mL/kg/h | INTRAVENOUS | Status: DC

## 2019-10-18 MED ORDER — GABAPENTIN 300 MG PO CAPS
600.0000 mg | ORAL_CAPSULE | Freq: Every day | ORAL | Status: DC
  Administered 2019-10-18: 600 mg via ORAL
  Filled 2019-10-18: qty 2

## 2019-10-18 MED ORDER — TIZANIDINE HCL 2 MG PO TABS
2.0000 mg | ORAL_TABLET | Freq: Four times a day (QID) | ORAL | Status: DC | PRN
  Administered 2019-10-18: 2 mg via ORAL
  Filled 2019-10-18 (×3): qty 1

## 2019-10-18 MED ORDER — SERTRALINE HCL 100 MG PO TABS
100.0000 mg | ORAL_TABLET | Freq: Every day | ORAL | Status: DC
  Administered 2019-10-18 – 2019-10-19 (×2): 100 mg via ORAL
  Filled 2019-10-18 (×2): qty 1

## 2019-10-18 MED ORDER — IOHEXOL 350 MG/ML SOLN
INTRAVENOUS | Status: DC | PRN
  Administered 2019-10-18: 155 mL

## 2019-10-18 MED ORDER — NITROGLYCERIN 1 MG/10 ML FOR IR/CATH LAB
INTRA_ARTERIAL | Status: DC | PRN
  Administered 2019-10-18 (×2): 200 ug
  Administered 2019-10-18: 100 ug

## 2019-10-18 MED ORDER — CARVEDILOL 6.25 MG PO TABS
6.2500 mg | ORAL_TABLET | Freq: Two times a day (BID) | ORAL | Status: DC
  Administered 2019-10-18 – 2019-10-19 (×2): 6.25 mg via ORAL
  Filled 2019-10-18 (×2): qty 1

## 2019-10-18 MED ORDER — ASPIRIN EC 81 MG PO TBEC
81.0000 mg | DELAYED_RELEASE_TABLET | Freq: Every day | ORAL | Status: DC
  Administered 2019-10-19: 81 mg via ORAL
  Filled 2019-10-18: qty 1

## 2019-10-18 MED ORDER — HEPARIN SODIUM (PORCINE) 1000 UNIT/ML IJ SOLN
INTRAMUSCULAR | Status: DC | PRN
  Administered 2019-10-18: 5000 [IU] via INTRAVENOUS

## 2019-10-18 MED ORDER — POLYETHYL GLYCOL-PROPYL GLYCOL 0.4-0.3 % OP SOLN: 1.0000 [drp] | Freq: Three times a day (TID) | OPHTHALMIC | Status: DC | PRN

## 2019-10-18 MED ORDER — SODIUM CHLORIDE 0.9 % WEIGHT BASED INFUSION
3.0000 mL/kg/h | INTRAVENOUS | Status: DC
  Administered 2019-10-18: 3 mL/kg/h via INTRAVENOUS

## 2019-10-18 MED ORDER — CLOPIDOGREL BISULFATE 75 MG PO TABS
75.0000 mg | ORAL_TABLET | Freq: Every day | ORAL | Status: DC
  Administered 2019-10-19: 75 mg via ORAL
  Filled 2019-10-18 (×2): qty 1

## 2019-10-18 MED ORDER — CLOPIDOGREL BISULFATE 300 MG PO TABS
ORAL_TABLET | ORAL | Status: DC | PRN
  Administered 2019-10-18: 600 mg via ORAL

## 2019-10-18 MED ORDER — NITROGLYCERIN 1 MG/10 ML FOR IR/CATH LAB
INTRA_ARTERIAL | Status: AC
  Filled 2019-10-18: qty 10

## 2019-10-18 MED ORDER — ONDANSETRON 4 MG PO TBDP
4.0000 mg | ORAL_TABLET | Freq: Three times a day (TID) | ORAL | Status: DC | PRN
  Administered 2019-10-18: 4 mg via ORAL
  Filled 2019-10-18 (×3): qty 1

## 2019-10-18 MED ORDER — FLUTICASONE PROPIONATE 50 MCG/ACT NA SUSP
1.0000 | Freq: Every day | NASAL | Status: DC | PRN
  Filled 2019-10-18: qty 16

## 2019-10-18 MED ORDER — AMLODIPINE BESYLATE 5 MG PO TABS: 5.0000 mg | ORAL_TABLET | Freq: Every evening | ORAL | Status: DC

## 2019-10-18 MED ORDER — SODIUM CHLORIDE 0.9% FLUSH
3.0000 mL | Freq: Two times a day (BID) | INTRAVENOUS | Status: DC
  Administered 2019-10-19: 3 mL via INTRAVENOUS

## 2019-10-18 MED ORDER — LIDOCAINE HCL (PF) 1 % IJ SOLN
INTRAMUSCULAR | Status: DC | PRN
  Administered 2019-10-18: 5 mL

## 2019-10-18 MED ORDER — MIDAZOLAM HCL 2 MG/2ML IJ SOLN
INTRAMUSCULAR | Status: DC | PRN
  Administered 2019-10-18 (×2): 1 mg via INTRAVENOUS

## 2019-10-18 MED ORDER — LABETALOL HCL 5 MG/ML IV SOLN
10.0000 mg | INTRAVENOUS | Status: AC | PRN
  Administered 2019-10-18 (×2): 10 mg via INTRAVENOUS
  Filled 2019-10-18: qty 4

## 2019-10-18 MED ORDER — LIDOCAINE HCL (PF) 1 % IJ SOLN
INTRAMUSCULAR | Status: AC
  Filled 2019-10-18: qty 30

## 2019-10-18 MED ORDER — ATORVASTATIN CALCIUM 40 MG PO TABS
40.0000 mg | ORAL_TABLET | Freq: Every day | ORAL | Status: DC
  Administered 2019-10-18: 40 mg via ORAL
  Filled 2019-10-18: qty 1

## 2019-10-18 MED ORDER — VERAPAMIL HCL 2.5 MG/ML IV SOLN
INTRAVENOUS | Status: DC | PRN
  Administered 2019-10-18: 10 mL via INTRA_ARTERIAL

## 2019-10-18 MED ORDER — FENTANYL CITRATE (PF) 100 MCG/2ML IJ SOLN
INTRAMUSCULAR | Status: AC
  Filled 2019-10-18: qty 2

## 2019-10-18 MED ORDER — LEVOTHYROXINE SODIUM 75 MCG PO TABS
75.0000 ug | ORAL_TABLET | Freq: Every day | ORAL | Status: DC
  Administered 2019-10-19: 75 ug via ORAL
  Filled 2019-10-18: qty 1

## 2019-10-18 MED ORDER — INSULIN ASPART 100 UNIT/ML ~~LOC~~ SOLN
0.0000 [IU] | Freq: Three times a day (TID) | SUBCUTANEOUS | Status: DC
  Administered 2019-10-18 – 2019-10-19 (×2): 1 [IU] via SUBCUTANEOUS

## 2019-10-18 MED ORDER — FENTANYL CITRATE (PF) 100 MCG/2ML IJ SOLN
INTRAMUSCULAR | Status: DC | PRN
  Administered 2019-10-18: 25 ug via INTRAVENOUS

## 2019-10-18 SURGICAL SUPPLY — 20 items
CATH INFINITI 5 FR IM (CATHETERS) ×2 IMPLANT
CATH INFINITI 5 FR LCB (CATHETERS) ×2 IMPLANT
CATH INFINITI 5FR MULTPACK ANG (CATHETERS) ×2 IMPLANT
CATH LAUNCHER 6FR JR4 (CATHETERS) ×2 IMPLANT
CATH SWAN GANZ 7F STRAIGHT (CATHETERS) ×2 IMPLANT
DEVICE RAD COMP TR BAND LRG (VASCULAR PRODUCTS) ×2 IMPLANT
GLIDESHEATH SLEND SS 6F .021 (SHEATH) ×2 IMPLANT
GLIDESHEATH SLENDER 7FR .021G (SHEATH) ×2 IMPLANT
GUIDEWIRE INQWIRE 1.5J.035X260 (WIRE) ×1 IMPLANT
INQWIRE 1.5J .035X260CM (WIRE) ×2
KIT ENCORE 26 ADVANTAGE (KITS) ×2 IMPLANT
KIT HEART LEFT (KITS) ×2 IMPLANT
PACK CARDIAC CATHETERIZATION (CUSTOM PROCEDURE TRAY) ×2 IMPLANT
SHEATH PROBE COVER 6X72 (BAG) ×2 IMPLANT
STENT RESOLUTE ONYX 3.0X12 (Permanent Stent) ×2 IMPLANT
STENT RESOLUTE ONYX 3.0X18 (Permanent Stent) ×2 IMPLANT
TRANSDUCER W/STOPCOCK (MISCELLANEOUS) ×2 IMPLANT
TUBING CIL FLEX 10 FLL-RA (TUBING) ×2 IMPLANT
WIRE EMERALD 3MM-J .025X260CM (WIRE) ×2 IMPLANT
WIRE RUNTHROUGH .014X180CM (WIRE) ×2 IMPLANT

## 2019-10-18 NOTE — Interval H&P Note (Signed)
Cath Lab Visit (complete for each Cath Lab visit)  Clinical Evaluation Leading to the Procedure:   ACS: No.  Non-ACS:    Anginal Classification: CCS III  Anti-ischemic medical therapy: Maximal Therapy (2 or more classes of medications)  Non-Invasive Test Results: Low-risk stress test findings: cardiac mortality <1%/year  Prior CABG: Previous CABG      History and Physical Interval Note:  10/18/2019 1:13 PM  Alexandria Marquez  has presented today for surgery, with the diagnosis of Angina.  The various methods of treatment have been discussed with the patient and family. After consideration of risks, benefits and other options for treatment, the patient has consented to  Procedure(s): RIGHT/LEFT HEART CATH AND CORONARY/GRAFT ANGIOGRAPHY (N/A) as a surgical intervention.  The patient's history has been reviewed, patient examined, no change in status, stable for surgery.  I have reviewed the patient's chart and labs.  Questions were answered to the patient's satisfaction.     Lorine Bears

## 2019-10-18 NOTE — Research (Signed)
[place] Informed Consent   Subject Name: Alexandria Marquez  Subject met inclusion and exclusion criteria.  The informed consent form, study requirements and expectations were reviewed with the subject and questions and concerns were addressed prior to the signing of the consent form.  The subject verbalized understanding of the trail requirements.  The subject agreed to participate in the [facility] trial and signed the informed consent.  The informed consent was obtained prior to performance of any protocol-specific procedures for the subject.  A copy of the signed informed consent was given to the subject and a copy was placed in the subject's medical record.  [name] 10/18/2019, 10:08 AM

## 2019-10-19 ENCOUNTER — Encounter (HOSPITAL_COMMUNITY): Payer: Self-pay | Admitting: Cardiovascular Disease

## 2019-10-19 DIAGNOSIS — I272 Pulmonary hypertension, unspecified: Secondary | ICD-10-CM | POA: Diagnosis not present

## 2019-10-19 DIAGNOSIS — I208 Other forms of angina pectoris: Secondary | ICD-10-CM | POA: Diagnosis not present

## 2019-10-19 DIAGNOSIS — I10 Essential (primary) hypertension: Secondary | ICD-10-CM

## 2019-10-19 DIAGNOSIS — I25118 Atherosclerotic heart disease of native coronary artery with other forms of angina pectoris: Secondary | ICD-10-CM | POA: Diagnosis not present

## 2019-10-19 DIAGNOSIS — Z951 Presence of aortocoronary bypass graft: Secondary | ICD-10-CM | POA: Diagnosis not present

## 2019-10-19 DIAGNOSIS — I129 Hypertensive chronic kidney disease with stage 1 through stage 4 chronic kidney disease, or unspecified chronic kidney disease: Secondary | ICD-10-CM | POA: Diagnosis not present

## 2019-10-19 LAB — CBC
HCT: 31.5 % — ABNORMAL LOW (ref 36.0–46.0)
Hemoglobin: 10.4 g/dL — ABNORMAL LOW (ref 12.0–15.0)
MCH: 29.8 pg (ref 26.0–34.0)
MCHC: 33 g/dL (ref 30.0–36.0)
MCV: 90.3 fL (ref 80.0–100.0)
Platelets: 246 10*3/uL (ref 150–400)
RBC: 3.49 MIL/uL — ABNORMAL LOW (ref 3.87–5.11)
RDW: 12.9 % (ref 11.5–15.5)
WBC: 7.8 10*3/uL (ref 4.0–10.5)
nRBC: 0 % (ref 0.0–0.2)

## 2019-10-19 LAB — LIPID PANEL
Cholesterol: 131 mg/dL (ref 0–200)
HDL: 37 mg/dL — ABNORMAL LOW (ref >40)
LDL Cholesterol: 70 mg/dL (ref 0–99)
Total CHOL/HDL Ratio: 3.5 RATIO
Triglycerides: 120 mg/dL (ref <150)
VLDL: 24 mg/dL (ref 0–40)

## 2019-10-19 LAB — BASIC METABOLIC PANEL
Anion gap: 9 (ref 5–15)
BUN: 18 mg/dL (ref 8–23)
CO2: 24 mmol/L (ref 22–32)
Calcium: 9 mg/dL (ref 8.9–10.3)
Chloride: 104 mmol/L (ref 98–111)
Creatinine, Ser: 1.07 mg/dL — ABNORMAL HIGH (ref 0.44–1.00)
GFR calc Af Amer: 59 mL/min — ABNORMAL LOW (ref >60)
GFR calc non Af Amer: 51 mL/min — ABNORMAL LOW (ref >60)
Glucose, Bld: 104 mg/dL — ABNORMAL HIGH (ref 70–99)
Potassium: 3.9 mmol/L (ref 3.5–5.1)
Sodium: 137 mmol/L (ref 135–145)

## 2019-10-19 LAB — GLUCOSE, CAPILLARY
Glucose-Capillary: 117 mg/dL — ABNORMAL HIGH (ref 70–99)
Glucose-Capillary: 138 mg/dL — ABNORMAL HIGH (ref 70–99)

## 2019-10-19 MED ORDER — AMLODIPINE BESYLATE 5 MG PO TABS: 5.0000 mg | ORAL_TABLET | Freq: Two times a day (BID) | ORAL | Status: AC

## 2019-10-19 MED ORDER — NITROGLYCERIN 0.4 MG SL SUBL: 0.4000 mg | SUBLINGUAL_TABLET | SUBLINGUAL | 3 refills | Status: AC | PRN

## 2019-10-19 MED ORDER — CLOPIDOGREL BISULFATE 75 MG PO TABS: 75.0000 mg | ORAL_TABLET | Freq: Every day | ORAL | 3 refills | Status: DC

## 2019-10-19 MED ORDER — CARVEDILOL 6.25 MG PO TABS: 6.2500 mg | ORAL_TABLET | Freq: Two times a day (BID) | ORAL | 3 refills | Status: DC

## 2019-10-19 MED ORDER — AMLODIPINE BESYLATE 5 MG PO TABS
5.0000 mg | ORAL_TABLET | Freq: Every evening | ORAL | Status: DC
  Administered 2019-10-19: 5 mg via ORAL
  Filled 2019-10-19: qty 1

## 2019-10-19 MED ORDER — ATORVASTATIN CALCIUM 80 MG PO TABS: 80.0000 mg | ORAL_TABLET | Freq: Every day | ORAL | 3 refills | Status: DC

## 2019-10-19 MED ORDER — METFORMIN HCL 1000 MG PO TABS: 1000.0000 mg | ORAL_TABLET | Freq: Two times a day (BID) | ORAL | Status: AC

## 2019-10-19 MED FILL — NITROGLYCERIN 0.4 MG TAB SL: 0.4 | 8 days supply | Qty: 25 | Fill #0

## 2019-10-19 MED FILL — CLOPIDOGREL 75 MG TABLET: 75 | 30 days supply | Qty: 30 | Fill #0

## 2019-10-19 MED FILL — ATORVASTATIN CALCIUM 80 MG: 80 | 30 days supply | Qty: 30 | Fill #0

## 2019-10-19 MED FILL — CARVEDILOL 6.25 MG TABLET: 6.25 | 30 days supply | Qty: 60 | Fill #0

## 2019-10-19 NOTE — Progress Notes (Signed)
TR BAND REMOVAL  LOCATION:    left radial  DEFLATED PER PROTOCOL:    Yes.    TIME BAND OFF / DRESSING APPLIED:    2030   SITE UPON ARRIVAL:    Level 0  SITE AFTER BAND REMOVAL:    Level 0  CIRCULATION SENSATION AND MOVEMENT:    Within Normal Limits   Yes.    COMMENTS:   Tolerated procedure well. Post TRB instructions /care given with understanding.

## 2019-10-19 NOTE — Progress Notes (Signed)
CARDIAC REHAB PHASE I   PRE:  Rate/Rhythm: 66 SR    BP: sitting 160/75    SaO2:   MODE:  Ambulation: 430 ft   POST:  Rate/Rhythm: 93 SR    BP: sitting 173/68     SaO2: 99 RA  Pt ambulated slowly. Sts she is much less SOB but still somewhat due to deconditioning. She also gets easily lightheaded. Sts this is chronic. Discussed Plavix, stent, restrictions, diet, exercise, NTG and CRPII. Pt receptive. She does not like to exercise. I encouraged her to increase exercise slowly and set small and large goals. Will refer to [facility] CRPII. She understands the importance of Plavix. [id number]   Harriet Masson CES, ACSM 10/19/2019 8:56 AM

## 2019-10-19 NOTE — Discharge Instructions (Signed)
Radial Site Care  This sheet gives you information about how to care for yourself after your procedure. Your health care provider may also give you more specific instructions. If you have problems or questions, contact your health care provider. What can I expect after the procedure? After the procedure, it is common to have:  Bruising and tenderness at the catheter insertion area. Follow these instructions at home: Medicines  Take over-the-counter and prescription medicines only as told by your health care provider. Insertion site care  Follow instructions from your health care provider about how to take care of your insertion site. Make sure you: ? Wash your hands with soap and water before you change your bandage (dressing). If soap and water are not available, use hand sanitizer. ? Change your dressing as told by your health care provider. ? Leave stitches (sutures), skin glue, or adhesive strips in place. These skin closures may need to stay in place for 2 weeks or longer. If adhesive strip edges start to loosen and curl up, you may trim the loose edges. Do not remove adhesive strips completely unless your health care provider tells you to do that.  Check your insertion site every day for signs of infection. Check for: ? Redness, swelling, or pain. ? Fluid or blood. ? Pus or a bad smell. ? Warmth.  Do not take baths, swim, or use a hot tub until your health care provider approves.  You may shower 24-48 hours after the procedure, or as directed by your health care provider. ? Remove the dressing and gently wash the site with plain soap and water. ? Pat the area dry with a clean towel. ? Do not rub the site. That could cause bleeding.  Do not apply powder or lotion to the site. Activity   For 24 hours after the procedure, or as directed by your health care provider: ? Do not flex or bend the affected arm. ? Do not push or pull heavy objects with the affected arm. ? Do not  drive yourself home from the hospital or clinic. You may drive 24 hours after the procedure unless your health care provider tells you not to. ? Do not operate machinery or power tools.  Do not lift anything that is heavier than 10 lb (4.5 kg), or the limit that you are told, until your health care provider says that it is safe.  Ask your health care provider when it is okay to: ? Return to work or school. ? Resume usual physical activities or sports. ? Resume sexual activity. General instructions  If the catheter site starts to bleed, raise your arm and put firm pressure on the site. If the bleeding does not stop, get help right away. This is a medical emergency.  If you went home on the same day as your procedure, a responsible adult should be with you for the first 24 hours after you arrive home.  Keep all follow-up visits as told by your health care provider. This is important. Contact a health care provider if:  You have a fever.  You have redness, swelling, or yellow drainage around your insertion site. Get help right away if:  You have unusual pain at the radial site.  The catheter insertion area swells very fast.  The insertion area is bleeding, and the bleeding does not stop when you hold steady pressure on the area.  Your arm or hand becomes pale, cool, tingly, or numb. These symptoms may represent a serious problem   that is an emergency. Do not wait to see if the symptoms will go away. Get medical help right away. Call your local emergency services (911 in the [place] Do not drive yourself to the hospital. Summary  After the procedure, it is common to have bruising and tenderness at the site.  Follow instructions from your health care provider about how to take care of your radial site wound. Check the wound every day for signs of infection.  Do not lift anything that is heavier than 10 lb (4.5 kg), or the limit that you are told, until your health care provider says  that it is safe. This information is not intended to replace advice given to you by your health care provider. Make sure you discuss any questions you have with your health care provider. Document Revised: 04/14/2017 Document Reviewed: 04/14/2017 Elsevier Patient Education  2020 Elsevier Inc.  

## 2019-10-19 NOTE — Discharge Summary (Addendum)
Discharge Summary    Patient ID: Alexandria Marquez MRN: [id number]; DOB: 03-Aug-1945  Admit date: 10/18/2019 Discharge date: 10/19/2019  Primary Care Provider: Patrice Paradise, MD  Primary Cardiologist: Lorine Bears, MD  Primary Electrophysiologist:  None   Discharge Diagnoses    Principal Problem:   Effort angina [facility]) Active Problems:   Diabetes (HCC)   CAD (coronary artery disease)   HTN (hypertension)   HLD (hyperlipidemia)   GERD (gastroesophageal reflux disease)   Poorly-controlled hypertension   Type 2 diabetes mellitus not at goal [facility])   Pulmonary hypertension, unspecified (HCC)   Diagnostic Studies/Procedures    Right and left heart cath 10/18/19: The left ventricular systolic function is normal. LV end diastolic pressure is normal. The left ventricular ejection fraction is 55-65% by visual estimate. Mid RCA lesion is 85% stenosed. Post intervention, there is a 0% residual stenosis. SVG graft was visualized by angiography and is normal in caliber and anatomically normal. The graft exhibits no disease. Mid LAD-1 lesion is 85% stenosed. Mid LAD-2 lesion is 80% stenosed. Mid Cx to Dist Cx lesion is 60% stenosed. Prox LAD to Mid LAD lesion is 100% stenosed. LIMA and is small. Prox Graft to Dist Graft lesion is 50% stenosed. A drug-eluting stent was successfully placed using a STENT RESOLUTE ONYX 3.0X18.   1.  Significant underlying three-vessel coronary artery disease with patent LIMA to LAD and SVG to second diagonal.  The SVG to second diagonal provides competitive flow to the LAD.  The LIMA itself is of a small caliber with moderate diffuse disease in the mid and distal segment.  There is moderate stenosis in the mid to distal left circumflex which was not well visualized given that the left main could not be selectively engaged.  There was significant mid RCA stenosis in a large vessel.  The RCA was not bypassed. 2.  Normal LV systolic function. 3.  Right heart  catheterization showed normal right and left-sided filling pressures, mild pulmonary hypertension normal cardiac output.  Severe systemic hypertension with systolic blood pressure around 200. 4.  Successful drug-eluting stent placement to the mid right coronary artery.  A second overlapping stent was placed proximally due to proximal edge dissection.   Recommendations: Dual antiplatelet therapy for at least 1 year. Aggressive treatment of risk factors. Blood pressure control.  I elected to add carvedilol. Hydrate overnight given underlying chronic kidney disease.  150 mL of contrast was used.  _____________   Echo 08/28/19:  1. Left ventricular ejection fraction, by estimation, is 60 to 65%. The  left ventricle has normal function. The left ventricle has no regional  wall motion abnormalities. There is mild left ventricular hypertrophy.  Left ventricular diastolic parameters  are consistent with Grade I diastolic dysfunction (impaired relaxation).   2. Right ventricular systolic function is normal. The right ventricular  size is normal. There is mildly elevated pulmonary artery systolic  pressure. The estimated right ventricular systolic pressure is 43.9 mmHg.   3. The mitral valve is normal in structure. Mild mitral valve  regurgitation. No evidence of mitral stenosis.   4. Tricuspid valve regurgitation is mild to moderate.   5. The aortic valve is normal in structure. Aortic valve regurgitation is  not visualized. Mild to moderate aortic valve sclerosis/calcification is  present, without any evidence of aortic stenosis.   6. The inferior vena cava is normal in size with greater than 50%  respiratory variability, suggesting right atrial pressure of 3 mmHg.   History of Present  Illness     Alexandria Marquez is a 74 y.o. female with history of CAD status post two[facility]vessel CABG [facility] in [facility], diastolic dysfunction, mild pulmonary hypertension, DM2 since 2002, PAD status post SMA stenting for  suspected chronic mesenteric ischemia in 05/2018, HTN, HLD, hypothyroidism, OSA, depression, anxiety, and GERD who presents for follow-up of dyspnea.   She was recently evaluated in 07/2019 with progressive exertional dyspnea over the preceding year that was now occurring with minimal exertion.  This was also noted to be associated with substernal chest tightness and heaviness.  She continued to have GI symptoms despite her SMA stenting, with persistent nausea.  Given these symptoms, she underwent Lexiscan MPI on 08/25/2019 which showed no significant ischemia with a hyperdynamic LVSF with an EF greater than 65% and was overall a low risk study.  Echo on 08/28/2019 showed an EF of 60 to 65%, no regional wall motion normalities, mild LVH, grade 1 diastolic dysfunction, normal RV systolic function and RV cavity size, mildly elevated PASP estimated at 43.9 mmHg, mild mitral regurgitation, mild to moderate tricuspid regurgitation, and mild to moderate aortic valve sclerosis without stenosis.  She was seen in the office on 09/29/2019 and was feeling about the same with a 12 month history of dyspnea that had been getting progressively worse.  Her symptoms did not feel similar to her presenting symptoms prior to her bypass surgery.  She was started on Lasix 20 mg daily and HCTZ was stopped.  She did note a significant second-hand smoke exposure history.    She comes in today 10/06/19 feeling about the same as she has for the past couple of visits.  That said, she was on the campus of [facility] on 7/15 while her husband was getting a colonoscopy and was able to get around reasonably well.  Her weight is up 2 pounds today when compared to her visit 1 week ago with a trend of 168 to 170 pounds.  She has not noted a significant change in her symptoms with addition of furosemide.  She continues to note generalized fatigue, dyspnea, and chest pressure all of which are exertional.  When asked which of the above  is bothering her the most, her main complaint right now is exertional dyspnea.  Symptoms feel similar to her presentation leading up to her bypass surgery.  She does try and watch her salt intake though does consume some salty foods.  Hospital Course     Consultants: none  CAD s/p CABG x 2 with accelerating angina Given her disease and symptoms, she was scheduled for right and left heart cath. She presented yesterday for angiography which revealed significant underlying 3v disease with patent LIMA-LAD, SVG-D2. RCA with 85% stenosis treated with overlapping DES x 2. She tolerated the procedure well. She was started on ASA and plavix. Coreg was added due to SBP in the 200s during heart cath. She was observed and hydrated overnight. BP is improved today.    Diastolic dysfunction Pulmonary hypertension She appeared euvolemic today.   Hypertension SBP in the 200s during heart cath. Dr. Kirke CorinArida started coreg. Pt taking 5 mg norvasc BID at home. She has not received this yet. Will give 5 mg amlodipine prior to discharge (scheduled for tonight) and monitor BP. Further titration of medications in the OP setting.    Hyperlipidemia LDL 78 (04/2019) Has been on 40 mg lipitor. I have added on a repeat lipid panel this morning. Will increase lipitor to 80 mg.  Pt seen and examined by Dr. Allyson Sabal and deemed stable for discharge.    Did the patient have an acute coronary syndrome (MI, NSTEMI, STEMI, etc) this admission?:  No                               Did the patient have a percutaneous coronary intervention (stent / angioplasty)?:  Yes.     Cath/PCI Registry Performance & Quality Measures: Aspirin prescribed? - Yes ADP Receptor Inhibitor (Plavix/Clopidogrel, Brilinta/Ticagrelor or Effient/Prasugrel) prescribed (includes medically managed patients)? - Yes High Intensity Statin (Lipitor 40-80mg  or Crestor 20-40mg ) prescribed? - Yes For EF <40%, was ACEI/ARB prescribed? - Not Applicable (EF >/= 40%)  For EF <40%, Aldosterone Antagonist (Spironolactone or Eplerenone) prescribed? - Not Applicable (EF >/= 40%) Cardiac Rehab Phase II ordered? - Yes   _____________  Discharge Vitals Blood pressure (!) 159/55, pulse 52, temperature 98 F (36.7 C), temperature source Oral, resp. rate 20, height 5' (1.524 m), weight 75 kg, SpO2 97 %.  Filed Weights   10/18/19 0721 10/19/19 0334  Weight: 77.1 kg 75 kg    Labs & Radiologic Studies    CBC Recent Labs    10/18/19 1344 10/19/19 0416  WBC  --  7.8  HGB 12.6  12.6 10.4*  HCT 37.0  37.0 31.5*  MCV  --  90.3  PLT  --  246   Basic Metabolic Panel Recent Labs    16/10/96 1344 10/19/19 0416  NA 142  142 137  K 3.9  3.9 3.9  CL  --  104  CO2  --  24  GLUCOSE  --  104*  BUN  --  18  CREATININE  --  1.07*  CALCIUM  --  9.0   Liver Function Tests No results for input(s): AST, ALT, ALKPHOS, BILITOT, PROT, ALBUMIN in the last 72 hours. No results for input(s): LIPASE, AMYLASE in the last 72 hours. High Sensitivity Troponin:   No results for input(s): TROPONINIHS in the last 720 hours.  BNP Invalid input(s): POCBNP D-Dimer No results for input(s): DDIMER in the last 72 hours. Hemoglobin A1C No results for input(s): HGBA1C in the last 72 hours. Fasting Lipid Panel Recent Labs    10/19/19 0416  CHOL 131  HDL 37*  LDLCALC 70  TRIG 045  CHOLHDL 3.5   Thyroid Function Tests No results for input(s): TSH, T4TOTAL, T3FREE, THYROIDAB in the last 72 hours.  Invalid input(s): FREET3 _____________  CARDIAC CATHETERIZATION  Result Date: 10/18/2019  The left ventricular systolic function is normal.  LV end diastolic pressure is normal.  The left ventricular ejection fraction is 55-65% by visual estimate.  Mid RCA lesion is 85% stenosed.  Post intervention, there is a 0% residual stenosis.  SVG graft was visualized by angiography and is normal in caliber and anatomically normal.  The graft exhibits no disease.  Mid LAD-1  lesion is 85% stenosed.  Mid LAD-2 lesion is 80% stenosed.  Mid Cx to Dist Cx lesion is 60% stenosed.  Prox LAD to Mid LAD lesion is 100% stenosed.  LIMA and is small.  Prox Graft to Dist Graft lesion is 50% stenosed.  A drug-eluting stent was successfully placed using a STENT RESOLUTE ONYX 3.0X18.  1.  Significant underlying three-vessel coronary artery disease with patent LIMA to LAD and SVG to second diagonal.  The SVG to second diagonal provides competitive flow to the LAD.  The LIMA itself is of  a small caliber with moderate diffuse disease in the mid and distal segment.  There is moderate stenosis in the mid to distal left circumflex which was not well visualized given that the left main could not be selectively engaged.  There was significant mid RCA stenosis in a large vessel.  The RCA was not bypassed. 2.  Normal LV systolic function. 3.  Right heart catheterization showed normal right and left-sided filling pressures, mild pulmonary hypertension normal cardiac output.  Severe systemic hypertension with systolic blood pressure around 200. 4.  Successful drug-eluting stent placement to the mid right coronary artery.  A second overlapping stent was placed proximally due to proximal edge dissection. Recommendations: Dual antiplatelet therapy for at least 1 year. Aggressive treatment of risk factors. Blood pressure control.  I elected to add carvedilol. Hydrate overnight given underlying chronic kidney disease.  150 mL of contrast was used.   Disposition   Pt is being discharged home today in good condition.  Follow-up Plans & Appointments     Follow-up Information     Alver Sorrow, NP Follow up on 10/27/2019.   Specialty: Cardiology Why: 3:00 TOC Contact information: [address] [place] [address] [phone]                Discharge Instructions     Amb Referral to Cardiac Rehabilitation   Complete by: As directed    Diagnosis:  Coronary Stents  PTCA     After initial evaluation and assessments completed: Virtual Based Care may be provided alone or in conjunction with Phase 2 Cardiac Rehab based on patient barriers.: Yes   Diet - low sodium heart healthy   Complete by: As directed    Discharge instructions   Complete by: As directed    No driving for 2 days. No lifting over 5 lbs for 1 week. No sexual activity for 1 week. Keep procedure site clean & dry. If you notice increased pain, swelling, bleeding or pus, call/return!  You may shower, but no soaking baths/hot tubs/pools for 1 week.   Increase activity slowly   Complete by: As directed        Discharge Medications   Allergies as of 10/19/2019       Reactions   Simethicone Anaphylaxis, Hives   Morphine And Related Nausea And Vomiting   Severe n & v. Any other pain medications are okay   Penicillins Hives   Has patient had a PCN reaction causing immediate rash, facial/tongue/throat swelling, SOB or lightheadedness with hypotension: No Has patient had a PCN reaction causing severe rash involving mucus membranes or skin necrosis: No Has patient had a PCN reaction that required hospitalization No Has patient had a PCN reaction occurring within the last 10 years: No If all of the above answers are "NO", then may proceed with Cephalosporin use.        Medication List     TAKE these medications    amLODipine 5 MG tablet Commonly known as: NORVASC Take 1 tablet (5 mg total) by mouth 2 (two) times daily. What changed: when to take this   APPLE CIDER VINEGAR DIET PO Take 1 each by mouth in the morning and at bedtime. Golli Apple Cider   aspirin EC 81 MG tablet Take 1 tablet (81 mg total) by mouth daily.   atorvastatin 80 MG tablet Commonly known as: LIPITOR Take 1 tablet (80 mg total) by mouth at bedtime. What changed:  medication strength how much to take  carvedilol 6.25 MG tablet Commonly known as: COREG Take 1 tablet (6.25 mg total) by mouth 2 (two)  times daily with a meal.   clopidogrel 75 MG tablet Commonly known as: PLAVIX Take 1 tablet (75 mg total) by mouth daily with breakfast.   Fifty50 Pen Needles 31G X 5 MM Misc Generic drug: Insulin Pen Needle Use as directed   fluticasone 50 MCG/ACT nasal spray Commonly known as: FLONASE Place 1-2 sprays into both nostrils daily as needed for allergies.   gabapentin 300 MG capsule Commonly known as: NEURONTIN Take 600 mg by mouth at bedtime.   glimepiride 2 MG tablet Commonly known as: AMARYL Take 1 tablet (2 mg total) by mouth 2 (two) times daily. What changed: when to take this   insulin detemir 100 UNIT/ML injection Commonly known as: LEVEMIR Inject 27 Units into the skin daily.   levothyroxine 75 MCG tablet Commonly known as: SYNTHROID Take 75 mcg by mouth daily before breakfast.   metFORMIN 1000 MG tablet Commonly known as: GLUCOPHAGE Take 1 tablet (1,000 mg total) by mouth 2 (two) times daily with a meal. Resume on 10/21/19. What changed: additional instructions   nitroGLYCERIN 0.4 MG SL tablet Commonly known as: Nitrostat Place 1 tablet (0.4 mg total) under the tongue every 5 (five) minutes as needed for chest pain.   ondansetron 4 MG disintegrating tablet Commonly known as: ZOFRAN-ODT Take 4 mg by mouth every 8 (eight) hours as needed for nausea or vomiting.   OneTouch Ultra test strip Generic drug: glucose blood USE 2 (TWO) TIMES DAILY. USE AS INSTRUCTED.   pantoprazole 40 MG tablet Commonly known as: PROTONIX Take 40 mg by mouth daily.   sertraline 100 MG tablet Commonly known as: ZOLOFT Take 100 mg by mouth daily.   Systane 0.4-0.3 % Soln Generic drug: Polyethyl Glycol-Propyl Glycol Place 1 drop into both eyes 3 (three) times daily as needed (for dry eyes).   tiZANidine 2 MG tablet Commonly known as: ZANAFLEX Take 2 mg by mouth every 6 (six) hours as needed for muscle spasms.   VITAMIN D PO Take 1 tablet by mouth daily.            Outstanding Labs/Studies   BP  Duration of Discharge Encounter   Greater than 30 minutes including physician time.  Signed, Roe Rutherford [facility], PA 10/19/2019, 1:56 PM   Agree with note by Micah Flesher PA-C  Pt admitted by Dr Kirke Corin for same day cath. S/P native RCA PCI/DES. Left radial. Excellent result. HTN meds adjusted. Exam benign. Stable for DC home. F/U as OP with Dr Kirke Corin  Runell Gess, M.D., FACP, [facility], [facility] [address] [place], [place]  [address]  [phone] 10/20/2019 6:46 AM

## 2019-10-27 ENCOUNTER — Other Ambulatory Visit: Payer: Self-pay

## 2019-10-27 ENCOUNTER — Encounter: Payer: Self-pay | Admitting: Family

## 2019-10-27 ENCOUNTER — Ambulatory Visit (INDEPENDENT_AMBULATORY_CARE_PROVIDER_SITE_OTHER): Payer: Medicare Other | Admitting: Family

## 2019-10-27 VITALS — BP 150/82 | HR 54 | Ht 60.0 in | Wt 170.1 lb

## 2019-10-27 DIAGNOSIS — E785 Hyperlipidemia, unspecified: Secondary | ICD-10-CM | POA: Diagnosis not present

## 2019-10-27 DIAGNOSIS — I5189 Other ill-defined heart diseases: Secondary | ICD-10-CM

## 2019-10-27 DIAGNOSIS — I25118 Atherosclerotic heart disease of native coronary artery with other forms of angina pectoris: Secondary | ICD-10-CM

## 2019-10-27 DIAGNOSIS — I272 Pulmonary hypertension, unspecified: Secondary | ICD-10-CM | POA: Diagnosis not present

## 2019-10-27 DIAGNOSIS — I10 Essential (primary) hypertension: Secondary | ICD-10-CM | POA: Diagnosis not present

## 2019-10-27 MED ORDER — ENALAPRIL MALEATE 2.5 MG PO TABS
2.5000 mg | ORAL_TABLET | Freq: Two times a day (BID) | ORAL | 3 refills | Status: DC
Start: 2019-10-27 — End: 2020-10-04

## 2019-10-27 MED ORDER — ATORVASTATIN CALCIUM 40 MG PO TABS: 40.0000 mg | ORAL_TABLET | Freq: Every day | ORAL | 6 refills | Status: DC

## 2019-10-27 NOTE — Patient Instructions (Addendum)
Medication Instructions:  Your physician has recommended you make the following change in your medication:   RESUME Enalapril 2.5mg  twice daily  DECREASE Atorvastatin to 1 tablet (40mg  total)  *If you need a refill on your cardiac medications before your next appointment, please call your pharmacy*   Lab Work: Your physician recommends that you return for lab work on 8/13 or 8/16 for BMP to the 9/16. You do not need to be fasting.   If you have labs (blood work) drawn today and your tests are completely normal, you will receive your results only by: [facility] MyChart Message (if you have MyChart) OR . A paper copy in the mail If you have any lab test that is abnormal or we need to change your treatment, we will call you to review the results.  Testing/Procedures: Your EKG today was stable compared to previous.   Follow-Up: At [facility], you and your health needs are our priority.  As part of our continuing mission to provide you with exceptional heart care, we have created designated Provider Care Teams.  These Care Teams include your primary Cardiologist (physician) and Advanced Practice Providers (APPs -  Physician Assistants and Nurse Practitioners) who all work together to provide you with the care you need, when you need it.  We recommend signing up for the patient portal called "MyChart".  Sign up information is provided on this After Visit Summary.  MyChart is used to connect with patients for Virtual Visits (Telemedicine).  Patients are able to view lab/test results, encounter notes, upcoming appointments, etc.  Non-urgent messages can be sent to your provider as well.   To learn more about what you can do with MyChart, go to [facility].    Your next appointment:   6 week(s)  The format for your next appointment:   In Person  Provider:     You may see [email or web address], MD or Lorine Bears, NP    Other Instructions   You have been referred to cardiac  rehab. This is a combination program including monitored exercise, dietary education, and support group. We strongly recommend participating in the program. Expect a phone call from them in approximately 1-2 weeks. If you do not hear from them, the phone number for cardiac rehab at [facility] is [phone].

## 2019-10-27 NOTE — Progress Notes (Signed)
Office Visit    Patient Name: Alexandria Marquez Date of Encounter: 10/27/2019  Primary Care Provider:  Patrice ParadiseMcLaughlin, Miriam K, MD Primary Cardiologist:  Lorine BearsMuhammad Arida, MD Electrophysiologist:  None   Chief Complaint    Alexandria Marquez is a 74 y.o. female with a hx of CAD s/p CABG X to at [facility] in 2008 s/p subsequent PCI/DES to RCA 09/2019, diastolic dysfunction, mild pulmonary hypertension, DM2, PAD s/p SMA stenting for suspected chronic mesenteric ischemia 05/2018, HTN, HLD, hypothyroidism, OSA, depression, anxiety, GERD presents today for follow up after cardiac cath.   Past Medical History    Past Medical History:  Diagnosis Date  . Anxiety   . Arthritis   . Asthma    when young  . Coronary artery disease   . Depression   . Diabetes mellitus without complication (HCC)   . Environmental and seasonal allergies   . GERD (gastroesophageal reflux disease)   . History of hiatal hernia   . Hyperlipidemia   . Hypertension   . Hypothyroid   . Hypothyroidism   . Lower extremity edema   . Motion sickness   . OSA (obstructive sleep apnea) 06/04/2014  . Osteoarthritis   . Scoliosis   . Shortness of breath dyspnea    doe   Past Surgical History:  Procedure Laterality Date  . ABDOMINAL HYSTERECTOMY    . APPENDECTOMY    . BACK SURGERY  2014   lumbar fusion/plate/rods/screws  . CARDIAC CATHETERIZATION    . CHOLECYSTECTOMY  2005  . COLONOSCOPY WITH PROPOFOL N/A 01/18/2017   Procedure: COLONOSCOPY WITH PROPOFOL;  Surgeon: Christena DeemSkulskie, Martin U, MD;  Location: [facility] ENDOSCOPY;  Service: Endoscopy;  Laterality: N/A;  . CORONARY ARTERY BYPASS GRAFT     2008 2 vessels  . CORONARY ARTERY BYPASS GRAFT  2008  . CORONARY STENT INTERVENTION  10/18/2019  . CORONARY STENT INTERVENTION N/A 10/18/2019   Procedure: CORONARY STENT INTERVENTION;  Surgeon: Iran OuchArida, Muhammad A, MD;  Location: [facility] INVASIVE CV LAB;  Service: Cardiovascular;  Laterality: N/A;  . ESOPHAGOGASTRODUODENOSCOPY (EGD) WITH PROPOFOL N/A  05/16/2018   Procedure: ESOPHAGOGASTRODUODENOSCOPY (EGD) WITH PROPOFOL;  Surgeon: Christena DeemSkulskie, Martin U, MD;  Location: [facility] END[facility]COPY;  Service: Endoscopy;  Laterality: N/A;  . FRACTURE SURGERY    . HERNIA REPAIR  1987   incisional  . JOINT REPLACEMENT Right 2017   knee  . ORIF ANKLE FRACTURE Left 06/19/2016   Procedure: OPEN REDUCTION INTERNAL FIXATION (ORIF) ANKLE FRACTURE;  Surgeon: Juanell FairlyKevin Krasinski, MD;  Location: [facility] ORS;  Service: Orthopedics;  Laterality: Left;  . rcr    . RIGHT/LEFT HEART CATH AND CORONARY/GRAFT ANGIOGRAPHY N/A 10/18/2019   Procedure: RIGHT/LEFT HEART CATH AND CORONARY/GRAFT ANGIOGRAPHY;  Surgeon: Iran OuchArida, Muhammad A, MD;  Location: [facility] INVASIVE CV LAB;  Service: Cardiovascular;  Laterality: N/A;  . ROTATOR CUFF REPAIR Left 2013  . TONSILLECTOMY    . TOTAL KNEE ARTHROPLASTY Right 12/19/2015   Procedure: TOTAL KNEE ARTHROPLASTY;  Surgeon: Christena FlakeJohn J Poggi, MD;  Location: [facility] ORS;  Service: Orthopedics;  Laterality: Right;  . TUBAL LIGATION    . VISCERAL ANGIOGRAPHY N/A 05/23/2018   Procedure: VISCERAL ANGIOGRAPHY;  Surgeon: Annice Needyew, Jason S, MD;  Location: [facility] INVASIVE CV LAB;  Service: Cardiovascular;  Laterality: N/A;    Allergies  Allergies  Allergen Reactions  . Simethicone Anaphylaxis and Hives  . Morphine And Related Nausea And Vomiting    Severe n & v. Any other pain medications are okay  . Penicillins Hives    Has patient  had a PCN reaction causing immediate rash, facial/tongue/throat swelling, SOB or lightheadedness with hypotension: No Has patient had a PCN reaction causing severe rash involving mucus membranes or skin necrosis: No Has patient had a PCN reaction that required hospitalization No Has patient had a PCN reaction occurring within the last 10 years: No If all of the above answers are "NO", then may proceed with Cephalosporin use.     History of Present Illness    Alexandria Marquez is a 74 y.o. female with a hx of CAD s/p CABG X to at [facility] in 2008 s/p  subsequent PCI/DES to RCA 09/2019, diastolic dysfunction, mild pulmonary hypertension, DM2, PAD s/p SMA stenting for suspected chronic mesenteric ischemia 05/2018, HTN, HLD, hypothyroidism, OSA, depression, anxiety, GERD last seen for cardiac catheterization 10/18/2019.  She was seen 07/2019 with progressive exertional dyspnea over preceding year that was occurring with minimal exertion.  Also noted with substernal chest tightness and heaviness.  Lexiscan MPI 08/25/2019 no significant ischemia with hyperdynamic LV SF with EF greater than 65% and overall low risk today.  Echo 08/28/2019 with EF 60-65%, no regional wall motion normalities, mild LVH, grade 1 diastolic dysfunction, normal RV SF and RV cavity size, mildly elevated PASP at 43.9 mmHg, mild MR, mild to moderate TR, mild to moderate AV sclerosis without stenosis.  Seen in clinic 09/29/2019 with continued dyspnea and was started on Lasix 20 mg daily and her hydrochlorothiazide was stopped.  Did note significant secondhand smoke exposure history.  When seen in clinic 10/06/2019 reported continued dyspnea, fatigue, chest pressure.  She was recommended for cardiac catheterization which was performed 10/18/2019. Cath showed significant underlying 3-vessel CAD with patent LIMA-LAD, SVG-diagonal. SVG-second diagonal proviced competitive flow to LAD. LIMA small caliber with moderate diffuse disease in mid and distal segment. Mod stenosis in mid-distal LCx. Significant mid RCA stenosis which was treated with DESx2. RHC with normal filling pressures bilaterally, mild PAH. She was started on Coreg due to severe hypertension. Of note, her Lasix, Enalapril were stopped previously due to declining renal function. Labs on 10/19/19 showed her creatinine had improved to 1.07 and GFR to 51.   Reports feeling overall well since discharge. Cath site is healing well with some mild bruising. Her BP at home is averaging 160s over 80s when checked a few hours after her medications. Does  report significant diarrhea which is likely from her recently increased Atorvastatin dose.   Reports no shortness of breath. Reports some improvement in dyspnea on exertion. We discussed cardiac rehab and gradually increasing her physcial activity. Reports no chest pain, pressure, or tightness. No edema, orthopnea, PND. Reports no palpitations.   EKGs/Labs/Other Studies Reviewed:   The following studies were reviewed today:  [facility] 10/18/19 1.  Significant underlying three-vessel coronary artery disease with patent LIMA to LAD and SVG to second diagonal.  The SVG to second diagonal provides competitive flow to the LAD.  The LIMA itself is of a small caliber with moderate diffuse disease in the mid and distal segment.  There is moderate stenosis in the mid to distal left circumflex which was not well visualized given that the left main could not be selectively engaged.  There was significant mid RCA stenosis in a large vessel.  The RCA was not bypassed. 2.  Normal LV systolic function. 3.  Right heart catheterization showed normal right and left-sided filling pressures, mild pulmonary hypertension normal cardiac output.  Severe systemic hypertension with systolic blood pressure around 200. 4.  Successful drug-eluting stent  placement to the mid right coronary artery.  A second overlapping stent was placed proximally due to proximal edge dissection.   Recommendations: Dual antiplatelet therapy for at least 1 year. Aggressive treatment of risk factors. Blood pressure control.  I elected to add carvedilol. Hydrate overnight given underlying chronic kidney disease.  150 mL of contrast was used.  EKG:  EKG is ordered today.  The ekg ordered today demonstrates SB 54 bpm with incomplete RBBB and no acute ST/T wave changes.   Recent Labs: 10/19/2019: BUN 18; Creatinine, Ser 1.07; Hemoglobin 10.4; Platelets 246; Potassium 3.9; Sodium 137  Recent Lipid Panel    Component Value Date/Time   CHOL 131  10/19/2019 0416   TRIG 120 10/19/2019 0416   HDL 37 (L) 10/19/2019 0416   CHOLHDL 3.5 10/19/2019 0416   VLDL 24 10/19/2019 0416   LDLCALC 70 10/19/2019 0416    Home Medications   Current Meds  Medication Sig  . amLODipine (NORVASC) 5 MG tablet Take 1 tablet (5 mg total) by mouth 2 (two) times daily.  Marland Kitchen aspirin 81 MG tablet Take 1 tablet (81 mg total) by mouth daily.  Marland Kitchen atorvastatin (LIPITOR) 80 MG tablet Take 1 tablet (80 mg total) by mouth at bedtime.  . carvedilol (COREG) 6.25 MG tablet Take 1 tablet (6.25 mg total) by mouth 2 (two) times daily with a meal.  . clopidogrel (PLAVIX) 75 MG tablet Take 1 tablet (75 mg total) by mouth daily with breakfast.  . fluticasone (FLONASE) 50 MCG/ACT nasal spray Place 1-2 sprays into both nostrils daily as needed for allergies.   Marland Kitchen gabapentin (NEURONTIN) 300 MG capsule Take 600 mg by mouth at bedtime.  Marland Kitchen glimepiride (AMARYL) 2 MG tablet Take 2 mg by mouth daily with breakfast.  . glucose blood (ONETOUCH ULTRA) test strip USE 2 (TWO) TIMES DAILY. USE AS INSTRUCTED.  Marland Kitchen insulin detemir (LEVEMIR) 100 UNIT/ML injection Inject 27 Units into the skin daily.   . Insulin Pen Needle (FIFTY50 PEN NEEDLES) 31G X 5 MM MISC Use as directed  . levothyroxine (SYNTHROID, LEVOTHROID) 75 MCG tablet Take 75 mcg by mouth daily before breakfast.  . metFORMIN (GLUCOPHAGE) 1000 MG tablet Take 1 tablet (1,000 mg total) by mouth 2 (two) times daily with a meal. Resume on 10/21/19.  . Misc Natural Products (APPLE CIDER VINEGAR DIET PO) Take 1 each by mouth in the morning and at bedtime. Golli Apple Cider  . nitroGLYCERIN (NITROSTAT) 0.4 MG SL tablet Place 1 tablet (0.4 mg total) under the tongue every 5 (five) minutes as needed for chest pain.  Marland Kitchen ondansetron (ZOFRAN-ODT) 4 MG disintegrating tablet Take 4 mg by mouth every 8 (eight) hours as needed for nausea or vomiting.  . pantoprazole (PROTONIX) 40 MG tablet Take 40 mg by mouth daily.   Bertram Gala Glycol-Propyl Glycol  (SYSTANE) 0.4-0.3 % SOLN Place 1 drop into both eyes 3 (three) times daily as needed (for dry eyes).  . sertraline (ZOLOFT) 100 MG tablet Take 100 mg by mouth daily.  Marland Kitchen tiZANidine (ZANAFLEX) 2 MG tablet Take 2 mg by mouth every 6 (six) hours as needed for muscle spasms.  Marland Kitchen VITAMIN D PO Take 1 tablet by mouth daily.       Review of Systems  All other systems reviewed and are otherwise negative except as noted above.  Physical Exam    VS:  BP (!) 150/82 (BP Location: Right Arm, Patient Position: Sitting, Cuff Size: Large)   Pulse (!) 54   Ht 5' (  1.524 m)   Wt 170 lb 2 oz (77.2 kg)   SpO2 98%   BMI 33.23 kg/m  , BMI Body mass index is 33.23 kg/m. GEN: Well nourished, well developed, in no acute distress. HEENT: normal. Neck: Supple, no JVD, carotid bruits, or masses. Cardiac: RRR, no murmurs, rubs, or gallops. No clubbing, cyanosis, edema.  Radials/DP/PT 2+ and equal bilaterally.  Respiratory:  Respirations regular and unlabored, clear to auscultation bilaterally. GI: Soft, nontender, nondistended, BS + x 4. MS: No deformity or atrophy. Skin: Warm and dry, no rash.  Scattered ecchymosis from various IVs and labs during hospital.  Left radial cath site clean, dry, intact with no signs of infection and mildly old ecchymosis without hematoma. Neuro:  Strength and sensation are intact. Psych: Normal affect.  Assessment & Plan    1. CAD s/p CABG 2008 and s/p DESx2 to RCA 09/2019 - Stable with no anginal symptoms. L radial cath site healing appropriately with mild ecchymosis. GDMT includes DAPT aspirin/plavix for one year, Coreg, statin. Encouraged to participate in cardiac rehab, referral previously placed.   2. HLD, LDL goal <70 - Reports diarrhea since Atorvastatin increased to 80mg  daily - reduce to 40mg  daily. Most recent LDL 10/19/19 of 70 - plan to repeat lipid panel at follow up. If LDL not <70, consider addition of Zetia.   3. HTN - BP remains 160s/80s. Continue Amlodipine 5mg  BID,  Coreg 6.25mg  BID. She was previously on enalapril 2.5mg  twice daily but was held in setting of worsening renal function - as her renal function has improved and her BP is elevated we will resume enalapril 2.5mg  twice daily.  4. Mild pulmonary hypertension - By cath 09/2019. Defer re-initiation of Lasix at this time as she is well compensated. Anticipate much of her dyspnea was due to angina, as above.    5. DM2 - Continue to follow with PCP. If continued difficulties with creatinine/GFR may need to consider alternate agent.   6. Diastolic dysfunction - Euvolemic and well compensated. No indication for loop diuretic at this time. Continue Coreg.  7. PAD - Continue aspirin, statin. Continue to follow with vascular. No signs of worsening claudication.  8. OSA - CPAP compliance encouraged.  Disposition: Follow up in 6 week(s) with Dr. 10/21/19 or APP with lipid panel at that time.  , NP 10/27/2019, 3:37 PM

## 2019-11-03 ENCOUNTER — Encounter: Payer: Medicare Other | Attending: Cardiovascular Disease | Admitting: *Deleted

## 2019-11-03 ENCOUNTER — Other Ambulatory Visit: Payer: Self-pay

## 2019-11-03 DIAGNOSIS — F329 Major depressive disorder, single episode, unspecified: Secondary | ICD-10-CM | POA: Insufficient documentation

## 2019-11-03 DIAGNOSIS — E785 Hyperlipidemia, unspecified: Secondary | ICD-10-CM | POA: Insufficient documentation

## 2019-11-03 DIAGNOSIS — E119 Type 2 diabetes mellitus without complications: Secondary | ICD-10-CM | POA: Insufficient documentation

## 2019-11-03 DIAGNOSIS — F419 Anxiety disorder, unspecified: Secondary | ICD-10-CM | POA: Insufficient documentation

## 2019-11-03 DIAGNOSIS — Z7982 Long term (current) use of aspirin: Secondary | ICD-10-CM | POA: Insufficient documentation

## 2019-11-03 DIAGNOSIS — Z79899 Other long term (current) drug therapy: Secondary | ICD-10-CM | POA: Insufficient documentation

## 2019-11-03 DIAGNOSIS — Z955 Presence of coronary angioplasty implant and graft: Secondary | ICD-10-CM | POA: Insufficient documentation

## 2019-11-03 DIAGNOSIS — Z7902 Long term (current) use of antithrombotics/antiplatelets: Secondary | ICD-10-CM | POA: Insufficient documentation

## 2019-11-03 DIAGNOSIS — E039 Hypothyroidism, unspecified: Secondary | ICD-10-CM | POA: Insufficient documentation

## 2019-11-03 DIAGNOSIS — I2510 Atherosclerotic heart disease of native coronary artery without angina pectoris: Secondary | ICD-10-CM | POA: Insufficient documentation

## 2019-11-03 DIAGNOSIS — K219 Gastro-esophageal reflux disease without esophagitis: Secondary | ICD-10-CM | POA: Insufficient documentation

## 2019-11-03 DIAGNOSIS — I10 Essential (primary) hypertension: Secondary | ICD-10-CM | POA: Insufficient documentation

## 2019-11-03 DIAGNOSIS — Z794 Long term (current) use of insulin: Secondary | ICD-10-CM | POA: Insufficient documentation

## 2019-11-03 DIAGNOSIS — Z7901 Long term (current) use of anticoagulants: Secondary | ICD-10-CM | POA: Insufficient documentation

## 2019-11-03 NOTE — Progress Notes (Signed)
Initial telephone encounter completed. Diagnosis can be found in [facility] 7/28. EP orientation scheduled for 8/16 at 2:30pm

## 2019-11-06 ENCOUNTER — Other Ambulatory Visit
Admission: RE | Admit: 2019-11-06 | Discharge: 2019-11-06 | Disposition: A | Discharge status: Home / Self Care | Payer: Medicare Other | Attending: Family | Admitting: Family

## 2019-11-06 ENCOUNTER — Encounter: Payer: Self-pay | Admitting: Family

## 2019-11-06 ENCOUNTER — Other Ambulatory Visit: Payer: Self-pay

## 2019-11-06 VITALS — Ht 61.0 in | Wt 170.0 lb

## 2019-11-06 DIAGNOSIS — Z794 Long term (current) use of insulin: Secondary | ICD-10-CM | POA: Diagnosis not present

## 2019-11-06 DIAGNOSIS — F329 Major depressive disorder, single episode, unspecified: Secondary | ICD-10-CM | POA: Diagnosis not present

## 2019-11-06 DIAGNOSIS — E785 Hyperlipidemia, unspecified: Secondary | ICD-10-CM | POA: Diagnosis not present

## 2019-11-06 DIAGNOSIS — Z7901 Long term (current) use of anticoagulants: Secondary | ICD-10-CM | POA: Diagnosis not present

## 2019-11-06 DIAGNOSIS — Z7902 Long term (current) use of antithrombotics/antiplatelets: Secondary | ICD-10-CM | POA: Diagnosis not present

## 2019-11-06 DIAGNOSIS — E119 Type 2 diabetes mellitus without complications: Secondary | ICD-10-CM | POA: Diagnosis not present

## 2019-11-06 DIAGNOSIS — I10 Essential (primary) hypertension: Secondary | ICD-10-CM | POA: Diagnosis not present

## 2019-11-06 DIAGNOSIS — I2510 Atherosclerotic heart disease of native coronary artery without angina pectoris: Secondary | ICD-10-CM | POA: Diagnosis not present

## 2019-11-06 DIAGNOSIS — Z955 Presence of coronary angioplasty implant and graft: Secondary | ICD-10-CM

## 2019-11-06 DIAGNOSIS — E039 Hypothyroidism, unspecified: Secondary | ICD-10-CM | POA: Diagnosis not present

## 2019-11-06 DIAGNOSIS — I25118 Atherosclerotic heart disease of native coronary artery with other forms of angina pectoris: Secondary | ICD-10-CM | POA: Diagnosis present

## 2019-11-06 DIAGNOSIS — Z79899 Other long term (current) drug therapy: Secondary | ICD-10-CM | POA: Diagnosis not present

## 2019-11-06 DIAGNOSIS — Z7982 Long term (current) use of aspirin: Secondary | ICD-10-CM | POA: Diagnosis not present

## 2019-11-06 DIAGNOSIS — K219 Gastro-esophageal reflux disease without esophagitis: Secondary | ICD-10-CM | POA: Diagnosis not present

## 2019-11-06 DIAGNOSIS — F419 Anxiety disorder, unspecified: Secondary | ICD-10-CM | POA: Diagnosis not present

## 2019-11-06 LAB — BASIC METABOLIC PANEL
Anion gap: 12 (ref 5–15)
BUN: 23 mg/dL (ref 8–23)
CO2: 25 mmol/L (ref 22–32)
Calcium: 9.4 mg/dL (ref 8.9–10.3)
Chloride: 99 mmol/L (ref 98–111)
Creatinine, Ser: 1.17 mg/dL — ABNORMAL HIGH (ref 0.44–1.00)
GFR calc Af Amer: 53 mL/min — ABNORMAL LOW (ref >60)
GFR calc non Af Amer: 46 mL/min — ABNORMAL LOW (ref >60)
Glucose, Bld: 72 mg/dL (ref 70–99)
Potassium: 4.6 mmol/L (ref 3.5–5.1)
Sodium: 136 mmol/L (ref 135–145)

## 2019-11-06 NOTE — Patient Instructions (Signed)
Patient Instructions  Patient Details  Name: Alexandria Marquez MRN: [id number] Date of Birth: February 26, 1946 Referring Provider:  Iran Ouch, MD  Below are your personal goals for exercise, nutrition, and risk factors. Our goal is to help you stay on track towards obtaining and maintaining these goals. We will be discussing your progress on these goals with you throughout the program.  Initial Exercise Prescription:  Initial Exercise Prescription - 11/06/19 1500      Date of Initial Exercise RX and Referring Provider   Date 11/06/19    Referring Provider Arida      Treadmill   MPH 1.5    Grade 0    Minutes 15      Recumbant Bike   Level 1    RPM 60    Watts 10    Minutes 15    METs 1.5      NuStep   Level 1    SPM 80    Minutes 15    METs 1.5      REL-XR   Level 1    Speed 50    Minutes 15    METs 1.5      Prescription Details   Frequency (times per week) 3    Duration Progress to 30 minutes of continuous aerobic without signs/symptoms of physical distress      Intensity   THRR 40-80% of Max Heartrate 88-127    Ratings of Perceived Exertion 11-13    Perceived Dyspnea 0-4      Resistance Training   Training Prescription Yes    Weight 3 lb    Reps 10-15           Exercise Goals: Frequency: Be able to perform aerobic exercise two to three times per week in program working toward 2-5 days per week of home exercise.  Intensity: Work with a perceived exertion of 11 (fairly light) - 15 (hard) while following your exercise prescription.  We will make changes to your prescription with you as you progress through the program.   Duration: Be able to do 30 to 45 minutes of continuous aerobic exercise in addition to a 5 minute warm-up and a 5 minute cool-down routine.   Nutrition Goals: Your personal nutrition goals will be established when you do your nutrition analysis with the dietician.  The following are general nutrition guidelines to follow: Cholesterol <  200mg /day Sodium < 1500mg /day Fiber: Women over 50 yrs - 21 grams per day  Personal Goals:  Personal Goals and Risk Factors at Admission - 11/06/19 1546      Core Components/Risk Factors/Patient Goals on Admission    Weight Management Weight Maintenance;Yes    Intervention Weight Management: Develop a combined nutrition and exercise program designed to reach desired caloric intake, while maintaining appropriate intake of nutrient and fiber, sodium and fats, and appropriate energy expenditure required for the weight goal.;Weight Management: Provide education and appropriate resources to help participant work on and attain dietary goals.    Admit Weight 170 lb (77.1 kg)    Goal Weight: Short Term 170 lb (77.1 kg)    Goal Weight: Long Term 170 lb (77.1 kg)    Expected Outcomes Short Term: Continue to assess and modify interventions until short term weight is achieved;Long Term: Adherence to nutrition and physical activity/exercise program aimed toward attainment of established weight goal;Weight Maintenance: Understanding of the daily nutrition guidelines, which includes 25-35% calories from fat, 7% or less cal from saturated fats, less than 200mg  cholesterol,  less than 1.5gm of sodium, & 5 or more servings of fruits and vegetables daily    Intervention Provide education about signs/symptoms and action to take for hypo/hyperglycemia.;Provide education about proper nutrition, including hydration, and aerobic/resistive exercise prescription along with prescribed medications to achieve blood glucose in normal ranges: Fasting glucose 65-99 mg/dL    Expected Outcomes Long Term: Attainment of HbA1C < 7%.;Short Term: Participant verbalizes understanding of the signs/symptoms and immediate care of hyper/hypoglycemia, proper foot care and importance of medication, aerobic/resistive exercise and nutrition plan for blood glucose control.    Hypertension Yes    Intervention Provide education on lifestyle  modifcations including regular physical activity/exercise, weight management, moderate sodium restriction and increased consumption of fresh fruit, vegetables, and low fat dairy, alcohol moderation, and smoking cessation.;Monitor prescription use compliance.    Expected Outcomes Short Term: Continued assessment and intervention until BP is < 140/19mm HG in hypertensive participants. < 130/14mm HG in hypertensive participants with diabetes, heart failure or chronic kidney disease.;Long Term: Maintenance of blood pressure at goal levels.    Lipids Yes    Intervention Provide education and support for participant on nutrition & aerobic/resistive exercise along with prescribed medications to achieve LDL 70mg , HDL >40mg .    Expected Outcomes Short Term: Participant states understanding of desired cholesterol values and is compliant with medications prescribed. Participant is following exercise prescription and nutrition guidelines.;Long Term: Cholesterol controlled with medications as prescribed, with individualized exercise RX and with personalized nutrition plan. Value goals: LDL < 70mg , HDL > 40 mg.           Tobacco Use Initial Evaluation: Social History   Tobacco Use  Smoking Status Never Smoker  Smokeless Tobacco Never Used    Exercise Goals and Review:  Exercise Goals    Row Name 11/06/19 1542             Exercise Goals   Increase Physical Activity Yes       Intervention Provide advice, education, support and counseling about physical activity/exercise needs.;Develop an individualized exercise prescription for aerobic and resistive training based on initial evaluation findings, risk stratification, comorbidities and participant's personal goals.       Expected Outcomes Short Term: Attend rehab on a regular basis to increase amount of physical activity.;Long Term: Add in home exercise to make exercise part of routine and to increase amount of physical activity.;Long Term: Exercising  regularly at least 3-5 days a week.       Increase Strength and Stamina Yes       Intervention Provide advice, education, support and counseling about physical activity/exercise needs.;Develop an individualized exercise prescription for aerobic and resistive training based on initial evaluation findings, risk stratification, comorbidities and participant's personal goals.       Expected Outcomes Short Term: Increase workloads from initial exercise prescription for resistance, speed, and METs.;Short Term: Perform resistance training exercises routinely during rehab and add in resistance training at home;Long Term: Improve cardiorespiratory fitness, muscular endurance and strength as measured by increased METs and functional capacity (11/08/19)       Able to understand and use rate of perceived exertion (RPE) scale Yes       Intervention Provide education and explanation on how to use RPE scale       Expected Outcomes Short Term: Able to use RPE daily in rehab to express subjective intensity level;Long Term:  Able to use RPE to guide intensity level when exercising independently       Knowledge and understanding of Target  Heart Rate Range (THRR) Yes       Intervention Provide education and explanation of THRR including how the numbers were predicted and where they are located for reference       Expected Outcomes Short Term: Able to state/look up THRR;Short Term: Able to use daily as guideline for intensity in rehab;Long Term: Able to use THRR to govern intensity when exercising independently       Able to check pulse independently Yes       Intervention Provide education and demonstration on how to check pulse in carotid and radial arteries.;Review the importance of being able to check your own pulse for safety during independent exercise       Expected Outcomes Short Term: Able to explain why pulse checking is important during independent exercise;Long Term: Able to check pulse independently and accurately        Understanding of Exercise Prescription Yes       Intervention Provide education, explanation, and written materials on patient's individual exercise prescription       Expected Outcomes Short Term: Able to explain program exercise prescription;Long Term: Able to explain home exercise prescription to exercise independently              Copy of goals given to participant.

## 2019-11-06 NOTE — Progress Notes (Signed)
Cardiac Individual Treatment Plan  Patient Details  Name: Alexandria Marquez MRN: [id number] Date of Birth: 1945/09/07 Referring Provider:     Cardiac Rehab from 11/06/2019 in [facility] Cardiac and Pulmonary Rehab  Referring Provider Arida      Initial Encounter Date:    Cardiac Rehab from 11/06/2019 in [facility] Cardiac and Pulmonary Rehab  Date 11/06/19      Visit Diagnosis: Status post coronary artery stent placement  Patient's Home Medications on Admission:  Current Outpatient Medications:  .  amLODipine (NORVASC) 5 MG tablet, Take 1 tablet (5 mg total) by mouth 2 (two) times daily., Disp: , Rfl:  .  aspirin 81 MG tablet, Take 1 tablet (81 mg total) by mouth daily., Disp: 30 tablet, Rfl: 0 .  atorvastatin (LIPITOR) 40 MG tablet, Take 1 tablet (40 mg total) by mouth at bedtime., Disp: 30 tablet, Rfl: 6 .  carvedilol (COREG) 6.25 MG tablet, Take 1 tablet (6.25 mg total) by mouth 2 (two) times daily with a meal., Disp: 180 tablet, Rfl: 3 .  clopidogrel (PLAVIX) 75 MG tablet, Take 1 tablet (75 mg total) by mouth daily with breakfast., Disp: 90 tablet, Rfl: 3 .  enalapril (VASOTEC) 2.5 MG tablet, Take 1 tablet (2.5 mg total) by mouth 2 (two) times daily., Disp: 60 tablet, Rfl: 3 .  fluticasone (FLONASE) 50 MCG/ACT nasal spray, Place 1-2 sprays into both nostrils daily as needed for allergies. , Disp: , Rfl:  .  gabapentin (NEURONTIN) 300 MG capsule, Take 600 mg by mouth at bedtime., Disp: , Rfl:  .  glimepiride (AMARYL) 2 MG tablet, Take 2 mg by mouth daily with breakfast., Disp: , Rfl:  .  glucose blood (ONETOUCH ULTRA) test strip, USE 2 (TWO) TIMES DAILY. USE AS INSTRUCTED., Disp: , Rfl:  .  insulin detemir (LEVEMIR) 100 UNIT/ML injection, Inject 27 Units into the skin daily. , Disp: , Rfl:  .  Insulin Pen Needle (FIFTY50 PEN NEEDLES) 31G X 5 MM MISC, Use as directed, Disp: , Rfl:  .  levothyroxine (SYNTHROID, LEVOTHROID) 75 MCG tablet, Take 75 mcg by mouth daily before breakfast., Disp: , Rfl:  .   metFORMIN (GLUCOPHAGE) 1000 MG tablet, Take 1 tablet (1,000 mg total) by mouth 2 (two) times daily with a meal. Resume on 10/21/19., Disp: , Rfl:  .  Misc Natural Products (APPLE CIDER VINEGAR DIET PO), Take 1 each by mouth in the morning and at bedtime. Golli Apple Cider, Disp: , Rfl:  .  nitroGLYCERIN (NITROSTAT) 0.4 MG SL tablet, Place 1 tablet (0.4 mg total) under the tongue every 5 (five) minutes as needed for chest pain., Disp: 25 tablet, Rfl: 3 .  ondansetron (ZOFRAN-ODT) 4 MG disintegrating tablet, Take 4 mg by mouth every 8 (eight) hours as needed for nausea or vomiting., Disp: , Rfl:  .  pantoprazole (PROTONIX) 40 MG tablet, Take 40 mg by mouth daily. , Disp: , Rfl:  .  Polyethyl Glycol-Propyl Glycol (SYSTANE) 0.4-0.3 % SOLN, Place 1 drop into both eyes 3 (three) times daily as needed (for dry eyes)., Disp: , Rfl:  .  sertraline (ZOLOFT) 100 MG tablet, Take 100 mg by mouth daily., Disp: , Rfl:  .  tiZANidine (ZANAFLEX) 2 MG tablet, Take 2 mg by mouth every 6 (six) hours as needed for muscle spasms., Disp: , Rfl:  .  VITAMIN D PO, Take 1 tablet by mouth daily. , Disp: , Rfl:   Past Medical History: Past Medical History:  Diagnosis Date  . Anxiety   .  Arthritis   . Asthma    when young  . Coronary artery disease   . Depression   . Diabetes mellitus without complication ([place])   . Environmental and seasonal allergies   . GERD (gastroesophageal reflux disease)   . History of hiatal hernia   . Hyperlipidemia   . Hypertension   . Hypothyroid   . Hypothyroidism   . Lower extremity edema   . Motion sickness   . OSA (obstructive sleep apnea) 06/04/2014  . Osteoarthritis   . Scoliosis   . Shortness of breath dyspnea    doe    Tobacco Use: Social History   Tobacco Use  Smoking Status Never Smoker  Smokeless Tobacco Never Used    Labs: Recent Review Flowsheet Data    Labs for ITP Cardiac and Pulmonary Rehab Latest Ref Rng & Units 12/19/2015 06/02/2016 10/18/2019 10/18/2019  10/19/2019   Cholestrol 0 - 200 mg/dL - - - - 131   LDLCALC 0 - 99 mg/dL - - - - 70   HDL >40 mg/dL - - - - 37(L)   Trlycerides <150 mg/dL - - - - 120   Hemoglobin A1c 4.8 - 5.6 % 7.4(H) 7.2(H) - - -   PHART 7.35 - 7.45 - - 7.386 - -   PCO2ART 32 - 48 mmHg - - 40.9 - -   HCO3 20.0 - 28.0 mmol/L - - 24.6 25.3 -   TCO2 22 - 32 mmol/L - - 26 27 -   O2SAT % - - 99.0 79.0 -       Exercise Target Goals: Exercise Program Goal: Individual exercise prescription set using results from initial 6 min walk test and THRR while considering  patient's activity barriers and safety.   Exercise Prescription Goal: Initial exercise prescription builds to 30-45 minutes a day of aerobic activity, 2-3 days per week.  Home exercise guidelines will be given to patient during program as part of exercise prescription that the participant will acknowledge.   Education: Aerobic Exercise & Resistance Training: - Gives group verbal and written instruction on the various components of exercise. Focuses on aerobic and resistive training programs and the benefits of this training and how to safely progress through these programs..   Education: Exercise & Equipment Safety: - Individual verbal instruction and demonstration of equipment use and safety with use of the equipment.   Cardiac Rehab from 11/06/2019 in [facility] Cardiac and Pulmonary Rehab  Date 11/06/19  Educator AS  Instruction Review Code 1- Verbalizes Understanding      Education: Exercise Physiology & General Exercise Guidelines: - Group verbal and written instruction with models to review the exercise physiology of the cardiovascular system and associated critical values. Provides general exercise guidelines with specific guidelines to those with heart or lung disease.    Education: Flexibility, Balance, Mind/Body Relaxation: Provides group verbal/written instruction on the benefits of flexibility and balance training, including mind/body exercise modes  such as yoga, pilates and tai chi.  Demonstration and skill practice provided.   Activity Barriers & Risk Stratification:  Activity Barriers & Cardiac Risk Stratification - 11/03/19 1310      Activity Barriers & Cardiac Risk Stratification   Activity Barriers Balance Concerns;Muscular Weakness;Deconditioning;Joint Problems;Back Problems;Right Knee Replacement;Other (comment)    Comments left rotator surgery 2013, back surgery 2014, broke right arm 2015, broke left ankle 2018    Cardiac Risk Stratification High           6 Minute Walk:  6 Minute Walk  [place] Name 11/06/19 1530         6 Minute Walk   Phase Initial     Distance 930 feet     Walk Time 6 minutes     # of Rest Breaks 0     MPH 1.76     METS 1.5     RPE 14     Perceived Dyspnea  2     VO2 Peak 5.3     Symptoms No     Resting HR 50 bpm     Resting BP 138/54     Resting Oxygen Saturation  99 %     Exercise Oxygen Saturation  during 6 min walk 98 %     Max Ex. HR 70 bpm     Max Ex. BP 158/68     2 Minute Post BP 130/64            Oxygen Initial Assessment:   Oxygen Re-Evaluation:   Oxygen Discharge (Final Oxygen Re-Evaluation):   Initial Exercise Prescription:  Initial Exercise Prescription - 11/06/19 1500      Date of Initial Exercise RX and Referring Provider   Date 11/06/19    Referring Provider Arida      Treadmill   MPH 1.5    Grade 0    Minutes 15      Recumbant Bike   Level 1    RPM 60    Watts 10    Minutes 15    METs 1.5      NuStep   Level 1    SPM 80    Minutes 15    METs 1.5      REL-XR   Level 1    Speed 50    Minutes 15    METs 1.5      Prescription Details   Frequency (times per week) 3    Duration Progress to 30 minutes of continuous aerobic without signs/symptoms of physical distress      Intensity   THRR 40-80% of Max Heartrate 88-127    Ratings of Perceived Exertion 11-13    Perceived Dyspnea 0-4      Resistance Training   Training Prescription Yes     Weight 3 lb    Reps 10-15           Perform Capillary Blood Glucose checks as needed.  Exercise Prescription Changes:  Exercise Prescription Changes    [place] Name 11/06/19 1500             Response to Exercise   Blood Pressure (Admit) 138/54       Blood Pressure (Exercise) 158/68       Blood Pressure (Exit) 130/64       Heart Rate (Admit) 50 bpm       Heart Rate (Exercise) 70 bpm       Heart Rate (Exit) 57 bpm       Oxygen Saturation (Admit) 99 %       Oxygen Saturation (Exercise) 98 %       Rating of Perceived Exertion (Exercise) 14       Perceived Dyspnea (Exercise) 2       Symptoms none       Comments walk test              Exercise Comments:   Exercise Goals and Review:  Exercise Goals    Row Name 11/06/19 1542  Exercise Goals   Increase Physical Activity Yes       Intervention Provide advice, education, support and counseling about physical activity/exercise needs.;Develop an individualized exercise prescription for aerobic and resistive training based on initial evaluation findings, risk stratification, comorbidities and participant's personal goals.       Expected Outcomes Short Term: Attend rehab on a regular basis to increase amount of physical activity.;Long Term: Add in home exercise to make exercise part of routine and to increase amount of physical activity.;Long Term: Exercising regularly at least 3-5 days a week.       Increase Strength and Stamina Yes       Intervention Provide advice, education, support and counseling about physical activity/exercise needs.;Develop an individualized exercise prescription for aerobic and resistive training based on initial evaluation findings, risk stratification, comorbidities and participant's personal goals.       Expected Outcomes Short Term: Increase workloads from initial exercise prescription for resistance, speed, and METs.;Short Term: Perform resistance training exercises routinely during rehab  and add in resistance training at home;Long Term: Improve cardiorespiratory fitness, muscular endurance and strength as measured by increased METs and functional capacity (6MWT)       Able to understand and use rate of perceived exertion (RPE) scale Yes       Intervention Provide education and explanation on how to use RPE scale       Expected Outcomes Short Term: Able to use RPE daily in rehab to express subjective intensity level;Long Term:  Able to use RPE to guide intensity level when exercising independently       Knowledge and understanding of Target Heart Rate Range (THRR) Yes       Intervention Provide education and explanation of THRR including how the numbers were predicted and where they are located for reference       Expected Outcomes Short Term: Able to state/look up THRR;Short Term: Able to use daily as guideline for intensity in rehab;Long Term: Able to use THRR to govern intensity when exercising independently       Able to check pulse independently Yes       Intervention Provide education and demonstration on how to check pulse in carotid and radial arteries.;Review the importance of being able to check your own pulse for safety during independent exercise       Expected Outcomes Short Term: Able to explain why pulse checking is important during independent exercise;Long Term: Able to check pulse independently and accurately       Understanding of Exercise Prescription Yes       Intervention Provide education, explanation, and written materials on patient's individual exercise prescription       Expected Outcomes Short Term: Able to explain program exercise prescription;Long Term: Able to explain home exercise prescription to exercise independently              Exercise Goals Re-Evaluation :   Discharge Exercise Prescription (Final Exercise Prescription Changes):  Exercise Prescription Changes - 11/06/19 1500      Response to Exercise   Blood Pressure (Admit) 138/54     Blood Pressure (Exercise) 158/68    Blood Pressure (Exit) 130/64    Heart Rate (Admit) 50 bpm    Heart Rate (Exercise) 70 bpm    Heart Rate (Exit) 57 bpm    Oxygen Saturation (Admit) 99 %    Oxygen Saturation (Exercise) 98 %    Rating of Perceived Exertion (Exercise) 14    Perceived Dyspnea (Exercise) 2  Symptoms none    Comments walk test           Nutrition:  Target Goals: Understanding of nutrition guidelines, daily intake of sodium <1543m, cholesterol <2049m calories 30% from fat and 7% or less from saturated fats, daily to have 5 or more servings of fruits and vegetables.  Education: Controlling Sodium/Reading Food Labels -Group verbal and written material supporting the discussion of sodium use in heart healthy nutrition. Review and explanation with models, verbal and written materials for utilization of the food label.   Education: General Nutrition Guidelines/Fats and Fiber: -Group instruction provided by verbal, written material, models and posters to present the general guidelines for heart healthy nutrition. Gives an explanation and review of dietary fats and fiber.   Biometrics:  Pre Biometrics - 11/06/19 1543      Pre Biometrics   Height _0  (1.549 m)    Weight 170 lb (77.1 kg)    BMI (Calculated) 32.14    Single Leg Stand 3.79 seconds            Nutrition Therapy Plan and Nutrition Goals:   Nutrition Assessments:  Nutrition Assessments - 11/03/19 1317      MEDFICTS Scores   Pre Score 38           MEDIFICTS Score Key:          ?70 Need to make dietary changes          40-70 Heart Healthy Diet         ? 40 Therapeutic Level Cholesterol Diet  Nutrition Goals Re-Evaluation:   Nutrition Goals Discharge (Final Nutrition Goals Re-Evaluation):   Psychosocial: Target Goals: Acknowledge presence or absence of significant depression and/or stress, maximize coping skills, provide positive support system. Participant is able to verbalize types  and ability to use techniques and skills needed for reducing stress and depression.   Education: Depression - Provides group verbal and written instruction on the correlation between heart/lung disease and depressed mood, treatment options, and the stigmas associated with seeking treatment.   Education: Sleep Hygiene -Provides group verbal and written instruction about how sleep can affect your health.  Define sleep hygiene, discuss sleep cycles and impact of sleep habits. Review good sleep hygiene tips.     Education: Stress and Anxiety: - Provides group verbal and written instruction about the health risks of elevated stress and causes of high stress.  Discuss the correlation between heart/lung disease and anxiety and treatment options. Review healthy ways to manage with stress and anxiety.    Initial Review & Psychosocial Screening:  Initial Psych Review & Screening - 11/03/19 1307      Initial Review   Current issues with Current Sleep Concerns;Current Stress Concerns    Source of Stress Concerns Unable to perform yard/household activities;Unable to participate in former interests or hobbies      [place]   husband, son and daughter in law     Barriers   Psychosocial barriers to participate in program There are no identifiable barriers or psychosocial needs.;The patient should benefit from training in stress management and relaxation.      Screening Interventions   Interventions Encouraged to exercise;Provide feedback about the scores to participant;To provide support and resources with identified psychosocial needs    Expected Outcomes Short Term goal: Utilizing psychosocial counselor, staff and physician to assist with identification of specific Stressors or current issues interfering with healing process. Setting desired goal for each stressor or  current issue identified.;Long Term Goal: Stressors or current issues are controlled or  eliminated.;Short Term goal: Identification and review with participant of any Quality of Life or Depression concerns found by scoring the questionnaire.;Long Term goal: The participant improves quality of Life and PHQ9 Scores as seen by post scores and/or verbalization of changes           Quality of Life Scores:   Quality of Life - 11/03/19 1329      Quality of Life   Select Quality of Life      Quality of Life Scores   Health/Function Pre 15.06 %    Socioeconomic Pre 20.56 %    Psych/Spiritual Pre 17.86 %    Family Pre 23.7 %    GLOBAL Pre 18.11 %          Scores of 19 and below usually indicate a poorer quality of life in these areas.  A difference of  2-3 points is a clinically meaningful difference.  A difference of 2-3 points in the total score of the Quality of Life Index has been associated with significant improvement in overall quality of life, self-image, physical symptoms, and general health in studies assessing change in quality of life.  PHQ-9: Recent Review Flowsheet Data    Depression screen [facility] 2/9 11/06/2019   Decreased Interest 3   Down, Depressed, Hopeless 2   PHQ - 2 Score 5   Altered sleeping 2   Tired, decreased energy 3   Change in appetite 1   Feeling bad or failure about yourself  0   Trouble concentrating 0   Moving slowly or fidgety/restless 0   Suicidal thoughts 0   PHQ-9 Score 11   Difficult doing work/chores Somewhat difficult     Interpretation of Total Score  Total Score Depression Severity:  1-4 = Minimal depression, 5-9 = Mild depression, 10-14 = Moderate depression, 15-19 = Moderately severe depression, 20-27 = Severe depression   Psychosocial Evaluation and Intervention:   Psychosocial Re-Evaluation:  Psychosocial Re-Evaluation    [place] Name 11/03/19 1332             Psychosocial Re-Evaluation   Current issues with Current Stress Concerns;Current Sleep Concerns       Comments Zella states she feels very tired and weak all the  time. This hasn't changed since her stents. Her husband is currently enrolled in Cardiac Rehab and they both think this will help her. She wants to feel stronger and have more energy.       Expected Outcomes Short: attend cardiac rehab for education and exercise. Long: develop positive self care habits.       Interventions Encouraged to attend Cardiac Rehabilitation for the exercise       Continue Psychosocial Services  Follow up required by staff         Initial Review   Source of Stress Concerns Unable to participate in former interests or hobbies;Unable to perform yard/household activities              Psychosocial Discharge (Final Psychosocial Re-Evaluation):  Psychosocial Re-Evaluation - 11/03/19 1332      Psychosocial Re-Evaluation   Current issues with Current Stress Concerns;Current Sleep Concerns    Comments Lacretia states she feels very tired and weak all the time. This hasn't changed since her stents. Her husband is currently enrolled in Cardiac Rehab and they both think this will help her. She wants to feel stronger and have more energy.    Expected Outcomes Short:  attend cardiac rehab for education and exercise. Long: develop positive self care habits.    Interventions Encouraged to attend Cardiac Rehabilitation for the exercise    Continue Psychosocial Services  Follow up required by staff      Initial Review   Source of Stress Concerns Unable to participate in former interests or hobbies;Unable to perform yard/household activities           Vocational Rehabilitation: Provide vocational rehab assistance to qualifying candidates.   Vocational Rehab Evaluation & Intervention:  Vocational Rehab - 11/03/19 1307      Initial Vocational Rehab Evaluation & Intervention   Assessment shows need for Vocational Rehabilitation No           Education: Education Goals: Education classes will be provided on a variety of topics geared toward better understanding of heart health  and risk factor modification. Participant will state understanding/return demonstration of topics presented as noted by education test scores.  Learning Barriers/Preferences:  Learning Barriers/Preferences - 11/03/19 1307      Learning Barriers/Preferences   Learning Barriers None    Learning Preferences None           General Cardiac Education Topics:  AED/CPR: - Group verbal and written instruction with the use of models to demonstrate the basic use of the AED with the basic ABC's of resuscitation.   Anatomy & Physiology of the Heart: - Group verbal and written instruction and models provide basic cardiac anatomy and physiology, with the coronary electrical and arterial systems. Review of Valvular disease and Heart Failure   Cardiac Procedures: - Group verbal and written instruction to review commonly prescribed medications for heart disease. Reviews the medication, class of the drug, and side effects. Includes the steps to properly store meds and maintain the prescription regimen. (beta blockers and nitrates)   Cardiac Medications I: - Group verbal and written instruction to review commonly prescribed medications for heart disease. Reviews the medication, class of the drug, and side effects. Includes the steps to properly store meds and maintain the prescription regimen.   Cardiac Medications II: -Group verbal and written instruction to review commonly prescribed medications for heart disease. Reviews the medication, class of the drug, and side effects. (all other drug classes)    Go Sex-Intimacy & Heart Disease, Get SMART - Goal Setting: - Group verbal and written instruction through game format to discuss heart disease and the return to sexual intimacy. Provides group verbal and written material to discuss and apply goal setting through the application of the S.M.A.R.T. Method.   Other Matters of the Heart: - Provides group verbal, written materials and models to  describe Stable Angina and Peripheral Artery. Includes description of the disease process and treatment options available to the cardiac patient.   Infection Prevention: - Provides verbal and written material to individual with discussion of infection control including proper hand washing and proper equipment cleaning during exercise session.   Cardiac Rehab from 11/06/2019 in [facility] Cardiac and Pulmonary Rehab  Date 11/06/19  Educator AS  Instruction Review Code 1- Verbalizes Understanding      Falls Prevention: - Provides verbal and written material to individual with discussion of falls prevention and safety.   Cardiac Rehab from 11/06/2019 in [facility] Cardiac and Pulmonary Rehab  Date 11/06/19  Educator AS  Instruction Review Code 1- Verbalizes Understanding      Other: -Provides group and verbal instruction on various topics (see comments)   Knowledge Questionnaire Score:  Knowledge Questionnaire Score - 11/03/19 1307  Knowledge Questionnaire Score   Pre Score 22/26           Core Components/Risk Factors/Patient Goals at Admission:  Personal Goals and Risk Factors at Admission - 11/06/19 1546      Core Components/Risk Factors/Patient Goals on Admission    Weight Management Weight Maintenance;Yes    Intervention Weight Management: Develop a combined nutrition and exercise program designed to reach desired caloric intake, while maintaining appropriate intake of nutrient and fiber, sodium and fats, and appropriate energy expenditure required for the weight goal.;Weight Management: Provide education and appropriate resources to help participant work on and attain dietary goals.    Admit Weight 170 lb (77.1 kg)    Goal Weight: Short Term 170 lb (77.1 kg)    Goal Weight: Long Term 170 lb (77.1 kg)    Expected Outcomes Short Term: Continue to assess and modify interventions until short term weight is achieved;Long Term: Adherence to nutrition and physical activity/exercise  program aimed toward attainment of established weight goal;Weight Maintenance: Understanding of the daily nutrition guidelines, which includes 25-35% calories from fat, 7% or less cal from saturated fats, less than 284m cholesterol, less than 1.5gm of sodium, & 5 or more servings of fruits and vegetables daily    Intervention Provide education about signs/symptoms and action to take for hypo/hyperglycemia.;Provide education about proper nutrition, including hydration, and aerobic/resistive exercise prescription along with prescribed medications to achieve blood glucose in normal ranges: Fasting glucose 65-99 mg/dL    Expected Outcomes Long Term: Attainment of HbA1C < 7%.;Short Term: Participant verbalizes understanding of the signs/symptoms and immediate care of hyper/hypoglycemia, proper foot care and importance of medication, aerobic/resistive exercise and nutrition plan for blood glucose control.    Hypertension Yes    Intervention Provide education on lifestyle modifcations including regular physical activity/exercise, weight management, moderate sodium restriction and increased consumption of fresh fruit, vegetables, and low fat dairy, alcohol moderation, and smoking cessation.;Monitor prescription use compliance.    Expected Outcomes Short Term: Continued assessment and intervention until BP is < 140/999mHG in hypertensive participants. < 130/8021mG in hypertensive participants with diabetes, heart failure or chronic kidney disease.;Long Term: Maintenance of blood pressure at goal levels.    Lipids Yes    Intervention Provide education and support for participant on nutrition & aerobic/resistive exercise along with prescribed medications to achieve LDL <6m57mDL >40mg65m Expected Outcomes Short Term: Participant states understanding of desired cholesterol values and is compliant with medications prescribed. Participant is following exercise prescription and nutrition guidelines.;Long Term:  Cholesterol controlled with medications as prescribed, with individualized exercise RX and with personalized nutrition plan. Value goals: LDL < 6mg,50m > 40 mg.           Education:Diabetes - Individual verbal and written instruction to review signs/symptoms of diabetes, desired ranges of glucose level fasting, after meals and with exercise. Acknowledge that pre and post exercise glucose checks will be done for 3 sessions at entry of program.   [facility]  Date 11/03/19  Educator [facility]  [facility] Review Code 1- VerbalUnited States Steel Corporationstanding      Education: Know Your Numbers and Risk Factors: -Group verbal and written instruction about important numbers in your health.  Discussion of what are risk factors and how they play a role in the disease process.  Review of Cholesterol, Blood Pressure, Diabetes, and BMI and the role they play in your overall health.   Core Components/Risk Factors/Patient Goals Review:  Core Components/Risk Factors/Patient Goals at Discharge (Final Review):    ITP Comments:  ITP Comments    Row Name 11/03/19 1304           ITP Comments Initial telephone encounter completed. Diagnosis can be found in [facility] 7/28. EP orientation scheduled for 8/16 at 2:30pm              Comments: initial ITP

## 2019-11-08 ENCOUNTER — Other Ambulatory Visit: Payer: Self-pay

## 2019-11-08 ENCOUNTER — Encounter: Payer: Medicare Other | Admitting: *Deleted

## 2019-11-08 DIAGNOSIS — Z955 Presence of coronary angioplasty implant and graft: Secondary | ICD-10-CM

## 2019-11-08 DIAGNOSIS — I10 Essential (primary) hypertension: Secondary | ICD-10-CM | POA: Diagnosis not present

## 2019-11-08 LAB — GLUCOSE, CAPILLARY
Glucose-Capillary: 74 mg/dL (ref 70–99)
Glucose-Capillary: 80 mg/dL (ref 70–99)

## 2019-11-08 NOTE — Progress Notes (Signed)
Daily Session Note  Patient Details  Name: CORTNEY BEISSEL MRN: [id number] Date of Birth: 10/09/1945 Referring Provider:     Cardiac Rehab from 11/06/2019 in [facility] Cardiac and Pulmonary Rehab  Referring Provider Fletcher Anon      Encounter Date: 11/08/2019  Check In:  Session Check In - 11/08/19 1749      Check-In   Supervising physician immediately available to respond to emergencies See telemetry face sheet for immediately available ER MD    Location [facility]-Cardiac & Pulmonary Rehab    Staff Present Nyoka Cowden, RN, BSN, MA;Joseph Hood [place], [place] Exercise Physiologist    Virtual Visit No    Medication changes reported     No    Fall or balance concerns reported    No    Warm-up and Cool-down Performed on first and last piece of equipment    Resistance Training Performed Yes    VAD Patient? No    PAD/SET Patient? No      Pain Assessment   Currently in Pain? No/denies              Social History   Tobacco Use  Smoking Status Never Smoker  Smokeless Tobacco Never Used    Goals Met:  [place] with exercise equipment Exercise tolerated well No report of cardiac concerns or symptoms Strength training completed today  Goals Unmet:  Not Applicable  Comments: Pt unable to exercise due to low blood sugar at 78. Given glucose tabs and crackers-remained low at 80. Exercise deferred today.  Dr. Emily Filbert is Medical Director for [place] and [facility].

## 2019-11-13 ENCOUNTER — Encounter: Payer: Medicare Other | Admitting: *Deleted

## 2019-11-13 ENCOUNTER — Other Ambulatory Visit: Payer: Self-pay

## 2019-11-13 DIAGNOSIS — Z955 Presence of coronary angioplasty implant and graft: Secondary | ICD-10-CM

## 2019-11-13 DIAGNOSIS — I10 Essential (primary) hypertension: Secondary | ICD-10-CM | POA: Diagnosis not present

## 2019-11-13 LAB — GLUCOSE, CAPILLARY
Glucose-Capillary: 174 mg/dL — ABNORMAL HIGH (ref 70–99)
Glucose-Capillary: 90 mg/dL (ref 70–99)

## 2019-11-13 NOTE — Progress Notes (Signed)
Daily Session Note  Patient Details  Name: Alexandria Marquez MRN: [id number] Date of Birth: July 01, 1945 Referring Provider:     Cardiac Rehab from 11/06/2019 in [facility] Cardiac and Pulmonary Rehab  Referring Provider Fletcher Anon      Encounter Date: 11/13/2019  Check In:  Session Check In - 11/13/19 7225      Check-In   Supervising physician immediately available to respond to emergencies See telemetry face sheet for immediately available ER MD    Location [facility]-Cardiac & Pulmonary Rehab    Staff Present Heath Lark, RN, BSN, CCRP;Jessica [place], MA, RCEP, CCRP, [place], [place], ACSM CEP, Exercise Physiologist;Kelly Amedeo Plenty, BS, ACSM CEP, Exercise Physiologist    Virtual Visit No    Medication changes reported     No    Fall or balance concerns reported    No    Warm-up and Cool-down Performed on first and last piece of equipment    Resistance Training Performed Yes    VAD Patient? No    PAD/SET Patient? No      Pain Assessment   Currently in Pain? No/denies              Social History   Tobacco Use  Smoking Status Never Smoker  Smokeless Tobacco Never Used    Goals Met:  Exercise tolerated well Personal goals reviewed No report of cardiac concerns or symptoms  Goals Unmet:  Not Applicable  Comments: First full day of exercise!  Patient was oriented to gym and equipment including functions, settings, policies, and procedures.  Patient's individual exercise prescription and treatment plan were reviewed.  All starting workloads were established based on the results of the 6 minute walk test done at initial orientation visit.  The plan for exercise progression was also introduced and progression will be customized based on patient's performance and goals.    Dr. Emily Filbert is Medical Director for [place] and [facility].

## 2019-11-15 ENCOUNTER — Other Ambulatory Visit: Payer: Self-pay

## 2019-11-15 ENCOUNTER — Encounter: Payer: Medicare Other | Admitting: *Deleted

## 2019-11-15 DIAGNOSIS — Z955 Presence of coronary angioplasty implant and graft: Secondary | ICD-10-CM

## 2019-11-15 DIAGNOSIS — I10 Essential (primary) hypertension: Secondary | ICD-10-CM | POA: Diagnosis not present

## 2019-11-15 LAB — GLUCOSE, CAPILLARY
Glucose-Capillary: 75 mg/dL (ref 70–99)
Glucose-Capillary: 92 mg/dL (ref 70–99)

## 2019-11-15 NOTE — Progress Notes (Signed)
Incomplete Session Note  Patient Details  Name: Alexandria Marquez MRN: [id number] Date of Birth: 10/06/1945 Referring Provider:     Cardiac Rehab from 11/06/2019 in [facility] Cardiac and Pulmonary Rehab  Referring Provider [name] did not complete her rehab session.  Her CBG was too low for exercise. She was safe to leave and education given on what to eat prior to exercise.

## 2019-11-20 ENCOUNTER — Other Ambulatory Visit: Payer: Self-pay

## 2019-11-20 ENCOUNTER — Encounter: Payer: Medicare Other | Admitting: *Deleted

## 2019-11-20 DIAGNOSIS — Z955 Presence of coronary angioplasty implant and graft: Secondary | ICD-10-CM

## 2019-11-20 NOTE — Progress Notes (Signed)
Cardiac Individual Treatment Plan  Patient Details  Name: Alexandria Marquez MRN: [id number] Date of Birth: 1945/09/07 Referring Provider:     Cardiac Rehab from 11/06/2019 in [facility] Cardiac and Pulmonary Rehab  Referring Provider Arida      Initial Encounter Date:    Cardiac Rehab from 11/06/2019 in [facility] Cardiac and Pulmonary Rehab  Date 11/06/19      Visit Diagnosis: Status post coronary artery stent placement  Patient's Home Medications on Admission:  Current Outpatient Medications:  .  amLODipine (NORVASC) 5 MG tablet, Take 1 tablet (5 mg total) by mouth 2 (two) times daily., Disp: , Rfl:  .  aspirin 81 MG tablet, Take 1 tablet (81 mg total) by mouth daily., Disp: 30 tablet, Rfl: 0 .  atorvastatin (LIPITOR) 40 MG tablet, Take 1 tablet (40 mg total) by mouth at bedtime., Disp: 30 tablet, Rfl: 6 .  carvedilol (COREG) 6.25 MG tablet, Take 1 tablet (6.25 mg total) by mouth 2 (two) times daily with a meal., Disp: 180 tablet, Rfl: 3 .  clopidogrel (PLAVIX) 75 MG tablet, Take 1 tablet (75 mg total) by mouth daily with breakfast., Disp: 90 tablet, Rfl: 3 .  enalapril (VASOTEC) 2.5 MG tablet, Take 1 tablet (2.5 mg total) by mouth 2 (two) times daily., Disp: 60 tablet, Rfl: 3 .  fluticasone (FLONASE) 50 MCG/ACT nasal spray, Place 1-2 sprays into both nostrils daily as needed for allergies. , Disp: , Rfl:  .  gabapentin (NEURONTIN) 300 MG capsule, Take 600 mg by mouth at bedtime., Disp: , Rfl:  .  glimepiride (AMARYL) 2 MG tablet, Take 2 mg by mouth daily with breakfast., Disp: , Rfl:  .  glucose blood (ONETOUCH ULTRA) test strip, USE 2 (TWO) TIMES DAILY. USE AS INSTRUCTED., Disp: , Rfl:  .  insulin detemir (LEVEMIR) 100 UNIT/ML injection, Inject 27 Units into the skin daily. , Disp: , Rfl:  .  Insulin Pen Needle (FIFTY50 PEN NEEDLES) 31G X 5 MM MISC, Use as directed, Disp: , Rfl:  .  levothyroxine (SYNTHROID, LEVOTHROID) 75 MCG tablet, Take 75 mcg by mouth daily before breakfast., Disp: , Rfl:  .   metFORMIN (GLUCOPHAGE) 1000 MG tablet, Take 1 tablet (1,000 mg total) by mouth 2 (two) times daily with a meal. Resume on 10/21/19., Disp: , Rfl:  .  Misc Natural Products (APPLE CIDER VINEGAR DIET PO), Take 1 each by mouth in the morning and at bedtime. Golli Apple Cider, Disp: , Rfl:  .  nitroGLYCERIN (NITROSTAT) 0.4 MG SL tablet, Place 1 tablet (0.4 mg total) under the tongue every 5 (five) minutes as needed for chest pain., Disp: 25 tablet, Rfl: 3 .  ondansetron (ZOFRAN-ODT) 4 MG disintegrating tablet, Take 4 mg by mouth every 8 (eight) hours as needed for nausea or vomiting., Disp: , Rfl:  .  pantoprazole (PROTONIX) 40 MG tablet, Take 40 mg by mouth daily. , Disp: , Rfl:  .  Polyethyl Glycol-Propyl Glycol (SYSTANE) 0.4-0.3 % SOLN, Place 1 drop into both eyes 3 (three) times daily as needed (for dry eyes)., Disp: , Rfl:  .  sertraline (ZOLOFT) 100 MG tablet, Take 100 mg by mouth daily., Disp: , Rfl:  .  tiZANidine (ZANAFLEX) 2 MG tablet, Take 2 mg by mouth every 6 (six) hours as needed for muscle spasms., Disp: , Rfl:  .  VITAMIN D PO, Take 1 tablet by mouth daily. , Disp: , Rfl:   Past Medical History: Past Medical History:  Diagnosis Date  . Anxiety   .  Arthritis   . Asthma    when young  . Coronary artery disease   . Depression   . Diabetes mellitus without complication ([place])   . Environmental and seasonal allergies   . GERD (gastroesophageal reflux disease)   . History of hiatal hernia   . Hyperlipidemia   . Hypertension   . Hypothyroid   . Hypothyroidism   . Lower extremity edema   . Motion sickness   . OSA (obstructive sleep apnea) 06/04/2014  . Osteoarthritis   . Scoliosis   . Shortness of breath dyspnea    doe    Tobacco Use: Social History   Tobacco Use  Smoking Status Never Smoker  Smokeless Tobacco Never Used    Labs: Recent Review Flowsheet Data    Labs for ITP Cardiac and Pulmonary Rehab Latest Ref Rng & Units 12/19/2015 06/02/2016 10/18/2019 10/18/2019  10/19/2019   Cholestrol 0 - 200 mg/dL - - - - 131   LDLCALC 0 - 99 mg/dL - - - - 70   HDL >40 mg/dL - - - - 37(L)   Trlycerides <150 mg/dL - - - - 120   Hemoglobin A1c 4.8 - 5.6 % 7.4(H) 7.2(H) - - -   PHART 7.35 - 7.45 - - 7.386 - -   PCO2ART 32 - 48 mmHg - - 40.9 - -   HCO3 20.0 - 28.0 mmol/L - - 24.6 25.3 -   TCO2 22 - 32 mmol/L - - 26 27 -   O2SAT % - - 99.0 79.0 -       Exercise Target Goals: Exercise Program Goal: Individual exercise prescription set using results from initial 6 min walk test and THRR while considering  patient's activity barriers and safety.   Exercise Prescription Goal: Initial exercise prescription builds to 30-45 minutes a day of aerobic activity, 2-3 days per week.  Home exercise guidelines will be given to patient during program as part of exercise prescription that the participant will acknowledge.   Education: Aerobic Exercise & Resistance Training: - Gives group verbal and written instruction on the various components of exercise. Focuses on aerobic and resistive training programs and the benefits of this training and how to safely progress through these programs..   Cardiac Rehab from 11/08/2019 in [facility] Cardiac and Pulmonary Rehab  Date 11/08/19  Educator AS  Instruction Review Code 1- Verbalizes Understanding      Education: Exercise & Equipment Safety: - Individual verbal instruction and demonstration of equipment use and safety with use of the equipment.   Cardiac Rehab from 11/08/2019 in [facility] Cardiac and Pulmonary Rehab  Date 11/06/19  Educator AS  Instruction Review Code 1- Verbalizes Understanding      Education: Exercise Physiology & General Exercise Guidelines: - Group verbal and written instruction with models to review the exercise physiology of the cardiovascular system and associated critical values. Provides general exercise guidelines with specific guidelines to those with heart or lung disease.    Education: Flexibility,  Balance, Mind/Body Relaxation: Provides group verbal/written instruction on the benefits of flexibility and balance training, including mind/body exercise modes such as yoga, pilates and tai chi.  Demonstration and skill practice provided.   Activity Barriers & Risk Stratification:  Activity Barriers & Cardiac Risk Stratification - 11/03/19 1310      Activity Barriers & Cardiac Risk Stratification   Activity Barriers Balance Concerns;Muscular Weakness;Deconditioning;Joint Problems;Back Problems;Right Knee Replacement;Other (comment)    Comments left rotator surgery 2013, back surgery 2014, broke right arm 2015, broke left  ankle 2018    Cardiac Risk Stratification High           6 Minute Walk:  6 Minute Walk    Row Name 11/06/19 1530         6 Minute Walk   Phase Initial     Distance 930 feet     Walk Time 6 minutes     # of Rest Breaks 0     MPH 1.76     METS 1.5     RPE 14     Perceived Dyspnea  2     VO2 Peak 5.3     Symptoms No     Resting HR 50 bpm     Resting BP 138/54     Resting Oxygen Saturation  99 %     Exercise Oxygen Saturation  during 6 min walk 98 %     Max Ex. HR 70 bpm     Max Ex. BP 158/68     2 Minute Post BP 130/64            Oxygen Initial Assessment:   Oxygen Re-Evaluation:   Oxygen Discharge (Final Oxygen Re-Evaluation):   Initial Exercise Prescription:  Initial Exercise Prescription - 11/06/19 1500      Date of Initial Exercise RX and Referring Provider   Date 11/06/19    Referring Provider Arida      Treadmill   MPH 1.5    Grade 0    Minutes 15      Recumbant Bike   Level 1    RPM 60    Watts 10    Minutes 15    METs 1.5      NuStep   Level 1    SPM 80    Minutes 15    METs 1.5      REL-XR   Level 1    Speed 50    Minutes 15    METs 1.5      Prescription Details   Frequency (times per week) 3    Duration Progress to 30 minutes of continuous aerobic without signs/symptoms of physical distress       Intensity   THRR 40-80% of Max Heartrate 88-127    Ratings of Perceived Exertion 11-13    Perceived Dyspnea 0-4      Resistance Training   Training Prescription Yes    Weight 3 lb    Reps 10-15           Perform Capillary Blood Glucose checks as needed.  Exercise Prescription Changes:  Exercise Prescription Changes    Row Name 11/06/19 1500             Response to Exercise   Blood Pressure (Admit) 138/54       Blood Pressure (Exercise) 158/68       Blood Pressure (Exit) 130/64       Heart Rate (Admit) 50 bpm       Heart Rate (Exercise) 70 bpm       Heart Rate (Exit) 57 bpm       Oxygen Saturation (Admit) 99 %       Oxygen Saturation (Exercise) 98 %       Rating of Perceived Exertion (Exercise) 14       Perceived Dyspnea (Exercise) 2       Symptoms none       Comments walk test              Exercise  Comments:  Exercise Comments    Row Name 11/13/19 1640           Exercise Comments First full day of exercise!  Patient was oriented to gym and equipment including functions, settings, policies, and procedures.  Patient's individual exercise prescription and treatment plan were reviewed.  All starting workloads were established based on the results of the 6 minute walk test done at initial orientation visit.  The plan for exercise progression was also introduced and progression will be customized based on patient's performance and goals.              Exercise Goals and Review:  Exercise Goals    Row Name 11/06/19 1542             Exercise Goals   Increase Physical Activity Yes       Intervention Provide advice, education, support and counseling about physical activity/exercise needs.;Develop an individualized exercise prescription for aerobic and resistive training based on initial evaluation findings, risk stratification, comorbidities and participant's personal goals.       Expected Outcomes Short Term: Attend rehab on a regular basis to increase amount  of physical activity.;Long Term: Add in home exercise to make exercise part of routine and to increase amount of physical activity.;Long Term: Exercising regularly at least 3-5 days a week.       Increase Strength and Stamina Yes       Intervention Provide advice, education, support and counseling about physical activity/exercise needs.;Develop an individualized exercise prescription for aerobic and resistive training based on initial evaluation findings, risk stratification, comorbidities and participant's personal goals.       Expected Outcomes Short Term: Increase workloads from initial exercise prescription for resistance, speed, and METs.;Short Term: Perform resistance training exercises routinely during rehab and add in resistance training at home;Long Term: Improve cardiorespiratory fitness, muscular endurance and strength as measured by increased METs and functional capacity (6MWT)       Able to understand and use rate of perceived exertion (RPE) scale Yes       Intervention Provide education and explanation on how to use RPE scale       Expected Outcomes Short Term: Able to use RPE daily in rehab to express subjective intensity level;Long Term:  Able to use RPE to guide intensity level when exercising independently       Knowledge and understanding of Target Heart Rate Range (THRR) Yes       Intervention Provide education and explanation of THRR including how the numbers were predicted and where they are located for reference       Expected Outcomes Short Term: Able to state/look up THRR;Short Term: Able to use daily as guideline for intensity in rehab;Long Term: Able to use THRR to govern intensity when exercising independently       Able to check pulse independently Yes       Intervention Provide education and demonstration on how to check pulse in carotid and radial arteries.;Review the importance of being able to check your own pulse for safety during independent exercise       Expected  Outcomes Short Term: Able to explain why pulse checking is important during independent exercise;Long Term: Able to check pulse independently and accurately       Understanding of Exercise Prescription Yes       Intervention Provide education, explanation, and written materials on patient's individual exercise prescription       Expected Outcomes Short Term: Able to explain program  exercise prescription;Long Term: Able to explain home exercise prescription to exercise independently              Exercise Goals Re-Evaluation :  Exercise Goals Re-Evaluation    [place] Name 11/13/19 1640             Exercise Goal Re-Evaluation   Exercise Goals Review Knowledge and understanding of Target Heart Rate Range (THRR);Able to understand and use rate of perceived exertion (RPE) scale;Understanding of Exercise Prescription       Comments Reviewed RPE and dyspnea scales, THR and program prescription with pt today.  Pt voiced understanding and was given a copy of goals to take home.       Expected Outcomes Short: Use RPE daily to regulate intensity. Long: Follow program prescription in THR.              Discharge Exercise Prescription (Final Exercise Prescription Changes):  Exercise Prescription Changes - 11/06/19 1500      Response to Exercise   Blood Pressure (Admit) 138/54    Blood Pressure (Exercise) 158/68    Blood Pressure (Exit) 130/64    Heart Rate (Admit) 50 bpm    Heart Rate (Exercise) 70 bpm    Heart Rate (Exit) 57 bpm    Oxygen Saturation (Admit) 99 %    Oxygen Saturation (Exercise) 98 %    Rating of Perceived Exertion (Exercise) 14    Perceived Dyspnea (Exercise) 2    Symptoms none    Comments walk test           Nutrition:  Target Goals: Understanding of nutrition guidelines, daily intake of sodium '1500mg'$ , cholesterol '200mg'$ , calories 30% from fat and 7% or less from saturated fats, daily to have 5 or more servings of fruits and vegetables.  Education: Controlling  Sodium/Reading Food Labels -Group verbal and written material supporting the discussion of sodium use in heart healthy nutrition. Review and explanation with models, verbal and written materials for utilization of the food label.   Education: General Nutrition Guidelines/Fats and Fiber: -Group instruction provided by verbal, written material, models and posters to present the general guidelines for heart healthy nutrition. Gives an explanation and review of dietary fats and fiber.   Biometrics:  Pre Biometrics - 11/06/19 1543      Pre Biometrics   Height $Remov'5\' 1"'YhFIFj$  (1.549 m)    Weight 170 lb (77.1 kg)    BMI (Calculated) 32.14    Single Leg Stand 3.79 seconds            Nutrition Therapy Plan and Nutrition Goals:  Nutrition Therapy & Goals - 11/20/19 1049      Personal Nutrition Goals   Comments Stent put in colon - from colitis. Waiting to have it re-evaluted, but as of right now it has been put on hold due to [place]. A1c: ~7 or 8 last year. T2DM since 2002 or 2003. Wants to see a edocrenologist. Discussed T2DM MNT. Pt reports knowing what to do, it's just a matte rof doing it. Pt has decided to discharge at this time.           Nutrition Assessments:  Nutrition Assessments - 11/03/19 1317      MEDFICTS Scores   Pre Score 38           MEDIFICTS Score Key:          ?70 Need to make dietary changes          40-70 Heart Healthy Diet         ?  40 Therapeutic Level Cholesterol Diet  Nutrition Goals Re-Evaluation:   Nutrition Goals Discharge (Final Nutrition Goals Re-Evaluation):   Psychosocial: Target Goals: Acknowledge presence or absence of significant depression and/or stress, maximize coping skills, provide positive support system. Participant is able to verbalize types and ability to use techniques and skills needed for reducing stress and depression.   Education: Depression - Provides group verbal and written instruction on the correlation between heart/lung  disease and depressed mood, treatment options, and the stigmas associated with seeking treatment.   Education: Sleep Hygiene -Provides group verbal and written instruction about how sleep can affect your health.  Define sleep hygiene, discuss sleep cycles and impact of sleep habits. Review good sleep hygiene tips.     Education: Stress and Anxiety: - Provides group verbal and written instruction about the health risks of elevated stress and causes of high stress.  Discuss the correlation between heart/lung disease and anxiety and treatment options. Review healthy ways to manage with stress and anxiety.    Initial Review & Psychosocial Screening:  Initial Psych Review & Screening - 11/03/19 1307      Initial Review   Current issues with Current Sleep Concerns;Current Stress Concerns    Source of Stress Concerns Unable to perform yard/household activities;Unable to participate in former interests or hobbies      Family Dynamics   Good Support System? Yes   husband, son and daughter in law     Barriers   Psychosocial barriers to participate in program There are no identifiable barriers or psychosocial needs.;The patient should benefit from training in stress management and relaxation.      Screening Interventions   Interventions Encouraged to exercise;Provide feedback about the scores to participant;To provide support and resources with identified psychosocial needs    Expected Outcomes Short Term goal: Utilizing psychosocial counselor, staff and physician to assist with identification of specific Stressors or current issues interfering with healing process. Setting desired goal for each stressor or current issue identified.;Long Term Goal: Stressors or current issues are controlled or eliminated.;Short Term goal: Identification and review with participant of any Quality of Life or Depression concerns found by scoring the questionnaire.;Long Term goal: The participant improves quality of  Life and PHQ9 Scores as seen by post scores and/or verbalization of changes           Quality of Life Scores:   Quality of Life - 11/03/19 1329      Quality of Life   Select Quality of Life      Quality of Life Scores   Health/Function Pre 15.06 %    Socioeconomic Pre 20.56 %    Psych/Spiritual Pre 17.86 %    Family Pre 23.7 %    GLOBAL Pre 18.11 %          Scores of 19 and below usually indicate a poorer quality of life in these areas.  A difference of  2-3 points is a clinically meaningful difference.  A difference of 2-3 points in the total score of the Quality of Life Index has been associated with significant improvement in overall quality of life, self-image, physical symptoms, and general health in studies assessing change in quality of life.  PHQ-9: Recent Review Flowsheet Data    Depression screen [facility] 2/9 11/06/2019   Decreased Interest 3   Down, Depressed, Hopeless 2   PHQ - 2 Score 5   Altered sleeping 2   Tired, decreased energy 3   Change in appetite 1   Feeling  bad or failure about yourself  0   Trouble concentrating 0   Moving slowly or fidgety/restless 0   Suicidal thoughts 0   PHQ-9 Score 11   Difficult doing work/chores Somewhat difficult     Interpretation of Total Score  Total Score Depression Severity:  1-4 = Minimal depression, 5-9 = Mild depression, 10-14 = Moderate depression, 15-19 = Moderately severe depression, 20-27 = Severe depression   Psychosocial Evaluation and Intervention:   Psychosocial Re-Evaluation:  Psychosocial Re-Evaluation    [place] Name 11/03/19 1332             Psychosocial Re-Evaluation   Current issues with Current Stress Concerns;Current Sleep Concerns       Comments Colleena states she feels very tired and weak all the time. This hasn't changed since her stents. Her husband is currently enrolled in Cardiac Rehab and they both think this will help her. She wants to feel stronger and have more energy.       Expected  Outcomes Short: attend cardiac rehab for education and exercise. Long: develop positive self care habits.       Interventions Encouraged to attend Cardiac Rehabilitation for the exercise       Continue Psychosocial Services  Follow up required by staff         Initial Review   Source of Stress Concerns Unable to participate in former interests or hobbies;Unable to perform yard/household activities              Psychosocial Discharge (Final Psychosocial Re-Evaluation):  Psychosocial Re-Evaluation - 11/03/19 1332      Psychosocial Re-Evaluation   Current issues with Current Stress Concerns;Current Sleep Concerns    Comments Keyanah states she feels very tired and weak all the time. This hasn't changed since her stents. Her husband is currently enrolled in Cardiac Rehab and they both think this will help her. She wants to feel stronger and have more energy.    Expected Outcomes Short: attend cardiac rehab for education and exercise. Long: develop positive self care habits.    Interventions Encouraged to attend Cardiac Rehabilitation for the exercise    Continue Psychosocial Services  Follow up required by staff      Initial Review   Source of Stress Concerns Unable to participate in former interests or hobbies;Unable to perform yard/household activities           Vocational Rehabilitation: Provide vocational rehab assistance to qualifying candidates.   Vocational Rehab Evaluation & Intervention:  Vocational Rehab - 11/03/19 1307      Initial Vocational Rehab Evaluation & Intervention   Assessment shows need for Vocational Rehabilitation No           Education: Education Goals: Education classes will be provided on a variety of topics geared toward better understanding of heart health and risk factor modification. Participant will state understanding/return demonstration of topics presented as noted by education test scores.  Learning Barriers/Preferences:  Learning  Barriers/Preferences - 11/03/19 1307      Learning Barriers/Preferences   Learning Barriers None    Learning Preferences None           General Cardiac Education Topics:  AED/CPR: - Group verbal and written instruction with the use of models to demonstrate the basic use of the AED with the basic ABC's of resuscitation.   Anatomy & Physiology of the Heart: - Group verbal and written instruction and models provide basic cardiac anatomy and physiology, with the coronary electrical and arterial systems. Review of  Valvular disease and Heart Failure   Cardiac Procedures: - Group verbal and written instruction to review commonly prescribed medications for heart disease. Reviews the medication, class of the drug, and side effects. Includes the steps to properly store meds and maintain the prescription regimen. (beta blockers and nitrates)   Cardiac Medications I: - Group verbal and written instruction to review commonly prescribed medications for heart disease. Reviews the medication, class of the drug, and side effects. Includes the steps to properly store meds and maintain the prescription regimen.   Cardiac Medications II: -Group verbal and written instruction to review commonly prescribed medications for heart disease. Reviews the medication, class of the drug, and side effects. (all other drug classes)    Go Sex-Intimacy & Heart Disease, Get SMART - Goal Setting: - Group verbal and written instruction through game format to discuss heart disease and the return to sexual intimacy. Provides group verbal and written material to discuss and apply goal setting through the application of the S.M.A.R.T. Method.   Other Matters of the Heart: - Provides group verbal, written materials and models to describe Stable Angina and Peripheral Artery. Includes description of the disease process and treatment options available to the cardiac patient.   Infection Prevention: - Provides verbal and  written material to individual with discussion of infection control including proper hand washing and proper equipment cleaning during exercise session.   Cardiac Rehab from 11/08/2019 in [facility] Cardiac and Pulmonary Rehab  Date 11/06/19  Educator AS  Instruction Review Code 1- Verbalizes Understanding      Falls Prevention: - Provides verbal and written material to individual with discussion of falls prevention and safety.   Cardiac Rehab from 11/08/2019 in [facility] Cardiac and Pulmonary Rehab  Date 11/06/19  Educator AS  Instruction Review Code 1- Verbalizes Understanding      Other: -Provides group and verbal instruction on various topics (see comments)   Knowledge Questionnaire Score:  Knowledge Questionnaire Score - 11/03/19 1307      Knowledge Questionnaire Score   Pre Score 22/26           Core Components/Risk Factors/Patient Goals at Admission:  Personal Goals and Risk Factors at Admission - 11/06/19 1546      Core Components/Risk Factors/Patient Goals on Admission    Weight Management Weight Maintenance;Yes    Intervention Weight Management: Develop a combined nutrition and exercise program designed to reach desired caloric intake, while maintaining appropriate intake of nutrient and fiber, sodium and fats, and appropriate energy expenditure required for the weight goal.;Weight Management: Provide education and appropriate resources to help participant work on and attain dietary goals.    Admit Weight 170 lb (77.1 kg)    Goal Weight: Short Term 170 lb (77.1 kg)    Goal Weight: Long Term 170 lb (77.1 kg)    Expected Outcomes Short Term: Continue to assess and modify interventions until short term weight is achieved;Long Term: Adherence to nutrition and physical activity/exercise program aimed toward attainment of established weight goal;Weight Maintenance: Understanding of the daily nutrition guidelines, which includes 25-35% calories from fat, 7% or less cal from saturated  fats, less than $RemoveB'200mg'GzUywnsd$  cholesterol, less than 1.5gm of sodium, & 5 or more servings of fruits and vegetables daily    Intervention Provide education about signs/symptoms and action to take for hypo/hyperglycemia.;Provide education about proper nutrition, including hydration, and aerobic/resistive exercise prescription along with prescribed medications to achieve blood glucose in normal ranges: Fasting glucose 65-99 mg/dL    Expected Outcomes Long  Term: Attainment of HbA1C < 7%.;Short Term: Participant verbalizes understanding of the signs/symptoms and immediate care of hyper/hypoglycemia, proper foot care and importance of medication, aerobic/resistive exercise and nutrition plan for blood glucose control.    Hypertension Yes    Intervention Provide education on lifestyle modifcations including regular physical activity/exercise, weight management, moderate sodium restriction and increased consumption of fresh fruit, vegetables, and low fat dairy, alcohol moderation, and smoking cessation.;Monitor prescription use compliance.    Expected Outcomes Short Term: Continued assessment and intervention until BP is < 140/55mm HG in hypertensive participants. < 130/5mm HG in hypertensive participants with diabetes, heart failure or chronic kidney disease.;Long Term: Maintenance of blood pressure at goal levels.    Lipids Yes    Intervention Provide education and support for participant on nutrition & aerobic/resistive exercise along with prescribed medications to achieve LDL '70mg'$ , HDL >$Remo'40mg'gvfSZ$ .    Expected Outcomes Short Term: Participant states understanding of desired cholesterol values and is compliant with medications prescribed. Participant is following exercise prescription and nutrition guidelines.;Long Term: Cholesterol controlled with medications as prescribed, with individualized exercise RX and with personalized nutrition plan. Value goals: LDL < $Rem'70mg'ZzMy$ , HDL > 40 mg.           Education:Diabetes -  Individual verbal and written instruction to review signs/symptoms of diabetes, desired ranges of glucose level fasting, after meals and with exercise. Acknowledge that pre and post exercise glucose checks will be done for 3 sessions at entry of program.   Cardiac Rehab from 11/03/2019 in [facility]  Date 11/03/19  Educator [facility]  Instruction Review Code 1- [employer] Understanding      Education: Know Your Numbers and Risk Factors: -Group verbal and written instruction about important numbers in your health.  Discussion of what are risk factors and how they play a role in the disease process.  Review of Cholesterol, Blood Pressure, Diabetes, and BMI and the role they play in your overall health.   Core Components/Risk Factors/Patient Goals Review:    Core Components/Risk Factors/Patient Goals at Discharge (Final Review):    ITP Comments:  ITP Comments    Row Name 11/03/19 1304 11/06/19 1548 11/13/19 1640 11/20/19 1306     ITP Comments Initial telephone encounter completed. Diagnosis can be found in [facility] 7/28. EP orientation scheduled for 8/16 at 2:30pm Completed 6MWT and gym orientation. Initial ITP created and sent for review to Dr. Emily Filbert, Medical Director. First full day of exercise!  Patient was oriented to gym and equipment including functions, settings, policies, and procedures.  Patient's individual exercise prescription and treatment plan were reviewed.  All starting workloads were established based on the results of the 6 minute walk test done at initial orientation visit.  The plan for exercise progression was also introduced and progression will be customized based on patient's performance and goals. After Melissa had a chance to talk with Ashlye today she mentioned that she would like to discharge from the program at this time.  She is dealing with her and her son's depression and going through a rough patch. We talked about how exercise could help, but she  still wished to discharge.  Discharge ITP sent for review.           Comments:  Discharge ITP, completed two sessions

## 2019-11-24 ENCOUNTER — Other Ambulatory Visit: Payer: Self-pay

## 2019-11-24 ENCOUNTER — Telehealth: Payer: Self-pay | Admitting: Cardiovascular Disease

## 2019-11-24 NOTE — Telephone Encounter (Signed)
*  STAT* If patient is at the pharmacy, call can be transferred to refill team.   1. Which medications need to be refilled? (please list name of each medication and dose if known)  Carvedilol (COREG) 6.25 mg Clopidogrel (PLAVIX) 75 mg  2. Which pharmacy/location (including street and city if local pharmacy) is medication to be sent to? [facility] on [address]   3. Do they need a 30 day or 90 day supply? 90 day

## 2019-12-11 ENCOUNTER — Other Ambulatory Visit: Payer: Self-pay

## 2019-12-11 ENCOUNTER — Ambulatory Visit (INDEPENDENT_AMBULATORY_CARE_PROVIDER_SITE_OTHER): Payer: Medicare Other | Admitting: Family

## 2019-12-11 ENCOUNTER — Other Ambulatory Visit
Admission: RE | Admit: 2019-12-11 | Discharge: 2019-12-11 | Disposition: A | Discharge status: Home / Self Care | Payer: Medicare Other | Attending: Family | Admitting: Family

## 2019-12-11 ENCOUNTER — Encounter: Payer: Self-pay | Admitting: Family

## 2019-12-11 VITALS — BP 140/70 | HR 57 | Ht 60.25 in | Wt 171.6 lb

## 2019-12-11 DIAGNOSIS — I25118 Atherosclerotic heart disease of native coronary artery with other forms of angina pectoris: Secondary | ICD-10-CM | POA: Diagnosis present

## 2019-12-11 DIAGNOSIS — I739 Peripheral vascular disease, unspecified: Secondary | ICD-10-CM

## 2019-12-11 DIAGNOSIS — E785 Hyperlipidemia, unspecified: Secondary | ICD-10-CM

## 2019-12-11 DIAGNOSIS — I25119 Atherosclerotic heart disease of native coronary artery with unspecified angina pectoris: Secondary | ICD-10-CM | POA: Insufficient documentation

## 2019-12-11 DIAGNOSIS — I10 Essential (primary) hypertension: Secondary | ICD-10-CM

## 2019-12-11 DIAGNOSIS — I272 Pulmonary hypertension, unspecified: Secondary | ICD-10-CM | POA: Diagnosis not present

## 2019-12-11 DIAGNOSIS — G4733 Obstructive sleep apnea (adult) (pediatric): Secondary | ICD-10-CM

## 2019-12-11 LAB — BASIC METABOLIC PANEL
Anion gap: 8 (ref 5–15)
BUN: 21 mg/dL (ref 8–23)
CO2: 28 mmol/L (ref 22–32)
Calcium: 9.6 mg/dL (ref 8.9–10.3)
Chloride: 100 mmol/L (ref 98–111)
Creatinine, Ser: 1.22 mg/dL — ABNORMAL HIGH (ref 0.44–1.00)
GFR calc Af Amer: 51 mL/min — ABNORMAL LOW (ref >60)
GFR calc non Af Amer: 44 mL/min — ABNORMAL LOW (ref >60)
Glucose, Bld: 145 mg/dL — ABNORMAL HIGH (ref 70–99)
Potassium: 4.8 mmol/L (ref 3.5–5.1)
Sodium: 136 mmol/L (ref 135–145)

## 2019-12-11 MED ORDER — CLOPIDOGREL BISULFATE 75 MG PO TABS: 75.0000 mg | ORAL_TABLET | Freq: Every day | ORAL | 3 refills | Status: DC

## 2019-12-11 MED ORDER — CARVEDILOL 3.125 MG PO TABS
3.1250 mg | ORAL_TABLET | Freq: Two times a day (BID) | ORAL | 2 refills | Status: DC
Start: 2019-12-11 — End: 2020-09-05

## 2019-12-11 NOTE — Progress Notes (Signed)
Office Visit    Patient Name: Alexandria Marquez Date of Encounter: 12/11/2019  Primary Care Provider:  Patrice Paradise, MD Primary Cardiologist:  Lorine Bears, MD Electrophysiologist:  None   Chief Complaint    Alexandria Marquez is a 74 y.o. female with a hx of CAD s/p CABG X to at [facility] in 2008 s/p subsequent PCI/DES to RCA 09/2019, diastolic dysfunction, mild pulmonary hypertension, DM2, PAD s/p SMA stenting for suspected chronic mesenteric ischemia 05/2018, HTN, HLD, hypothyroidism, OSA, depression, anxiety, GERD presents today for follow up of CAD  Past Medical History    Past Medical History:  Diagnosis Date  . Anxiety   . Arthritis   . Asthma    when young  . Coronary artery disease   . Depression   . Diabetes mellitus without complication (HCC)   . Environmental and seasonal allergies   . GERD (gastroesophageal reflux disease)   . History of hiatal hernia   . Hyperlipidemia   . Hypertension   . Hypothyroid   . Hypothyroidism   . Lower extremity edema   . Motion sickness   . OSA (obstructive sleep apnea) 06/04/2014  . Osteoarthritis   . Scoliosis   . Shortness of breath dyspnea    doe   Past Surgical History:  Procedure Laterality Date  . ABDOMINAL HYSTERECTOMY    . APPENDECTOMY    . BACK SURGERY  2014   lumbar fusion/plate/rods/screws  . CARDIAC CATHETERIZATION    . CHOLECYSTECTOMY  2005  . COLONOSCOPY WITH PROPOFOL N/A 01/18/2017   Procedure: COLONOSCOPY WITH PROPOFOL;  Surgeon: Christena Deem, MD;  Location: [facility] ENDOSCOPY;  Service: Endoscopy;  Laterality: N/A;  . CORONARY ARTERY BYPASS GRAFT     2008 2 vessels  . CORONARY ARTERY BYPASS GRAFT  2008  . CORONARY STENT INTERVENTION  10/18/2019  . CORONARY STENT INTERVENTION N/A 10/18/2019   Procedure: CORONARY STENT INTERVENTION;  Surgeon: Iran Ouch, MD;  Location: [facility] INVASIVE CV LAB;  Service: Cardiovascular;  Laterality: N/A;  . ESOPHAGOGASTRODUODENOSCOPY (EGD) WITH PROPOFOL N/A 05/16/2018    Procedure: ESOPHAGOGASTRODUODENOSCOPY (EGD) WITH PROPOFOL;  Surgeon: Christena Deem, MD;  Location: [facility] ENDOSCOPY;  Service: Endoscopy;  Laterality: N/A;  . FRACTURE SURGERY    . HERNIA REPAIR  1987   incisional  . JOINT REPLACEMENT Right 2017   knee  . ORIF ANKLE FRACTURE Left 06/19/2016   Procedure: OPEN REDUCTION INTERNAL FIXATION (ORIF) ANKLE FRACTURE;  Surgeon: Juanell Fairly, MD;  Location: [facility] ORS;  Service: Orthopedics;  Laterality: Left;  . rcr    . RIGHT/LEFT HEART CATH AND CORONARY/GRAFT ANGIOGRAPHY N/A 10/18/2019   Procedure: RIGHT/LEFT HEART CATH AND CORONARY/GRAFT ANGIOGRAPHY;  Surgeon: Iran Ouch, MD;  Location: [facility] INVASIVE CV LAB;  Service: Cardiovascular;  Laterality: N/A;  . ROTATOR CUFF REPAIR Left 2013  . TONSILLECTOMY    . TOTAL KNEE ARTHROPLASTY Right 12/19/2015   Procedure: TOTAL KNEE ARTHROPLASTY;  Surgeon: Christena Flake, MD;  Location: [facility] ORS;  Service: Orthopedics;  Laterality: Right;  . TUBAL LIGATION    . VISCERAL ANGIOGRAPHY N/A 05/23/2018   Procedure: VISCERAL ANGIOGRAPHY;  Surgeon: Annice Needy, MD;  Location: [facility] INVASIVE CV LAB;  Service: Cardiovascular;  Laterality: N/A;    Allergies  Allergies  Allergen Reactions  . Simethicone Anaphylaxis and Hives  . Morphine And Related Nausea And Vomiting    Severe n & v. Any other pain medications are okay  . Penicillins Hives    Has patient had a  PCN reaction causing immediate rash, facial/tongue/throat swelling, SOB or lightheadedness with hypotension: No Has patient had a PCN reaction causing severe rash involving mucus membranes or skin necrosis: No Has patient had a PCN reaction that required hospitalization No Has patient had a PCN reaction occurring within the last 10 years: No If all of the above answers are "NO", then may proceed with Cephalosporin use.     History of Present Illness    Alexandria Marquez is a 74 y.o.Alexandria Marquez female with a hx of CAD s/p [facility] s/p subsequent  PCI/DES to RCA 09/2019, diastolic dysfunction, mild pulmonary hypertension, DM2, PAD s/p SMA stenting for suspected chronic mesenteric ischemia 05/2018, HTN, HLD, hypothyroidism, OSA, depression, anxiety, GERD last seen 10/27/2019  She was seen 07/2019 with progressive exertional dyspnea over preceding year that was occurring with minimal exertion.  Also noted with substernal chest tightness and heaviness.  Lexiscan MPI 08/25/2019 no significant ischemia with hyperdynamic LVEF greater than 65% and overall low risk today.  Echo 08/28/2019 with EF 60-65%, no regional wall motion normalities, mild LVH, grade 1 diastolic dysfunction, normal RV SF and RV cavity size, mildly elevated PASP at 43.9 mmHg, mild MR, mild to moderate TR, mild to moderate AV sclerosis without stenosis.  Seen in clinic 09/29/2019 with continued dyspnea and was started on Lasix 20 mg daily and her hydrochlorothiazide was stopped.  Did note significant secondhand smoke exposure history.    When seen in clinic 10/06/2019 reported continued dyspnea, fatigue, chest pressure.  She was recommended for cardiac catheterization which was performed 10/18/2019. Cath showed significant underlying 3-vessel CAD with patent LIMA-LAD, SVG-diagonal. SVG-second diagonal proviced competitive flow to LAD. LIMA small caliber with moderate diffuse disease in mid and distal segment. Mod stenosis in mid-distal LCx. Significant mid RCA stenosis which was treated with DESx2. RHC with normal filling pressures bilaterally, mild PAH. She was started on Coreg due to severe hypertension. Of note, her Lasix, Enalapril were stopped previously due to declining renal function. Labs on 10/19/19 showed her creatinine had improved to 1.07 and GFR to 51.   At last clinic she reported feeling overall well. Her Enalapril was added back due to hypertension. Her Atorvastatin was reduced due to diarrhea.   12/05/19 Labs via Care Everywhere:   WBC 10.9, platelets 328, hemoglobin 12.9, hematocrit  39.9.  A1c 7.1, TSH 8.19  K4.7, creatinine 1.2, GFR 44, AST 12, ALT 11  Reports feeling overall well since last seen.  She has been checking her blood pressure intermittently at home.  Tells me her BP last week 130/80. Reports no shortness of breath at rest.  Reports her dyspnea on exertion is stable at baseline. Reports no chest pain, pressure, or tightness. No edema, orthopnea, PND. Reports no palpitations. She reports intermittent sensation of dizziness with quick head position changes-likely vertigo versus inner ear.  She inquires about seeing ENT and encouraged her to discuss with her primary care provider.  She completed 3 cardiac rehab sessions but had hypoglycemia subsequently unenrolled herself.  She has seen her primary care provider regarding her diabetes.  She was encouraged to check her blood glucose regularly and eat regular meals.  At recent visit with her primary care provider she was prescribed bisoprolol.  She was previously on carvedilol.  Though does note that she ran out over Labor Day weekend.  Unclear why it was transitioned to bisoprolol and she has not started yet.  EKGs/Labs/Other Studies Reviewed:   The following studies were  reviewed today:  [facility] 10/18/19 1.  Significant underlying three-vessel coronary artery disease with patent LIMA to LAD and SVG to second diagonal.  The SVG to second diagonal provides competitive flow to the LAD.  The LIMA itself is of a small caliber with moderate diffuse disease in the mid and distal segment.  There is moderate stenosis in the mid to distal left circumflex which was not well visualized given that the left main could not be selectively engaged.  There was significant mid RCA stenosis in a large vessel.  The RCA was not bypassed. 2.  Normal LV systolic function. 3.  Right heart catheterization showed normal right and left-sided filling pressures, mild pulmonary hypertension normal cardiac output.  Severe systemic hypertension with  systolic blood pressure around 200. 4.  Successful drug-eluting stent placement to the mid right coronary artery.  A second overlapping stent was placed proximally due to proximal edge dissection.   Recommendations: Dual antiplatelet therapy for at least 1 year. Aggressive treatment of risk factors. Blood pressure control.  I elected to add carvedilol. Hydrate overnight given underlying chronic kidney disease.  150 mL of contrast was used.  EKG:  No EKG is ordered today.  The ekg independently reviewed from 10/27/19  demonstrates SB 54 bpm with incomplete RBBB and no acute ST/T wave changes.   Recent Labs: 10/19/2019: Hemoglobin 10.4; Platelets 246 11/06/2019: BUN 23; Creatinine, Ser 1.17; Potassium 4.6; Sodium 136  Recent Lipid Panel    Component Value Date/Time   CHOL 131 10/19/2019 0416   TRIG 120 10/19/2019 0416   HDL 37 (L) 10/19/2019 0416   CHOLHDL 3.5 10/19/2019 0416   VLDL 24 10/19/2019 0416   LDLCALC 70 10/19/2019 0416    Home Medications   Current Meds  Medication Sig  . bisoprolol (ZEBETA) 5 MG tablet Take by mouth.  . levothyroxine (SYNTHROID) 88 MCG tablet Take by mouth.    Review of Systems  All other systems reviewed and are otherwise negative except as noted above. Review of Systems  Constitutional: Positive for malaise/fatigue. Negative for chills and fever.  Cardiovascular: Negative for chest pain, dyspnea on exertion, leg swelling, near-syncope, orthopnea, palpitations and syncope.  Respiratory: Negative for cough, shortness of breath and wheezing.   Gastrointestinal: Negative for nausea and vomiting.  Neurological: Positive for dizziness. Negative for light-headedness and weakness.     Physical Exam   VS:  There were no vitals taken for this visit. , BMI There is no height or weight on file to calculate BMI. GEN: Well nourished, well developed, in no acute distress. HEENT: normal. Neck: Supple, no JVD, carotid bruits, or masses. Cardiac: RRR, no  murmurs, rubs, or gallops. No clubbing, cyanosis, edema.  Radials/DP/PT 2+ and equal bilaterally.  Respiratory:  Respirations regular and unlabored, clear to auscultation bilaterally. GI: Soft, nontender, nondistended, BS + x 4. MS: No deformity or atrophy. Skin: Warm and dry, no rash.   Neuro:  Strength and sensation are intact. Psych: Normal affect.  Assessment & Plan   1. CAD s/p CABG 2008 and s/p DESx2 to RCA 09/2019 - Stable with no anginal symptoms. GDMT includes DAPT aspirin/plavix for one year, Coreg, statin.  Regular cardiovascular exercise encouraged. As she has been out of Coreg for 3 week and has baseline bradycardia, resume Coreg at reduced dose of 3.125mg  BID. Plavix refill also provided.   2. HLD, LDL goal <70 -atorvastatin reduced to 40 mg daily clinic visit 10/27/2019 due to diarrhea.  Lipid panel today.  If LDL greater  than 70 consider up titration to 80 mg versus addition of Zetia.   3. HTN -BP well controlled at home.  Continue present antihypertensive regimen including enalapril 2.5 mg twice daily.  We will add Coreg 3.125 mg twice daily due to mildly elevated blood pressure in clinic today and need for beta-blocker in setting of coronary disease.  4. Mild pulmonary hypertension - By cath 09/2019. Defer re-initiation of Lasix at this time as she is well compensated.    5. DM2 - Continue to follow with PCP. If continued difficulties with creatinine/GFR may need to consider alternate agent to Metformin  6. Diastolic dysfunction - Euvolemic and well compensated. No indication for loop diuretic at this time. Continue Coreg.  7. PAD - Continue aspirin, statin. Continue to follow with vascular. No signs of worsening claudication.  8. OSA - CPAP compliance encouraged.  9. Depression -following with her primary care provider.  She is on max dose Zoloft.  She was recently referred to psychiatry for further assistance with medications.  Agree with this referral.  Anticipate some of  the fatigue she reports is likely due to depression.  Particularly she has been off her beta-blocker for 3 weeks with no improvement.  Disposition: Follow up in 3 month(s) with Dr. Kirke Corin or APP with lipid panel at that time.  Alver Sorrow, NP 12/11/2019, 1:17 PM

## 2019-12-11 NOTE — Patient Instructions (Addendum)
Medication Instructions:  Your physician has recommended you make the following change in your medication:   We have sent a refill of your Plavix to your pharmacy  RESUME your Carvedilol (Coreg) 3.125mg  twice daily  *If you need a refill on your cardiac medications before your next appointment, please call your pharmacy*  Lab Work: Your physician recommends that you have lab work today: direct LDL  Medical Mall entrance at [facility] Registration desk to check in  If you have labs (blood work) drawn today and your tests are completely normal, you will receive your results only by: Marland Kitchen MyChart Message (if you have MyChart) OR . A paper copy in the mail If you have any lab test that is abnormal or we need to change your treatment, we will call you to review the results.  Testing/Procedures: None ordered today.   Follow-Up: At [facility], you and your health needs are our priority.  As part of our continuing mission to provide you with exceptional heart care, we have created designated Provider Care Teams.  These Care Teams include your primary Cardiologist (physician) and Advanced Practice Providers (APPs -  Physician Assistants and Nurse Practitioners) who all work together to provide you with the care you need, when you need it.  We recommend signing up for the patient portal called "MyChart".  Sign up information is provided on this After Visit Summary.  MyChart is used to connect with patients for Virtual Visits (Telemedicine).  Patients are able to view lab/test results, encounter notes, upcoming appointments, etc.  Non-urgent messages can be sent to your provider as well.   To learn more about what you can do with MyChart, go to [email or web address].    Your next appointment:   3 month(s)  The format for your next appointment:   In Person  Provider:   You may see Lorine Bears, MD or one of the following Advanced Practice Providers on your designated Care Team:     Nicolasa Ducking, NP  Eula Listen, PA-C  Gillian Shields, NP  Marisue Ivan, PA-C  Cadence Fransico Michael, [place]  Other Instructions  Continue heart healthy, low sodium diet.  Recommend a walking regimen.

## 2019-12-12 ENCOUNTER — Telehealth: Payer: Self-pay

## 2019-12-12 DIAGNOSIS — I25118 Atherosclerotic heart disease of native coronary artery with other forms of angina pectoris: Secondary | ICD-10-CM

## 2019-12-12 LAB — LDL CHOLESTEROL, DIRECT: Direct LDL: 75 mg/dL (ref 0–99)

## 2019-12-12 MED ORDER — ATORVASTATIN CALCIUM 40 MG PO TABS: 80.0000 mg | ORAL_TABLET | Freq: Every day | ORAL | 6 refills | Status: DC

## 2019-12-12 NOTE — Telephone Encounter (Signed)
Call to patient to review labs.    Pt verbalized understanding and has no further questions at this time. she reported she does not feel diarrhea was related to atorvastatin and is willing to try increase.    Advised pt to call for any further questions or concerns.  Orders placed for repeat fasting labs in 6 weeks at the medical mall.

## 2019-12-12 NOTE — Telephone Encounter (Signed)
-----   Message from Alver Sorrow, NP sent at 12/12/2019  8:59 AM EDT ----- LDL of 75 with goal LDL less than 70. She previously had some mild diarrhea with Atorvstatin 80mg . If she wishes to try high dose Atorvastatin again, she can as in clinic she wasn't sure whether the Atorvastatin was truly causing the diarrhea. Otherwise, recommend addition of Zetia 10mg  daily. Both are good options! Repeat lipid/liver in 6 weeks.

## 2020-03-14 ENCOUNTER — Other Ambulatory Visit: Payer: Self-pay | Admitting: Physician Assistant

## 2020-03-14 DIAGNOSIS — Z1231 Encounter for screening mammogram for malignant neoplasm of breast: Secondary | ICD-10-CM

## 2020-03-29 ENCOUNTER — Ambulatory Visit (INDEPENDENT_AMBULATORY_CARE_PROVIDER_SITE_OTHER): Payer: Medicare Other | Admitting: Nurse Practitioner

## 2020-03-29 ENCOUNTER — Other Ambulatory Visit: Payer: Self-pay

## 2020-03-29 ENCOUNTER — Encounter: Payer: Self-pay | Admitting: Nurse Practitioner

## 2020-03-29 ENCOUNTER — Other Ambulatory Visit
Admission: RE | Admit: 2020-03-29 | Discharge: 2020-03-29 | Disposition: A | Discharge status: Home / Self Care | Payer: Medicare Other | Source: Ambulatory Visit | Attending: Nurse Practitioner | Admitting: Nurse Practitioner

## 2020-03-29 VITALS — BP 120/68 | HR 59 | Ht 60.0 in | Wt 169.0 lb

## 2020-03-29 DIAGNOSIS — I10 Essential (primary) hypertension: Secondary | ICD-10-CM | POA: Insufficient documentation

## 2020-03-29 DIAGNOSIS — E785 Hyperlipidemia, unspecified: Secondary | ICD-10-CM | POA: Diagnosis not present

## 2020-03-29 DIAGNOSIS — I25119 Atherosclerotic heart disease of native coronary artery with unspecified angina pectoris: Secondary | ICD-10-CM | POA: Diagnosis not present

## 2020-03-29 DIAGNOSIS — R5383 Other fatigue: Secondary | ICD-10-CM | POA: Diagnosis not present

## 2020-03-29 DIAGNOSIS — I25118 Atherosclerotic heart disease of native coronary artery with other forms of angina pectoris: Secondary | ICD-10-CM | POA: Diagnosis present

## 2020-03-29 LAB — BASIC METABOLIC PANEL
Anion gap: 11 (ref 5–15)
BUN: 26 mg/dL — ABNORMAL HIGH (ref 8–23)
CO2: 24 mmol/L (ref 22–32)
Calcium: 9.4 mg/dL (ref 8.9–10.3)
Chloride: 101 mmol/L (ref 98–111)
Creatinine, Ser: 1.56 mg/dL — ABNORMAL HIGH (ref 0.44–1.00)
GFR, Estimated: 35 mL/min — ABNORMAL LOW (ref >60)
Glucose, Bld: 126 mg/dL — ABNORMAL HIGH (ref 70–99)
Potassium: 4.8 mmol/L (ref 3.5–5.1)
Sodium: 136 mmol/L (ref 135–145)

## 2020-03-29 LAB — CBC
HCT: 38.5 % (ref 36.0–46.0)
Hemoglobin: 12.7 g/dL (ref 12.0–15.0)
MCH: 30 pg (ref 26.0–34.0)
MCHC: 33 g/dL (ref 30.0–36.0)
MCV: 91 fL (ref 80.0–100.0)
Platelets: 318 10*3/uL (ref 150–400)
RBC: 4.23 MIL/uL (ref 3.87–5.11)
RDW: 13.3 % (ref 11.5–15.5)
WBC: 10.5 10*3/uL (ref 4.0–10.5)
nRBC: 0 % (ref 0.0–0.2)

## 2020-03-29 LAB — TSH: TSH: 5.157 u[IU]/mL — ABNORMAL HIGH (ref 0.350–4.500)

## 2020-03-29 NOTE — Patient Instructions (Signed)
Medication Instructions:   1.  Stop taking your Carvedilol (Coreg)  *If you need a refill on your cardiac medications before your next appointment, please call your pharmacy*   Lab Work: TSH,  CBC, BMP to be drawn today.  If you have labs (blood work) drawn today and your tests are completely normal, you will receive your results only by: Marland Kitchen MyChart Message (if you have MyChart) OR . A paper copy in the mail If you have any lab test that is abnormal or we need to change your treatment, we will call you to review the results.   Testing/Procedures:  None Ordered   Follow-Up: At [facility], you and your health needs are our priority.  As part of our continuing mission to provide you with exceptional heart care, we have created designated Provider Care Teams.  These Care Teams include your primary Cardiologist (physician) and Advanced Practice Providers (APPs -  Physician Assistants and Nurse Practitioners) who all work together to provide you with the care you need, when you need it.  We recommend signing up for the patient portal called "MyChart".  Sign up information is provided on this After Visit Summary.  MyChart is used to connect with patients for Virtual Visits (Telemedicine).  Patients are able to view lab/test results, encounter notes, upcoming appointments, etc.  Non-urgent messages can be sent to your provider as well.   To learn more about what you can do with MyChart, go to [email or web address].    Your next appointment:   2 week(s)  The format for your next appointment:   In Person  Provider:   You may see Lorine Bears, MD or one of the following Advanced Practice Providers on your designated Care Team:    Nicolasa Ducking, NP    Gillian Shields, NP    Other Instructions

## 2020-03-29 NOTE — Progress Notes (Signed)
Office Visit    Patient Name: Alexandria Marquez Date of Encounter: 03/29/2020  Primary Care Provider:  Patrice Paradise, MD Primary Cardiologist:  Lorine Bears, MD  Chief Complaint    75 year old female with a history of CAD status post CABG and subsequent RCA stenting, diastolic dysfunction, mild pulmonary hypertension, diabetes, peripheral arterial disease status post SMA stenting (March 2020), hypertension, hyperlipidemia, hypothyroidism, sleep apnea, depression, anxiety, and GERD, who presents for follow-up related to CAD and fatigue.  Past Medical History    Past Medical History:  Diagnosis Date  . Anxiety   . Arthritis   . Asthma    when young  . Coronary artery disease    a. 2008 s/p CABG x 2 (LIMA->LAD, VG->Diag); b. 09/2019 PCI: LM min irregs, LAD 100p/m, 85/39m, LCX 3m/d, RCA 84m (2.0x18 Resolute Onyx DES), VG->D2 ok, LIMA->LAD 50p/d. EF 55-65%.  . Depression   . Diabetes mellitus without complication (HCC)   . Diastolic dysfunction    a. 08/2019 Echo: EF 60-65%, no rwma, mild LVH, Gr1 DD. RVSP 43.11mmHg. Mild MR, mild to mod TR. AoV sclerosis.  . Environmental and seasonal allergies   . GERD (gastroesophageal reflux disease)   . History of hiatal hernia   . Hyperlipidemia   . Hypertension   . Hypothyroid   . Hypothyroidism   . Lower extremity edema   . Motion sickness   . OSA (obstructive sleep apnea) 06/04/2014  . Osteoarthritis   . Scoliosis   . Shortness of breath dyspnea    doe   Past Surgical History:  Procedure Laterality Date  . ABDOMINAL HYSTERECTOMY    . APPENDECTOMY    . BACK SURGERY  2014   lumbar fusion/plate/rods/screws  . CARDIAC CATHETERIZATION    . CHOLECYSTECTOMY  2005  . COLONOSCOPY WITH PROPOFOL N/A 01/18/2017   Procedure: COLONOSCOPY WITH PROPOFOL;  Surgeon: Christena Deem, MD;  Location: [facility] ENDOSCOPY;  Service: Endoscopy;  Laterality: N/A;  . CORONARY ARTERY BYPASS GRAFT     2008 2 vessels  . CORONARY ARTERY BYPASS GRAFT   2008  . CORONARY STENT INTERVENTION  10/18/2019  . CORONARY STENT INTERVENTION N/A 10/18/2019   Procedure: CORONARY STENT INTERVENTION;  Surgeon: Iran Ouch, MD;  Location: [facility] INVASIVE CV LAB;  Service: Cardiovascular;  Laterality: N/A;  . ESOPHAGOGASTRODUODENOSCOPY (EGD) WITH PROPOFOL N/A 05/16/2018   Procedure: ESOPHAGOGASTRODUODENOSCOPY (EGD) WITH PROPOFOL;  Surgeon: Christena Deem, MD;  Location: [facility] ENDOSCOPY;  Service: Endoscopy;  Laterality: N/A;  . FRACTURE SURGERY    . HERNIA REPAIR  1987   incisional  . JOINT REPLACEMENT Right 2017   knee  . ORIF ANKLE FRACTURE Left 06/19/2016   Procedure: OPEN REDUCTION INTERNAL FIXATION (ORIF) ANKLE FRACTURE;  Surgeon: Juanell Fairly, MD;  Location: [facility] ORS;  Service: Orthopedics;  Laterality: Left;  . rcr    . RIGHT/LEFT HEART CATH AND CORONARY/GRAFT ANGIOGRAPHY N/A 10/18/2019   Procedure: RIGHT/LEFT HEART CATH AND CORONARY/GRAFT ANGIOGRAPHY;  Surgeon: Iran Ouch, MD;  Location: [facility] INV[facility] CV LAB;  Service: Cardiovascular;  Laterality: N/A;  . ROTATOR CUFF REPAIR Left 2013  . TONSILLECTOMY    . TOTAL KNEE ARTHROPLASTY Right 12/19/2015   Procedure: TOTAL KNEE ARTHROPLASTY;  Surgeon: Christena Flake, MD;  Location: [facility] ORS;  Service: Orthopedics;  Laterality: Right;  . TUBAL LIGATION    . VISCERAL ANGIOGRAPHY N/A 05/23/2018   Procedure: VISCERAL ANGIOGRAPHY;  Surgeon: Annice Needy, MD;  Location: [facility] INVASIVE CV LAB;  Service: Cardiovascular;  Laterality: N/A;  Allergies  Allergies  Allergen Reactions  . Simethicone Anaphylaxis and Hives  . Morphine And Related Nausea And Vomiting    Severe n & v. Any other pain medications are okay  . Penicillins Hives    Has patient had a PCN reaction causing immediate rash, facial/tongue/throat swelling, SOB or lightheadedness with hypotension: No Has patient had a PCN reaction causing severe rash involving mucus membranes or skin necrosis: No Has patient had a PCN reaction that  required hospitalization No Has patient had a PCN reaction occurring within the last 10 years: No If all of the above answers are "NO", then may proceed with Cephalosporin use.     History of Present Illness    75 year old female with the above past medical history including CAD, diastolic dysfunction, mild pulmonary hypertension, diabetes, peripheral arterial disease status post SMA stenting (March 2020), hypertension, hyperlipidemia, hypothyroidism, sleep apnea, depression, anxiety, and GERD.  She underwent CABG x2 in 2008 at [facility].  In the setting of mesenteric ischemia, she underwent stenting of the superior mesenteric artery in March 2020 with improvement in abdominal symptoms.  In May 2021, she noted progressive exertional dyspnea and chest pain.  She underwent stress testing which was nonischemic.  Echocardiogram showed normal LV function with mild pulmonary hypertension.  She continued to have chest pain and dyspnea and subsequently underwent diagnostic catheterization revealing 2 of 2 patent grafts with new RCA stenosis which was treated with a drug-eluting stent.  She was last seen in clinic in [place] 2021, at which time she was feeling well.  Since then however, she has noted low energy and fatigue.  She has some dyspnea on exertion which is overall been stable but notes that she is relatively sedentary.  She occasionally notes mild pressure in her upper chest, typically occurring at rest, lasting a minute or so, and resolving spontaneously.  She does not think this is similar to prior angina and has not been having any exertional chest pain.  In all, she feels much better compared to prior to her stenting but is bothered by ongoing fatigue and wonders if this may be secondary to carvedilol.  She has sometimes noted heart rates in the 40s and low 50s at home.  Heart rate is 59 today.  She denies palpitations, PND, orthopnea, dizziness, syncope, edema, or early satiety.  Home Medications     Prior to Admission medications   Medication Sig Start Date End Date Taking? Authorizing Provider  amLODipine (NORVASC) 5 MG tablet Take 1 tablet (5 mg total) by mouth 2 (two) times daily. 10/19/19  Yes Duke, Tami Lin, PA  aspirin 81 MG tablet Take 1 tablet (81 mg total) by mouth daily. 10/05/16  Yes Gladstone Lighter, MD  atorvastatin (LIPITOR) 80 MG tablet Take 80 mg by mouth daily. 12/12/19  Yes [provider]  carvedilol (COREG) 3.125 MG tablet Take 1 tablet (3.125 mg total) by mouth 2 (two) times daily. 12/11/19 06/08/20 Yes Loel Dubonnet, NP  clopidogrel (PLAVIX) 75 MG tablet Take 1 tablet (75 mg total) by mouth daily with breakfast. 12/11/19  Yes Loel Dubonnet, NP  enalapril (VASOTEC) 2.5 MG tablet Take 1 tablet (2.5 mg total) by mouth 2 (two) times daily. 10/27/19  Yes Loel Dubonnet, NP  fluticasone (FLONASE) 50 MCG/ACT nasal spray Place 1-2 sprays into both nostrils daily as needed for allergies.    Yes [provider]  gabapentin (NEURONTIN) 300 MG capsule Take 600 mg by mouth at bedtime.   Yes [provider]  glimepiride (AMARYL) 2 MG tablet Take 2 mg by mouth daily with breakfast.   Yes [provider]  glucose blood (ONETOUCH ULTRA) test strip USE 2 (TWO) TIMES DAILY. USE AS INSTRUCTED. 07/20/17  Yes [provider]  insulin detemir (LEVEMIR) 100 UNIT/ML injection Inject 27 Units into the skin daily.    Yes [provider]  levothyroxine (SYNTHROID) 88 MCG tablet Take by mouth. 12/07/19 12/06/20 Yes [provider]  metFORMIN (GLUCOPHAGE) 1000 MG tablet Take 1 tablet (1,000 mg total) by mouth 2 (two) times daily with a meal. Resume on 10/21/19. 10/19/19  Yes Duke, Roe Rutherford, PA  Misc Natural Products (APPLE CIDER VINEGAR DIET PO) Take 1 each by mouth in the morning and at bedtime. Golli Apple Cider   Yes [provider]  nitroGLYCERIN (NITROSTAT) 0.4 MG SL tablet Place 1 tablet (0.4 mg total) under the  tongue every 5 (five) minutes as needed for chest pain. 10/19/19 10/18/20 Yes Duke, Roe Rutherford, PA  ondansetron (ZOFRAN-ODT) 4 MG disintegrating tablet Take 4 mg by mouth every 8 (eight) hours as needed for nausea or vomiting.   Yes [provider]  pantoprazole (PROTONIX) 40 MG tablet Take 40 mg by mouth daily.    Yes [provider]  Polyethyl Glycol-Propyl Glycol 0.4-0.3 % SOLN Place 1 drop into both eyes 3 (three) times daily as needed (for dry eyes).   Yes [provider]  sertraline (ZOLOFT) 100 MG tablet Take 100 mg by mouth daily.   Yes [provider]  tiZANidine (ZANAFLEX) 2 MG tablet Take 2 mg by mouth every 6 (six) hours as needed for muscle spasms.   Yes [provider]  VITAMIN D PO Take 1 tablet by mouth daily.    Yes [provider]    Review of Systems    Fatigue with some degree of chronic, stable dyspnea on exertion.  Occasional upper chest discomfort not similar to prior angina.  She denies palpitations, PND, orthopnea, dizziness, syncope, edema, or early satiety.  All other systems reviewed and are otherwise negative except as noted above.  Physical Exam    VS:  BP 120/68 (BP Location: Left Arm, Patient Position: Sitting, Cuff Size: Normal)   Pulse (!) 59   Ht 5' (1.524 m)   Wt 169 lb (76.7 kg)   SpO2 98%   BMI 33.01 kg/m  , BMI Body mass index is 33.01 kg/m. GEN: Well nourished, well developed, in no acute distress. HEENT: normal. Neck: Supple, no JVD, carotid bruits, or masses. Cardiac: RRR, 2/6 systolic murmur at the upper sternal borders, no rubs, or gallops. No clubbing, cyanosis, edema.  Radials/DP/PT 2+ and equal bilaterally.  Respiratory:  Respirations regular and unlabored, clear to auscultation bilaterally. GI: Soft, nontender, nondistended, BS + x 4. MS: no deformity or atrophy. Skin: warm and dry, no rash. Neuro:  Strength and sensation are intact. Psych: Normal affect.  Accessory Clinical  Findings    ECG personally reviewed by me today -sinus bradycardia, 59, PVC, leftward axis, anterolateral - no acute changes.  Lab Results  Component Value Date   WBC 10.5 03/29/2020   HGB 12.7 03/29/2020   HCT 38.5 03/29/2020   MCV 91.0 03/29/2020   PLT 318 03/29/2020   Lab Results  Component Value Date   CREATININE 1.56 (H) 03/29/2020   BUN 26 (H) 03/29/2020   NA 136 03/29/2020   K 4.8 03/29/2020   CL 101 03/29/2020   CO2 24 03/29/2020  Lab Results  Component Value Date   ALT 21 10/01/2016   AST 29 10/01/2016   ALKPHOS 83 10/01/2016   BILITOT 1.0 10/01/2016   Lab Results  Component Value Date   CHOL 131 10/19/2019   HDL 37 (L) 10/19/2019   LDLCALC 70 10/19/2019   LDLDIRECT 75.0 12/11/2019   TRIG 120 10/19/2019   CHOLHDL 3.5 10/19/2019    Lab Results  Component Value Date   HGBA1C 7.2 (H) 06/02/2016    Assessment & Plan    1. Coronary artery disease: Status post CABG x2 in 2008 with drug-eluting stent placement to the RCA in July 2021.  She has chronic, stable dyspnea on exertion.  She has had an occasional episode of upper chest discomfort that occurs at rest, lasts a minute or 2, and resolve spontaneously.  She does not think that this is similar to prior angina and overall feels much better than how she felt prior to stenting.  She has bothered by fatigue and would like to try coming off of beta-blocker therapy to see if this makes a difference.  I was agreeable to this given bradycardia today with more profound bradycardia noted at home with rates in the 40s.  She will otherwise remain on aspirin, statin, Plavix, ACE inhibitor.  2.  Essential hypertension: Stable.  Advised to watch blood pressure at home with weaning and discontinuation of carvedilol.  3.  Hyperlipidemia: LDL was 70 in July.  She remains on atorvastatin therapy.  4.  HFpEF: Euvolemic on examination.  She is chronic, stable dyspnea on exertion.  Heart rate and blood pressure well  controlled.  5.  Mesenteric ischemia/PAD: Status post superior mesenteric artery stenting.  Stable.  She follows with vascular surgery.  6.  Fatigue: As above, has been fatigued since about [place], possibly before.  She would like to come off of beta-blocker to see if this improves fatigue and we agreed to this.  She will wean off of it over the next 3 days and reevaluate her in about 2 weeks.  7.  Obstructive sleep apnea: CPAP compliance encouraged.  8.  Type 2 diabetes mellitus: Followed by primary care and on metformin and Levemir.  9.  Disposition: Follow-up in approximately 2 weeks.  I will check a CBC, TSH, and basic metabolic panel today.  Nicolasa Ducking, NP 03/29/2020, 5:40 PM

## 2020-04-09 ENCOUNTER — Telehealth: Payer: Self-pay | Admitting: Nurse Practitioner

## 2020-04-09 NOTE — Telephone Encounter (Signed)
Spoke with patient and she states that she had not been taking her thyroid medication and now she has been taking that and the carvedilol. She states she has been feeling much better but encouraged her to please keep or reschedule appointment so provider can review all of this with her. She states that [facility] and it can be difficult for her to get in due to this. She wants to call back and reschedule later next month to allow her to time to make sure there are no conflicts with schedules. Reviewed importance of follow up with providers to monitor her care closely. She acknowledged this and promised to call back when possible to reschedule appointment. Will update provider as well.

## 2020-04-09 NOTE — Telephone Encounter (Signed)
Patient calling in to report she started taking her carvedilol and is feeling much better. Patient cancelled upcoming fu and wants to speak with nurse before rescheduling

## 2020-04-11 ENCOUNTER — Ambulatory Visit: Payer: Medicare Other | Admitting: Nurse Practitioner

## 2020-04-26 ENCOUNTER — Other Ambulatory Visit: Payer: Self-pay | Admitting: Family

## 2020-08-13 ENCOUNTER — Encounter: Payer: Self-pay | Admitting: Ophthalmology

## 2020-08-18 ENCOUNTER — Emergency Department
Admission: EM | Admit: 2020-08-18 | Discharge: 2020-08-18 | Disposition: A | Discharge status: Home / Self Care | Payer: No Typology Code available for payment source | Attending: Emergency Medicine | Admitting: Emergency Medicine

## 2020-08-18 ENCOUNTER — Emergency Department: Payer: No Typology Code available for payment source

## 2020-08-18 ENCOUNTER — Encounter: Payer: Self-pay | Admitting: Radiology

## 2020-08-18 DIAGNOSIS — Y92410 Unspecified street and highway as the place of occurrence of the external cause: Secondary | ICD-10-CM | POA: Diagnosis not present

## 2020-08-18 DIAGNOSIS — I6782 Cerebral ischemia: Secondary | ICD-10-CM | POA: Diagnosis not present

## 2020-08-18 DIAGNOSIS — I110 Hypertensive heart disease with heart failure: Secondary | ICD-10-CM | POA: Insufficient documentation

## 2020-08-18 DIAGNOSIS — I5030 Unspecified diastolic (congestive) heart failure: Secondary | ICD-10-CM | POA: Insufficient documentation

## 2020-08-18 DIAGNOSIS — E039 Hypothyroidism, unspecified: Secondary | ICD-10-CM | POA: Insufficient documentation

## 2020-08-18 DIAGNOSIS — S8010XA Contusion of unspecified lower leg, initial encounter: Secondary | ICD-10-CM

## 2020-08-18 DIAGNOSIS — Z951 Presence of aortocoronary bypass graft: Secondary | ICD-10-CM | POA: Diagnosis not present

## 2020-08-18 DIAGNOSIS — I2510 Atherosclerotic heart disease of native coronary artery without angina pectoris: Secondary | ICD-10-CM | POA: Insufficient documentation

## 2020-08-18 DIAGNOSIS — Z794 Long term (current) use of insulin: Secondary | ICD-10-CM | POA: Insufficient documentation

## 2020-08-18 DIAGNOSIS — Z96651 Presence of right artificial knee joint: Secondary | ICD-10-CM | POA: Diagnosis not present

## 2020-08-18 DIAGNOSIS — Z7984 Long term (current) use of oral hypoglycemic drugs: Secondary | ICD-10-CM | POA: Insufficient documentation

## 2020-08-18 DIAGNOSIS — Z7982 Long term (current) use of aspirin: Secondary | ICD-10-CM | POA: Insufficient documentation

## 2020-08-18 DIAGNOSIS — Z79899 Other long term (current) drug therapy: Secondary | ICD-10-CM | POA: Diagnosis not present

## 2020-08-18 DIAGNOSIS — S20211A Contusion of right front wall of thorax, initial encounter: Secondary | ICD-10-CM | POA: Insufficient documentation

## 2020-08-18 DIAGNOSIS — S299XXA Unspecified injury of thorax, initial encounter: Secondary | ICD-10-CM | POA: Diagnosis present

## 2020-08-18 DIAGNOSIS — J45909 Unspecified asthma, uncomplicated: Secondary | ICD-10-CM | POA: Diagnosis not present

## 2020-08-18 DIAGNOSIS — E119 Type 2 diabetes mellitus without complications: Secondary | ICD-10-CM | POA: Insufficient documentation

## 2020-08-18 LAB — COMPREHENSIVE METABOLIC PANEL
ALT: 12 U/L (ref 0–44)
AST: 20 U/L (ref 15–41)
Albumin: 4.2 g/dL (ref 3.5–5.0)
Alkaline Phosphatase: 64 U/L (ref 38–126)
Anion gap: 12 (ref 5–15)
BUN: 25 mg/dL — ABNORMAL HIGH (ref 8–23)
CO2: 21 mmol/L — ABNORMAL LOW (ref 22–32)
Calcium: 9.3 mg/dL (ref 8.9–10.3)
Chloride: 100 mmol/L (ref 98–111)
Creatinine, Ser: 1.41 mg/dL — ABNORMAL HIGH (ref 0.44–1.00)
GFR, Estimated: 39 mL/min — ABNORMAL LOW (ref >60)
Glucose, Bld: 280 mg/dL — ABNORMAL HIGH (ref 70–99)
Potassium: 5 mmol/L (ref 3.5–5.1)
Sodium: 133 mmol/L — ABNORMAL LOW (ref 135–145)
Total Bilirubin: 0.7 mg/dL (ref 0.3–1.2)
Total Protein: 7.1 g/dL (ref 6.5–8.1)

## 2020-08-18 LAB — CBC
HCT: 35.9 % — ABNORMAL LOW (ref 36.0–46.0)
Hemoglobin: 11.9 g/dL — ABNORMAL LOW (ref 12.0–15.0)
MCH: 30.1 pg (ref 26.0–34.0)
MCHC: 33.1 g/dL (ref 30.0–36.0)
MCV: 90.7 fL (ref 80.0–100.0)
Platelets: 306 10*3/uL (ref 150–400)
RBC: 3.96 MIL/uL (ref 3.87–5.11)
RDW: 13 % (ref 11.5–15.5)
WBC: 12.5 10*3/uL — ABNORMAL HIGH (ref 4.0–10.5)
nRBC: 0 % (ref 0.0–0.2)

## 2020-08-18 MED ORDER — IOHEXOL 300 MG/ML  SOLN
60.0000 mL | Freq: Once | INTRAMUSCULAR | Status: AC | PRN
  Administered 2020-08-18: 60 mL via INTRAVENOUS
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
## 2020-08-18 NOTE — ED Notes (Signed)
Pt has been provided with discharge instructions. Pt denies any questions or concerns at this time. Pt verbalizes understanding for follow up care and d/c.  VSS.  Pt left department with all belongings.  

## 2020-08-20 ENCOUNTER — Ambulatory Visit: Payer: Medicare Other | Admitting: Family

## 2020-08-20 NOTE — Progress Notes (Deleted)
Office Visit    Patient Name: Alexandria Marquez Date of Encounter: 08/20/2020  Primary Care Provider:  Patrice Paradise, MD Primary Cardiologist:  Lorine Bears, MD Electrophysiologist:  None   Chief Complaint    Alexandria Marquez is a 75 y.o. female with a hx of CAD s/p CABG X to at [facility] in 2008 s/p subsequent PCI/DES to RCA 09/2019, diastolic dysfunction, mild pulmonary hypertension, DM2, PAD s/p SMA stenting for suspected chronic mesenteric ischemia 05/2018, HTN, HLD, hypothyroidism, OSA, depression, anxiety, GERD presents today for follow up of CAD  Past Medical History    Past Medical History:  Diagnosis Date  . Anxiety   . Arthritis   . Asthma    when young  . Coronary artery disease    a. 2008 s/p CABG x 2 (LIMA->LAD, VG->Diag); b. 09/2019 PCI: LM min irregs, LAD 100p/m, 85/67m, LCX 39m/d, RCA 20m (2.0x18 Resolute Onyx DES), VG->D2 ok, LIMA->LAD 50p/d. EF 55-65%.  . Depression   . Diabetes mellitus without complication (HCC)   . Diastolic dysfunction    a. 08/2019 Echo: EF 60-65%, no rwma, mild LVH, Gr1 DD. RVSP 43.42mmHg. Mild MR, mild to mod TR. AoV sclerosis.  . Environmental and seasonal allergies   . GERD (gastroesophageal reflux disease)   . History of hiatal hernia   . Hyperlipidemia   . Hypertension   . Hypothyroid   . Hypothyroidism   . Lower extremity edema   . Motion sickness   . OSA (obstructive sleep apnea) 06/04/2014  . Osteoarthritis   . Scoliosis   . Shortness of breath dyspnea    doe  . Vertigo    Past Surgical History:  Procedure Laterality Date  . ABDOMINAL HYSTERECTOMY    . APPENDECTOMY    . BACK SURGERY  2014   lumbar fusion/plate/rods/screws  . CARDIAC CATHETERIZATION    . CHOLECYSTECTOMY  2005  . COLONOSCOPY WITH PROPOFOL N/A 01/18/2017   Procedure: COLONOSCOPY WITH PROPOFOL;  Surgeon: Christena Deem, MD;  Location: [facility] ENDOSCOPY;  Service: Endoscopy;  Laterality: N/A;  . CORONARY ARTERY BYPASS GRAFT     2008 2 vessels  . CORONARY  ARTERY BYPASS GRAFT  2008  . CORONARY STENT INTERVENTION  10/18/2019  . CORONARY STENT INTERVENTION N/A 10/18/2019   Procedure: CORONARY STENT INTERVENTION;  Surgeon: Iran Ouch, MD;  Location: [facility] INVASIVE CV LAB;  Service: Cardiovascular;  Laterality: N/A;  . ESOPHAGOGASTRODUODENOSCOPY (EGD) WITH PROPOFOL N/A 05/16/2018   Procedure: ESOPHAGOGASTRODUODENOSCOPY (EGD) WITH PROPOFOL;  Surgeon: Christena Deem, MD;  Location: [facility] ENDOSCOPY;  Service: Endoscopy;  Laterality: N/A;  . FRACTURE SURGERY    . HERNIA REPAIR  1987   incisional  . JOINT REPLACEMENT Right 2017   knee  . ORIF ANKLE FRACTURE Left 06/19/2016   Procedure: OPEN REDUCTION INTERNAL FIXATION (ORIF) ANKLE FRACTURE;  Surgeon: Juanell Fairly, MD;  Location: [facility] ORS;  Service: Orthopedics;  Laterality: Left;  . rcr    . RIGHT/LEFT HEART CATH AND CORONARY/GRAFT ANGIOGRAPHY N/A 10/18/2019   Procedure: RIGHT/LEFT HEART CATH AND CORONARY/GRAFT ANGIOGRAPHY;  Surgeon: Iran Ouch, MD;  Location: [facility] INVASIVE CV LAB;  Service: Cardiovascular;  Laterality: N/A;  . ROTATOR CUFF REPAIR Left 2013  . TONSILLECTOMY    . TOTAL KNEE ARTHROPLASTY Right 12/19/2015   Procedure: TOTAL KNEE ARTHROPLASTY;  Surgeon: Christena Flake, MD;  Location: [facility] ORS;  Service: Orthopedics;  Laterality: Right;  . TUBAL LIGATION    . VISCERAL ANGIOGRAPHY N/A 05/23/2018   Procedure: VISCERAL ANGIOGRAPHY;  Surgeon: Annice Needy, MD;  Location: [facility] INVASIVE CV LAB;  Service: Cardiovascular;  Laterality: N/A;    Allergies  Allergies  Allergen Reactions  . Simethicone Anaphylaxis and Hives  . Morphine And Related Nausea And Vomiting    Severe n & v. Any other pain medications are okay  . Penicillins Hives    Has patient had a PCN reaction causing immediate rash, facial/tongue/throat swelling, SOB or lightheadedness with hypotension: No Has patient had a PCN reaction causing severe rash involving mucus membranes or skin necrosis: No Has patient had a PCN  reaction that required hospitalization No Has patient had a PCN reaction occurring within the last 10 years: No If all of the above answers are "NO", then may proceed with Cephalosporin use.     History of Present Illness    Alexandria Marquez is a 75 y.o. female with a hx of CAD s/p CABG X to at [facility] in 2008 s/p subsequent PCI/DES to RCA 09/2019, diastolic dysfunction, mild pulmonary hypertension, DM2, PAD s/p SMA stenting for suspected chronic mesenteric ischemia 05/2018, HTN, HLD, hypothyroidism, OSA, depression, anxiety, GERD last seen 03/29/20.  May 2021 noted progressive exertional dyspnea and substernal chest tightness.   Lexiscan MPI 08/25/2019 no significant ischemia with hyperdynamic LVEF greater than 65% and overall low risk today.  Echo 08/28/2019 with EF 60-65%, no regional wall motion normalities, mild LVH, grade 1 diastolic dysfunction, normal RV SF and RV cavity size, mildly elevated PASP at 43.9 mmHg, mild MR, mild to moderate TR, mild to moderate AV sclerosis without stenosis.  She was started on Lasix 20 mg daily and her hydrochlorothiazide was stopped.  Did note significant secondhand smoke exposure history.    When seen in clinic July 2021 reported continued dyspnea, fatigue, chest pressure.  She was recommended for cardiac catheterization which was performed 10/18/2019. Cath showed significant underlying 3-vessel CAD with patent LIMA-LAD, SVG-diagonal. SVG-second diagonal proviced competitive flow to LAD. LIMA small caliber with moderate diffuse disease in mid and distal segment. Mod stenosis in mid-distal LCx. Significant mid RCA stenosis which was treated with DESx2. RHC with normal filling pressures bilaterally, mild PAH.  Coreg started due to severe hypertension. Of note, her Lasix, Enalapril were stopped previously due to declining renal function. Labs on 10/19/19 showed her creatinine had improved to 1.07 and GFR to 51.   At last clinic she reported feeling overall well. Her Enalapril was  added back due to hypertension. Her Atorvastatin was reduced due to diarrhea.   At follow-up January 2042 noted persistent fatigue as well as bradycardia.  Carvedilol was discontinued.  Lab work showed elevated TSH 5.157 and she was noted to have been taking her levothyroxine every day.  She was contacted and resumed daily dosing.  She additionally self resumed carvedilol without persistent fatigue.  ***  EKGs/Labs/Other Studies Reviewed:   The following studies were reviewed today:  [facility] 10/18/19 1.  Significant underlying three-vessel coronary artery disease with patent LIMA to LAD and SVG to second diagonal.  The SVG to second diagonal provides competitive flow to the LAD.  The LIMA itself is of a small caliber with moderate diffuse disease in the mid and distal segment.  There is moderate stenosis in the mid to distal left circumflex which was not well visualized given that the left main could not be selectively engaged.  There was significant mid RCA stenosis in a large vessel.  The RCA was not bypassed. 2.  Normal LV systolic function. 3.  Right heart  catheterization showed normal right and left-sided filling pressures, mild pulmonary hypertension normal cardiac output.  Severe systemic hypertension with systolic blood pressure around 200. 4.  Successful drug-eluting stent placement to the mid right coronary artery.  A second overlapping stent was placed proximally due to proximal edge dissection.   Recommendations: Dual antiplatelet therapy for at least 1 year. Aggressive treatment of risk factors. Blood pressure control.  I elected to add carvedilol. Hydrate overnight given underlying chronic kidney disease.  150 mL of contrast was used.  EKG:  No EKG is ordered today.  The ekg independently reviewed from 10/27/19  demonstrates SB 54 bpm with incomplete RBBB and no acute ST/T wave changes. ***  Recent Labs: 03/29/2020: TSH 5.157 08/18/2020: ALT 12; BUN 25; Creatinine, Ser 1.41;  Hemoglobin 11.9; Platelets 306; Potassium 5.0; Sodium 133  Recent Lipid Panel    Component Value Date/Time   CHOL 131 10/19/2019 0416   TRIG 120 10/19/2019 0416   HDL 37 (L) 10/19/2019 0416   CHOLHDL 3.5 10/19/2019 0416   VLDL 24 10/19/2019 0416   LDLCALC 70 10/19/2019 0416   LDLDIRECT 75.0 12/11/2019 1436    Home Medications   No outpatient medications have been marked as taking for the 08/20/20 encounter (Appointment) with Alver Sorrow, NP.    Review of Systems  All other systems reviewed and are otherwise negative except as noted above.  Physical Exam  *** VS:  There were no vitals taken for this visit. , BMI There is no height or weight on file to calculate BMI. GEN: Well nourished, well developed, in no acute distress. HEENT: normal. Neck: Supple, no JVD, carotid bruits, or masses. Cardiac: RRR, no murmurs, rubs, or gallops. No clubbing, cyanosis, edema.  Radials/DP/PT 2+ and equal bilaterally.  Respiratory:  Respirations regular and unlabored, clear to auscultation bilaterally. GI: Soft, nontender, nondistended, BS + x 4. MS: No deformity or atrophy. Skin: Warm and dry, no rash.   Neuro:  Strength and sensation are intact. Psych: Normal affect.  Assessment & Plan  *** 1. CAD s/p CABG 2008 and s/p DESx2 to RCA 09/2019 - Stable with no anginal symptoms. GDMT includes DAPT aspirin/plavix for one year, Coreg, statin.  ****  2. HLD, LDL goal <70 -atorvastatin reduced to 40 mg daily clinic visit 10/27/2019 due to diarrhea. 7/29'/21 LDL 70.    3. HTN -***  4. Mild pulmonary hypertension - By cath 09/2019. Defer re-initiation of Lasix at this time as she is well compensated.    5. DM2 - Continue to follow with PCP. If continued difficulties with creatinine/GFR may need to consider alternate agent to Metformin***  6. Diastolic dysfunction - Euvolemic and well compensated. No indication for loop diuretic at this time. Continue Coreg.***  7. PAD - Continue aspirin, statin.  Continue to follow with vascular. No signs of worsening claudication.  8. OSA - CPAP compliance encouraged.  9. Depression -following with her primary care provider.  She is on max dose Zoloft.  She was recently referred to psychiatry for further assistance with medications.  Agree with this referral.  Anticipate some of the fatigue she reports is likely due to depression.  Particularly she has been off her beta-blocker for 3 weeks with no improvement. ***  Disposition: Follow up*** in 3 month(s) with Dr. Kirke Corin or APP.  Alver Sorrow, NP 08/20/2020, 7:44 AM

## 2020-08-27 NOTE — Discharge Instructions (Signed)

## 2020-08-28 ENCOUNTER — Ambulatory Visit
Admission: RE | Admit: 2020-08-28 | Discharge: 2020-08-28 | Disposition: A | Discharge status: Home / Self Care | Payer: Medicare Other | Attending: Ophthalmology | Admitting: Ophthalmology

## 2020-08-28 ENCOUNTER — Ambulatory Visit: Payer: Medicare Other | Admitting: Anesthesiology

## 2020-08-28 ENCOUNTER — Encounter
Admission: RE | Disposition: A | Discharge status: Home / Self Care | Payer: Self-pay | Source: Home / Self Care | Attending: Ophthalmology

## 2020-08-28 ENCOUNTER — Encounter: Payer: Self-pay | Admitting: Ophthalmology

## 2020-08-28 ENCOUNTER — Other Ambulatory Visit: Payer: Self-pay

## 2020-08-28 DIAGNOSIS — Z8249 Family history of ischemic heart disease and other diseases of the circulatory system: Secondary | ICD-10-CM | POA: Diagnosis not present

## 2020-08-28 DIAGNOSIS — Z803 Family history of malignant neoplasm of breast: Secondary | ICD-10-CM | POA: Insufficient documentation

## 2020-08-28 DIAGNOSIS — Z7902 Long term (current) use of antithrombotics/antiplatelets: Secondary | ICD-10-CM | POA: Diagnosis not present

## 2020-08-28 DIAGNOSIS — Z79899 Other long term (current) drug therapy: Secondary | ICD-10-CM | POA: Diagnosis not present

## 2020-08-28 DIAGNOSIS — K219 Gastro-esophageal reflux disease without esophagitis: Secondary | ICD-10-CM | POA: Insufficient documentation

## 2020-08-28 DIAGNOSIS — Z833 Family history of diabetes mellitus: Secondary | ICD-10-CM | POA: Diagnosis not present

## 2020-08-28 DIAGNOSIS — Z7984 Long term (current) use of oral hypoglycemic drugs: Secondary | ICD-10-CM | POA: Insufficient documentation

## 2020-08-28 DIAGNOSIS — Z794 Long term (current) use of insulin: Secondary | ICD-10-CM | POA: Insufficient documentation

## 2020-08-28 DIAGNOSIS — Z888 Allergy status to other drugs, medicaments and biological substances status: Secondary | ICD-10-CM | POA: Diagnosis not present

## 2020-08-28 DIAGNOSIS — Z880 Allergy status to penicillin: Secondary | ICD-10-CM | POA: Insufficient documentation

## 2020-08-28 DIAGNOSIS — Z955 Presence of coronary angioplasty implant and graft: Secondary | ICD-10-CM | POA: Diagnosis not present

## 2020-08-28 DIAGNOSIS — Z7982 Long term (current) use of aspirin: Secondary | ICD-10-CM | POA: Insufficient documentation

## 2020-08-28 DIAGNOSIS — Z951 Presence of aortocoronary bypass graft: Secondary | ICD-10-CM | POA: Insufficient documentation

## 2020-08-28 DIAGNOSIS — Z885 Allergy status to narcotic agent status: Secondary | ICD-10-CM | POA: Diagnosis not present

## 2020-08-28 DIAGNOSIS — I10 Essential (primary) hypertension: Secondary | ICD-10-CM | POA: Diagnosis not present

## 2020-08-28 DIAGNOSIS — E1136 Type 2 diabetes mellitus with diabetic cataract: Secondary | ICD-10-CM | POA: Insufficient documentation

## 2020-08-28 DIAGNOSIS — I2510 Atherosclerotic heart disease of native coronary artery without angina pectoris: Secondary | ICD-10-CM | POA: Insufficient documentation

## 2020-08-28 DIAGNOSIS — H2512 Age-related nuclear cataract, left eye: Secondary | ICD-10-CM | POA: Diagnosis not present

## 2020-08-28 DIAGNOSIS — E039 Hypothyroidism, unspecified: Secondary | ICD-10-CM | POA: Insufficient documentation

## 2020-08-28 HISTORY — PX: CATARACT EXTRACTION W/PHACO: SHX586

## 2020-08-28 HISTORY — DX: Dizziness and giddiness: R42

## 2020-08-28 LAB — GLUCOSE, CAPILLARY
Glucose-Capillary: 136 mg/dL — ABNORMAL HIGH (ref 70–99)
Glucose-Capillary: 142 mg/dL — ABNORMAL HIGH (ref 70–99)

## 2020-08-28 SURGERY — PHACOEMULSIFICATION, CATARACT, WITH IOL INSERTION
Anesthesia: Monitor Anesthesia Care | Site: Eye | Laterality: Left

## 2020-08-28 MED ORDER — LACTATED RINGERS IV SOLN: INTRAVENOUS | Status: DC

## 2020-08-28 MED ORDER — ARMC OPHTHALMIC DILATING DROPS
1.0000 "application " | OPHTHALMIC | Status: DC | PRN
  Administered 2020-08-28 (×3): 1 via OPHTHALMIC

## 2020-08-28 MED ORDER — ONDANSETRON HCL 4 MG/2ML IJ SOLN
INTRAMUSCULAR | Status: DC | PRN
  Administered 2020-08-28: 4 mg via INTRAVENOUS

## 2020-08-28 MED ORDER — FENTANYL CITRATE (PF) 100 MCG/2ML IJ SOLN
INTRAMUSCULAR | Status: DC | PRN
  Administered 2020-08-28: 50 ug via INTRAVENOUS

## 2020-08-28 MED ORDER — EPINEPHRINE PF 1 MG/ML IJ SOLN
INTRAOCULAR | Status: DC | PRN
  Administered 2020-08-28: 49 mL via OPHTHALMIC

## 2020-08-28 MED ORDER — MIDAZOLAM HCL 2 MG/2ML IJ SOLN
INTRAMUSCULAR | Status: DC | PRN
  Administered 2020-08-28: 1 mg via INTRAVENOUS

## 2020-08-28 MED ORDER — TETRACAINE HCL 0.5 % OP SOLN
1.0000 [drp] | OPHTHALMIC | Status: DC | PRN
  Administered 2020-08-28 (×4): 1 [drp] via OPHTHALMIC

## 2020-08-28 MED ORDER — LIDOCAINE HCL (PF) 2 % IJ SOLN
INTRAOCULAR | Status: DC | PRN
  Administered 2020-08-28: 1 mL

## 2020-08-28 MED ORDER — BRIMONIDINE TARTRATE-TIMOLOL 0.2-0.5 % OP SOLN
OPHTHALMIC | Status: DC | PRN
  Administered 2020-08-28: 1 [drp] via OPHTHALMIC

## 2020-08-28 MED ORDER — NA HYALUR & NA CHOND-NA HYALUR 0.4-0.35 ML IO KIT
PACK | INTRAOCULAR | Status: DC | PRN
  Administered 2020-08-28: 1 mL via INTRAOCULAR

## 2020-08-28 MED ORDER — MOXIFLOXACIN HCL 0.5 % OP SOLN
OPHTHALMIC | Status: DC | PRN
  Administered 2020-08-28: 0.2 mL via OPHTHALMIC

## 2020-08-28 SURGICAL SUPPLY — 24 items
CANNULA ANT/CHMB 27GA (MISCELLANEOUS) ×2 IMPLANT
GLOVE SURG ENC TEXT LTX SZ7.5 (GLOVE) ×2 IMPLANT
GLOVE SURG TRIUMPH 8.0 PF LTX (GLOVE) ×4 IMPLANT
GOWN STRL REUS W/ TWL LRG LVL3 (GOWN DISPOSABLE) ×2 IMPLANT
GOWN STRL REUS W/TWL LRG LVL3 (GOWN DISPOSABLE) ×4
LENS IOL TECNIS EYHANCE 14.0 (Intraocular Lens) ×2 IMPLANT
MARKER SKIN DUAL TIP RULER LAB (MISCELLANEOUS) ×2 IMPLANT
NDL RETROBULBAR .5 NSTRL (NEEDLE) IMPLANT
NEEDLE CAPSULORHEX 25GA (NEEDLE) ×2 IMPLANT
NEEDLE FILTER BLUNT 18X 1/2SAF (NEEDLE) ×2
NEEDLE FILTER BLUNT 18X1 1/2 (NEEDLE) ×2 IMPLANT
PACK CATARACT BRASINGTON (MISCELLANEOUS) ×2 IMPLANT
PACK EYE AFTER SURG (MISCELLANEOUS) ×2 IMPLANT
PACK OPTHALMIC (MISCELLANEOUS) ×2 IMPLANT
RING MALYGIN 7.0 (MISCELLANEOUS) IMPLANT
SOLUTION OPHTHALMIC SALT (MISCELLANEOUS) ×2 IMPLANT
SUT ETHILON 10-0 CS-B-6CS-B-6 (SUTURE)
SUT VICRYL  9 0 (SUTURE)
SUT VICRYL 9 0 (SUTURE) IMPLANT
SUTURE EHLN 10-0 CS-B-6CS-B-6 (SUTURE) IMPLANT
SYR 3ML LL SCALE MARK (SYRINGE) ×4 IMPLANT
SYR TB 1ML LUER SLIP (SYRINGE) ×2 IMPLANT
WATER STERILE IRR 250ML POUR (IV SOLUTION) ×2 IMPLANT
WIPE NON LINTING 3.25X3.25 (MISCELLANEOUS) ×2 IMPLANT

## 2020-08-28 NOTE — Anesthesia Postprocedure Evaluation (Signed)
Anesthesia Post Note  Patient: Alexandria Marquez  Procedure(s) Performed: CATARACT EXTRACTION PHACO AND INTRAOCULAR LENS PLACEMENT (IOC) LEFT DIABETIC (Left Eye)     Patient location during evaluation: PACU Anesthesia Type: MAC Level of consciousness: awake and alert Pain management: pain level controlled Vital Signs Assessment: post-procedure vital signs reviewed and stable Respiratory status: spontaneous breathing, nonlabored ventilation, respiratory function stable and patient connected to nasal cannula oxygen Cardiovascular status: stable and blood pressure returned to baseline Postop Assessment: no apparent nausea or vomiting Anesthetic complications: no   No complications documented.  Fidel Levy

## 2020-08-28 NOTE — Op Note (Signed)
  OPERATIVE NOTE  Alexandria Marquez [id number] 08/28/2020   PREOPERATIVE DIAGNOSIS:  Nuclear sclerotic cataract left eye. H25.12   POSTOPERATIVE DIAGNOSIS:    Nuclear sclerotic cataract left eye.     PROCEDURE:  Phacoemusification with posterior chamber intraocular lens placement of the left eye  Ultrasound time: Procedure(s) with comments: CATARACT EXTRACTION PHACO AND INTRAOCULAR LENS PLACEMENT (IOC) LEFT DIABETIC (Left) - 6.59 0:53.9 12.2%  LENS:   Implant Name Type Inv. Item Serial No. Manufacturer Lot No. LRB No. Used Action  LENS IOL TECNIS EYHANCE 14.0 - [id number] Intraocular Lens LENS IOL TECNIS EYHANCE 14.0 [id number] JOHNSON   Left 1 Implanted      SURGEON:  Deirdre Evener, MD   ANESTHESIA:  Topical with tetracaine drops and 2% Xylocaine jelly, augmented with 1% preservative-free intracameral lidocaine.    COMPLICATIONS:  None.   DESCRIPTION OF PROCEDURE:  The patient was identified in the holding room and transported to the operating room and placed in the supine position under the operating microscope.  The left eye was identified as the operative eye and it was prepped and draped in the usual sterile ophthalmic fashion.   A 1 millimeter clear-corneal paracentesis was made at the 1:30 position.  0.5 ml of preservative-free 1% lidocaine was injected into the anterior chamber.  The anterior chamber was filled with Viscoat viscoelastic.  A 2.4 millimeter keratome was used to make a near-clear corneal incision at the 10:30 position.  .  A curvilinear capsulorrhexis was made with a cystotome and capsulorrhexis forceps.  Balanced salt solution was used to hydrodissect and hydrodelineate the nucleus.   Phacoemulsification was then used in stop and chop fashion to remove the lens nucleus and epinucleus.  The remaining cortex was then removed using the irrigation and aspiration handpiece. Provisc was then placed into the capsular bag to distend it for lens placement.  A lens  was then injected into the capsular bag.  The remaining viscoelastic was aspirated.   Wounds were hydrated with balanced salt solution.  The anterior chamber was inflated to a physiologic pressure with balanced salt solution.  No wound leaks were noted. Vigamox 0.2 ml of a 1mg  per ml solution was injected into the anterior chamber for a dose of 0.2 mg of intracameral antibiotic at the completion of the case.   Timolol and Brimonidine drops were applied to the eye.  The patient was taken to the recovery room in stable condition without complications of anesthesia or surgery.  [name] 08/28/2020, 9:42 AM
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
## 2020-08-28 NOTE — Transfer of Care (Signed)
Immediate Anesthesia Transfer of Care Note  Patient: Alexandria Marquez  Procedure(s) Performed: CATARACT EXTRACTION PHACO AND INTRAOCULAR LENS PLACEMENT (IOC) LEFT DIABETIC (Left Eye)  Patient Location: PACU  Anesthesia Type: MAC  Level of Consciousness: awake, alert  and patient cooperative  Airway and Oxygen Therapy: Patient Spontanous Breathing and Patient connected to supplemental oxygen  Post-op Assessment: Post-op Vital signs reviewed, Patient's Cardiovascular Status Stable, Respiratory Function Stable, Patent Airway and No signs of Nausea or vomiting  Post-op Vital Signs: Reviewed and stable  Complications: No complications documented.

## 2020-08-28 NOTE — H&P (Signed)
[facility]   Primary Care Physician:  Patrice Paradise, MD Ophthalmologist: Dr. Lockie Mola  Pre-Procedure History & Physical: HPI:  Alexandria Marquez is a 75 y.o. female here for ophthalmic surgery.   Past Medical History:  Diagnosis Date  . Anxiety   . Arthritis   . Asthma    when young  . Coronary artery disease    a. 2008 s/p CABG x 2 (LIMA->LAD, VG->Diag); b. 09/2019 PCI: LM min irregs, LAD 100p/m, 85/46m, LCX 43m/d, RCA 62m (2.0x18 Resolute Onyx DES), VG->D2 ok, LIMA->LAD 50p/d. EF 55-65%.  . Depression   . Diabetes mellitus without complication (HCC)   . Diastolic dysfunction    a. 08/2019 Echo: EF 60-65%, no rwma, mild LVH, Gr1 DD. RVSP 43.60mmHg. Mild MR, mild to mod TR. AoV sclerosis.  . Environmental and seasonal allergies   . GERD (gastroesophageal reflux disease)   . History of hiatal hernia   . Hyperlipidemia   . Hypertension   . Hypothyroid   . Hypothyroidism   . Lower extremity edema   . Motion sickness   . OSA (obstructive sleep apnea) 06/04/2014  . Osteoarthritis   . Scoliosis   . Shortness of breath dyspnea    doe  . Vertigo     Past Surgical History:  Procedure Laterality Date  . ABDOMINAL HYSTERECTOMY    . APPENDECTOMY    . BACK SURGERY  2014   lumbar fusion/plate/rods/screws  . CARDIAC CATHETERIZATION    . CHOLECYSTECTOMY  2005  . COLONOSCOPY WITH PROPOFOL N/A 01/18/2017   Procedure: COLONOSCOPY WITH PROPOFOL;  Surgeon: Christena Deem, MD;  Location: [facility] ENDOSCOPY;  Service: Endoscopy;  Laterality: N/A;  . CORONARY ARTERY BYPASS GRAFT     2008 2 vessels  . CORONARY ARTERY BYPASS GRAFT  2008  . CORONARY STENT INTERVENTION  10/18/2019  . CORONARY STENT INTERVENTION N/A 10/18/2019   Procedure: CORONARY STENT INTERVENTION;  Surgeon: Iran Ouch, MD;  Location: [facility] INVASIVE CV LAB;  Service: Cardiovascular;  Laterality: N/A;  . ESOPHAGOGASTRODUODENOSCOPY (EGD) WITH PROPOFOL N/A 05/16/2018   Procedure:  ESOPHAGOGASTRODUODENOSCOPY (EGD) WITH PROPOFOL;  Surgeon: Christena Deem, MD;  Location: [facility] ENDOSCOPY;  Service: Endoscopy;  Laterality: N/A;  . FRACTURE SURGERY    . HERNIA REPAIR  1987   incisional  . JOINT REPLACEMENT Right 2017   knee  . ORIF ANKLE FRACTURE Left 06/19/2016   Procedure: OPEN REDUCTION INTERNAL FIXATION (ORIF) ANKLE FRACTURE;  Surgeon: Juanell Fairly, MD;  Location: [facility] ORS;  Service: Orthopedics;  Laterality: Left;  . rcr    . RIGHT/LEFT HEART CATH AND CORONARY/GRAFT ANGIOGRAPHY N/A 10/18/2019   Procedure: RIGHT/LEFT HEART CATH AND CORONARY/GRAFT ANGIOGRAPHY;  Surgeon: Iran Ouch, MD;  Location: [facility] INVASIVE CV LAB;  Service: Cardiovascular;  Laterality: N/A;  . ROTATOR CUFF REPAIR Left 2013  . TONSILLECTOMY    . TOTAL KNEE ARTHROPLASTY Right 12/19/2015   Procedure: TOTAL KNEE ARTHROPLASTY;  Surgeon: Christena Flake, MD;  Location: [facility] ORS;  Service: Orthopedics;  Laterality: Right;  . TUBAL LIGATION    . VISCERAL ANGIOGRAPHY N/A 05/23/2018   Procedure: VISCERAL ANGIOGRAPHY;  Surgeon: Annice Needy, MD;  Location: [facility] INVASIVE CV LAB;  Service: Cardiovascular;  Laterality: N/A;    Prior to Admission medications   Medication Sig Start Date End Date Taking? Authorizing Provider  amLODipine (NORVASC) 5 MG tablet Take 1 tablet (5 mg total) by mouth 2 (two) times daily. 10/19/19  Yes Duke, Roe Rutherford, PA  aspirin 81 MG tablet  Take 1 tablet (81 mg total) by mouth daily. 10/05/16  Yes Enid Baas, MD  atorvastatin (LIPITOR) 80 MG tablet TAKE 1 TABLET (80 MG TOTAL) BY MOUTH AT BEDTIME. 04/26/20  Yes Creig Hines, NP  carvedilol (COREG) 3.125 MG tablet Take 1 tablet (3.125 mg total) by mouth 2 (two) times daily. 12/11/19 06/08/20 Yes Alver Sorrow, NP  clopidogrel (PLAVIX) 75 MG tablet Take 1 tablet (75 mg total) by mouth daily with breakfast. 12/11/19  Yes Alver Sorrow, NP  enalapril (VASOTEC) 2.5 MG tablet Take 1 tablet (2.5 mg total) by mouth 2  (two) times daily. 10/27/19  Yes Alver Sorrow, NP  fluticasone (FLONASE) 50 MCG/ACT nasal spray Place 1-2 sprays into both nostrils daily as needed for allergies.    Yes [provider]  gabapentin (NEURONTIN) 300 MG capsule Take 600 mg by mouth at bedtime.   Yes [provider]  glimepiride (AMARYL) 2 MG tablet Take 2 mg by mouth daily with breakfast.   Yes [provider]  insulin detemir (LEVEMIR) 100 UNIT/ML injection Inject 27 Units into the skin daily.    Yes [provider]  levothyroxine (SYNTHROID) 88 MCG tablet Take by mouth. 12/07/19 12/06/20 Yes [provider]  metFORMIN (GLUCOPHAGE) 1000 MG tablet Take 1 tablet (1,000 mg total) by mouth 2 (two) times daily with a meal. Resume on 10/21/19. 10/19/19  Yes Duke, Roe Rutherford, PA  ondansetron (ZOFRAN-ODT) 4 MG disintegrating tablet Take 4 mg by mouth every 8 (eight) hours as needed for nausea or vomiting.   Yes [provider]  pantoprazole (PROTONIX) 40 MG tablet Take 40 mg by mouth daily.    Yes [provider]  sertraline (ZOLOFT) 100 MG tablet Take 100 mg by mouth daily.   Yes [provider]  tiZANidine (ZANAFLEX) 2 MG tablet Take 2 mg by mouth every 6 (six) hours as needed for muscle spasms.   Yes [provider]  VITAMIN D PO Take 1 tablet by mouth daily.    Yes [provider]  glucose blood (ONETOUCH ULTRA) test strip USE 2 (TWO) TIMES DAILY. USE AS INSTRUCTED. 07/20/17   [provider]  nitroGLYCERIN (NITROSTAT) 0.4 MG SL tablet Place 1 tablet (0.4 mg total) under the tongue every 5 (five) minutes as needed for chest pain. 10/19/19 10/18/20  Duke, Roe Rutherford, PA  Polyethyl Glycol-Propyl Glycol 0.4-0.3 % SOLN Place 1 drop into both eyes 3 (three) times daily as needed (for dry eyes).    [provider]    Allergies as of 06/26/2020 - Review Complete 03/29/2020  Allergen Reaction Noted  . Simethicone Anaphylaxis and Hives  04/04/2019  . Morphine and related Nausea And Vomiting 12/04/2015  . Penicillins Hives 12/04/2015    Family History  Problem Relation Age of Onset  . Breast cancer Maternal Aunt 30  . Heart disease Mother   . Heart attack Mother   . Heart disease Father   . Heart attack Father   . Diabetes Father   . Breast cancer Paternal Grandmother   . Cancer Paternal Aunt     Social History   Socioeconomic History  . Marital status: Married    Spouse name: Ranell Patrick  . Number of children: Not on file  . Years of education: Not on file  . Highest education level: Not on file  Occupational History  . Not on file  Tobacco Use  . Smoking status: Never Smoker  . Smokeless tobacco: Never Used  Vaping Use  .  Vaping Use: Never used  Substance and Sexual Activity  . Alcohol use: No  . Drug use: Not Currently    Types: Hydrocodone  . Sexual activity: Not Currently  Other Topics Concern  . Not on file  Social History Narrative  . Not on file   Social Determinants of Health   Financial Resource Strain: Not on file  Food Insecurity: Not on file  Transportation Needs: Not on file  Physical Activity: Not on file  Stress: Not on file  Social Connections: Not on file  Intimate Partner Violence: Not on file    Review of Systems: See HPI, otherwise negative ROS  Physical Exam: BP (!) 130/52   Pulse 61   Temp 97.7 F (36.5 C)   Ht 5' 0.25" (1.53 m)   Wt 76.7 kg   SpO2 98%   BMI 33.01 kg/m  General:   Alert,  pleasant and cooperative in NAD Head:  Normocephalic and atraumatic. Lungs:  Clear to auscultation.    Heart:  Regular rate and rhythm.   Impression/Plan: Alexandria Marquez is here for ophthalmic surgery.  Risks, benefits, limitations, and alternatives regarding ophthalmic surgery have been reviewed with the patient.  Questions have been answered.  All parties agreeable.   Lockie Mola, MD  08/28/2020, 8:19 AM

## 2020-08-28 NOTE — Anesthesia Procedure Notes (Signed)
Procedure Name: MAC Date/Time: 08/28/2020 9:22 AM Performed by: Dionne Bucy, CRNA Pre-anesthesia Checklist: Patient identified, Emergency Drugs available, Suction available, Patient being monitored and Timeout performed Patient Re-evaluated:Patient Re-evaluated prior to induction Oxygen Delivery Method: Nasal cannula Placement Confirmation: positive ETCO2

## 2020-08-29 ENCOUNTER — Encounter: Payer: Self-pay | Admitting: Ophthalmology

## 2020-08-30 ENCOUNTER — Other Ambulatory Visit: Payer: Self-pay

## 2020-08-30 MED ORDER — ATORVASTATIN CALCIUM 80 MG PO TABS: 1.0000 | ORAL_TABLET | Freq: Every day | ORAL | 3 refills | Status: DC

## 2020-09-05 ENCOUNTER — Other Ambulatory Visit: Payer: Self-pay | Admitting: Family

## 2020-09-11 ENCOUNTER — Ambulatory Visit: Payer: Medicare Other | Admitting: Anesthesiology

## 2020-09-11 ENCOUNTER — Encounter
Admission: RE | Disposition: A | Discharge status: Home / Self Care | Payer: Self-pay | Source: Home / Self Care | Attending: Ophthalmology

## 2020-09-11 ENCOUNTER — Encounter: Payer: Self-pay | Admitting: Ophthalmology

## 2020-09-11 ENCOUNTER — Other Ambulatory Visit: Payer: Self-pay

## 2020-09-11 ENCOUNTER — Ambulatory Visit
Admission: RE | Admit: 2020-09-11 | Discharge: 2020-09-11 | Disposition: A | Discharge status: Home / Self Care | Payer: Medicare Other | Attending: Ophthalmology | Admitting: Ophthalmology

## 2020-09-11 DIAGNOSIS — Z955 Presence of coronary angioplasty implant and graft: Secondary | ICD-10-CM | POA: Insufficient documentation

## 2020-09-11 DIAGNOSIS — Z809 Family history of malignant neoplasm, unspecified: Secondary | ICD-10-CM | POA: Insufficient documentation

## 2020-09-11 DIAGNOSIS — Z794 Long term (current) use of insulin: Secondary | ICD-10-CM | POA: Insufficient documentation

## 2020-09-11 DIAGNOSIS — Z880 Allergy status to penicillin: Secondary | ICD-10-CM | POA: Diagnosis not present

## 2020-09-11 DIAGNOSIS — Z803 Family history of malignant neoplasm of breast: Secondary | ICD-10-CM | POA: Insufficient documentation

## 2020-09-11 DIAGNOSIS — Z79899 Other long term (current) drug therapy: Secondary | ICD-10-CM | POA: Diagnosis not present

## 2020-09-11 DIAGNOSIS — H2511 Age-related nuclear cataract, right eye: Secondary | ICD-10-CM | POA: Insufficient documentation

## 2020-09-11 DIAGNOSIS — Z8249 Family history of ischemic heart disease and other diseases of the circulatory system: Secondary | ICD-10-CM | POA: Diagnosis not present

## 2020-09-11 DIAGNOSIS — Z7984 Long term (current) use of oral hypoglycemic drugs: Secondary | ICD-10-CM | POA: Insufficient documentation

## 2020-09-11 DIAGNOSIS — E1136 Type 2 diabetes mellitus with diabetic cataract: Secondary | ICD-10-CM | POA: Insufficient documentation

## 2020-09-11 DIAGNOSIS — E039 Hypothyroidism, unspecified: Secondary | ICD-10-CM | POA: Insufficient documentation

## 2020-09-11 DIAGNOSIS — Z885 Allergy status to narcotic agent status: Secondary | ICD-10-CM | POA: Diagnosis not present

## 2020-09-11 DIAGNOSIS — Z833 Family history of diabetes mellitus: Secondary | ICD-10-CM | POA: Diagnosis not present

## 2020-09-11 DIAGNOSIS — Z951 Presence of aortocoronary bypass graft: Secondary | ICD-10-CM | POA: Diagnosis not present

## 2020-09-11 DIAGNOSIS — Z7902 Long term (current) use of antithrombotics/antiplatelets: Secondary | ICD-10-CM | POA: Diagnosis not present

## 2020-09-11 DIAGNOSIS — Z7982 Long term (current) use of aspirin: Secondary | ICD-10-CM | POA: Diagnosis not present

## 2020-09-11 HISTORY — PX: CATARACT EXTRACTION W/PHACO: SHX586

## 2020-09-11 LAB — GLUCOSE, CAPILLARY
Glucose-Capillary: 187 mg/dL — ABNORMAL HIGH (ref 70–99)
Glucose-Capillary: 151 mg/dL — ABNORMAL HIGH (ref 70–99)

## 2020-09-11 SURGERY — PHACOEMULSIFICATION, CATARACT, WITH IOL INSERTION
Anesthesia: Monitor Anesthesia Care | Site: Eye | Laterality: Right

## 2020-09-11 MED ORDER — CYCLOPENTOLATE HCL 2 % OP SOLN
1.0000 [drp] | OPHTHALMIC | Status: AC
  Administered 2020-09-11 (×3): 1 [drp] via OPHTHALMIC

## 2020-09-11 MED ORDER — LIDOCAINE HCL (PF) 2 % IJ SOLN
INTRAOCULAR | Status: DC | PRN
  Administered 2020-09-11: 1 mL via INTRAMUSCULAR

## 2020-09-11 MED ORDER — NA HYALUR & NA CHOND-NA HYALUR 0.4-0.35 ML IO KIT
PACK | INTRAOCULAR | Status: DC | PRN
  Administered 2020-09-11: 1 mL via INTRAOCULAR

## 2020-09-11 MED ORDER — FENTANYL CITRATE (PF) 100 MCG/2ML IJ SOLN
INTRAMUSCULAR | Status: DC | PRN
  Administered 2020-09-11 (×2): 50 ug via INTRAVENOUS

## 2020-09-11 MED ORDER — PHENYLEPHRINE HCL 10 % OP SOLN
1.0000 [drp] | OPHTHALMIC | Status: AC
  Administered 2020-09-11 (×3): 1 [drp] via OPHTHALMIC

## 2020-09-11 MED ORDER — ACETAMINOPHEN 160 MG/5ML PO SOLN: 325.0000 mg | ORAL | Status: DC | PRN

## 2020-09-11 MED ORDER — TETRACAINE HCL 0.5 % OP SOLN
1.0000 [drp] | OPHTHALMIC | Status: DC | PRN
  Administered 2020-09-11 (×3): 1 [drp] via OPHTHALMIC

## 2020-09-11 MED ORDER — ONDANSETRON HCL 4 MG/2ML IJ SOLN
INTRAMUSCULAR | Status: DC | PRN
  Administered 2020-09-11: 4 mg via INTRAVENOUS

## 2020-09-11 MED ORDER — MOXIFLOXACIN HCL 0.5 % OP SOLN
OPHTHALMIC | Status: DC | PRN
  Administered 2020-09-11: 0.2 mL via OPHTHALMIC

## 2020-09-11 MED ORDER — LACTATED RINGERS IV SOLN: INTRAVENOUS | Status: DC

## 2020-09-11 MED ORDER — MIDAZOLAM HCL 2 MG/2ML IJ SOLN
INTRAMUSCULAR | Status: DC | PRN
  Administered 2020-09-11: 2 mg via INTRAVENOUS

## 2020-09-11 MED ORDER — ACETAMINOPHEN 325 MG PO TABS
325.0000 mg | ORAL_TABLET | ORAL | Status: DC | PRN
Start: 2020-09-11 — End: 2020-09-11

## 2020-09-11 MED ORDER — EPINEPHRINE PF 1 MG/ML IJ SOLN
INTRAOCULAR | Status: DC | PRN
  Administered 2020-09-11: 81 mL via OPHTHALMIC

## 2020-09-11 MED ORDER — BRIMONIDINE TARTRATE-TIMOLOL 0.2-0.5 % OP SOLN
OPHTHALMIC | Status: DC | PRN
  Administered 2020-09-11: 1 [drp] via OPHTHALMIC

## 2020-09-11 SURGICAL SUPPLY — 21 items
CANNULA ANT/CHMB 27GA (MISCELLANEOUS) ×2 IMPLANT
GLOVE SURG ENC TEXT LTX SZ7.5 (GLOVE) ×2 IMPLANT
GLOVE SURG TRIUMPH 8.0 PF LTX (GLOVE) ×2 IMPLANT
GOWN STRL REUS W/ TWL LRG LVL3 (GOWN DISPOSABLE) ×2 IMPLANT
GOWN STRL REUS W/TWL LRG LVL3 (GOWN DISPOSABLE) ×4
LENS IOL TECNIS EYHANCE 15.0 (Intraocular Lens) ×2 IMPLANT
MARKER SKIN DUAL TIP RULER LAB (MISCELLANEOUS) ×2 IMPLANT
NDL RETROBULBAR .5 NSTRL (NEEDLE) IMPLANT
NEEDLE CAPSULORHEX 25GA (NEEDLE) ×2 IMPLANT
NEEDLE FILTER BLUNT 18X 1/2SAF (NEEDLE) ×2
NEEDLE FILTER BLUNT 18X1 1/2 (NEEDLE) ×2 IMPLANT
PACK EYE AFTER SURG (MISCELLANEOUS) ×2 IMPLANT
RING MALYGIN 7.0 (MISCELLANEOUS) IMPLANT
SUT ETHILON 10-0 CS-B-6CS-B-6 (SUTURE)
SUT VICRYL  9 0 (SUTURE)
SUT VICRYL 9 0 (SUTURE) IMPLANT
SUTURE EHLN 10-0 CS-B-6CS-B-6 (SUTURE) IMPLANT
SYR 3ML LL SCALE MARK (SYRINGE) ×4 IMPLANT
SYR TB 1ML LUER SLIP (SYRINGE) ×2 IMPLANT
WATER STERILE IRR 250ML POUR (IV SOLUTION) ×2 IMPLANT
WIPE NON LINTING 3.25X3.25 (MISCELLANEOUS) ×2 IMPLANT

## 2020-09-11 NOTE — Anesthesia Postprocedure Evaluation (Signed)
Anesthesia Post Note  Patient: Alexandria Marquez  Procedure(s) Performed: CATARACT EXTRACTION PHACO AND INTRAOCULAR LENS PLACEMENT (IOC) RIGHT DIABETIC (Right: Eye)     Patient location during evaluation: PACU Anesthesia Type: MAC Level of consciousness: awake and alert Pain management: pain level controlled Vital Signs Assessment: post-procedure vital signs reviewed and stable Respiratory status: spontaneous breathing, nonlabored ventilation, respiratory function stable and patient connected to nasal cannula oxygen Cardiovascular status: stable and blood pressure returned to baseline Postop Assessment: no apparent nausea or vomiting Anesthetic complications: no   No notable events documented.  Trecia Rogers

## 2020-09-11 NOTE — Anesthesia Procedure Notes (Signed)
Procedure Name: MAC Date/Time: 09/11/2020 11:09 AM Performed by: Jeannene Patella, CRNA Pre-anesthesia Checklist: Patient identified, Emergency Drugs available, Suction available, Timeout performed and Patient being monitored Patient Re-evaluated:Patient Re-evaluated prior to induction Oxygen Delivery Method: Nasal cannula Placement Confirmation: positive ETCO2

## 2020-09-11 NOTE — H&P (Signed)
[facility]   Primary Care Physician:  Patrice Paradise, MD Ophthalmologist: Dr. Lockie Mola  Pre-Procedure History & Physical: HPI:  Alexandria Marquez is a 75 y.o. female here for ophthalmic surgery.   Past Medical History:  Diagnosis Date   Anxiety    Arthritis    Asthma    when young   Coronary artery disease    a. 2008 s/p CABG x 2 (LIMA->LAD, VG->Diag); b. 09/2019 PCI: LM min irregs, LAD 100p/m, 85/65m, LCX 84m/d, RCA 47m (2.0x18 Resolute Onyx DES), VG->D2 ok, LIMA->LAD 50p/d. EF 55-65%.   Depression    Diabetes mellitus without complication (HCC)    Diastolic dysfunction    a. 08/2019 Echo: EF 60-65%, no rwma, mild LVH, Gr1 DD. RVSP 43.34mmHg. Mild MR, mild to mod TR. AoV sclerosis.   Environmental and seasonal allergies    GERD (gastroesophageal reflux disease)    History of hiatal hernia    Hyperlipidemia    Hypertension    Hypothyroid    Hypothyroidism    Lower extremity edema    Motion sickness    OSA (obstructive sleep apnea) 06/04/2014   Osteoarthritis    Scoliosis    Shortness of breath dyspnea    doe   Vertigo     Past Surgical History:  Procedure Laterality Date   ABDOMINAL HYSTERECTOMY     APPENDECTOMY     BACK SURGERY  2014   lumbar fusion/plate/rods/screws   CARDIAC CATHETERIZATION     CATARACT EXTRACTION W/PHACO Left 08/28/2020   Procedure: CATARACT EXTRACTION PHACO AND INTRAOCULAR LENS PLACEMENT (IOC) LEFT DIABETIC;  Surgeon: Lockie Mola, MD;  Location: [facility];  Service: Ophthalmology;  Laterality: Left;  6.59 0:53.9 12.2%   CHOLECYSTECTOMY  2005   COLONOSCOPY WITH PROPOFOL N/A 01/18/2017   Procedure: COLONOSCOPY WITH PROPOFOL;  Surgeon: Christena Deem, MD;  Location: [facility] ENDOSCOPY;  Service: Endoscopy;  Laterality: N/A;   CORONARY ARTERY BYPASS GRAFT     2008 2 vessels   CORONARY ARTERY BYPASS GRAFT  2008   CORONARY STENT INTERVENTION  10/18/2019   CORONARY STENT INTERVENTION N/A 10/18/2019   Procedure:  CORONARY STENT INTERVENTION;  Surgeon: Iran Ouch, MD;  Location: [facility] INVASIVE CV LAB;  Service: Cardiovascular;  Laterality: N/A;   ESOPHAGOGASTRODUODENOSCOPY (EGD) WITH PROPOFOL N/A 05/16/2018   Procedure: ESOPHAGOGASTRODUODENOSCOPY (EGD) WITH PROPOFOL;  Surgeon: Christena Deem, MD;  Location: [facility] ENDOSCOPY;  Service: Endoscopy;  Laterality: N/A;   FRACTURE SURGERY     HERNIA REPAIR  1987   incisional   JOINT REPLACEMENT Right 2017   knee   ORIF ANKLE FRACTURE Left 06/19/2016   Procedure: OPEN REDUCTION INTERNAL FIXATION (ORIF) ANKLE FRACTURE;  Surgeon: Juanell Fairly, MD;  Location: [facility] ORS;  Service: Orthopedics;  Laterality: Left;   rcr     RIGHT/LEFT HEART CATH AND CORONARY/GRAFT ANGIOGRAPHY N/A 10/18/2019   Procedure: RIGHT/LEFT HEART CATH AND CORONARY/GRAFT ANGIOGRAPHY;  Surgeon: Iran Ouch, MD;  Location: [facility] INVASIVE CV LAB;  Service: Cardiovascular;  Laterality: N/A;   ROTATOR CUFF REPAIR Left 2013   TONSILLECTOMY     TOTAL KNEE ARTHROPLASTY Right 12/19/2015   Procedure: TOTAL KNEE ARTHROPLASTY;  Surgeon: Christena Flake, MD;  Location: [facility] ORS;  Service: Orthopedics;  Laterality: Right;   TUBAL LIGATION     VISCERAL ANGIOGRAPHY N/A 05/23/2018   Procedure: VISCERAL ANGIOGRAPHY;  Surgeon: Annice Needy, MD;  Location: [facility] INVASIVE CV LAB;  Service: Cardiovascular;  Laterality: N/A;    Prior to Admission medications  Medication Sig Start Date End Date Taking? Authorizing Provider  amLODipine (NORVASC) 5 MG tablet Take 1 tablet (5 mg total) by mouth 2 (two) times daily. 10/19/19  Yes Duke, Roe Rutherford, PA  aspirin 81 MG tablet Take 1 tablet (81 mg total) by mouth daily. 10/05/16  Yes Enid Baas, MD  atorvastatin (LIPITOR) 80 MG tablet Take 1 tablet (80 mg total) by mouth at bedtime. 08/30/20  Yes Creig Hines, NP  carvedilol (COREG) 3.125 MG tablet TAKE 1 TABLET (3.125 MG TOTAL) BY MOUTH 2 (TWO) TIMES DAILY. 09/05/20 03/04/21 Yes Alver Sorrow, NP   clopidogrel (PLAVIX) 75 MG tablet Take 1 tablet (75 mg total) by mouth daily with breakfast. 12/11/19  Yes Alver Sorrow, NP  enalapril (VASOTEC) 2.5 MG tablet Take 1 tablet (2.5 mg total) by mouth 2 (two) times daily. 10/27/19  Yes Alver Sorrow, NP  fluticasone (FLONASE) 50 MCG/ACT nasal spray Place 1-2 sprays into both nostrils daily as needed for allergies.    Yes [provider]  gabapentin (NEURONTIN) 300 MG capsule Take 600 mg by mouth at bedtime.   Yes [provider]  glimepiride (AMARYL) 2 MG tablet Take 2 mg by mouth daily with breakfast.   Yes [provider]  insulin detemir (LEVEMIR) 100 UNIT/ML injection Inject 27 Units into the skin daily.    Yes [provider]  levothyroxine (SYNTHROID) 88 MCG tablet Take by mouth. 12/07/19 12/06/20 Yes [provider]  metFORMIN (GLUCOPHAGE) 1000 MG tablet Take 1 tablet (1,000 mg total) by mouth 2 (two) times daily with a meal. Resume on 10/21/19. 10/19/19  Yes Duke, Roe Rutherford, PA  pantoprazole (PROTONIX) 40 MG tablet Take 40 mg by mouth daily.    Yes [provider]  sertraline (ZOLOFT) 100 MG tablet Take 100 mg by mouth daily.   Yes [provider]  tiZANidine (ZANAFLEX) 2 MG tablet Take 2 mg by mouth every 6 (six) hours as needed for muscle spasms.   Yes [provider]  glucose blood (ONETOUCH ULTRA) test strip USE 2 (TWO) TIMES DAILY. USE AS INSTRUCTED. 07/20/17   [provider]  nitroGLYCERIN (NITROSTAT) 0.4 MG SL tablet Place 1 tablet (0.4 mg total) under the tongue every 5 (five) minutes as needed for chest pain. 10/19/19 10/18/20  Duke, Roe Rutherford, PA  ondansetron (ZOFRAN-ODT) 4 MG disintegrating tablet Take 4 mg by mouth every 8 (eight) hours as needed for nausea or vomiting.    [provider]  Polyethyl Glycol-Propyl Glycol 0.4-0.3 % SOLN Place 1 drop into both eyes 3 (three) times daily as needed (for dry eyes).    [provider]   VITAMIN D PO Take 1 tablet by mouth daily.     [provider]    Allergies as of 06/26/2020 - Review Complete 03/29/2020  Allergen Reaction Noted   Simethicone Anaphylaxis and Hives 04/04/2019   Morphine and related Nausea And Vomiting 12/04/2015   Penicillins Hives 12/04/2015    Family History  Problem Relation Age of Onset   Breast cancer Maternal Aunt 30   Heart disease Mother    Heart attack Mother    Heart disease Father    Heart attack Father    Diabetes Father    Breast cancer Paternal Grandmother    Cancer Paternal Aunt     Social History   Socioeconomic History   Marital status: Married    Spouse name: Ranell Patrick   Number of children: Not on file   Years  of education: Not on file   Highest education level: Not on file  Occupational History   Not on file  Tobacco Use   Smoking status: Never   Smokeless tobacco: Never  Vaping Use   Vaping Use: Never used  Substance and Sexual Activity   Alcohol use: No   Drug use: Not Currently    Types: Hydrocodone   Sexual activity: Not Currently  Other Topics Concern   Not on file  Social History Narrative   Not on file   Social Determinants of Health   Financial Resource Strain: Not on file  Food Insecurity: Not on file  Transportation Needs: Not on file  Physical Activity: Not on file  Stress: Not on file  Social Connections: Not on file  Intimate Partner Violence: Not on file    Review of Systems: See HPI, otherwise negative ROS  Physical Exam: BP (!) 149/74   Pulse 62   Temp 97.7 F (36.5 C) (Temporal)   Resp 18   Ht 5' (1.524 m)   Wt 76 kg   SpO2 98%   BMI 32.71 kg/m  General:   Alert,  pleasant and cooperative in NAD Head:  Normocephalic and atraumatic. Lungs:  Clear to auscultation.    Heart:  Regular rate and rhythm.   Impression/Plan: [name] is here for ophthalmic surgery.  Risks, benefits, limitations, and alternatives regarding ophthalmic surgery have been reviewed with  the patient.  Questions have been answered.  All parties agreeable.   Lockie Mola, MD  09/11/2020, 10:03 AM

## 2020-09-11 NOTE — Transfer of Care (Signed)
Immediate Anesthesia Transfer of Care Note  Patient: Alexandria Marquez  Procedure(s) Performed: CATARACT EXTRACTION PHACO AND INTRAOCULAR LENS PLACEMENT (IOC) RIGHT DIABETIC (Right: Eye)  Patient Location: PACU  Anesthesia Type: MAC  Level of Consciousness: awake, alert  and patient cooperative  Airway and Oxygen Therapy: Patient Spontanous Breathing and Patient connected to supplemental oxygen  Post-op Assessment: Post-op Vital signs reviewed, Patient's Cardiovascular Status Stable, Respiratory Function Stable, Patent Airway and No signs of Nausea or vomiting  Post-op Vital Signs: Reviewed and stable  Complications: No notable events documented.

## 2020-09-11 NOTE — Anesthesia Preprocedure Evaluation (Signed)
Anesthesia Evaluation  Patient identified by MRN, date of birth, ID band Patient awake    Reviewed: Allergy & Precautions, H&P , NPO status , Patient's Chart, lab work & pertinent test results, reviewed documented beta blocker date and time   Airway Mallampati: II  TM Distance: >3 FB Neck ROM: full    Dental no notable dental hx.    Pulmonary sleep apnea ,    Pulmonary exam normal breath sounds clear to auscultation       Cardiovascular Exercise Tolerance: Good hypertension, + CAD   Rhythm:regular Rate:Normal     Neuro/Psych Anxiety Depression negative neurological ROS     GI/Hepatic Neg liver ROS, hiatal hernia, GERD  ,  Endo/Other  diabetes, Type 2Hypothyroidism   Renal/GU negative Renal ROS  negative genitourinary   Musculoskeletal   Abdominal   Peds  Hematology negative hematology ROS (+)   Anesthesia Other Findings   Reproductive/Obstetrics negative OB ROS                             Anesthesia Physical Anesthesia Plan  ASA: 3  Anesthesia Plan: MAC   Post-op Pain Management:    Induction:   PONV Risk Score and Plan:   Airway Management Planned:   Additional Equipment:   Intra-op Plan:   Post-operative Plan:   Informed Consent: I have reviewed the patients History and Physical, chart, labs and discussed the procedure including the risks, benefits and alternatives for the proposed anesthesia with the patient or authorized representative who has indicated his/her understanding and acceptance.     Dental Advisory Given  Plan Discussed with: CRNA and Anesthesiologist  Anesthesia Plan Comments:         Anesthesia Quick Evaluation

## 2020-09-11 NOTE — Op Note (Signed)
  LOCATION:  [facility]   PREOPERATIVE DIAGNOSIS:    Nuclear sclerotic cataract right eye. H25.11   POSTOPERATIVE DIAGNOSIS:  Nuclear sclerotic cataract right eye.     PROCEDURE:  Phacoemusification with posterior chamber intraocular lens placement of the right eye   ULTRASOUND TIME: Procedure(s) with comments: CATARACT EXTRACTION PHACO AND INTRAOCULAR LENS PLACEMENT (IOC) RIGHT DIABETIC (Right) - 4.88 1:01.8  LENS:   Implant Name Type Inv. Item Serial No. Manufacturer Lot No. LRB No. Used Action  LENS IOL TECNIS EYHANCE 15.0 - [id number] Intraocular Lens LENS IOL TECNIS EYHANCE 15.0 [id number] JOHNSON   Right 1 Implanted         SURGEON:  Deirdre Evener, MD   ANESTHESIA:  Topical with tetracaine drops and 2% Xylocaine jelly, augmented with 1% preservative-free intracameral lidocaine.    COMPLICATIONS:  None.   DESCRIPTION OF PROCEDURE:  The patient was identified in the holding room and transported to the operating room and placed in the supine position under the operating microscope.  The right eye was identified as the operative eye and it was prepped and draped in the usual sterile ophthalmic fashion.   A 1 millimeter clear-corneal paracentesis was made at the 12:00 position.  0.5 ml of preservative-free 1% lidocaine was injected into the anterior chamber. The anterior chamber was filled with Viscoat viscoelastic.  A 2.4 millimeter keratome was used to make a near-clear corneal incision at the 9:00 position.  A curvilinear capsulorrhexis was made with a cystotome and capsulorrhexis forceps.  Balanced salt solution was used to hydrodissect and hydrodelineate the nucleus.   Phacoemulsification was then used in stop and chop fashion to remove the lens nucleus and epinucleus.  The remaining cortex was then removed using the irrigation and aspiration handpiece. Provisc was then placed into the capsular bag to distend it for lens placement.  A lens was then injected  into the capsular bag.  The remaining viscoelastic was aspirated.   Wounds were hydrated with balanced salt solution.  The anterior chamber was inflated to a physiologic pressure with balanced salt solution.  No wound leaks were noted. Cefuroxime 0.1 ml of a 10mg /ml solution was injected into the anterior chamber for a dose of 1 mg of intracameral antibiotic at the completion of the case.   Timolol and Brimonidine drops were applied to the eye.  The patient was taken to the recovery room in stable condition without complications of anesthesia or surgery.   Ellarie Picking 09/11/2020, 11:32 AM

## 2020-09-25 ENCOUNTER — Encounter: Payer: Self-pay | Admitting: Ophthalmology

## 2020-09-27 ENCOUNTER — Other Ambulatory Visit: Payer: Self-pay | Admitting: Family

## 2020-10-04 ENCOUNTER — Encounter: Payer: Self-pay | Admitting: *Deleted

## 2020-10-04 ENCOUNTER — Encounter: Payer: Self-pay | Admitting: Nurse Practitioner

## 2020-10-04 ENCOUNTER — Ambulatory Visit (INDEPENDENT_AMBULATORY_CARE_PROVIDER_SITE_OTHER): Payer: Medicare Other | Admitting: Nurse Practitioner

## 2020-10-04 ENCOUNTER — Other Ambulatory Visit: Payer: Self-pay

## 2020-10-04 VITALS — BP 160/84 | HR 58 | Ht 60.0 in | Wt 169.0 lb

## 2020-10-04 DIAGNOSIS — R9431 Abnormal electrocardiogram [ECG] [EKG]: Secondary | ICD-10-CM

## 2020-10-04 DIAGNOSIS — E785 Hyperlipidemia, unspecified: Secondary | ICD-10-CM | POA: Diagnosis not present

## 2020-10-04 DIAGNOSIS — I5032 Chronic diastolic (congestive) heart failure: Secondary | ICD-10-CM

## 2020-10-04 DIAGNOSIS — I739 Peripheral vascular disease, unspecified: Secondary | ICD-10-CM

## 2020-10-04 DIAGNOSIS — I25119 Atherosclerotic heart disease of native coronary artery with unspecified angina pectoris: Secondary | ICD-10-CM | POA: Diagnosis not present

## 2020-10-04 DIAGNOSIS — I2510 Atherosclerotic heart disease of native coronary artery without angina pectoris: Secondary | ICD-10-CM | POA: Diagnosis not present

## 2020-10-04 DIAGNOSIS — I10 Essential (primary) hypertension: Secondary | ICD-10-CM | POA: Diagnosis not present

## 2020-10-04 MED ORDER — ENALAPRIL MALEATE 5 MG PO TABS: 5.0000 mg | ORAL_TABLET | Freq: Two times a day (BID) | ORAL | 3 refills | Status: DC

## 2020-10-04 MED ORDER — EZETIMIBE 10 MG PO TABS: 10.0000 mg | ORAL_TABLET | Freq: Every day | ORAL | 3 refills | Status: DC
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
## 2020-10-04 NOTE — Patient Instructions (Signed)
Medication Instructions:  Your physician has recommended you make the following change in your medication:   INCREASE Enalapril to 5 mg twice a day  *If you need a refill on your cardiac medications before your next appointment, please call your pharmacy*   Lab Work: We need labs (CMET) in 1-2 weeks (Between July 22-29 th) at the [facility] at [facility] and go to the 1st desk on the right to check in, past the screening table. No appointment needed.  Lab hours: Monday- Friday (7:30 am- 5:30 pm)  If you have labs (blood work) drawn today and your tests are completely normal, you will receive your results only by: MyChart Message (if you have MyChart) OR A paper copy in the mail If you have any lab test that is abnormal or we need to change your treatment, we will call you to review the results.   Testing/Procedures: None   Follow-Up: At [facility], you and your health needs are our priority.  As part of our continuing mission to provide you with exceptional heart care, we have created designated Provider Care Teams.  These Care Teams include your primary Cardiologist (physician) and Advanced Practice Providers (APPs -  Physician Assistants and Nurse Practitioners) who all work together to provide you with the care you need, when you need it.   Your next appointment:   6 month(s)  The format for your next appointment:   In Person  Provider:   Lorine Bears, MD or Nicolasa Ducking, NP

## 2020-10-05 NOTE — Addendum Note (Signed)
Addended by: Ok Anis on: 10/05/2020 06:42 AM   Modules accepted: Orders

## 2020-10-07 ENCOUNTER — Telehealth: Payer: Self-pay | Admitting: Nurse Practitioner

## 2020-10-07 NOTE — Telephone Encounter (Signed)
   Spoke to pt this evening to discuss pending stress test in light of abnl ECG @ clinic visit on 7/15.  Pt remains asymptomatic.  The benefits (risk stratification, diagnosing coronary artery disease, treatment guidance) and risks [chest pain, dyspnea, cardiac arrhythmias, dizziness, low blood pressure, allergic reaction, heart attack, and life-threatening complications (estimated to be 1 in 10,000)] were discussed in detail with Alexandria Marquez and she agrees to proceed.  Caller verbalized understanding and was grateful for the call.  Nicolasa Ducking, NP 10/07/2020, 5:55 PM

## 2020-10-08 NOTE — Telephone Encounter (Signed)
Spoke with patient and reviewed that we are ordering stress test for her. Inquired what days work best for her and diabetes medication. She stated that the only 2 days that would not work would be 7/21 & 7/28. She takes glimepiride, metformin, and Levemir daily. Also checked to see if she has My Chart access and if I could send instructions there as well. She confirmed access and that I can send to her as well. Advised that I would get testing scheduled and call her back with this information.

## 2020-10-08 NOTE — Telephone Encounter (Signed)
Spoke with patient and reviewed Lexiscan appointment, instructions, and sent that information to her My Chart. Instructed her to call back or send My Chart message if she should have any questions. She was appreciative for the call back with no further questions at this time.

## 2020-10-11 ENCOUNTER — Other Ambulatory Visit: Payer: Self-pay

## 2020-10-11 ENCOUNTER — Encounter
Admission: RE | Admit: 2020-10-11 | Discharge: 2020-10-11 | Disposition: A | Discharge status: Home / Self Care | Payer: Medicare Other | Source: Ambulatory Visit | Attending: Nurse Practitioner | Admitting: Nurse Practitioner

## 2020-10-11 DIAGNOSIS — R9431 Abnormal electrocardiogram [ECG] [EKG]: Secondary | ICD-10-CM

## 2020-10-11 LAB — NM MYOCAR MULTI W/SPECT W/WALL MOTION / EF
LV dias vol: 45 mL (ref 46–106)
LV sys vol: 12 mL
MPHR: 145 {beats}/min
Peak HR: 85 {beats}/min
Percent HR: 58 %
Rest HR: 61 {beats}/min
SDS: 0
SRS: 0
SSS: 0
TID: 0.97

## 2020-10-11 MED ORDER — REGADENOSON 0.4 MG/5ML IV SOLN
0.4000 mg | Freq: Once | INTRAVENOUS | Status: AC
  Administered 2020-10-11: 0.4 mg via INTRAVENOUS

## 2020-10-11 MED ORDER — TECHNETIUM TC 99M TETROFOSMIN IV KIT
10.0000 | PACK | Freq: Once | INTRAVENOUS | Status: AC | PRN
  Administered 2020-10-11: 10.32 via INTRAVENOUS

## 2020-10-11 MED ORDER — TECHNETIUM TC 99M TETROFOSMIN IV KIT
30.0000 | PACK | Freq: Once | INTRAVENOUS | Status: AC | PRN
  Administered 2020-10-11: 32.2 via INTRAVENOUS

## 2020-10-17 ENCOUNTER — Other Ambulatory Visit: Payer: Self-pay

## 2020-10-17 ENCOUNTER — Other Ambulatory Visit
Admission: RE | Admit: 2020-10-17 | Discharge: 2020-10-17 | Disposition: A | Discharge status: Home / Self Care | Payer: Medicare Other | Attending: Nurse Practitioner | Admitting: Nurse Practitioner

## 2020-10-17 DIAGNOSIS — I2510 Atherosclerotic heart disease of native coronary artery without angina pectoris: Secondary | ICD-10-CM | POA: Insufficient documentation

## 2020-10-17 LAB — COMPREHENSIVE METABOLIC PANEL
ALT: 14 U/L (ref 0–44)
AST: 18 U/L (ref 15–41)
Albumin: 4.3 g/dL (ref 3.5–5.0)
Alkaline Phosphatase: 56 U/L (ref 38–126)
Anion gap: 9 (ref 5–15)
BUN: 23 mg/dL (ref 8–23)
CO2: 28 mmol/L (ref 22–32)
Calcium: 9.5 mg/dL (ref 8.9–10.3)
Chloride: 105 mmol/L (ref 98–111)
Creatinine, Ser: 1.21 mg/dL — ABNORMAL HIGH (ref 0.44–1.00)
GFR, Estimated: 47 mL/min — ABNORMAL LOW (ref >60)
Glucose, Bld: 124 mg/dL — ABNORMAL HIGH (ref 70–99)
Potassium: 4.5 mmol/L (ref 3.5–5.1)
Sodium: 142 mmol/L (ref 135–145)
Total Bilirubin: 0.5 mg/dL (ref 0.3–1.2)
Total Protein: 7.5 g/dL (ref 6.5–8.1)

## 2020-10-18 ENCOUNTER — Telehealth: Payer: Self-pay | Admitting: *Deleted

## 2020-10-18 MED ORDER — LISINOPRIL 20 MG PO TABS: 20.0000 mg | ORAL_TABLET | Freq: Every day | ORAL | 3 refills | Status: DC

## 2020-10-18 MED ORDER — ENALAPRIL MALEATE 20 MG PO TABS: 20.0000 mg | ORAL_TABLET | Freq: Two times a day (BID) | ORAL | 3 refills | Status: DC

## 2020-10-18 NOTE — Telephone Encounter (Signed)
-----   Message from Creig Hines, NP sent at 10/17/2020  3:23 PM EDT ----- Kidney function and lytes are stable on current dose of enalapril.

## 2020-10-18 NOTE — Telephone Encounter (Signed)
Called and spoke with patients pharmacy to request they take out any other prescriptions for her Enalapril. Sent in updated prescription for Enalapril 20 mg twice a day. Called pharmacy back due to entry error and confirmed medication sent in to make sure they update her list. All was updated and reviewed.

## 2020-10-18 NOTE — Telephone Encounter (Signed)
Spoke with patient to review dosing of her enalapril. Instructed her to continue taking Enalapril 20 mg twice a day. Reviewed that I would also call her pharmacy to cancel any other dosages they may have in their system. She verbalized understanding with no further questions at this time.

## 2020-10-18 NOTE — Telephone Encounter (Signed)
It appears that she is tolerating that dose just fine.

## 2020-10-18 NOTE — Telephone Encounter (Signed)
Reviewed results and recommendations with patient. She was filling her pill box the other day and noticed the bottle had 20 mg on label. So she has in fact been taking enalapril 20 mg twice daily. Advised I would update provider on this and give her a call back with his recommendations. She verbalized understanding with no further questions at this time.

## 2020-10-25 ENCOUNTER — Telehealth: Payer: Self-pay | Admitting: Nurse Practitioner

## 2020-10-25 NOTE — Telephone Encounter (Signed)
    Patient Name: Alexandria Marquez  DOB: 07-13-1945 MRN: [id number]  Primary Cardiologist: Lorine Bears, MD  Chart reviewed as part of pre-operative protocol coverage. Given past medical history and time since last visit, based on ACC/AHA guidelines, Alexandria Marquez would be at acceptable risk for the planned procedure without further cardiovascular testing.   The patient was seen for pre-operative clearance 10/04/20 at which time the patient was doing well however there were new TWI noted on EKG. She underwent Lexiscan stress testing 10/11/20 that showed no ischemia or infarct, considered a low risk study. It has been greater than 1 year since stent intervention therefore she may hold Plavix 3-5 days prior to procedure then resume soon after per procedural team.   The patient was advised that if she develops new symptoms prior to surgery to contact our office to arrange for a follow-up visit, and she verbalized understanding.  I will route this recommendation to the requesting party via Epic fax function and remove from pre-op pool.  Please call with questions.  Georgie Chard, NP 10/25/2020, 3:52 PM

## 2020-10-25 NOTE — Telephone Encounter (Signed)
   [facility] Pre-operative Risk Assessment    Patient Name: Alexandria Marquez  DOB: [birth year] MRN: [id number]  HEARTCARE STAFF:  - IMPORTANT!!!!!! Under Visit Info/Reason for Call, type in Other and utilize the format Clearance MM/DD/YY or Clearance TBD. Do not use dashes or single digits. - Please review there is not already an duplicate clearance open for this procedure. - If request is for dental extraction, please clarify the # of teeth to be extracted. - If the patient is currently at the dentist's office, call Pre-Op Callback Staff (MA/nurse) to input urgent request.  - If the patient is not currently in the dentist office, please route to the Pre-Op pool.  Request for surgical clearance:  What type of surgery is being performed? UPPER ENDOSCOPY  When is this surgery scheduled? 11/04/20  What type of clearance is required (medical clearance vs. Pharmacy clearance to hold med vs. Both)? BOTH  Are there any medications that need to be held prior to surgery and how long? PLAVIX   Practice name and name of physician performing surgery? [facility]  What is the office phone number? [phone]   7.   What is the office fax number? [phone]  8.   Anesthesia type (None, local, MAC, general) ? GENERAL  Eli Phillips 10/25/2020, 3:22 PM  _________________________________________________________________   (provider comments below)

## 2020-10-30 ENCOUNTER — Other Ambulatory Visit: Payer: Self-pay | Admitting: Nurse Practitioner

## 2020-11-04 ENCOUNTER — Encounter: Payer: Self-pay | Admitting: Emergency Medicine

## 2020-11-05 ENCOUNTER — Other Ambulatory Visit: Payer: Self-pay

## 2020-11-05 ENCOUNTER — Encounter: Payer: Self-pay | Admitting: *Deleted

## 2020-11-05 ENCOUNTER — Ambulatory Visit: Payer: Medicare Other | Admitting: Certified Registered"

## 2020-11-05 ENCOUNTER — Ambulatory Visit
Admission: RE | Admit: 2020-11-05 | Discharge: 2020-11-05 | Disposition: A | Discharge status: Home / Self Care | Payer: Medicare Other | Attending: Gastroenterology | Admitting: Gastroenterology

## 2020-11-05 ENCOUNTER — Encounter
Admission: RE | Disposition: A | Discharge status: Home / Self Care | Payer: Self-pay | Source: Home / Self Care | Attending: Gastroenterology

## 2020-11-05 DIAGNOSIS — Z794 Long term (current) use of insulin: Secondary | ICD-10-CM | POA: Diagnosis not present

## 2020-11-05 DIAGNOSIS — I2510 Atherosclerotic heart disease of native coronary artery without angina pectoris: Secondary | ICD-10-CM | POA: Diagnosis not present

## 2020-11-05 DIAGNOSIS — Z885 Allergy status to narcotic agent status: Secondary | ICD-10-CM | POA: Insufficient documentation

## 2020-11-05 DIAGNOSIS — Z79890 Hormone replacement therapy (postmenopausal): Secondary | ICD-10-CM | POA: Diagnosis not present

## 2020-11-05 DIAGNOSIS — K3189 Other diseases of stomach and duodenum: Secondary | ICD-10-CM | POA: Diagnosis not present

## 2020-11-05 DIAGNOSIS — Z888 Allergy status to other drugs, medicaments and biological substances status: Secondary | ICD-10-CM | POA: Diagnosis not present

## 2020-11-05 DIAGNOSIS — K219 Gastro-esophageal reflux disease without esophagitis: Secondary | ICD-10-CM | POA: Diagnosis not present

## 2020-11-05 DIAGNOSIS — Z880 Allergy status to penicillin: Secondary | ICD-10-CM | POA: Insufficient documentation

## 2020-11-05 DIAGNOSIS — Z7902 Long term (current) use of antithrombotics/antiplatelets: Secondary | ICD-10-CM | POA: Diagnosis not present

## 2020-11-05 DIAGNOSIS — Z7982 Long term (current) use of aspirin: Secondary | ICD-10-CM | POA: Insufficient documentation

## 2020-11-05 DIAGNOSIS — Z7984 Long term (current) use of oral hypoglycemic drugs: Secondary | ICD-10-CM | POA: Diagnosis not present

## 2020-11-05 DIAGNOSIS — Z79899 Other long term (current) drug therapy: Secondary | ICD-10-CM | POA: Insufficient documentation

## 2020-11-05 HISTORY — PX: ESOPHAGOGASTRODUODENOSCOPY (EGD) WITH PROPOFOL: SHX5813

## 2020-11-05 SURGERY — ESOPHAGOGASTRODUODENOSCOPY (EGD) WITH PROPOFOL
Anesthesia: General

## 2020-11-05 MED ORDER — LIDOCAINE 2% (20 MG/ML) 5 ML SYRINGE
INTRAMUSCULAR | Status: DC | PRN
  Administered 2020-11-05: 20 mg via INTRAVENOUS

## 2020-11-05 MED ORDER — SODIUM CHLORIDE 0.9 % IV SOLN: INTRAVENOUS | Status: DC

## 2020-11-05 MED ORDER — PROPOFOL 10 MG/ML IV BOLUS
INTRAVENOUS | Status: DC | PRN
  Administered 2020-11-05: 50 mg via INTRAVENOUS
  Administered 2020-11-05 (×2): 20 mg via INTRAVENOUS

## 2020-11-05 MED ORDER — PROPOFOL 500 MG/50ML IV EMUL
INTRAVENOUS | Status: DC | PRN
  Administered 2020-11-05: 120 ug/kg/min via INTRAVENOUS

## 2020-11-05 NOTE — Transfer of Care (Signed)
Immediate Anesthesia Transfer of Care Note  Patient: Alexandria Marquez  Procedure(s) Performed: ESOPHAGOGASTRODUODENOSCOPY (EGD) WITH PROPOFOL  Patient Location: Endoscopy Unit  Anesthesia Type:General  Level of Consciousness: drowsy  Airway & Oxygen Therapy: Patient Spontanous Breathing  Post-op Assessment: Report given to RN and Post -op Vital signs reviewed and stable  Post vital signs: Reviewed  Last Vitals:  Vitals Value Taken Time  BP 96/55 11/05/20 0856  Temp    Pulse 53 11/05/20 0856  Resp 13 11/05/20 0856  SpO2 94 % 11/05/20 0856  Vitals shown include unvalidated device data.  Last Pain:  Vitals:   11/05/20 0856  PainSc: Asleep         Complications: No notable events documented.

## 2020-11-05 NOTE — Anesthesia Preprocedure Evaluation (Signed)
Anesthesia Evaluation  Patient identified by MRN, date of birth, ID band Patient awake    Reviewed: Allergy & Precautions, H&P , NPO status , Patient's Chart, lab work & pertinent test results, reviewed documented beta blocker date and time   Airway Mallampati: II  TM Distance: >3 FB Neck ROM: full    Dental no notable dental hx.    Pulmonary sleep apnea ,    Pulmonary exam normal breath sounds clear to auscultation       Cardiovascular Exercise Tolerance: Good hypertension, + CAD (s/p CABG 2008) and + Peripheral Vascular Disease (s/p SMA stent)   Rhythm:regular Rate:Normal     Neuro/Psych Anxiety Depression negative neurological ROS     GI/Hepatic Neg liver ROS, hiatal hernia, GERD  ,  Endo/Other  diabetes, Type 2Hypothyroidism   Renal/GU negative Renal ROS  negative genitourinary   Musculoskeletal   Abdominal (+) + obese,   Peds  Hematology negative hematology ROS (+)   Anesthesia Other Findings History of CAD status post CABG and subsequent RCA stenting, diastolic dysfunction, mild pulmonary hypertension, diabetes, peripheral arterial disease status post SMA stenting (March 2020), hypertension, hyperlipidemia, hypothyroidism, sleep apnea, depression, anxiety, and GERD.  Reproductive/Obstetrics negative OB ROS                            Anesthesia Physical  Anesthesia Plan  ASA: 3  Anesthesia Plan: General   Post-op Pain Management:    Induction:   PONV Risk Score and Plan: 3 and TIVA and Midazolam  Airway Management Planned:   Additional Equipment:   Intra-op Plan:   Post-operative Plan:   Informed Consent: I have reviewed the patients History and Physical, chart, labs and discussed the procedure including the risks, benefits and alternatives for the proposed anesthesia with the patient or authorized representative who has indicated his/her understanding and acceptance.      Dental Advisory Given  Plan Discussed with: CRNA and Anesthesiologist  Anesthesia Plan Comments:         Anesthesia Quick Evaluation

## 2020-11-05 NOTE — Interval H&P Note (Signed)
History and Physical Interval Note:  11/05/2020 8:40 AM  Alexandria Marquez  has presented today for surgery, with the diagnosis of GASTRIC NODULE.  The various methods of treatment have been discussed with the patient and family. After consideration of risks, benefits and other options for treatment, the patient has consented to  Procedure(s) with comments: ESOPHAGOGASTRODUODENOSCOPY (EGD) WITH PROPOFOL (N/A) - DM as a surgical intervention.  The patient's history has been reviewed, patient examined, no change in status, stable for surgery.  I have reviewed the patient's chart and labs.  Questions were answered to the patient's satisfaction.     Regis Bill  Ok to proceed with EGD

## 2020-11-05 NOTE — H&P (Signed)
Outpatient short stay form Pre-procedure 11/05/2020  Regis Bill, MD  Primary Physician: Patrice Paradise, MD  Reason for visit:  Merlyn Lot MD, MPH  History of present illness:   75 y/o lady with history of CAD on plavix with last dose 5 days ago here for EGD for dyspepsia. History of biopsies without h pylori. Duodenal biopsies with increased lymphocytes. Several abdominal surgeries including hysterectomy and hernia repair.    Current Facility-Administered Medications:    0.9 %  sodium chloride infusion, , Intravenous, Continuous, Timberlyn Pickford, Rossie Muskrat, MD, Last Rate: 20 mL/hr at 11/05/20 0823, New Bag at 11/05/20 0823  Medications Prior to Admission  Medication Sig Dispense Refill Last Dose   amLODipine (NORVASC) 5 MG tablet Take 1 tablet (5 mg total) by mouth 2 (two) times daily.   11/05/2020 at 0700   aspirin 81 MG tablet Take 1 tablet (81 mg total) by mouth daily. 30 tablet 0 11/04/2020 at 0800   atorvastatin (LIPITOR) 80 MG tablet Take 1 tablet (80 mg total) by mouth at bedtime. 90 tablet 3 11/04/2020 at 0800   bisoprolol (ZEBETA) 5 MG tablet Take 5 mg by mouth daily.   11/04/2020 at 0800   carvedilol (COREG) 3.125 MG tablet TAKE 1 TABLET BY MOUTH TWICE A DAY 60 tablet 5 11/05/2020 at 0700   clopidogrel (PLAVIX) 75 MG tablet Take 1 tablet (75 mg total) by mouth daily with breakfast. 90 tablet 3 10/30/2020   enalapril (VASOTEC) 20 MG tablet Take 1 tablet (20 mg total) by mouth 2 (two) times daily. 180 tablet 3 11/05/2020 at 0700   ezetimibe (ZETIA) 10 MG tablet Take 1 tablet (10 mg total) by mouth daily. 90 tablet 3 11/04/2020 at 0800   fluticasone (FLONASE) 50 MCG/ACT nasal spray Place 1-2 sprays into both nostrils daily as needed for allergies.    Past Week at 0   gabapentin (NEURONTIN) 300 MG capsule Take 600 mg by mouth at bedtime.   11/04/2020 at 0800   glimepiride (AMARYL) 2 MG tablet Take 2 mg by mouth daily with breakfast.   11/04/2020 at 0800   glucose blood (ONETOUCH  ULTRA) test strip USE 2 (TWO) TIMES DAILY. USE AS INSTRUCTED.   11/04/2020 at 0800   insulin detemir (LEVEMIR) 100 UNIT/ML injection Inject 27 Units into the skin daily.    11/04/2020 at 0800   levothyroxine (SYNTHROID) 88 MCG tablet Take by mouth.   11/05/2020 at 0700   metFORMIN (GLUCOPHAGE) 1000 MG tablet Take 1 tablet (1,000 mg total) by mouth 2 (two) times daily with a meal. Resume on 10/21/19.   11/04/2020 at 0800   ondansetron (ZOFRAN-ODT) 4 MG disintegrating tablet Take 4 mg by mouth every 8 (eight) hours as needed for nausea or vomiting.   Past Week   pantoprazole (PROTONIX) 40 MG tablet Take 40 mg by mouth daily.    11/04/2020 at 0800   Polyethyl Glycol-Propyl Glycol 0.4-0.3 % SOLN Place 1 drop into both eyes 3 (three) times daily as needed (for dry eyes).   11/04/2020   sertraline (ZOLOFT) 100 MG tablet Take 100 mg by mouth daily.   11/04/2020 at 08000   tiZANidine (ZANAFLEX) 2 MG tablet Take 2 mg by mouth every 6 (six) hours as needed for muscle spasms.   11/04/2020 at 0800   VITAMIN D PO Take 1 tablet by mouth daily.    11/04/2020 at 0800   nitroGLYCERIN (NITROSTAT) 0.4 MG SL tablet Place 1 tablet (0.4 mg total) under the tongue every 5 (  five) minutes as needed for chest pain. 25 tablet 3      Allergies  Allergen Reactions   Simethicone Anaphylaxis and Hives   Morphine And Related Nausea And Vomiting    Severe n & v. Any other pain medications are okay   Penicillins Hives    Has patient had a PCN reaction causing immediate rash, facial/tongue/throat swelling, SOB or lightheadedness with hypotension: No Has patient had a PCN reaction causing severe rash involving mucus membranes or skin necrosis: No Has patient had a PCN reaction that required hospitalization No Has patient had a PCN reaction occurring within the last 10 years: No If all of the above answers are "NO", then may proceed with Cephalosporin use.      Past Medical History:  Diagnosis Date   Anxiety    Arthritis     Asthma    when young   Coronary artery disease    a. 2008 s/p CABG x 2 (LIMA->LAD, VG->Diag); b. 09/2019 PCI: LM min irregs, LAD 100p/m, 85/26m, LCX 76m/d, RCA 59m (2.0x18 Resolute Onyx DES), VG->D2 ok, LIMA->LAD 50p/d. EF 55-65%.   Depression    Diabetes mellitus without complication (HCC)    Diastolic dysfunction    a. 08/2019 Echo: EF 60-65%, no rwma, mild LVH, Gr1 DD. RVSP 43.76mmHg. Mild MR, mild to mod TR. AoV sclerosis.   Environmental and seasonal allergies    GERD (gastroesophageal reflux disease)    History of hiatal hernia    Hyperlipidemia    Hypertension    Hypothyroid    Hypothyroidism    Lower extremity edema    Motion sickness    OSA (obstructive sleep apnea) 06/04/2014   Osteoarthritis    Scoliosis    Shortness of breath dyspnea    doe   Vertigo     Review of systems:  Otherwise negative.    Physical Exam  Gen: Alert, oriented. Appears stated age.  HEENT: PERRLA. Lungs: No respiratory distress CV: RRR Abd: soft, benign, no masses Ext: No edema    Planned procedures: Proceed with EGD. The patient understands the nature of the planned procedure, indications, risks, alternatives and potential complications including but not limited to bleeding, infection, perforation, damage to internal organs and possible oversedation/side effects from anesthesia. The patient agrees and gives consent to proceed.  Please refer to procedure notes for findings, recommendations and patient disposition/instructions.     Regis Bill, MD [facility]

## 2020-11-05 NOTE — Op Note (Signed)
[facility] Patient Name: Alexandria Marquez Procedure Date: 11/05/2020 8:22 AM MRN: [id number] Account #: [id number] Date of Birth: 10/19/1945 Admit Type: Outpatient Age: 75 Room: [facility] ENDO ROOM 1 Gender: Female Note Status: Finalized Procedure:             Upper GI endoscopy Indications:           Gastro-esophageal reflux disease Providers:             Andrey Farmer MD, MD Referring MD:          Precious Bard, MD (Referring MD) Medicines:             Monitored Anesthesia Care Complications:         No immediate complications. Estimated blood loss:                         Minimal. Procedure:             Pre-Anesthesia Assessment:                        - Prior to the procedure, a History and Physical was                         performed, and patient medications and allergies were                         reviewed. The patient is competent. The risks and                         benefits of the procedure and the sedation options and                         risks were discussed with the patient. All questions                         were answered and informed consent was obtained.                         Patient identification and proposed procedure were                         verified by the physician, the nurse, the anesthetist                         and the technician in the endoscopy suite. Mental                         Status Examination: alert and oriented. Airway                         Examination: normal oropharyngeal airway and neck                         mobility. Respiratory Examination: clear to                         auscultation. CV Examination: normal. Prophylactic  Antibiotics: The patient does not require prophylactic                         antibiotics. Prior Anticoagulants: The patient has                         taken no previous anticoagulant or antiplatelet                         agents. ASA Grade  Assessment: III - A patient with                         severe systemic disease. After reviewing the risks and                         benefits, the patient was deemed in satisfactory                         condition to undergo the procedure. The anesthesia                         plan was to use monitored anesthesia care (MAC).                         Immediately prior to administration of medications,                         the patient was re-assessed for adequacy to receive                         sedatives. The heart rate, respiratory rate, oxygen                         saturations, blood pressure, adequacy of pulmonary                         ventilation, and response to care were monitored                         throughout the procedure. The physical status of the                         patient was re-assessed after the procedure.                        After obtaining informed consent, the endoscope was                         passed under direct vision. Throughout the procedure,                         the patient's blood pressure, pulse, and oxygen                         saturations were monitored continuously. The Endoscope                         was introduced through the mouth, and advanced to the  second part of duodenum. The upper GI endoscopy was                         accomplished without difficulty. The patient tolerated                         the procedure well. Findings:      The examined esophagus was normal.      A single 7 mm submucosal papule (nodule) with no bleeding and no       stigmata of recent bleeding was found on the greater curvature of the       stomach. Biopsies were taken with a cold forceps for histology.       Estimated blood loss was minimal.      The exam of the stomach was otherwise normal.      The examined duodenum was normal. Biopsies for histology were taken with       a cold forceps for evaluation of celiac  disease. Estimated blood loss       was minimal. Impression:            - Normal esophagus.                        - A single submucosal papule (nodule) found in the                         stomach. Biopsied.                        - Normal examined duodenum. Biopsied. Recommendation:        - Discharge patient to home.                        - Resume previous diet.                        - Continue present medications.                        - Await pathology results.                        - Return to referring physician as previously                         scheduled. Procedure Code(s):     --- Professional ---                        [phone], Esophagogastroduodenoscopy, flexible,                         transoral; with biopsy, single or multiple Diagnosis Code(s):     --- Professional ---                        K31.89, Other diseases of stomach and duodenum                        K21.9, Gastro-esophageal reflux disease without  esophagitis CPT copyright 2019 American Medical Association. All rights reserved. The codes documented in this report are preliminary and upon coder review may  be revised to meet current compliance requirements. Andrey Farmer MD, MD 11/05/2020 8:55:44 AM Number of Addenda: 0 Note Initiated On: 11/05/2020 8:22 AM Estimated Blood Loss:  Estimated blood loss was minimal.      [facility]

## 2020-11-05 NOTE — Anesthesia Postprocedure Evaluation (Signed)
Anesthesia Post Note  Patient: Alexandria Marquez  Procedure(s) Performed: ESOPHAGOGASTRODUODENOSCOPY (EGD) WITH PROPOFOL  Patient location during evaluation: Endoscopy Anesthesia Type: General Level of consciousness: awake and alert Pain management: pain level controlled Vital Signs Assessment: post-procedure vital signs reviewed and stable Respiratory status: spontaneous breathing, nonlabored ventilation and respiratory function stable Cardiovascular status: blood pressure returned to baseline and stable Postop Assessment: no apparent nausea or vomiting Anesthetic complications: no   No notable events documented.   Last Vitals:  Vitals:   11/05/20 0906 11/05/20 0916  BP: (!) 130/59 (!) 170/73  Pulse: (!) 51 60  Resp: 13 12  Temp:    SpO2: 95% 96%    Last Pain:  Vitals:   11/05/20 0916  PainSc: 0-No pain                 Foye Deer

## 2020-11-06 ENCOUNTER — Encounter: Payer: Self-pay | Admitting: Gastroenterology

## 2020-11-06 LAB — SURGICAL PATHOLOGY

## 2020-12-04 ENCOUNTER — Telehealth: Payer: Self-pay | Admitting: Nurse Practitioner

## 2020-12-04 NOTE — Telephone Encounter (Signed)
Patient would like to discuss her Enalapril. States she would like to decrease the mg due to her feeling lightheaded and her balance is off.

## 2020-12-04 NOTE — Telephone Encounter (Signed)
Was able to reach out to Alexandria Marquez regarding her enalapril. She reports since July she has been having spells of "off-balance" and lightheadedness, seems to be happening more and thinks it is do to the increase in the enalapril.  In the past she had been on enalapril 2.5 mg daily. In July it was increased to 5 mg by Alexandria Shields, NP Then increased to 20 mg later in July by Alexandria Givens, NP  She has not taken her enalapril today and has not complaints of symptoms. She is requesting if she can come off the 20 mg to a lower dose.  Currently does not have an BP numbers to report.  Advised will send message to Alexandria Givens, NP to see what he recommends. Alexandria Marquez is very thankful and will await return call with instructions.

## 2020-12-05 MED ORDER — ENALAPRIL MALEATE 20 MG PO TABS: 10.0000 mg | ORAL_TABLET | Freq: Two times a day (BID) | ORAL | 3 refills | Status: DC

## 2020-12-05 NOTE — Telephone Encounter (Signed)
To clarify - pt was advised to increase enalapril to 5mg  BID on 7/15 based on our records of her dose at that time.  She informed 8/15 on 7/29 that she actually had 20mg  tabs and was taking 20mg  BID.  Labs were stable and she was advised to continue that dose.  If she is feeling lightheaded, she should reduce to 10mg  BID.

## 2020-12-05 NOTE — Addendum Note (Signed)
Addended by: Lanny Hurst on: 12/05/2020 12:54 PM   Modules accepted: Orders

## 2020-12-05 NOTE — Telephone Encounter (Signed)
Spoke to pt.  Notified of provider's recc.  Pt voiced understanding.  She will decr Enalapril to 10 mg BID.  Med list updated. Advised pt to call office with recurrent s/s.   Pt appreciative and has no further questions.

## 2020-12-08 ENCOUNTER — Other Ambulatory Visit: Payer: Self-pay | Admitting: Family

## 2020-12-09 NOTE — Telephone Encounter (Signed)
Rx(s) sent to pharmacy electronically.  

## 2021-04-01 ENCOUNTER — Other Ambulatory Visit: Payer: Self-pay | Admitting: Physician Assistant

## 2021-04-01 DIAGNOSIS — Z1231 Encounter for screening mammogram for malignant neoplasm of breast: Secondary | ICD-10-CM

## 2021-04-29 ENCOUNTER — Other Ambulatory Visit: Payer: Self-pay

## 2021-04-29 ENCOUNTER — Encounter: Payer: Self-pay | Admitting: Nurse Practitioner

## 2021-04-29 ENCOUNTER — Ambulatory Visit (INDEPENDENT_AMBULATORY_CARE_PROVIDER_SITE_OTHER): Payer: Medicare Other | Admitting: Nurse Practitioner

## 2021-04-29 VITALS — BP 138/88 | HR 56 | Ht 60.0 in | Wt 170.0 lb

## 2021-04-29 DIAGNOSIS — I10 Essential (primary) hypertension: Secondary | ICD-10-CM

## 2021-04-29 DIAGNOSIS — E785 Hyperlipidemia, unspecified: Secondary | ICD-10-CM

## 2021-04-29 DIAGNOSIS — I739 Peripheral vascular disease, unspecified: Secondary | ICD-10-CM

## 2021-04-29 DIAGNOSIS — I5032 Chronic diastolic (congestive) heart failure: Secondary | ICD-10-CM | POA: Diagnosis not present

## 2021-04-29 DIAGNOSIS — I2510 Atherosclerotic heart disease of native coronary artery without angina pectoris: Secondary | ICD-10-CM

## 2021-04-29 DIAGNOSIS — E039 Hypothyroidism, unspecified: Secondary | ICD-10-CM

## 2021-04-29 DIAGNOSIS — E119 Type 2 diabetes mellitus without complications: Secondary | ICD-10-CM

## 2021-04-29 NOTE — Patient Instructions (Signed)
Medication Instructions:  Your physician recommends that you continue on your current medications as directed. Please refer to the Current Medication list given to you today.  *If you need a refill on your cardiac medications before your next appointment, please call your pharmacy*   Lab Work: None If you have labs (blood work) drawn today and your tests are completely normal, you will receive your results only by: MyChart Message (if you have MyChart) OR A paper copy in the mail If you have any lab test that is abnormal or we need to change your treatment, we will call you to review the results.  Follow-Up: At [facility], you and your health needs are our priority.  As part of our continuing mission to provide you with exceptional heart care, we have created designated Provider Care Teams.  These Care Teams include your primary Cardiologist (physician) and Advanced Practice Providers (APPs -  Physician Assistants and Nurse Practitioners) who all work together to provide you with the care you need, when you need it.   Your next appointment:   6 month(s)  The format for your next appointment:   In Person  Provider:   Lorine Bears, MD or Nicolasa Ducking, NP{

## 2021-04-29 NOTE — Progress Notes (Signed)
Office Visit    Patient Name: Alexandria Marquez Date of Encounter: 04/29/2021  Primary Care Provider:  Marinda Elk, MD Primary Cardiologist:  Kathlyn Sacramento, MD  Chief Complaint    76 year old female with a history of CAD status post CABG and subsequent RCA stenting, diastolic dysfunction, mild pulmonary hypertension, diabetes, peripheral arterial disease status post SMA stenting (March 2020), hypertension, hyperlipidemia, hypothyroidism, sleep apnea, depression, anxiety, and GERD, who presents for follow-up of CAD.  Past Medical History    Past Medical History:  Diagnosis Date   Anxiety    Arthritis    Asthma    when young   Coronary artery disease    a. 2008 s/p CABG x 2 (LIMA->LAD, VG->Diag); b. 09/2019 PCI: LM min irregs, LAD 100p/m, 85/25m, LCX 67m/d, RCA 72m (2.0x18 Resolute Onyx DES), VG->D2 ok, LIMA->LAD 50p/d. EF 55-65%; c. 09/2020 MV: EF 83%, no isch/infarct.   Depression    Diabetes mellitus without complication ([place])    Diastolic dysfunction    a. 08/2019 Echo: EF 60-65%, no rwma, mild LVH, Gr1 DD. RVSP 43.75mmHg. Mild MR, mild to mod TR. AoV sclerosis.   Environmental and seasonal allergies    GERD (gastroesophageal reflux disease)    History of hiatal hernia    Hyperlipidemia    Hypertension    Hypothyroid    Hypothyroidism    Lower extremity edema    Motion sickness    OSA (obstructive sleep apnea) 06/04/2014   Osteoarthritis    Scoliosis    Shortness of breath dyspnea    doe   Vertigo    Past Surgical History:  Procedure Laterality Date   ABDOMINAL HYSTERECTOMY     APPENDECTOMY     BACK SURGERY  2014   lumbar fusion/plate/rods/screws   CARDIAC CATHETERIZATION     CATARACT EXTRACTION W/PHACO Left 08/28/2020   Procedure: CATARACT EXTRACTION PHACO AND INTRAOCULAR LENS PLACEMENT ([place]) LEFT DIABETIC;  Surgeon: Leandrew Koyanagi, MD;  Location: [facility];  Service: Ophthalmology;  Laterality: Left;  6.59 0:53.9 12.2%   CATARACT EXTRACTION  W/PHACO Right 09/11/2020   Procedure: CATARACT EXTRACTION PHACO AND INTRAOCULAR LENS PLACEMENT ([place]) RIGHT DIABETIC;  Surgeon: Leandrew Koyanagi, MD;  Location: [place];  Service: Ophthalmology;  Laterality: Right;  4.88 1:01.8   CHOLECYSTECTOMY  2005   COLONOSCOPY WITH PROPOFOL N/A 01/18/2017   Procedure: COLONOSCOPY WITH PROPOFOL;  Surgeon: Lollie Sails, MD;  Location: [facility];  Service: Endoscopy;  Laterality: N/A;   CORONARY ARTERY BYPASS GRAFT     2008 2 vessels   CORONARY ARTERY BYPASS GRAFT  2008   CORONARY STENT INTERVENTION  10/18/2019   CORONARY STENT INTERVENTION N/A 10/18/2019   Procedure: CORONARY STENT INTERVENTION;  Surgeon: Wellington Hampshire, MD;  Location: [place] [facility];  Service: Cardiovascular;  Laterality: N/A;   ESOPHAGOGASTRODUODENOSCOPY (EGD) WITH PROPOFOL N/A 05/16/2018   Procedure: ESOPHAGOGASTRODUODENOSCOPY (EGD) WITH PROPOFOL;  Surgeon: Lollie Sails, MD;  Location: [facility] END[facility];  Service: Endoscopy;  Laterality: N/A;   ESOPHAGOGASTRODUODENOSCOPY (EGD) WITH PROPOFOL N/A 11/05/2020   Procedure: ESOPHAGOGASTRODUODENOSCOPY (EGD) WITH PROPOFOL;  Surgeon: Lesly Rubenstein, MD;  Location: [facility] ENDOSCOPY;  Service: Endoscopy;  Laterality: N/A;  DM   FRACTURE SURGERY     HERNIA REPAIR  1987   incisional   JOINT REPLACEMENT Right 2017   knee   ORIF ANKLE FRACTURE Left 06/19/2016   Procedure: OPEN REDUCTION INTERNAL FIXATION (ORIF) ANKLE FRACTURE;  Surgeon: Thornton Park, MD;  Location: [facility];  Service: Orthopedics;  Laterality: Left;   rcr     RIGHT/LEFT HEART CATH AND CORONARY/GRAFT ANGIOGRAPHY N/A 10/18/2019   Procedure: RIGHT/LEFT HEART CATH AND CORONARY/GRAFT ANGIOGRAPHY;  Surgeon: Wellington Hampshire, MD;  Location: [place] [facility];  Service: Cardiovascular;  Laterality: N/A;   ROTATOR CUFF REPAIR Left 2013   TONSILLECTOMY     TOTAL KNEE ARTHROPLASTY Right 12/19/2015   Procedure: TOTAL KNEE ARTHROPLASTY;  Surgeon: Corky Mull, MD;  Location: [facility];  Service: Orthopedics;  Laterality: Right;   TUBAL LIGATION     VISCERAL ANGIOGRAPHY N/A 05/23/2018   Procedure: VISCERAL ANGIOGRAPHY;  Surgeon: Algernon Huxley, MD;  Location: [place] [facility];  Service: Cardiovascular;  Laterality: N/A;    Allergies  Allergies  Allergen Reactions   Simethicone Anaphylaxis and Hives   Morphine And Related Nausea And Vomiting    Severe n & v. Any other pain medications are okay   Penicillins Hives    Has patient had a PCN reaction causing immediate rash, facial/tongue/throat swelling, SOB or lightheadedness with hypotension: No Has patient had a PCN reaction causing severe rash involving mucus membranes or skin necrosis: No Has patient had a PCN reaction that required hospitalization No Has patient had a PCN reaction occurring within the last 10 years: No If all of the above answers are "NO", then may proceed with Cephalosporin use.     History of Present Illness    76 year old female with the above past medical history including CAD, diastolic dysfunction, mild pulmonary hypertension, diabetes, peripheral arterial disease status post SMA stenting (March 2020), hypertension, hyperlipidemia, hypothyroidism, sleep apnea, depression, anxiety, and GERD.  She underwent CABG x2 in 2008 at [facility].  In the setting of mesenteric ischemia, she underwent stenting of the superior mesenteric artery in March 2020, with improvement in abdominal symptoms.  In May 2021, she noted progressive exertional dyspnea and chest pain.  She underwent stress testing which was nonischemic.  Echocardiogram showed normal LV function and mild pulmonary hypertension.  She continued to have chest pain and dyspnea and subsequently underwent diagnostic catheterization in July 2021, revealing 2 of 2 patent grafts with new RCA stenosis, which was treated with a drug-eluting stent.  At office follow-up in January 2022, she reported fatigue and was noted to be  bradycardic, with rates in the 40s at home.  The recommendation was made for a beta-blocker holiday.  She was also noted to have mild elevation in TSH and reported that she was not taking levothyroxine as prescribed.  She resumed levothyroxine and continued beta-blocker with improvement in fatigue.  Ms. Gunnells was last seen in cardiology clinic in July 2022, at which time she was doing well but noted to have more pronounced lateral T wave inversion.  She was pending EGD at that time.  She underwent Lexiscan Myoview that showed normal LV function without significant ischemia.  Subsequent EGD showed a single submucosal gastric papule with normal esophagus and duodenum.  Since her last visit, she has been stable from a cardiac standpoint.  She has some degree of chronic stable dyspnea on exertion but denies chest pain.  She has had some life stress in the setting of her husband needing back surgery and he previously helping to take care of her, and now that rolls have reversed some.  She denies palpitations, PND, orthopnea, dizziness, syncope, edema, or early satiety.  Home Medications    Current Outpatient Medications  Medication Sig Dispense Refill   amLODipine (NORVASC) 5 MG tablet Take 1 tablet (  5 mg total) by mouth 2 (two) times daily.     aspirin 81 MG tablet Take 1 tablet (81 mg total) by mouth daily. 30 tablet 0   atorvastatin (LIPITOR) 80 MG tablet Take 1 tablet (80 mg total) by mouth at bedtime. 90 tablet 3   carvedilol (COREG) 3.125 MG tablet TAKE 1 TABLET BY MOUTH TWICE A DAY 60 tablet 5   clopidogrel (PLAVIX) 75 MG tablet TAKE 1 TABLET (75 MG TOTAL) BY MOUTH DAILY WITH BREAKFAST. 90 tablet 2   enalapril (VASOTEC) 20 MG tablet Take 0.5 tablets (10 mg total) by mouth 2 (two) times daily. 180 tablet 3   ezetimibe (ZETIA) 10 MG tablet Take 1 tablet (10 mg total) by mouth daily. 90 tablet 3   fluticasone (FLONASE) 50 MCG/ACT nasal spray Place 1-2 sprays into both nostrils daily as needed for  allergies.      gabapentin (NEURONTIN) 300 MG capsule Take 600 mg by mouth at bedtime.     glimepiride (AMARYL) 2 MG tablet Take 2 mg by mouth daily with breakfast.     glucose blood (ONETOUCH ULTRA) test strip USE 2 (TWO) TIMES DAILY. USE AS INSTRUCTED.     insulin detemir (LEVEMIR) 100 UNIT/ML injection Inject 27 Units into the skin daily.      levothyroxine (SYNTHROID) 88 MCG tablet Take by mouth.     metFORMIN (GLUCOPHAGE) 1000 MG tablet Take 1 tablet (1,000 mg total) by mouth 2 (two) times daily with a meal. Resume on 10/21/19.     nitroGLYCERIN (NITROSTAT) 0.4 MG SL tablet Place 1 tablet (0.4 mg total) under the tongue every 5 (five) minutes as needed for chest pain. 25 tablet 3   ondansetron (ZOFRAN-ODT) 4 MG disintegrating tablet Take 4 mg by mouth every 8 (eight) hours as needed for nausea or vomiting.     pantoprazole (PROTONIX) 40 MG tablet Take 40 mg by mouth daily.      Polyethyl Glycol-Propyl Glycol 0.4-0.3 % SOLN Place 1 drop into both eyes 3 (three) times daily as needed (for dry eyes).     sertraline (ZOLOFT) 100 MG tablet Take 100 mg by mouth daily.     tiZANidine (ZANAFLEX) 2 MG tablet Take 2 mg by mouth every 6 (six) hours as needed for muscle spasms.     VITAMIN D PO Take 1 tablet by mouth daily.  (Patient not taking: Reported on 04/29/2021)     No current facility-administered medications for this visit.     Review of Systems    Chronic, stable dyspnea exertion.  She denies chest pain, dyspnea, palpitations, PND, orthopnea, dizziness, syncope, edema, or early satiety.  All other systems reviewed and are otherwise negative except as noted above.    Physical Exam    VS:  BP 138/88 (BP Location: Left Arm, Patient Position: Sitting, Cuff Size: Large)    Pulse (!) 56    Ht 5' (1.524 m)    Wt 170 lb (77.1 kg)    SpO2 96%    BMI 33.20 kg/m  , BMI Body mass index is 33.2 kg/m.     GEN: Well nourished, well developed, in no acute distress. HEENT: normal. Neck: Supple, no JVD,  carotid bruits, or masses. Cardiac: RRR, 2/6 systolic murmur at the upper sternal borders, no rubs or gallops. No clubbing, cyanosis, edema.  Radials/PT 2+ and equal bilaterally.  Respiratory:  Respirations regular and unlabored, clear to auscultation bilaterally. GI: Soft, nontender, nondistended, BS + x 4. MS: no deformity or atrophy. Skin:  warm and dry, no rash. Neuro:  Strength and sensation are intact. Psych: Normal affect.  Accessory Clinical Findings    ECG personally reviewed by me today -sinus bradycardia, 56, incomplete right bundle branch block, with nonspecific T changes- no acute changes.  Labs dated March 25, 2021 from care everywhere -personally reviewed  Hemoglobin 13.0, hematocrit 40.0, WBC 8.8, platelets 299 Sodium 137, potassium 5.0, chloride 103, CO2 27, BUN 24, creatinine 1.2, glucose 214 Total bilirubin 0.5, Total protein 6.6, albumin 4.1 Hemoglobin A1c 9.2 Total cholesterol 145, triglycerides 131, HDL 50.4, LDL 68 TSH 9.116  Assessment & Plan    1.  Coronary artery disease: Status post CABG x2 in 2008 with drug-eluting stent placement to the RCA in July 2021.  She has residual 60% mid to distal circumflex disease.  Recent low risk Myoview in July 2022 in the setting of significant lateral ST depression and T wave inversion on ECG.  Twelve-lead today with nonspecific T changes.  She has chronic, stable dyspnea on exertion and no chest pain.  Continue aspirin, statin, beta-blocker, Plavix, ACE inhibitor, and Zetia.  2.  Essential hypertension: Relatively stable at 138/88.  She has not been checking at home and I have encouraged her to do so.  Continue beta-blocker, ACE inhibitor, and amlodipine therapy.  3.  Hyperlipidemia: LDL of 68 earlier this month.  Continue statin and Zetia.  4.  Chronic HFpEF: Stable, chronic dyspnea exertion.  Euvolemic on examination.  Heart rate and blood pressure stable.  5.  Type 2 diabetes mellitus: Poorly controlled with an A1c of  9.2 earlier this month.  Followed by internal medicine.  6.  Hypothyroidism: TSH mildly elevated earlier this month at 9.116.  On levothyroxine managed per internal medicine.  Prior history of skipping doses of levothyroxine.  7.  History of mesenteric ischemia/PAD: Asymptomatic.  Followed by vascular surgery.  8.  Disposition: Follow-up in clinic in 6 months or sooner if necessary.   Murray Hodgkins, NP 04/29/2021, 5:49 PM

## 2021-05-13 ENCOUNTER — Other Ambulatory Visit: Payer: Self-pay

## 2021-05-13 ENCOUNTER — Ambulatory Visit
Admission: RE | Admit: 2021-05-13 | Discharge: 2021-05-13 | Disposition: A | Discharge status: Home / Self Care | Payer: Medicare Other | Source: Ambulatory Visit | Attending: Physician Assistant | Admitting: Physician Assistant

## 2021-05-13 DIAGNOSIS — Z1231 Encounter for screening mammogram for malignant neoplasm of breast: Secondary | ICD-10-CM | POA: Insufficient documentation

## 2021-08-28 ENCOUNTER — Other Ambulatory Visit: Payer: Self-pay | Admitting: Nurse Practitioner

## 2021-10-21 ENCOUNTER — Encounter: Payer: Self-pay | Admitting: Cardiovascular Disease

## 2021-10-21 ENCOUNTER — Ambulatory Visit (INDEPENDENT_AMBULATORY_CARE_PROVIDER_SITE_OTHER): Payer: Medicare Other | Admitting: Cardiovascular Disease

## 2021-10-21 VITALS — BP 124/64 | HR 54 | Ht 60.0 in | Wt 164.5 lb

## 2021-10-21 DIAGNOSIS — E785 Hyperlipidemia, unspecified: Secondary | ICD-10-CM

## 2021-10-21 DIAGNOSIS — I10 Essential (primary) hypertension: Secondary | ICD-10-CM

## 2021-10-21 DIAGNOSIS — I2510 Atherosclerotic heart disease of native coronary artery without angina pectoris: Secondary | ICD-10-CM

## 2021-10-21 DIAGNOSIS — I25118 Atherosclerotic heart disease of native coronary artery with other forms of angina pectoris: Secondary | ICD-10-CM | POA: Diagnosis not present

## 2021-10-21 NOTE — Patient Instructions (Signed)
Medication Instructions:  Your physician has recommended you make the following change in your medication:   STOP Carvedilol  Continue your other medications as prescribed  *If you need a refill on your cardiac medications before your next appointment, please call your pharmacy*   Lab Work: None ordered If you have labs (blood work) drawn today and your tests are completely normal, you will receive your results only by: MyChart Message (if you have MyChart) OR A paper copy in the mail If you have any lab test that is abnormal or we need to change your treatment, we will call you to review the results.   Testing/Procedures: None ordered   Follow-Up: At [facility], you and your health needs are our priority.  As part of our continuing mission to provide you with exceptional heart care, we have created designated Provider Care Teams.  These Care Teams include your primary Cardiologist (physician) and Advanced Practice Providers (APPs -  Physician Assistants and Nurse Practitioners) who all work together to provide you with the care you need, when you need it.  We recommend signing up for the patient portal called "MyChart".  Sign up information is provided on this After Visit Summary.  MyChart is used to connect with patients for Virtual Visits (Telemedicine).  Patients are able to view lab/test results, encounter notes, upcoming appointments, etc.  Non-urgent messages can be sent to your provider as well.   To learn more about what you can do with MyChart, go to [email or web address].    Your next appointment:   Your physician wants you to follow-up in: 6 months You will receive a reminder letter in the mail two months in advance. If you don't receive a letter, please call our office to schedule the follow-up appointment.   The format for your next appointment:   In Person  Provider:   You may see Lorine Bears, MD or one of the following Advanced Practice Providers on your  designated Care Team:   Nicolasa Ducking, NP Eula Listen, PA-C Cadence Fransico Michael, [place]   Other Instructions N/A  Important Information About Sugar

## 2021-10-21 NOTE — Progress Notes (Signed)
Cardiology Office Note   Date:  10/21/2021   ID:  Alexandria, Marquez 1946/02/02, MRN [id number]  PCP:  Patrice Paradise, MD  Cardiologist:   Lorine Bears, MD   Chief Complaint  Patient presents with   Other    6 month f/u no complaints today. Meds reviewed verbally with pt.      History of Present Illness: Alexandria Marquez is a 76 y.o. female who presents for a follow-up visit regarding coronary artery disease.  She has chronic medical conditions that include essential hypertension, type 2 diabetes, SMA stent, essential hypertension, hyperlipidemia, hypothyroidism, sleep apnea, anxiety and GERD.   She had CABG in 2008 at [facility].  She had progressive angina in May 2021.  Echocardiogram showed normal LV systolic function with mild pulmonary hypertension.  She underwent cardiac catheterization in July 2021 which showed patent grafts with significant mid RCA stenosis which was not bypassed.  I performed successful PCI and 2 overlapped drug-eluting stent placement.  She had a subsequent Lexiscan Myoview in July 2022 which showed no evidence of ischemia.  She has been doing reasonably well and denies any chest pain.  She reports chronic exertional dyspnea and fatigue but she does not do much of exercise.  She does complain of dizziness especially when standing up.  She continues to be mildly bradycardic.    Past Medical History:  Diagnosis Date   Anxiety    Arthritis    Asthma    when young   Coronary artery disease    a. 2008 s/p CABG x 2 (LIMA->LAD, VG->Diag); b. 09/2019 PCI: LM min irregs, LAD 100p/m, 85/25m, LCX 64m/d, RCA 80m (2.0x18 Resolute Onyx DES), VG->D2 ok, LIMA->LAD 50p/d. EF 55-65%; c. 09/2020 MV: EF 83%, no isch/infarct.   Depression    Diabetes mellitus without complication (HCC)    Diastolic dysfunction    a. 08/2019 Echo: EF 60-65%, no rwma, mild LVH, Gr1 DD. RVSP 43.60mmHg. Mild MR, mild to mod TR. AoV sclerosis.   Environmental and seasonal allergies    GERD  (gastroesophageal reflux disease)    History of hiatal hernia    Hyperlipidemia    Hypertension    Hypothyroid    Hypothyroidism    Lower extremity edema    Motion sickness    OSA (obstructive sleep apnea) 06/04/2014   Osteoarthritis    Scoliosis    Shortness of breath dyspnea    doe   Vertigo     Past Surgical History:  Procedure Laterality Date   ABDOMINAL HYSTERECTOMY     APPENDECTOMY     BACK SURGERY  2014   lumbar fusion/plate/rods/screws   CARDIAC CATHETERIZATION     CATARACT EXTRACTION W/PHACO Left 08/28/2020   Procedure: CATARACT EXTRACTION PHACO AND INTRAOCULAR LENS PLACEMENT (IOC) LEFT DIABETIC;  Surgeon: Lockie Mola, MD;  Location: [facility];  Service: Ophthalmology;  Laterality: Left;  6.59 0:53.9 12.2%   CATARACT EXTRACTION W/PHACO Right 09/11/2020   Procedure: CATARACT EXTRACTION PHACO AND INTRAOCULAR LENS PLACEMENT (IOC) RIGHT DIABETIC;  Surgeon: Lockie Mola, MD;  Location: [facility];  Service: Ophthalmology;  Laterality: Right;  4.88 1:01.8   CHOLECYSTECTOMY  2005   COLONOSCOPY WITH PROPOFOL N/A 01/18/2017   Procedure: COLONOSCOPY WITH PROPOFOL;  Surgeon: Christena Deem, MD;  Location: [facility] END[facility]COPY;  Service: Endoscopy;  Laterality: N/A;   CORONARY ARTERY BYPASS GRAFT     2008 2 vessels   CORONARY ARTERY BYPASS GRAFT  2008   CORONARY STENT INTERVENTION  10/18/2019  CORONARY STENT INTERVENTION N/A 10/18/2019   Procedure: CORONARY STENT INTERVENTION;  Surgeon: Wellington Hampshire, MD;  Location: [place] [facility];  Service: Cardiovascular;  Laterality: N/A;   ESOPHAGOGASTRODUODENOSCOPY (EGD) WITH PROPOFOL N/A 05/16/2018   Procedure: ESOPHAGOGASTRODUODENOSCOPY (EGD) WITH PROPOFOL;  Surgeon: Lollie Sails, MD;  Location: [facility];  Service: Endoscopy;  Laterality: N/A;   ESOPHAGOGASTRODUODENOSCOPY (EGD) WITH PROPOFOL N/A 11/05/2020   Procedure: ESOPHAGOGASTRODUODENOSCOPY (EGD) WITH PROPOFOL;  Surgeon: Lesly Rubenstein, MD;  Location: [facility] ENDOSCOPY;  Service: Endoscopy;  Laterality: N/A;  DM   FRACTURE SURGERY     HERNIA REPAIR  1987   incisional   JOINT REPLACEMENT Right 2017   knee   ORIF ANKLE FRACTURE Left 06/19/2016   Procedure: OPEN REDUCTION INTERNAL FIXATION (ORIF) ANKLE FRACTURE;  Surgeon: Thornton Park, MD;  Location: [facility] ORS;  Service: Orthopedics;  Laterality: Left;   rcr     RIGHT/LEFT HEART CATH AND CORONARY/GRAFT ANGIOGRAPHY N/A 10/18/2019   Procedure: RIGHT/LEFT HEART CATH AND CORONARY/GRAFT ANGIOGRAPHY;  Surgeon: Wellington Hampshire, MD;  Location: [place] [facility];  Service: Cardiovascular;  Laterality: N/A;   ROTATOR CUFF REPAIR Left 2013   TONSILLECTOMY     TOTAL KNEE ARTHROPLASTY Right 12/19/2015   Procedure: TOTAL KNEE ARTHROPLASTY;  Surgeon: Corky Mull, MD;  Location: [facility] OR[facility];  Service: Orthopedics;  Laterality: Right;   TUBAL LIGATION     VISCERAL ANGIOGRAPHY N/A 05/23/2018   Procedure: VISCERAL ANGIOGRAPHY;  Surgeon: Algernon Huxley, MD;  Location: [place] [facility];  Service: Cardiovascular;  Laterality: N/A;     Current Outpatient Medications  Medication Sig Dispense Refill   amLODipine (NORVASC) 5 MG tablet Take 1 tablet (5 mg total) by mouth 2 (two) times daily.     aspirin 81 MG tablet Take 1 tablet (81 mg total) by mouth daily. 30 tablet 0   atorvastatin (LIPITOR) 80 MG tablet Take 1 tablet (80 mg total) by mouth at bedtime. 90 tablet 3   carvedilol (COREG) 3.125 MG tablet TAKE 1 TABLET BY MOUTH TWICE A DAY 180 tablet 3   clopidogrel (PLAVIX) 75 MG tablet TAKE 1 TABLET (75 MG TOTAL) BY MOUTH DAILY WITH BREAKFAST. 90 tablet 2   enalapril (VASOTEC) 20 MG tablet Take 0.5 tablets (10 mg total) by mouth 2 (two) times daily. 180 tablet 3   ezetimibe (ZETIA) 10 MG tablet Take 1 tablet (10 mg total) by mouth daily. 90 tablet 3   fluticasone (FLONASE) 50 MCG/ACT nasal spray Place 1-2 sprays into both nostrils daily as needed for allergies.      gabapentin (NEURONTIN)  300 MG capsule Take 600 mg by mouth at bedtime.     glimepiride (AMARYL) 2 MG tablet Take 2 mg by mouth daily with breakfast.     glucose blood (ONETOUCH ULTRA) test strip USE 2 (TWO) TIMES DAILY. USE AS INSTRUCTED.     insulin detemir (LEVEMIR) 100 UNIT/ML injection Inject 27 Units into the skin daily.      levothyroxine (SYNTHROID) 88 MCG tablet Take by mouth.     metFORMIN (GLUCOPHAGE) 1000 MG tablet Take 1 tablet (1,000 mg total) by mouth 2 (two) times daily with a meal. Resume on 10/21/19.     nitroGLYCERIN (NITROSTAT) 0.4 MG SL tablet Place 1 tablet (0.4 mg total) under the tongue every 5 (five) minutes as needed for chest pain. 25 tablet 3   ondansetron (ZOFRAN-ODT) 4 MG disintegrating tablet Take 4 mg by mouth every 8 (eight) hours as needed for nausea  or vomiting.     pantoprazole (PROTONIX) 40 MG tablet Take 40 mg by mouth daily.      Polyethyl Glycol-Propyl Glycol 0.4-0.3 % SOLN Place 1 drop into both eyes 3 (three) times daily as needed (for dry eyes).     sertraline (ZOLOFT) 100 MG tablet Take 100 mg by mouth daily.     tiZANidine (ZANAFLEX) 2 MG tablet Take 2 mg by mouth every 6 (six) hours as needed for muscle spasms.     VITAMIN D PO Take 1 tablet by mouth daily.     No current facility-administered medications for this visit.    Allergies:   Simethicone, Morphine and related, and Penicillins    Social History:  The patient  reports that she has never smoked. She has never used smokeless tobacco. She reports that she does not currently use drugs after having used the following drugs: Hydrocodone. She reports that she does not drink alcohol.   Family History:  The patient's family history includes Breast cancer in her paternal grandmother; Breast cancer (age of onset: 65) in her maternal aunt; Cancer in her paternal aunt; Diabetes in her father; Heart attack in her father and mother; Heart disease in her father and mother.    ROS:  Please see the history of present illness.    Otherwise, review of systems are positive for none.   All other systems are reviewed and negative.    PHYSICAL EXAM: VS:  BP 124/64 (BP Location: Left Arm, Patient Position: Sitting, Cuff Size: Normal)   Pulse (!) 54   Ht 5' (1.524 m)   Wt 164 lb 8 oz (74.6 kg)   SpO2 98%   BMI 32.13 kg/m  , BMI Body mass index is 32.13 kg/m. GEN: Well nourished, well developed, in no acute distress  HEENT: normal  Neck: no JVD, carotid bruits, or masses Cardiac: RRR; no rubs, or gallops,no edema .  2/6 systolic murmur in the aortic area. Respiratory:  clear to auscultation bilaterally, normal work of breathing GI: soft, nontender, nondistended, + BS MS: no deformity or atrophy  Skin: warm and dry, no rash Neuro:  Strength and sensation are intact Psych: euthymic mood, full affect   EKG:  EKG is ordered today. The ekg ordered today demonstrates sinus bradycardia with low voltage   Recent Labs: No results found for requested labs within last 365 days.    Lipid Panel    Component Value Date/Time   CHOL 131 10/19/2019 0416   TRIG 120 10/19/2019 0416   HDL 37 (L) 10/19/2019 0416   CHOLHDL 3.5 10/19/2019 0416   VLDL 24 10/19/2019 0416   LDLCALC 70 10/19/2019 0416   LDLDIRECT 75.0 12/11/2019 1436      Wt Readings from Last 3 Encounters:  10/21/21 164 lb 8 oz (74.6 kg)  04/29/21 170 lb (77.1 kg)  11/05/20 170 lb (77.1 kg)          08/10/2019    4:23 PM  PAD Screen  Previous PAD dx? No  Previous surgical procedure? No  Pain with walking? No  Feet/toe relief with dangling? No  Painful, non-healing ulcers? No  Extremities discolored? No      ASSESSMENT AND PLAN:  1. Coronary artery disease involving native coronary arteries without angina: She is overall doing reasonably well with no anginal symptoms.  I recommend continuing medical therapy.  Continue dual antiplatelet therapy for now given her cardiac history as well as SMA stent.    2. Essential hypertension: Blood  pressure is reasonably controlled on current medications.  Given continued bradycardia and dizziness, I elected to discontinue small dose carvedilol.  Her ejection fraction is known to be normal.  3. Hyperlipidemia: Continue high-dose atorvastatin and Zetia.  Most recent lipid profile in January showed an LDL of 68.  4.  History of SMA stent: Symptoms seem to be stable.  She has intermittent abdominal pain but does not seem to be in correlation with food.  Her weight has been stable.    Disposition:   FU with me in 6 months  Signed,  Kathlyn Sacramento, MD  10/21/2021 1:38 PM    [facility]

## 2021-11-04 ENCOUNTER — Other Ambulatory Visit: Payer: Self-pay | Admitting: Nurse Practitioner

## 2022-01-15 ENCOUNTER — Other Ambulatory Visit: Payer: Self-pay | Admitting: Nurse Practitioner

## 2022-01-19 ENCOUNTER — Encounter (INDEPENDENT_AMBULATORY_CARE_PROVIDER_SITE_OTHER): Payer: Self-pay

## 2022-02-02 ENCOUNTER — Other Ambulatory Visit: Payer: Self-pay | Admitting: Cardiovascular Disease

## 2022-04-24 ENCOUNTER — Other Ambulatory Visit: Payer: Self-pay | Admitting: Cardiovascular Disease

## 2022-04-27 NOTE — Telephone Encounter (Signed)
Please schedule F/U appointment for 90 day refills. Thank you!

## 2022-05-08 ENCOUNTER — Other Ambulatory Visit (INDEPENDENT_AMBULATORY_CARE_PROVIDER_SITE_OTHER): Payer: Self-pay | Admitting: Nurse Practitioner

## 2022-05-08 DIAGNOSIS — K551 Chronic vascular disorders of intestine: Secondary | ICD-10-CM

## 2022-05-13 ENCOUNTER — Encounter (INDEPENDENT_AMBULATORY_CARE_PROVIDER_SITE_OTHER): Payer: Self-pay | Admitting: Nurse Practitioner

## 2022-05-13 ENCOUNTER — Ambulatory Visit (INDEPENDENT_AMBULATORY_CARE_PROVIDER_SITE_OTHER): Payer: Medicare Other | Admitting: Nurse Practitioner

## 2022-05-13 ENCOUNTER — Ambulatory Visit (INDEPENDENT_AMBULATORY_CARE_PROVIDER_SITE_OTHER): Payer: Medicare Other

## 2022-05-13 VITALS — BP 167/71 | HR 61 | Resp 16 | Wt 163.8 lb

## 2022-05-13 DIAGNOSIS — K551 Chronic vascular disorders of intestine: Secondary | ICD-10-CM

## 2022-05-13 DIAGNOSIS — E785 Hyperlipidemia, unspecified: Secondary | ICD-10-CM | POA: Diagnosis not present

## 2022-05-13 DIAGNOSIS — I10 Essential (primary) hypertension: Secondary | ICD-10-CM | POA: Diagnosis not present

## 2022-05-13 NOTE — H&P (View-Only) (Signed)
Subjective:    Patient ID: Alexandria Marquez, female    DOB: 1945-04-27, 77 y.o.   MRN: [id number] Chief Complaint  Patient presents with   Follow-up    Ref Alexandria Marquez consult mesenteric ischemia    Alexandria Marquez is a 77 year old female that presents today for follow-up evaluation of her chronic vessel ischemia.  This was initially evaluated in 2020 but this was at the height of the pandemic and the patient was understandably concerned about being hospitalized and exposed to the virus.  We last saw the patient in 2021 and discussed performing an angiogram however she has recently had to care for her husband and and has not been able to move forward with angiogram.  She does continue to have issues with postprandial nausea, diarrhea as well as occasional abdominal pain.  She does have a history of a previously placed mesenteric stent.  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  Previously placed stent is patent with some elevated velocities however this is somewhat consistent with previous studies done in 2021.    Review of Systems  Gastrointestinal:  Positive for abdominal pain, diarrhea and nausea.  All other systems reviewed and are negative.      Objective:   Physical Exam Vitals reviewed.  HENT:     Head: Normocephalic.  Cardiovascular:     Rate and Rhythm: Normal rate.     Pulses:          Radial pulses are 2+ on the right side and 2+ on the left side.  Pulmonary:     Effort: Pulmonary effort is normal.  Skin:    General: Skin is warm and dry.  Neurological:     Mental Status: She is alert and oriented to person, place, and time.  Psychiatric:        Mood and Affect: Mood normal.        Behavior: Behavior normal.        Thought Content: Thought content normal.        Judgment: Judgment normal.     BP (!) 167/71 (BP Location: Left Arm)   Pulse 61   Resp 16   Wt 163 lb 12.8 oz (74.3 kg)   BMI 31.99 kg/m   Past Medical History:  Diagnosis  Date   Anxiety    Arthritis    Asthma    when young   Coronary artery disease    a. 2008 s/p CABG x 2 (LIMA->LAD, VG->Diag); b. 09/2019 PCI: LM min irregs, LAD 100p/m, 85/87m LCX 669m, RCA 8532m.0x18 Resolute Onyx DES), VG->D2 ok, LIMA->LAD 50p/d. EF 55-65%; c. 09/2020 MV: EF 83%, no isch/infarct.   Depression    Diabetes mellitus without complication ([facility]  Diastolic dysfunction    a. 08/2019 Echo: EF 60-65%, no rwma, mild LVH, Gr1 DD. RVSP 43.9mm75m Mild MR, mild to mod TR. AoV sclerosis.   Environmental and seasonal allergies    GERD (gastroesophageal reflux disease)    History of hiatal hernia    Hyperlipidemia    Hypertension    Hypothyroid    Hypothyroidism    Lower extremity edema    Motion sickness    OSA (obstructive sleep apnea) 06/04/2014   Osteoarthritis    Scoliosis    Shortness of breath dyspnea    doe   Vertigo     Social History   Socioeconomic History   Marital status: Married    Spouse name: Alexandria Marquez of children:  Not on file   Years of education: Not on file   Highest education level: Not on file  Occupational History   Not on file  Tobacco Use   Smoking status: Never   Smokeless tobacco: Never  Vaping Use   Vaping Use: Never used  Substance and Sexual Activity   Alcohol use: No   Drug use: Not Currently    Types: Hydrocodone   Sexual activity: Not Currently  Other Topics Concern   Not on file  Social History Narrative   Not on file   Social Determinants of Health   Financial Resource Strain: Not on file  Food Insecurity: Not on file  Transportation Needs: Not on file  Physical Activity: Not on file  Stress: Not on file  Social Connections: Not on file  Intimate Partner Violence: Not on file    Past Surgical History:  Procedure Laterality Date   ABDOMINAL HYSTERECTOMY     APPENDECTOMY     BACK SURGERY  2014   lumbar fusion/plate/rods/screws   CARDIAC CATHETERIZATION     CATARACT EXTRACTION W/PHACO Left 08/28/2020    Procedure: CATARACT EXTRACTION PHACO AND INTRAOCULAR LENS PLACEMENT ([place]) LEFT DIABETIC;  Surgeon: Leandrew Koyanagi, MD;  Location: [place];  Service: Ophthalmology;  Laterality: Left;  6.59 0:53.9 12.2%   CATARACT EXTRACTION W/PHACO Right 09/11/2020   Procedure: CATARACT EXTRACTION PHACO AND INTRAOCULAR LENS PLACEMENT ([place]) RIGHT DIABETIC;  Surgeon: Leandrew Koyanagi, MD;  Location: [place];  Service: Ophthalmology;  Laterality: Right;  4.88 1:01.8   CHOLECYSTECTOMY  2005   COLONOSCOPY WITH PROPOFOL N/A 01/18/2017   Procedure: COLONOSCOPY WITH PROPOFOL;  Surgeon: Lollie Sails, MD;  [facility];  Service: Endoscopy;  Laterality: N/A;   CORONARY ARTERY BYPASS GRAFT     2008 2 vessels   CORONARY ARTERY BYPASS GRAFT  2008   CORONARY STENT INTERVENTION  10/18/2019   CORONARY STENT INTERVENTION N/A 10/18/2019   Procedure: CORONARY STENT INTERVENTION;  Surgeon: Wellington Hampshire, MD;  Location: [place] [facility];  Service: Cardiovascular;  Laterality: N/A;   ESOPHAGOGASTRODUODENOSCOPY (EGD) WITH PROPOFOL N/A 05/16/2018   Procedure: ESOPHAGOGASTRODUODENOSCOPY (EGD) WITH PROPOFOL;  Surgeon: Lollie Sails, MD;  Location[facility];  Service: Endoscopy;  Laterality: N/A;   ESOPHAGOGASTRODUODENOSCOPY (EGD) WITH PROPOFOL N/A 11/05/2020   Procedure: ESOPHAGOGASTRODUODENOSCOPY (EGD) WITH PROPOFOL;  Surgeon: Lesly Rubenstein, MD;  Location: [facility] ENDOSCOPY;  Service: Endoscopy;  Laterality: N/A;  DM   FRACTURE SURGERY     HERNIA REPAIR  1987   incisional   JOINT REPLACEMENT Right 2017   knee   ORIF ANKLE FRACTURE Left 06/19/2016   Procedure: OPEN REDUCTION INTERNAL FIXATION (ORIF) ANKLE FRACTURE;  Surgeon: Thornton Park, MD;  Location: [facility];  Service: Orthopedics;  Laterality: Left;   rcr     RIGHT/LEFT HEART CATH AND CORONARY/GRAFT ANGIOGRAPHY N/A 10/18/2019   Procedure: RIGHT/LEFT HEART CATH AND CORONARY/GRAFT ANGIOGRAPHY;  Surgeon: Wellington Hampshire, MD;  Location: [place] [facility];  Service: Cardiovascular;  Laterality: N/A;   ROTATOR CUFF REPAIR Left 2013   TONSILLECTOMY     TOTAL KNEE ARTHROPLASTY Right 12/19/2015   Procedure: TOTAL KNEE ARTHROPLASTY;  Surgeon: Corky Mull, MD;  Location: [facility];  Service: Orthopedics;  Laterality: Right;   TUBAL LIGATION     VISCERAL ANGIOGRAPHY N/A 05/23/2018   Procedure: VISCERAL ANGIOGRAPHY;  Surgeon: Algernon Huxley, MD;  Location: [place] [facility];  Service: Cardiovascular;  Laterality: N/A;    Family History  Problem  Relation Age of Onset   Breast cancer Maternal Aunt 65   Heart disease Mother    Heart attack Mother    Heart disease Father    Heart attack Father    Diabetes Father    Breast cancer Paternal Grandmother    Cancer Paternal Aunt     Allergies  Allergen Reactions   Simethicone Anaphylaxis and Hives   Morphine And Related Nausea And Vomiting    Severe n & v. Any other pain medications are okay   Penicillins Hives    Has patient had a PCN reaction causing immediate rash, facial/tongue/throat swelling, SOB or lightheadedness with hypotension: No Has patient had a PCN reaction causing severe rash involving mucus membranes or skin necrosis: No Has patient had a PCN reaction that required hospitalization No Has patient had a PCN reaction occurring within the last 10 years: No If all of the above answers are "NO", then may proceed with Cephalosporin use.        Latest Ref Rng & Units 08/18/2020   11:06 AM 03/29/2020    4:26 PM 10/19/2019    4:16 AM  CBC  WBC 4.0 - 10.5 K/uL 12.5  10.5  7.8   Hemoglobin 12.0 - 15.0 g/dL 11.9  12.7  10.4   Hematocrit 36.0 - 46.0 % 35.9  38.5  31.5   Platelets 150 - 400 K/uL 306  318  246       CMP     Component Value Date/Time   NA 142 10/17/2020 1444   NA 136 09/29/2019 1405   NA 134 (L) 08/01/2012 1030   K 4.5 10/17/2020 1444   K 4.9 08/01/2012 1030   CL 105 10/17/2020 1444   CL 102 08/01/2012 1030   CO2 28  10/17/2020 1444   CO2 29 08/01/2012 1030   GLUCOSE 124 (H) 10/17/2020 1444   GLUCOSE 146 (H) 08/01/2012 1030   BUN 23 10/17/2020 1444   BUN 29 (H) 09/29/2019 1405   BUN 14 08/01/2012 1030   CREATININE 1.21 (H) 10/17/2020 1444   CREATININE 0.77 08/01/2012 1030   CALCIUM 9.5 10/17/2020 1444   CALCIUM 9.6 08/01/2012 1030   PROT 7.5 10/17/2020 1444   PROT 7.6 08/01/2012 1030   ALBUMIN 4.3 10/17/2020 1444   ALBUMIN 3.5 08/01/2012 1030   AST 18 10/17/2020 1444   AST 24 08/01/2012 1030   ALT 14 10/17/2020 1444   ALT 25 08/01/2012 1030   ALKPHOS 56 10/17/2020 1444   ALKPHOS 117 08/01/2012 1030   BILITOT 0.5 10/17/2020 1444   BILITOT 0.2 08/01/2012 1030   GFRNONAA 47 (L) 10/17/2020 1444   GFRNONAA >60 08/01/2012 1030   GFRAA 51 (L) 12/11/2019 1436   GFRAA >60 08/01/2012 1030     No results found.     Assessment & Plan:   1. Chronic mesenteric ischemia (HCC) Recommend:  The patient has evidence of severe atherosclerotic changes of the mesenteric arteries associated with weight loss as well as abdominal pain and N/V.  This represents a high risk for bowel infarction and death.  Patient should undergo angiography of the mesenteric arteries with the hope for intervention to eliminate the ischemic symptoms.    The risks and benefits as well as the alternative therapies was discussed in detail with the patient.  All questions were answered.  Patient agrees to proceed with angiography and intervention.  The patient will follow up with me after the angiogram.  2. Primary hypertension Continue antihypertensive medications as already ordered, these  medications have been reviewed and there are no changes at this time.  3. Hyperlipidemia, unspecified hyperlipidemia type Continue statin as ordered and reviewed, no changes at this time   Current Outpatient Medications on File Prior to Visit  Medication Sig Dispense Refill   amLODipine (NORVASC) 5 MG tablet Take 1 tablet (5 mg total)  by mouth 2 (two) times daily.     aspirin 81 MG tablet Take 1 tablet (81 mg total) by mouth daily. 30 tablet 0   atorvastatin (LIPITOR) 80 MG tablet TAKE 1 TABLET BY MOUTH EVERYDAY AT BEDTIME 90 tablet 3   clopidogrel (PLAVIX) 75 MG tablet TAKE 1 TABLET BY MOUTH EVERY DAY WITH BREAKFAST 90 tablet 0   enalapril (VASOTEC) 20 MG tablet Take 0.5 tablets (10 mg total) by mouth 2 (two) times daily. 180 tablet 3   ezetimibe (ZETIA) 10 MG tablet TAKE 1 TABLET BY MOUTH EVERY DAY 90 tablet 3   fluticasone (FLONASE) 50 MCG/ACT nasal spray Place 1-2 sprays into both nostrils daily as needed for allergies.      gabapentin (NEURONTIN) 300 MG capsule Take 600 mg by mouth at bedtime.     glimepiride (AMARYL) 2 MG tablet Take 2 mg by mouth daily with breakfast.     glucose blood (ONETOUCH ULTRA) test strip USE 2 (TWO) TIMES DAILY. USE AS INSTRUCTED.     insulin detemir (LEVEMIR) 100 UNIT/ML injection Inject 27 Units into the skin daily.      levothyroxine (SYNTHROID) 88 MCG tablet Take by mouth.     metFORMIN (GLUCOPHAGE) 1000 MG tablet Take 1 tablet (1,000 mg total) by mouth 2 (two) times daily with a meal. Resume on 10/21/19.     nitroGLYCERIN (NITROSTAT) 0.4 MG SL tablet Place 1 tablet (0.4 mg total) under the tongue every 5 (five) minutes as needed for chest pain. 25 tablet 3   ondansetron (ZOFRAN-ODT) 4 MG disintegrating tablet Take 4 mg by mouth every 8 (eight) hours as needed for nausea or vomiting.     pantoprazole (PROTONIX) 40 MG tablet Take 40 mg by mouth daily.      Polyethyl Glycol-Propyl Glycol 0.4-0.3 % SOLN Place 1 drop into both eyes 3 (three) times daily as needed (for dry eyes).     sertraline (ZOLOFT) 100 MG tablet Take 100 mg by mouth daily.     tiZANidine (ZANAFLEX) 2 MG tablet Take 2 mg by mouth every 6 (six) hours as needed for muscle spasms.     VITAMIN D PO Take 1 tablet by mouth daily.     No current facility-administered medications on file prior to visit.    There are no Patient  Instructions on file for this visit. No follow-ups on file.   Kris Hartmann, NP

## 2022-05-13 NOTE — Progress Notes (Signed)
Subjective:    Patient ID: Alexandria Marquez, female    DOB: March 21, 1946, 77 y.o.   MRN: [id number] Chief Complaint  Patient presents with   Follow-up    Ref [name]    Alexandria Marquez is a 77 year old female that presents today for follow-up evaluation of her chronic vessel ischemia.  This was initially evaluated in 2020 but this was at the height of the pandemic and the patient was understandably concerned about being hospitalized and exposed to the virus.  We last saw the patient in 2021 and discussed performing an angiogram however she has recently had to care for her husband and and has not been able to move forward with angiogram.  She does continue to have issues with postprandial nausea, diarrhea as well as occasional abdominal pain.  She does have a history of a previously placed mesenteric stent.  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  Previously placed stent is patent with some elevated velocities however this is somewhat consistent with previous studies done in 2021.    Review of Systems  Gastrointestinal:  Positive for abdominal pain, diarrhea and nausea.  All other systems reviewed and are negative.      Objective:   Physical Exam Vitals reviewed.  HENT:     Head: Normocephalic.  Cardiovascular:     Rate and Rhythm: Normal rate.     Pulses:          Radial pulses are 2+ on the right side and 2+ on the left side.  Pulmonary:     Effort: Pulmonary effort is normal.  Skin:    General: Skin is warm and dry.  Neurological:     Mental Status: She is alert and oriented to person, place, and time.  Psychiatric:        Mood and Affect: Mood normal.        Behavior: Behavior normal.        Thought Content: Thought content normal.        Judgment: Judgment normal.     BP (!) 167/71 (BP Location: Left Arm)   Pulse 61   Resp 16   Wt 163 lb 12.8 oz (74.3 kg)   BMI 31.99 kg/m   Past Medical History:  Diagnosis  Date   Anxiety    Arthritis    Asthma    when young   Coronary artery disease    a. 2008 s/p CABG x 2 (LIMA->LAD, VG->Diag); b. 09/2019 PCI: LM min irregs, LAD 100p/m, 85/76m LCX 622m, RCA 8584m.0x18 Resolute Onyx DES), VG->D2 ok, LIMA->LAD 50p/d. EF 55-65%; c. 09/2020 MV: EF 83%, no isch/infarct.   Depression    Diabetes mellitus without complication ([place]  Diastolic dysfunction    a. 08/2019 Echo: EF 60-65%, no rwma, mild LVH, Gr1 DD. RVSP 43.9mm4m Mild MR, mild to mod TR. AoV sclerosis.   Environmental and seasonal allergies    GERD (gastroesophageal reflux disease)    History of hiatal hernia    Hyperlipidemia    Hypertension    Hypothyroid    Hypothyroidism    Lower extremity edema    Motion sickness    OSA (obstructive sleep apnea) 06/04/2014   Osteoarthritis    Scoliosis    Shortness of breath dyspnea    doe   Vertigo     Social History   Socioeconomic History   Marital status: Married    Spouse name: NorrVeverly Fellsumber of children:  Not on file   Years of education: Not on file   Highest education level: Not on file  Occupational History   Not on file  Tobacco Use   Smoking status: Never   Smokeless tobacco: Never  Vaping Use   Vaping Use: Never used  Substance and Sexual Activity   Alcohol use: No   Drug use: Not Currently    Types: Hydrocodone   Sexual activity: Not Currently  Other Topics Concern   Not on file  Social History Narrative   Not on file   Social Determinants of Health   Financial Resource Strain: Not on file  Food Insecurity: Not on file  Transportation Needs: Not on file  Physical Activity: Not on file  Stress: Not on file  Social Connections: Not on file  Intimate Partner Violence: Not on file    Past Surgical History:  Procedure Laterality Date   ABDOMINAL HYSTERECTOMY     APPENDECTOMY     BACK SURGERY  2014   lumbar fusion/plate/rods/screws   CARDIAC CATHETERIZATION     CATARACT EXTRACTION W/PHACO Left 08/28/2020    Procedure: CATARACT EXTRACTION PHACO AND INTRAOCULAR LENS PLACEMENT ([place]) LEFT DIABETIC;  Surgeon: Leandrew Koyanagi, MD;  Location: [place];  Service: Ophthalmology;  Laterality: Left;  6.59 0:53.9 12.2%   CATARACT EXTRACTION W/PHACO Right 09/11/2020   Procedure: CATARACT EXTRACTION PHACO AND INTRAOCULAR LENS PLACEMENT ([place]) RIGHT DIABETIC;  Surgeon: Leandrew Koyanagi, MD;  Location: [place];  Service: Ophthalmology;  Laterality: Right;  4.88 1:01.8   CHOLECYSTECTOMY  2005   COLONOSCOPY WITH PROPOFOL N/A 01/18/2017   Procedure: COLONOSCOPY WITH PROPOFOL;  Surgeon: Lollie Sails, MD;  Location: [facility] ENDOSCOPY;  Service: Endoscopy;  Laterality: N/A;   CORONARY ARTERY BYPASS GRAFT     2008 2 vessels   CORONARY ARTERY BYPASS GRAFT  2008   CORONARY STENT INTERVENTION  10/18/2019   CORONARY STENT INTERVENTION N/A 10/18/2019   Procedure: CORONARY STENT INTERVENTION;  Surgeon: Wellington Hampshire, MD;  Location: [place] [facility];  Service: Cardiovascular;  Laterality: N/A;   ESOPHAGOGASTRODUODENOSCOPY (EGD) WITH PROPOFOL N/A 05/16/2018   Procedure: ESOPHAGOGASTRODUODENOSCOPY (EGD) WITH PROPOFOL;  Surgeon: Lollie Sails, MD;  Location: [facility] ENDOSCOPY;  Service: Endoscopy;  Laterality: N/A;   ESOPHAGOGASTRODUODENOSCOPY (EGD) WITH PROPOFOL N/A 11/05/2020   Procedure: ESOPHAGOGASTRODUODENOSCOPY (EGD) WITH PROPOFOL;  Surgeon: Lesly Rubenstein, MD;  Location: [facility] ENDOSCOPY;  Service: Endoscopy;  Laterality: N/A;  DM   FRACTURE SURGERY     HERNIA REPAIR  1987   incisional   JOINT REPLACEMENT Right 2017   knee   ORIF ANKLE FRACTURE Left 06/19/2016   Procedure: OPEN REDUCTION INTERNAL FIXATION (ORIF) ANKLE FRACTURE;  Surgeon: Thornton Park, MD;  Location: [facility];  Service: Orthopedics;  Laterality: Left;   rcr     RIGHT/LEFT HEART CATH AND CORONARY/GRAFT ANGIOGRAPHY N/A 10/18/2019   Procedure: RIGHT/LEFT HEART CATH AND CORONARY/GRAFT ANGIOGRAPHY;  Surgeon: Wellington Hampshire, MD;  Location: [place] [facility];  Service: Cardiovascular;  Laterality: N/A;   ROTATOR CUFF REPAIR Left 2013   TONSILLECTOMY     TOTAL KNEE ARTHROPLASTY Right 12/19/2015   Procedure: TOTAL KNEE ARTHROPLASTY;  Surgeon: Corky Mull, MD;  Location: [facility];  Service: Orthopedics;  Laterality: Right;   TUBAL LIGATION     VISCERAL ANGIOGRAPHY N/A 05/23/2018   Procedure: VISCERAL ANGIOGRAPHY;  Surgeon: Algernon Huxley, MD;  Location: [place] [facility];  Service: Cardiovascular;  Laterality: N/A;    Family History  Problem  Relation Age of Onset   Breast cancer Maternal Aunt 11   Heart disease Mother    Heart attack Mother    Heart disease Father    Heart attack Father    Diabetes Father    Breast cancer Paternal Grandmother    Cancer Paternal Aunt     Allergies  Allergen Reactions   Simethicone Anaphylaxis and Hives   Morphine And Related Nausea And Vomiting    Severe n & v. Any other pain medications are okay   Penicillins Hives    Has patient had a PCN reaction causing immediate rash, facial/tongue/throat swelling, SOB or lightheadedness with hypotension: No Has patient had a PCN reaction causing severe rash involving mucus membranes or skin necrosis: No Has patient had a PCN reaction that required hospitalization No Has patient had a PCN reaction occurring within the last 10 years: No If all of the above answers are "NO", then may proceed with Cephalosporin use.        Latest Ref Rng & Units 08/18/2020   11:06 AM 03/29/2020    4:26 PM 10/19/2019    4:16 AM  CBC  WBC 4.0 - 10.5 K/uL 12.5  10.5  7.8   Hemoglobin 12.0 - 15.0 g/dL 11.9  12.7  10.4   Hematocrit 36.0 - 46.0 % 35.9  38.5  31.5   Platelets 150 - 400 K/uL 306  318  246       CMP     Component Value Date/Time   NA 142 10/17/2020 1444   NA 136 09/29/2019 1405   NA 134 (L) 08/01/2012 1030   K 4.5 10/17/2020 1444   K 4.9 08/01/2012 1030   CL 105 10/17/2020 1444   CL 102 08/01/2012 1030   CO2 28  10/17/2020 1444   CO2 29 08/01/2012 1030   GLUCOSE 124 (H) 10/17/2020 1444   GLUCOSE 146 (H) 08/01/2012 1030   BUN 23 10/17/2020 1444   BUN 29 (H) 09/29/2019 1405   BUN 14 08/01/2012 1030   CREATININE 1.21 (H) 10/17/2020 1444   CREATININE 0.77 08/01/2012 1030   CALCIUM 9.5 10/17/2020 1444   CALCIUM 9.6 08/01/2012 1030   PROT 7.5 10/17/2020 1444   PROT 7.6 08/01/2012 1030   ALBUMIN 4.3 10/17/2020 1444   ALBUMIN 3.5 08/01/2012 1030   AST 18 10/17/2020 1444   AST 24 08/01/2012 1030   ALT 14 10/17/2020 1444   ALT 25 08/01/2012 1030   ALKPHOS 56 10/17/2020 1444   ALKPHOS 117 08/01/2012 1030   BILITOT 0.5 10/17/2020 1444   BILITOT 0.2 08/01/2012 1030   GFRNONAA 47 (L) 10/17/2020 1444   GFRNONAA >60 08/01/2012 1030   GFRAA 51 (L) 12/11/2019 1436   GFRAA >60 08/01/2012 1030     No results found.     Assessment & Plan:   1. Chronic mesenteric ischemia (HCC) Recommend:  The patient has evidence of severe atherosclerotic changes of the mesenteric arteries associated with weight loss as well as abdominal pain and N/V.  This represents a high risk for bowel infarction and death.  Patient should undergo angiography of the mesenteric arteries with the hope for intervention to eliminate the ischemic symptoms.    The risks and benefits as well as the alternative therapies was discussed in detail with the patient.  All questions were answered.  Patient agrees to proceed with angiography and intervention.  The patient will follow up with me after the angiogram.  2. Primary hypertension Continue antihypertensive medications as already ordered, these  medications have been reviewed and there are no changes at this time.  3. Hyperlipidemia, unspecified hyperlipidemia type Continue statin as ordered and reviewed, no changes at this time   Current Outpatient Medications on File Prior to Visit  Medication Sig Dispense Refill   amLODipine (NORVASC) 5 MG tablet Take 1 tablet (5 mg total)  by mouth 2 (two) times daily.     aspirin 81 MG tablet Take 1 tablet (81 mg total) by mouth daily. 30 tablet 0   atorvastatin (LIPITOR) 80 MG tablet TAKE 1 TABLET BY MOUTH EVERYDAY AT BEDTIME 90 tablet 3   clopidogrel (PLAVIX) 75 MG tablet TAKE 1 TABLET BY MOUTH EVERY DAY WITH BREAKFAST 90 tablet 0   enalapril (VASOTEC) 20 MG tablet Take 0.5 tablets (10 mg total) by mouth 2 (two) times daily. 180 tablet 3   ezetimibe (ZETIA) 10 MG tablet TAKE 1 TABLET BY MOUTH EVERY DAY 90 tablet 3   fluticasone (FLONASE) 50 MCG/ACT nasal spray Place 1-2 sprays into both nostrils daily as needed for allergies.      gabapentin (NEURONTIN) 300 MG capsule Take 600 mg by mouth at bedtime.     glimepiride (AMARYL) 2 MG tablet Take 2 mg by mouth daily with breakfast.     glucose blood (ONETOUCH ULTRA) test strip USE 2 (TWO) TIMES DAILY. USE AS INSTRUCTED.     insulin detemir (LEVEMIR) 100 UNIT/ML injection Inject 27 Units into the skin daily.      levothyroxine (SYNTHROID) 88 MCG tablet Take by mouth.     metFORMIN (GLUCOPHAGE) 1000 MG tablet Take 1 tablet (1,000 mg total) by mouth 2 (two) times daily with a meal. Resume on 10/21/19.     nitroGLYCERIN (NITROSTAT) 0.4 MG SL tablet Place 1 tablet (0.4 mg total) under the tongue every 5 (five) minutes as needed for chest pain. 25 tablet 3   ondansetron (ZOFRAN-ODT) 4 MG disintegrating tablet Take 4 mg by mouth every 8 (eight) hours as needed for nausea or vomiting.     pantoprazole (PROTONIX) 40 MG tablet Take 40 mg by mouth daily.      Polyethyl Glycol-Propyl Glycol 0.4-0.3 % SOLN Place 1 drop into both eyes 3 (three) times daily as needed (for dry eyes).     sertraline (ZOLOFT) 100 MG tablet Take 100 mg by mouth daily.     tiZANidine (ZANAFLEX) 2 MG tablet Take 2 mg by mouth every 6 (six) hours as needed for muscle spasms.     VITAMIN D PO Take 1 tablet by mouth daily.     No current facility-administered medications on file prior to visit.    There are no Patient  Instructions on file for this visit. No follow-ups on file.   Kris Hartmann, NP

## 2022-05-15 ENCOUNTER — Telehealth (INDEPENDENT_AMBULATORY_CARE_PROVIDER_SITE_OTHER): Payer: Self-pay

## 2022-05-15 NOTE — Telephone Encounter (Signed)
Scheduled 03/29

## 2022-05-15 NOTE — Telephone Encounter (Signed)
Spoke with the patient and she is scheduled with Dr. Lucky Cowboy on 05/25/22 for a mesenteric angio with a 8:30 am arrival time to the [facility]. Pre-procedure instructions were discussed and will be mailed.

## 2022-05-25 ENCOUNTER — Ambulatory Visit
Admission: RE | Admit: 2022-05-25 | Discharge: 2022-05-25 | Disposition: A | Discharge status: Home / Self Care | Payer: Medicare Other | Attending: Vascular Surgery | Admitting: Vascular Surgery

## 2022-05-25 ENCOUNTER — Encounter: Payer: Self-pay | Admitting: Vascular Surgery

## 2022-05-25 ENCOUNTER — Other Ambulatory Visit: Payer: Self-pay

## 2022-05-25 ENCOUNTER — Encounter
Admission: RE | Disposition: A | Discharge status: Home / Self Care | Payer: Self-pay | Source: Home / Self Care | Attending: Vascular Surgery

## 2022-05-25 DIAGNOSIS — I708 Atherosclerosis of other arteries: Secondary | ICD-10-CM | POA: Diagnosis not present

## 2022-05-25 DIAGNOSIS — E785 Hyperlipidemia, unspecified: Secondary | ICD-10-CM | POA: Diagnosis not present

## 2022-05-25 DIAGNOSIS — I10 Essential (primary) hypertension: Secondary | ICD-10-CM | POA: Insufficient documentation

## 2022-05-25 DIAGNOSIS — K551 Chronic vascular disorders of intestine: Secondary | ICD-10-CM | POA: Insufficient documentation

## 2022-05-25 HISTORY — PX: VISCERAL ANGIOGRAPHY: CATH118276

## 2022-05-25 LAB — BUN: BUN: 21 mg/dL (ref 8–23)

## 2022-05-25 LAB — GLUCOSE, CAPILLARY
Glucose-Capillary: 147 mg/dL — ABNORMAL HIGH (ref 70–99)
Glucose-Capillary: 222 mg/dL — ABNORMAL HIGH (ref 70–99)

## 2022-05-25 LAB — CREATININE, SERUM
Creatinine, Ser: 0.97 mg/dL (ref 0.44–1.00)
GFR, Estimated: >60 mL/min (ref >60)

## 2022-05-25 SURGERY — VISCERAL ANGIOGRAPHY
Anesthesia: Moderate Sedation

## 2022-05-25 MED ORDER — METHYLPREDNISOLONE SODIUM SUCC 125 MG IJ SOLR: 125.0000 mg | Freq: Once | INTRAMUSCULAR | Status: DC | PRN

## 2022-05-25 MED ORDER — ONDANSETRON HCL 4 MG/2ML IJ SOLN
INTRAMUSCULAR | Status: AC
  Filled 2022-05-25: qty 2

## 2022-05-25 MED ORDER — FAMOTIDINE 20 MG PO TABS: 40.0000 mg | ORAL_TABLET | Freq: Once | ORAL | Status: DC | PRN

## 2022-05-25 MED ORDER — ONDANSETRON HCL 4 MG/2ML IJ SOLN
4.0000 mg | Freq: Four times a day (QID) | INTRAMUSCULAR | Status: DC | PRN
  Administered 2022-05-25: 4 mg via INTRAVENOUS

## 2022-05-25 MED ORDER — FENTANYL CITRATE (PF) 100 MCG/2ML IJ SOLN
INTRAMUSCULAR | Status: AC
  Filled 2022-05-25: qty 2

## 2022-05-25 MED ORDER — HEPARIN SODIUM (PORCINE) 1000 UNIT/ML IJ SOLN
INTRAMUSCULAR | Status: DC | PRN
  Administered 2022-05-25: 4000 [IU] via INTRAVENOUS

## 2022-05-25 MED ORDER — FENTANYL CITRATE (PF) 100 MCG/2ML IJ SOLN
INTRAMUSCULAR | Status: DC | PRN
  Administered 2022-05-25: 50 ug via INTRAVENOUS

## 2022-05-25 MED ORDER — VANCOMYCIN HCL IN DEXTROSE 1-5 GM/200ML-% IV SOLN: 1000.0000 mg | INTRAVENOUS | Status: DC

## 2022-05-25 MED ORDER — SODIUM CHLORIDE 0.9 % IV SOLN: INTRAVENOUS | Status: DC

## 2022-05-25 MED ORDER — MIDAZOLAM HCL 2 MG/ML PO SYRP: 8.0000 mg | ORAL_SOLUTION | Freq: Once | ORAL | Status: DC | PRN

## 2022-05-25 MED ORDER — HYDROMORPHONE HCL 1 MG/ML IJ SOLN
1.0000 mg | Freq: Once | INTRAMUSCULAR | Status: AC | PRN
  Administered 2022-05-25: 0.5 mg via INTRAVENOUS

## 2022-05-25 MED ORDER — HYDROMORPHONE HCL 1 MG/ML IJ SOLN
INTRAMUSCULAR | Status: AC
  Filled 2022-05-25: qty 0.5

## 2022-05-25 MED ORDER — HEPARIN SODIUM (PORCINE) 1000 UNIT/ML IJ SOLN
INTRAMUSCULAR | Status: AC
  Filled 2022-05-25: qty 10

## 2022-05-25 MED ORDER — MIDAZOLAM HCL 2 MG/2ML IJ SOLN
INTRAMUSCULAR | Status: DC | PRN
  Administered 2022-05-25: 2 mg via INTRAVENOUS

## 2022-05-25 MED ORDER — VANCOMYCIN HCL IN DEXTROSE 1-5 GM/200ML-% IV SOLN
INTRAVENOUS | Status: DC | PRN
  Administered 2022-05-25: 1000 mg via INTRAVENOUS

## 2022-05-25 MED ORDER — VANCOMYCIN HCL IN DEXTROSE 1-5 GM/200ML-% IV SOLN
INTRAVENOUS | Status: AC
  Filled 2022-05-25: qty 200

## 2022-05-25 MED ORDER — HYDRALAZINE HCL 20 MG/ML IJ SOLN
INTRAMUSCULAR | Status: AC
  Filled 2022-05-25: qty 1

## 2022-05-25 MED ORDER — IODIXANOL 320 MG/ML IV SOLN
INTRAVENOUS | Status: DC | PRN
  Administered 2022-05-25: 65 mL via INTRA_ARTERIAL

## 2022-05-25 MED ORDER — DIPHENHYDRAMINE HCL 50 MG/ML IJ SOLN: 50.0000 mg | Freq: Once | INTRAMUSCULAR | Status: DC | PRN

## 2022-05-25 MED ORDER — MIDAZOLAM HCL 5 MG/5ML IJ SOLN
INTRAMUSCULAR | Status: AC
  Filled 2022-05-25: qty 5

## 2022-05-25 MED ORDER — HYDRALAZINE HCL 20 MG/ML IJ SOLN
INTRAMUSCULAR | Status: DC | PRN
  Administered 2022-05-25: 10 mg via INTRAVENOUS

## 2022-05-25 SURGICAL SUPPLY — 15 items
BALLN ULTRVRSE 8X40X75C (BALLOONS) ×1
BALLOON ULTRVRSE 8X40X75C (BALLOONS) IMPLANT
CATH ANGIO 5F PIGTAIL 65CM (CATHETERS) IMPLANT
CATH VS15FR (CATHETERS) IMPLANT
DEVICE TORQUE (MISCELLANEOUS) IMPLANT
GLIDEWIRE STIFF .35X180X3 HYDR (WIRE) IMPLANT
KIT ENCORE 26 ADVANTAGE (KITS) IMPLANT
PACK ANGIOGRAPHY (CUSTOM PROCEDURE TRAY) ×1 IMPLANT
SHEATH ANL2 6FRX45 HC (SHEATH) IMPLANT
SHEATH BRITE TIP 5FRX11 (SHEATH) IMPLANT
STENT LIFESTREAM 7X16X80 (Permanent Stent) IMPLANT
SYR MEDRAD MARK 7 150ML (SYRINGE) IMPLANT
TUBING CONTRAST HIGH PRESS 72 (TUBING) IMPLANT
WIRE GUIDERIGHT .035X150 (WIRE) IMPLANT
WIRE SUPRACORE 190CM (WIRE) IMPLANT

## 2022-05-25 NOTE — Interval H&P Note (Signed)
History and Physical Interval Note:  05/25/2022 9:39 AM  Alexandria Marquez  has presented today for surgery, with the diagnosis of Mesenteric Angio    Chronic mesenteric ischemia.  The various methods of treatment have been discussed with the patient and family. After consideration of risks, benefits and other options for treatment, the patient has consented to  Procedure(s): VISCERAL ANGIOGRAPHY (N/A) as a surgical intervention.  The patient's history has been reviewed, patient examined, no change in status, stable for surgery.  I have reviewed the patient's chart and labs.  Questions were answered to the patient's satisfaction.     Leotis Pain

## 2022-05-25 NOTE — Op Note (Signed)
[facility]  Percutaneous Study/Intervention Procedural Note   Date: 05/25/2022  Surgeon(s): Leotis Pain, MD  Assistants: none  Pre-operative Diagnosis: 1.  Chronic Mesenteric ischemia 2. Celiac and possible SMA stenosis   Post-operative diagnosis:  Same  Procedure(s) Performed:             1.  Ultrasound guidance for vascular access right femoral artery              2.  Catheter placement into SMA and celiac artery from right femoral approach             3.  Aortogram and selective angiogram of the SMA and celiac arteries             4.  Stent to the Celiac artery with 7 mm diameter x 16 mm length balloon expandable stent post dilated to 8 mm             5.  StarClose closure device right femoral artery  Contrast: 65  Fluoro time: 5.2 minutes  EBL: 10 cc  Anesthesia: Approximately 36 minutes of Moderate conscious sedation using 2 mg of Versed and 50 mcg of Fentanyl              Indications:  Patient is a 77 y.o. female who has symptoms consistent with mesenteric ischemia. The patient has a duplex showing elevated velocities in the celiac and SMA with a previously placed SMA stent. The patient is brought in for angiography for further evaluation and potential treatment. Risks and benefits are discussed and informed consent is obtained  Procedure:  The patient was identified and appropriate procedural time out was performed.  The patient was then placed supine on the table and prepped and draped in the usual sterile fashion. Moderate conscious sedation was administered during a face to face encounter with the patient throughout the procedure with my supervision of the RN administering medicines and monitoring the patient's vital signs, pulse oximetry, telemetry and mental status throughout from the start of the procedure until the patient was taken to the recovery room. Ultrasound was used to evaluate the right common femoral artery.  It was patent .  A digital  ultrasound image was acquired.  A Seldinger needle was used to access the right common femoral artery under direct ultrasound guidance and a permanent image was performed.  A 0.035 J wire was advanced without resistance and a 5Fr sheath was placed.  Pigtail catheter was placed into the aorta and an AP aortogram was performed. This demonstrated multiple renal arteries bilaterally which were small without a dominant renal artery and no obvious stenosis.  The aorta and iliac arteries were patent. We transitioned to the lateral projection to image the celiac and SMA. The lateral image demonstrated the stent in the SMA and some disease in the celiac artery was clear even on the initial aortogram and the lateral projection.  The SMA stent made it difficult to see if there is any stenosis there on the original aortogram.  A V S1 catheter was used to selectively cannulate first the SMA and then the celiac artery to perform selective imaging.  The SMA was found to be widely patent after previously placed stent without significant recurrent stenosis.  The celiac artery had about a 75 to 80% stenosis just beyond the origin.  The patient was given 4000 units of IV heparin. We upsized to a 6 Fr Ansell sheath over a supra core wire that was parked in the  hepatic artery branch of the celiac artery. Based on her symptoms and these findings, I elected to treat the celiac artery to try to improve the patient's clinical course. I then used a 7 mm diameter x 16 mm length balloon expandable stent to perform treatment of the celiac artery. I inflated the balloon to 12 atm.  I then postdilated this with a 8 mm balloon.  On completion angiogram following this, <10% residual stenosis was identified. At this point, I elected to terminate the procedure. The diagnostic catheter was removed. StarClose closure device was deployed in usual fashion with excellent hemostatic result. The patient was taken to the recovery room in stable condition  having tolerated the procedure well.     Findings: The SMA was found to be widely patent after previously placed stent without significant recurrent stenosis.  The celiac artery had about a 75 to 80% stenosis just beyond the origin.  Disposition: Patient was taken to the recovery room in stable condition having tolerated the procedure well.  Complications:  None  Leotis Pain 05/25/2022 10:49 AM   This note was created with Dragon Medical transcription system. Any errors in dictation are purely unintentional.

## 2022-05-27 ENCOUNTER — Encounter: Payer: Self-pay | Admitting: Vascular Surgery

## 2022-06-17 ENCOUNTER — Other Ambulatory Visit (INDEPENDENT_AMBULATORY_CARE_PROVIDER_SITE_OTHER): Payer: Self-pay | Admitting: Vascular Surgery

## 2022-06-17 DIAGNOSIS — I771 Stricture of artery: Secondary | ICD-10-CM

## 2022-06-19 ENCOUNTER — Ambulatory Visit: Payer: Medicare Other | Attending: Nurse Practitioner | Admitting: Nurse Practitioner

## 2022-06-19 ENCOUNTER — Encounter: Payer: Self-pay | Admitting: Nurse Practitioner

## 2022-06-19 ENCOUNTER — Other Ambulatory Visit: Payer: Self-pay | Admitting: Gastroenterology

## 2022-06-19 VITALS — BP 140/62 | HR 65 | Ht 59.0 in | Wt 166.0 lb

## 2022-06-19 DIAGNOSIS — I10 Essential (primary) hypertension: Secondary | ICD-10-CM | POA: Insufficient documentation

## 2022-06-19 DIAGNOSIS — E039 Hypothyroidism, unspecified: Secondary | ICD-10-CM | POA: Diagnosis present

## 2022-06-19 DIAGNOSIS — K551 Chronic vascular disorders of intestine: Secondary | ICD-10-CM | POA: Diagnosis present

## 2022-06-19 DIAGNOSIS — I739 Peripheral vascular disease, unspecified: Secondary | ICD-10-CM | POA: Diagnosis present

## 2022-06-19 DIAGNOSIS — R1013 Epigastric pain: Secondary | ICD-10-CM

## 2022-06-19 DIAGNOSIS — I25118 Atherosclerotic heart disease of native coronary artery with other forms of angina pectoris: Secondary | ICD-10-CM

## 2022-06-19 DIAGNOSIS — E119 Type 2 diabetes mellitus without complications: Secondary | ICD-10-CM | POA: Diagnosis present

## 2022-06-19 DIAGNOSIS — E785 Hyperlipidemia, unspecified: Secondary | ICD-10-CM

## 2022-06-19 DIAGNOSIS — I5032 Chronic diastolic (congestive) heart failure: Secondary | ICD-10-CM | POA: Diagnosis present

## 2022-06-19 MED ORDER — ENALAPRIL MALEATE 10 MG PO TABS: 10.0000 mg | ORAL_TABLET | Freq: Two times a day (BID) | ORAL | 5 refills | Status: DC

## 2022-06-19 MED ORDER — GLUCOSE 4 G PO CHEW
1.0000 | CHEWABLE_TABLET | Freq: Once | ORAL | Status: AC
  Administered 2022-06-19: 4 g via ORAL

## 2022-06-19 NOTE — Progress Notes (Signed)
Office Visit    Patient Name: Alexandria Marquez Date of Encounter: 06/19/2022  Primary Care Provider:  Marinda Elk, MD Primary Cardiologist:  Kathlyn Sacramento, MD  Chief Complaint    77 y/o ? w/ a h/o CAD s/p CABG and subsequent RCA stenting, diast dysfxn, mild pulmonary HTN, DM, PAD s/p SMA stenting (05/2018), HTN, HL, hypothyroidism, OSA, depression, anxiety, and GERD, who presents today for CAD f/u.  Past Medical History    Past Medical History:  Diagnosis Date   Anxiety    Arthritis    Asthma    when young   Coronary artery disease    a. 2008 s/p CABG x 2 (LIMA->LAD, VG->Diag); b. 09/2019 PCI: LM min irregs, LAD 100p/m, 85/11m, LCX 95m/d, RCA 22m (2.0x18 Resolute Onyx DES), VG->D2 ok, LIMA->LAD 50p/d. EF 55-65%; c. 09/2020 MV: EF 83%, no isch/infarct.   Depression    Diabetes mellitus without complication ([place])    Diastolic dysfunction    a. 08/2019 Echo: EF 60-65%, no rwma, mild LVH, Gr1 DD. RVSP 43.82mmHg. Mild MR, mild to mod TR. AoV sclerosis.   Environmental and seasonal allergies    GERD (gastroesophageal reflux disease)    History of hiatal hernia    Hyperlipidemia    Hypertension    Hypothyroid    Hypothyroidism    Lower extremity edema    Mesenteric ischemia ([place])    a. 05/2022 s/p Stent to the Celiac artery with 7 mm diameter x 16 mm length balloon.   Motion sickness    OSA (obstructive sleep apnea) 06/04/2014   Osteoarthritis    Scoliosis    Shortness of breath dyspnea    doe   Vertigo    Past Surgical History:  Procedure Laterality Date   ABDOMINAL HYSTERECTOMY     APPENDECTOMY     BACK SURGERY  2014   lumbar fusion/plate/rods/screws   CARDIAC CATHETERIZATION     CATARACT EXTRACTION W/PHACO Left 08/28/2020   Procedure: CATARACT EXTRACTION PHACO AND INTRAOCULAR LENS PLACEMENT ([place]) LEFT DIABETIC;  Surgeon: Leandrew Koyanagi, MD;  Location: [place];  Service: Ophthalmology;  Laterality: Left;  6.59 0:53.9 12.2%   CATARACT EXTRACTION  W/PHACO Right 09/11/2020   Procedure: CATARACT EXTRACTION PHACO AND INTRAOCULAR LENS PLACEMENT ([place]) RIGHT DIABETIC;  Surgeon: Leandrew Koyanagi, MD;  Location: [place];  Service: Ophthalmology;  Laterality: Right;  4.88 1:01.8   CHOLECYSTECTOMY  2005   COLONOSCOPY WITH PROPOFOL N/A 01/18/2017   Procedure: COLONOSCOPY WITH PROPOFOL;  Surgeon: Lollie Sails, MD;  Location: [facility] ENDOSCOPY;  Service: Endoscopy;  Laterality: N/A;   CORONARY ARTERY BYPASS GRAFT     2008 2 vessels   CORONARY ARTERY BYPASS GRAFT  2008   CORONARY STENT INTERVENTION  10/18/2019   CORONARY STENT INTERVENTION N/A 10/18/2019   Procedure: CORONARY STENT INTERVENTION;  Surgeon: Wellington Hampshire, MD;  Location: [place] [facility];  Service: Cardiovascular;  Laterality: N/A;   ESOPHAGOGASTRODUODENOSCOPY (EGD) WITH PROPOFOL N/A 05/16/2018   Procedure: ESOPHAGOGASTRODUODENOSCOPY (EGD) WITH PROPOFOL;  Surgeon: Lollie Sails, MD;  Location: [facility] ENDOSCOPY;  Service: Endoscopy;  Laterality: N/A;   ESOPHAGOGASTRODUODENOSCOPY (EGD) WITH PROPOFOL N/A 11/05/2020   Procedure: ESOPHAGOGASTRODUODENOSCOPY (EGD) WITH PROPOFOL;  Surgeon: Lesly Rubenstein, MD;  Location: [facility] ENDOSCOPY;  Service: Endoscopy;  Laterality: N/A;  DM   FRACTURE SURGERY     HERNIA REPAIR  1987   incisional   JOINT REPLACEMENT Right 2017   knee   ORIF ANKLE FRACTURE Left 06/19/2016   Procedure: OPEN  REDUCTION INTERNAL FIXATION (ORIF) ANKLE FRACTURE;  Surgeon: Thornton Park, MD;  Location: [facility] OR[facility];  Service: Orthopedics;  Laterality: Left;   rcr     RIGHT/LEFT HEART CATH AND CORONARY/GRAFT ANGIOGRAPHY N/A 10/18/2019   Procedure: RIGHT/LEFT HEART CATH AND CORONARY/GRAFT ANGIOGRAPHY;  Surgeon: Wellington Hampshire, MD;  Location: [place] [facility];  Service: Cardiovascular;  Laterality: N/A;   ROTATOR CUFF REPAIR Left 2013   TONSILLECTOMY     TOTAL KNEE ARTHROPLASTY Right 12/19/2015   Procedure: TOTAL KNEE ARTHROPLASTY;  Surgeon: Corky Mull, MD;  Location: [facility] ORS;  Service: Orthopedics;  Laterality: Right;   TUBAL LIGATION     VISCERAL ANGIOGRAPHY N/A 05/23/2018   Procedure: VISCERAL ANGIOGRAPHY;  Surgeon: Algernon Huxley, MD;  Location: [place] [facility];  Service: Cardiovascular;  Laterality: N/A;   VISCERAL ANGIOGRAPHY N/A 05/25/2022   Procedure: VISCERAL ANGIOGRAPHY;  Surgeon: Algernon Huxley, MD;  Location: [place] [facility];  Service: Cardiovascular;  Laterality: N/A;    Allergies  Allergies  Allergen Reactions   Simethicone Anaphylaxis and Hives   Morphine And Related Nausea And Vomiting    Severe n & v. Any other pain medications are okay   Penicillins Hives    Has patient had a PCN reaction causing immediate rash, facial/tongue/throat swelling, SOB or lightheadedness with hypotension: No Has patient had a PCN reaction causing severe rash involving mucus membranes or skin necrosis: No Has patient had a PCN reaction that required hospitalization No Has patient had a PCN reaction occurring within the last 10 years: No If all of the above answers are "NO", then may proceed with Cephalosporin use.     History of Present Illness    77 year old female with above past medical history including CAD, diastolic dysfunction, mild pulmonary hypertension, diabetes, peripheral arterial disease status post SMA stenting in March 2020, hypertension, hyperlipidemia, hypothyroidism, sleep apnea, depression, anxiety, and GERD.  She underwent CABG x 2 in 2008 at [facility].  In the setting of mesenteric ischemia, she underwent stenting of the superior mesenteric artery in March 2020, with improvement in abdominal symptoms.  In May 2021, she noted progressive exertional dyspnea and chest pain.  She underwent stress testing which was nonischemic.  Echo showed normal LV function and mild pulmonary hypertension.  She continued to have chest pain and dyspnea and subsequently underwent diagnostic catheterization July 2021, revealing to 50 pg new  RCA stenosis, which was successfully treated with a drug-eluting stent.  In the setting of bradycardia follow-up visit in January 2022, beta-blocker therapy was initially held but she was noted to have mild elevation of TSH in the setting of levothyroxine noncompliance.  With resumption of levothyroxine, fatigue symptoms improved she was able to maintain a beta-blocker therapy.  July 2022, Ms. Nedrow was noted to have more pronounced lateral T wave inversion.  Stress testing showed normal LV function without significant ischemia.  Subsequent EGD showed a single submucosal gastric papule with normal esophagus and duodenum.  Ms. Poage was last seen in cardiology clinic in February 2023 at which time she reported chronic, stable dyspnea on exertion without chest pain.  She recently underwent celiac artery stenting in early March with improvement in GI symptoms.  She notes that over the past few months, she has had a lot of trouble sleeping, which she attributes to frequent hypoglycemia and an alarm on her glucose monitoring device.  She has been working with primary care doctor and endocrinologist to hold her insulin regimen.  In the setting  of poor sleeping, many times just 3 hours a night, she often feels tired throughout the day.  She has chronic, stable dyspnea on exertion which has not changed.  She denies chest pain.  She has been experiencing some orthostatic lightheadedness as well as intermittent dizziness.  She reported dizziness today and though orthostatic vital signs were normal, her blood glucose was 47 and she required a glucose tablet while in clinic.  She denies palpitations, PND, orthopnea, syncope, edema, or early satiety.  Home Medications    Current Outpatient Medications  Medication Sig Dispense Refill   acetaminophen (TYLENOL) 500 MG tablet Take 1,000 mg by mouth every 6 (six) hours as needed for moderate pain or mild pain.     amLODipine (NORVASC) 5 MG tablet Take 1 tablet (5 mg total)  by mouth 2 (two) times daily.     aspirin 81 MG tablet Take 1 tablet (81 mg total) by mouth daily. 30 tablet 0   atorvastatin (LIPITOR) 80 MG tablet TAKE 1 TABLET BY MOUTH EVERYDAY AT BEDTIME 90 tablet 3   clopidogrel (PLAVIX) 75 MG tablet TAKE 1 TABLET BY MOUTH EVERY DAY WITH BREAKFAST 90 tablet 0   enalapril (VASOTEC) 10 MG tablet Take 1 tablet (10 mg total) by mouth 2 (two) times daily. 60 tablet 5   ezetimibe (ZETIA) 10 MG tablet TAKE 1 TABLET BY MOUTH EVERY DAY 90 tablet 3   fluticasone (FLONASE) 50 MCG/ACT nasal spray Place 1-2 sprays into both nostrils daily as needed for allergies.      gabapentin (NEURONTIN) 300 MG capsule Take 600 mg by mouth at bedtime.     glimepiride (AMARYL) 2 MG tablet Take 2 mg by mouth daily with breakfast.     glucose blood (ONETOUCH ULTRA) test strip USE 2 (TWO) TIMES DAILY. USE AS INSTRUCTED.     insulin detemir (LEVEMIR) 100 UNIT/ML injection Inject 27 Units into the skin daily.      levothyroxine (SYNTHROID) 88 MCG tablet Take by mouth.     metFORMIN (GLUCOPHAGE) 1000 MG tablet Take 1 tablet (1,000 mg total) by mouth 2 (two) times daily with a meal. Resume on 10/21/19.     nitroGLYCERIN (NITROSTAT) 0.4 MG SL tablet Place 1 tablet (0.4 mg total) under the tongue every 5 (five) minutes as needed for chest pain. 25 tablet 3   ondansetron (ZOFRAN-ODT) 4 MG disintegrating tablet Take 4 mg by mouth every 8 (eight) hours as needed for nausea or vomiting.     pantoprazole (PROTONIX) 40 MG tablet Take 40 mg by mouth daily.      Polyethyl Glycol-Propyl Glycol 0.4-0.3 % SOLN Place 1 drop into both eyes 3 (three) times daily as needed (for dry eyes).     sertraline (ZOLOFT) 100 MG tablet Take 100 mg by mouth daily.     tiZANidine (ZANAFLEX) 2 MG tablet Take 2 mg by mouth every 6 (six) hours as needed for muscle spasms.     VITAMIN D PO Take 1 tablet by mouth daily.     No current facility-administered medications for this visit.     Review of Systems    Tired,  fatigued, sometimes lightheaded or dizzy.  Chronic, stable dyspnea exertion.  She denies chest pain, palpitations, PND, orthopnea, syncope, edema, or early satiety.  All other systems reviewed and are otherwise negative except as noted above.    Physical Exam    VS:  BP (!) 140/62 (BP Location: Left Arm, Patient Position: Sitting)   Pulse 65   Ht  4\' 11"  (1.499 m)   Wt 166 lb (75.3 kg)   SpO2 97%   BMI 33.53 kg/m  , BMI Body mass index is 33.53 kg/m.    Orthostatic VS for the past 24 hrs:  BP- Lying Pulse- Lying BP- Sitting Pulse- Sitting BP- Standing at 0 minutes Pulse- Standing at 0 minutes  06/19/22 1543 136/81 64 158/80 71 162/73 72    GEN: Well nourished, well developed, in no acute distress. HEENT: normal. Neck: Supple, no JVD, carotid bruits, or masses. Cardiac: RRR, 2/6 systolic murmur at the upper sternal borders.  No rubs or gallops. No clubbing, cyanosis, edema.  Radials 2+/PT 2+ and equal bilaterally.  Respiratory:  Respirations regular and unlabored, clear to auscultation bilaterally. GI: Soft, nontender, nondistended, BS + x 4. MS: no deformity or atrophy. Skin: warm and dry, no rash. Neuro:  Strength and sensation are intact. Psych: Normal affect.  Accessory Clinical Findings    ECG personally reviewed by me today -regular sinus rhythm, 65, leftward axis with incomplete right bundle branch block and nonspecific T changes- no acute changes.  Lab work dated June 18, 2022 from [facility]:  TSH 5.295  Lab work dated April 20, 2022 from [facility]:  Hemoglobin 14.2, hematocrit 42.5, WBC 8.6, platelets 298 Sodium 137, potassium 4.4, chloride 102, CO2 21.6, BUN 22, creatinine 1.1, glucose 186 Total cholesterol 129, triglycerides 149, HDL 46, LDL 53 Hemoglobin A1c 9.1  Assessment & Plan    1.  Coronary artery disease: Status post CABG times 04/2006 with drug-eluting stent placement to the right coronary artery in July 2021.  She has known, residual 60%  mid to distal circumflex disease with low risk stress test in July 2022.  She has chronic, stable dyspnea exertion without chest pain.  She remains on aspirin, statin, Plavix, calcium channel blocker, and ACE inhibitor therapy.  2.  Essential hypertension: Blood pressure elevated today however, she does report occasional orthostatic lightheadedness and dizziness.  She has been dealing with low blood sugars at home and her sugar today is only 47.  She was not orthostatic by examination today however, in the setting of her symptoms at home, I am reluctant to titrate her antihypertensive therapy at this time.  I strongly encouraged her to follow blood pressures at home.  3.  Hyperglycemia/type 2 diabetes mellitus: Recent adjustments to her insulin therapy in the setting of hypoglycemia at home.  She reported some dizziness during orthostatics today with normal heart rates and blood pressures, and she was found to have a blood sugar of 47.  She was given a glucose tablet in clinic.  She will reach out to her endocrinologist for further adjustments related to her insulin therapy.  4.  Hyperlipidemia: LDL in January was 53.  She remains on atorvastatin therapy.  5.  Chronic HFpEF: Stable, chronic dyspnea on exertion.  Euvolemic on examination.  Blood pressure mildly elevated but in the setting of occasional orthostatic lightheadedness, reluctant to titrate medication today.  She does not require diuretic at home.  6.  Hypothyroidism: TSH normal on evaluation yesterday.  She is on levothyroxine therapy and followed by endocrinology.  7.  Mesenteric ischemia/PAD: Status post recent celiac artery stenting.  Followed by vascular surgery.  On aspirin and Plavix.  8.  Disposition: Follow-up in clinic in 6 months or sooner if necessary.  Murray Hodgkins, NP 06/19/2022, 5:28 PM

## 2022-06-19 NOTE — Patient Instructions (Addendum)
Medication Instructions:  Your physician recommends that you continue on your current medications as directed. Please refer to the Current Medication list given to you today.  *If you need a refill on your cardiac medications before your next appointment, please call your pharmacy*   Lab Work: No labs ordered  If you have labs (blood work) drawn today and your tests are completely normal, you will receive your results only by: MyChart Message (if you have MyChart) OR A paper copy in the mail If you have any lab test that is abnormal or we need to change your treatment, we will call you to review the results.   Testing/Procedures: No testing ordered  Follow-Up: At  [facility], you and your health needs are our priority.  As part of our continuing mission to provide you with exceptional heart care, we have created designated Provider Care Teams.  These Care Teams include your primary Cardiologist (physician) and Advanced Practice Providers (APPs -  Physician Assistants and Nurse Practitioners) who all work together to provide you with the care you need, when you need it.  We recommend signing up for the patient portal called "MyChart".  Sign up information is provided on this After Visit Summary.  MyChart is used to connect with patients for Virtual Visits (Telemedicine).  Patients are able to view lab/test results, encounter notes, upcoming appointments, etc.  Non-urgent messages can be sent to your provider as well.   To learn more about what you can do with MyChart, go to [email or web address].    Your next appointment:   6 month(s)  Provider:   You may see Muhammad Arida, MD or one of the following Advanced Practice Providers on your designated Care Team:   Christopher Berge, NP Ryan Dunn, PA-C Cadence Furth, PA-C Sheri Hammock, NP  

## 2022-06-24 ENCOUNTER — Encounter (INDEPENDENT_AMBULATORY_CARE_PROVIDER_SITE_OTHER): Payer: Medicare Other

## 2022-06-24 ENCOUNTER — Ambulatory Visit (INDEPENDENT_AMBULATORY_CARE_PROVIDER_SITE_OTHER): Payer: No Typology Code available for payment source | Admitting: Nurse Practitioner

## 2022-06-25 ENCOUNTER — Encounter (INDEPENDENT_AMBULATORY_CARE_PROVIDER_SITE_OTHER): Payer: Medicare Other

## 2022-06-25 ENCOUNTER — Telehealth: Payer: Self-pay | Admitting: Cardiovascular Disease

## 2022-06-25 ENCOUNTER — Ambulatory Visit (INDEPENDENT_AMBULATORY_CARE_PROVIDER_SITE_OTHER): Payer: No Typology Code available for payment source | Admitting: Nurse Practitioner

## 2022-06-25 NOTE — Telephone Encounter (Signed)
   Pre-operative Risk Assessment    Patient Name: Alexandria Marquez  DOB: 1945/04/21 MRN: [id number]      Request for Surgical Clearance    Procedure:  Colonoscopy  Date of Surgery:  Clearance 11/16/22                                 Surgeon:  Dr. Andrey Farmer Surgeon's Group or Practice Name:  [facility] Phone number:  [phone] Fax number:  [phone]   Type of Clearance Requested: Pharmacy Clopidogrel hold 5 days    Type of Anesthesia:  Not Indicated   Additional requests/questions:    Signed, Maxwell Caul   06/25/2022, 10:43 AM

## 2022-06-25 NOTE — Telephone Encounter (Signed)
     Primary Cardiologist: Kathlyn Sacramento, MD  Chart reviewed as part of pre-operative protocol coverage. Given past medical history and time since last visit, based on ACC/AHA guidelines, Alexandria Marquez would be at acceptable risk for the planned procedure without further cardiovascular testing.   Her Plavix may be held for 3-5 days prior to her procedure.  Please resume as soon as hemostasis is achieved.  I will route this recommendation to the requesting party via Epic fax function and remove from pre-op pool.  Please call with questions.  Jossie Ng. Asherah Lavoy NP-C     06/25/2022, 12:12 PM [place] [address] Office [phone] Fax [phone]

## 2022-07-17 ENCOUNTER — Encounter
Admission: RE | Admit: 2022-07-17 | Discharge: 2022-07-17 | Disposition: A | Discharge status: Home / Self Care | Payer: Medicare Other | Source: Ambulatory Visit | Attending: Gastroenterology | Admitting: Gastroenterology

## 2022-07-17 DIAGNOSIS — R1013 Epigastric pain: Secondary | ICD-10-CM | POA: Diagnosis not present

## 2022-07-17 MED ORDER — TECHNETIUM TC 99M SULFUR COLLOID
2.0000 | Freq: Once | INTRAVENOUS | Status: AC
  Administered 2022-07-17: 2.19 via ORAL

## 2022-07-31 ENCOUNTER — Other Ambulatory Visit: Payer: Self-pay | Admitting: Physician Assistant

## 2022-07-31 DIAGNOSIS — Z1231 Encounter for screening mammogram for malignant neoplasm of breast: Secondary | ICD-10-CM

## 2022-08-26 ENCOUNTER — Telehealth: Payer: Self-pay | Admitting: Cardiovascular Disease

## 2022-08-26 ENCOUNTER — Telehealth: Payer: Self-pay | Admitting: *Deleted

## 2022-08-26 NOTE — Telephone Encounter (Signed)
   Name: Alexandria Marquez  DOB: 12/01/1945  MRN: [id number]  Primary Cardiologist: Lorine Bears, MD   Preoperative team, please contact this patient and set up a phone call appointment for further preoperative risk assessment. Please obtain consent and complete medication review. Thank you for your help.  I confirm that guidance regarding antiplatelet and oral anticoagulation therapy has been completed and, if necessary, noted below.  Per office protocol, he may hold Plavix for 5 days prior to procedure and should resume as soon as hemodynamically stable postoperatively.    Carlos Levering, NP 08/26/2022, 1:13 PM [facility]

## 2022-08-26 NOTE — Telephone Encounter (Signed)
   Pre-operative Risk Assessment    Patient Name: Alexandria Marquez  DOB: [birth year] MRN: [id number]      Request for Surgical Clearance    Procedure:  Colonoscopy  Date of Surgery:  Clearance 11/16/22                                 Surgeon:  Dr. Eather Colas Surgeon's Group or Practice Name:  [facility] Phone number:  [phone] Fax number:  [phone]   Type of Clearance Requested:  Pharmacy, Plavix    Type of Anesthesia:  Not Indicated   Additional requests/questions:    Signed, Narda Amber   08/26/2022, 9:12 AM

## 2022-08-26 NOTE — Telephone Encounter (Signed)
Pt has been scheduled a tele pre op appt 11/02/22. Med rec and consent are done. I will update all parties involved.     Patient Consent for Virtual Visit        Alexandria Marquez has provided verbal consent on 08/26/2022 for a virtual visit (video or telephone).   CONSENT FOR VIRTUAL VISIT FOR:  Alexandria Marquez  By participating in this virtual visit I agree to the following:  I hereby voluntarily request, consent and authorize [facility] and its employed or contracted physicians, physician assistants, nurse practitioners or other licensed health care professionals (the Practitioner), to provide me with telemedicine health care services (the "Services") as deemed necessary by the treating Practitioner. I acknowledge and consent to receive the Services by the Practitioner via telemedicine. I understand that the telemedicine visit will involve communicating with the Practitioner through live audiovisual communication technology and the disclosure of certain medical information by electronic transmission. I acknowledge that I have been given the opportunity to request an in-person assessment or other available alternative prior to the telemedicine visit and am voluntarily participating in the telemedicine visit.  I understand that I have the right to withhold or withdraw my consent to the use of telemedicine in the course of my care at any time, without affecting my right to future care or treatment, and that the Practitioner or I may terminate the telemedicine visit at any time. I understand that I have the right to inspect all information obtained and/or recorded in the course of the telemedicine visit and may receive copies of available information for a reasonable fee.  I understand that some of the potential risks of receiving the Services via telemedicine include:  Delay or interruption in medical evaluation due to technological equipment failure or disruption; Information transmitted may not  be sufficient (e.g. poor resolution of images) to allow for appropriate medical decision making by the Practitioner; and/or  In rare instances, security protocols could fail, causing a breach of personal health information.  Furthermore, I acknowledge that it is my responsibility to provide information about my medical history, conditions and care that is complete and accurate to the best of my ability. I acknowledge that Practitioner's advice, recommendations, and/or decision may be based on factors not within their control, such as incomplete or inaccurate data provided by me or distortions of diagnostic images or specimens that may result from electronic transmissions. I understand that the practice of medicine is not an exact science and that Practitioner makes no warranties or guarantees regarding treatment outcomes. I acknowledge that a copy of this consent can be made available to me via my patient portal [facility] MyChart), or I can request a printed copy by calling the office of [facility].    I understand that my insurance will be billed for this visit.   I have read or had this consent read to me. I understand the contents of this consent, which adequately explains the benefits and risks of the Services being provided via telemedicine.  I have been provided ample opportunity to ask questions regarding this consent and the Services and have had my questions answered to my satisfaction. I give my informed consent for the services to be provided through the use of telemedicine in my medical care

## 2022-08-26 NOTE — Telephone Encounter (Signed)
Pt has been scheduled a tele pre op appt 11/02/22. Med rec and consent are done. I will update all parties involved.

## 2022-08-31 ENCOUNTER — Ambulatory Visit
Admission: RE | Admit: 2022-08-31 | Discharge: 2022-08-31 | Disposition: A | Discharge status: Home / Self Care | Payer: Medicare Other | Source: Ambulatory Visit | Attending: Physician Assistant | Admitting: Physician Assistant

## 2022-08-31 DIAGNOSIS — Z1231 Encounter for screening mammogram for malignant neoplasm of breast: Secondary | ICD-10-CM | POA: Insufficient documentation

## 2022-10-25 IMAGING — MG MM DIGITAL SCREENING BILAT W/ TOMO AND CAD
8 series · 8 of 24 positions shown · non-contrast
Comparison: Previous exam(s).

CLINICAL DATA: Screening.

EXAM:
DIGITAL SCREENING BILATERAL MAMMOGRAM WITH TOMOSYNTHESIS AND CAD
TECHNIQUE: Bilateral screening digital craniocaudal and mediolateral oblique
mammograms were obtained. Bilateral screening digital breast
tomosynthesis was performed. The images were evaluated with
computer-aided detection.

[R MLO synth-2D]
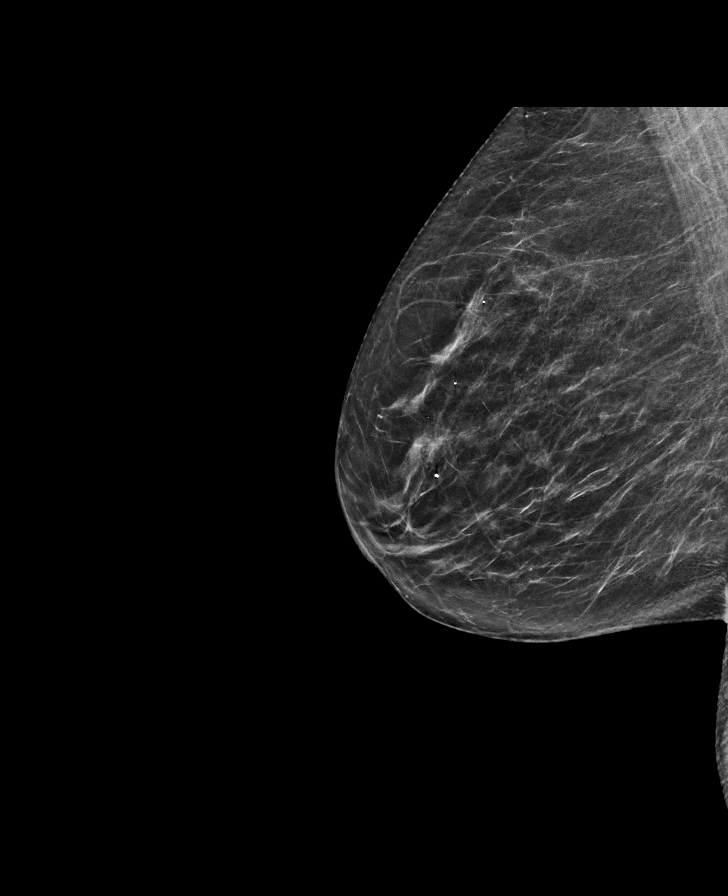

[R CC synth-2D]
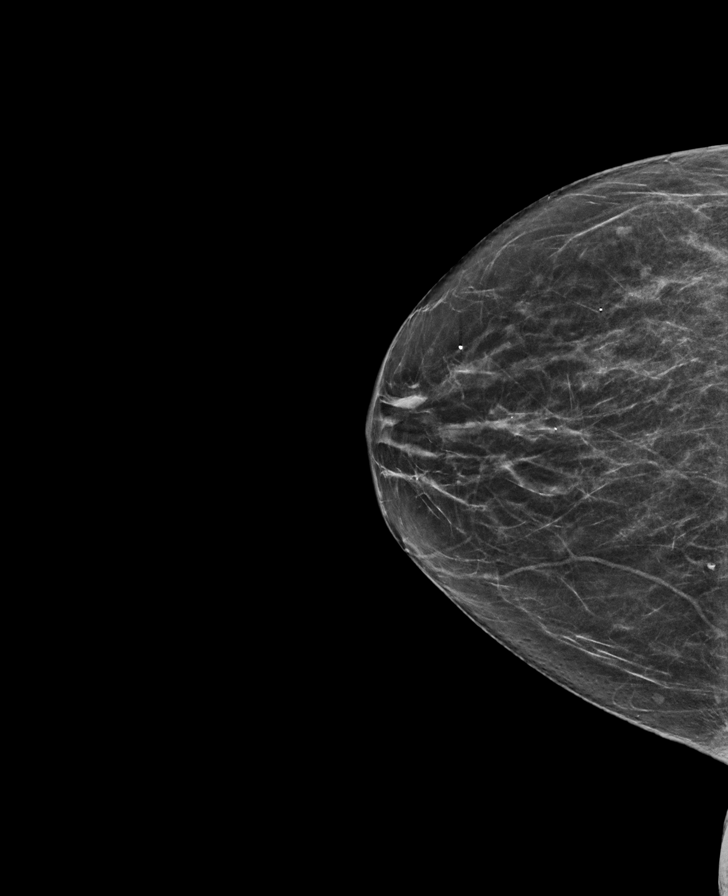

[L CC synth-2D]
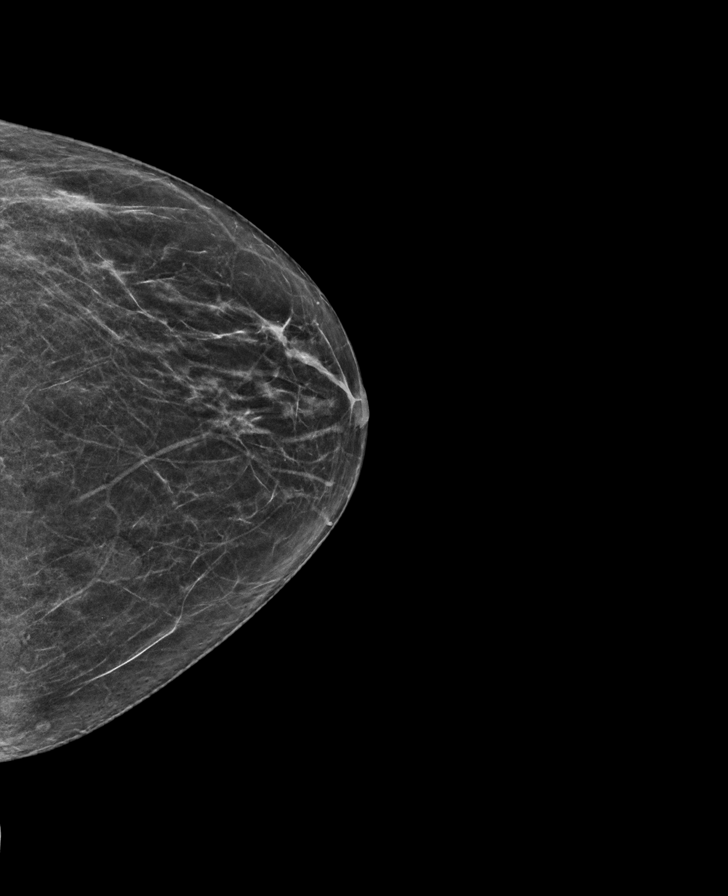

[L MLO synth-2D]
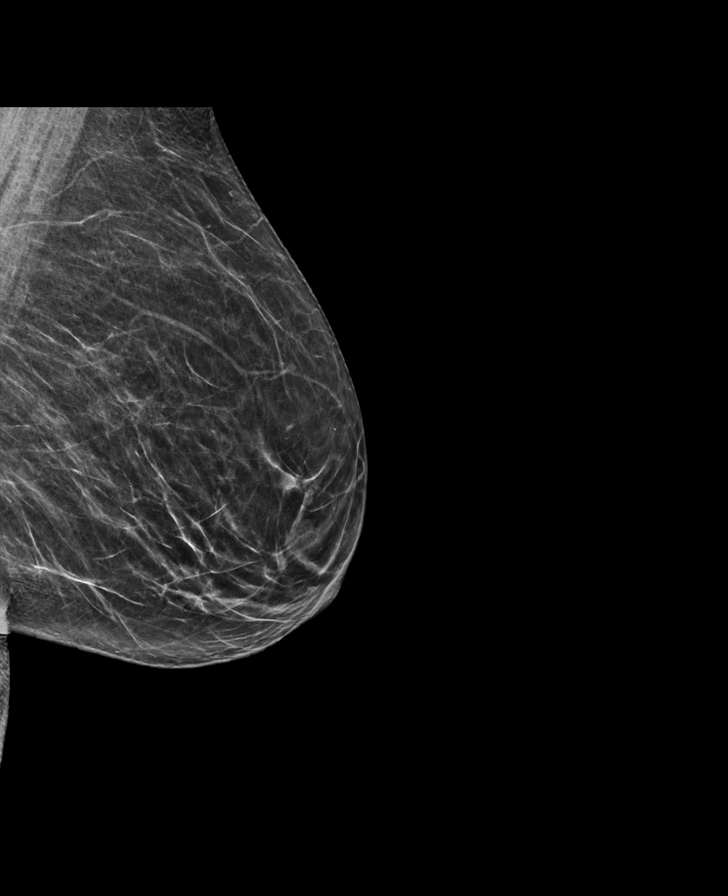

[R MLO tomo · tomo slice 31/60.0]
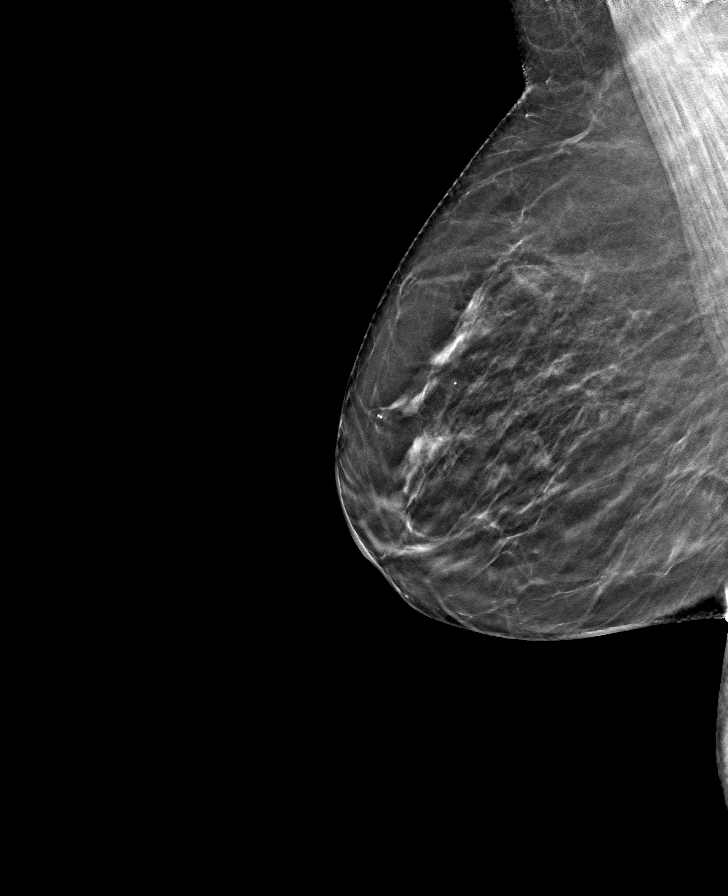

[L MLO tomo · tomo slice 29/58.0]
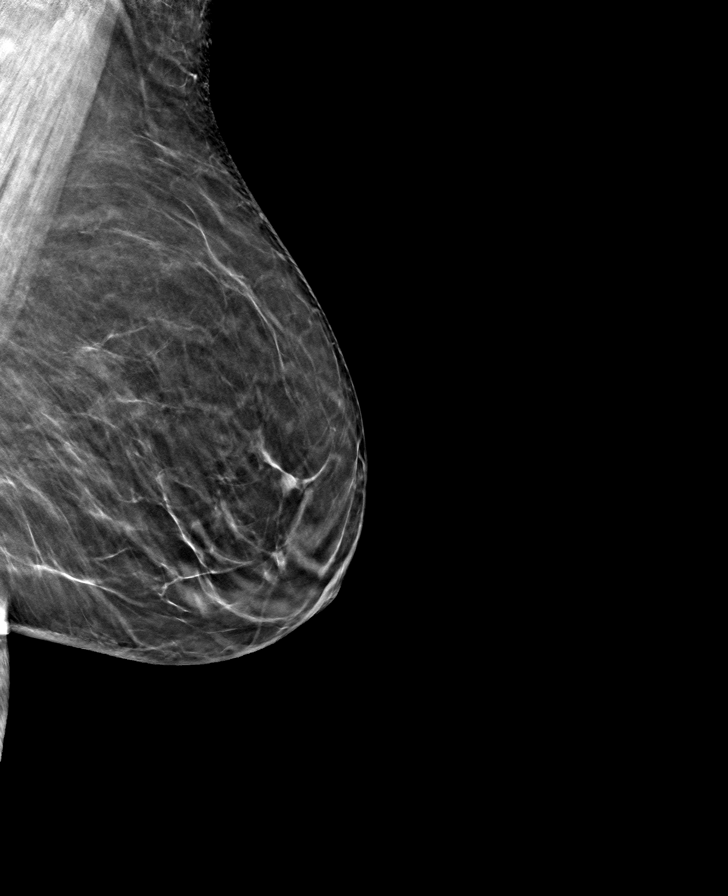

[L CC tomo · tomo slice 27/53.0]
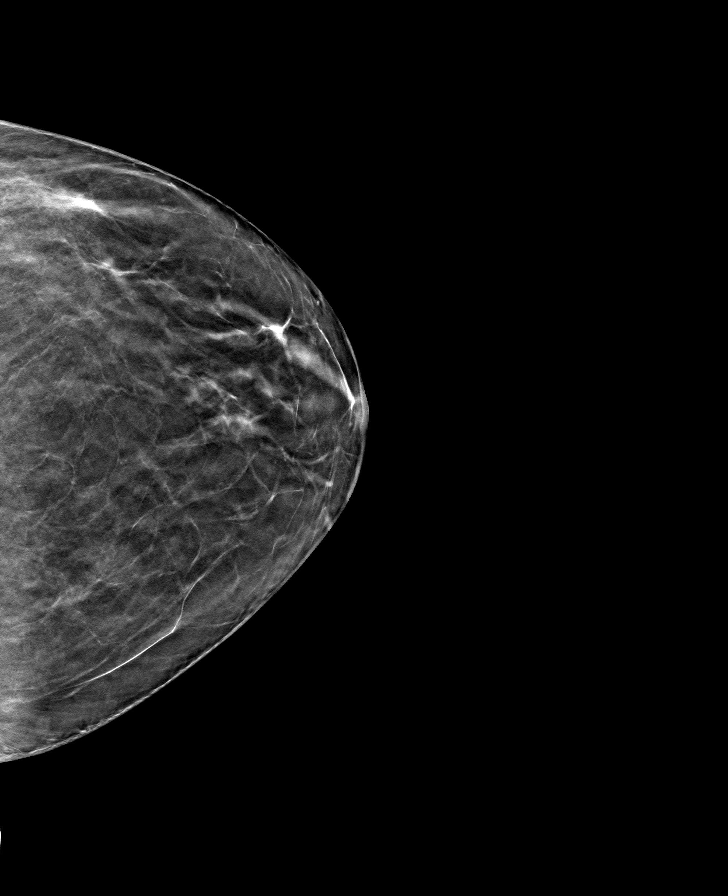

[R CC tomo · tomo slice 29/58.0]
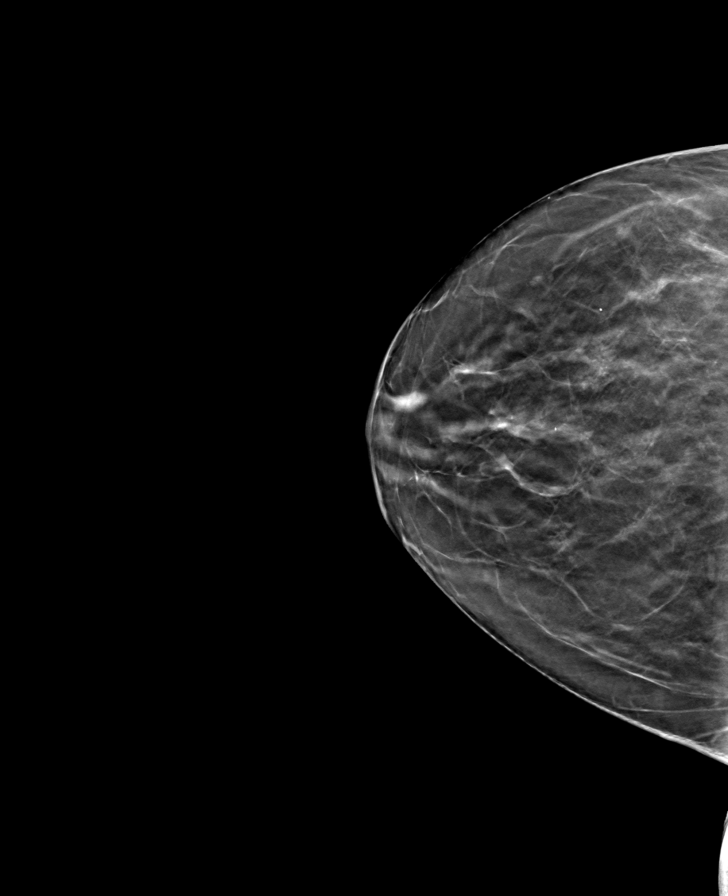

[8 of 24 positions shown; findings below may reference images not displayed]

ACR Breast Density Category b: There are scattered areas of
fibroglandular density.
FINDINGS: There are no findings suspicious for malignancy.
IMPRESSION: No mammographic evidence of malignancy. A result letter of this
screening mammogram will be mailed directly to the patient.

RECOMMENDATION:
Screening mammogram in one year. (Code:51-O-LD2)

BI-RADS CATEGORY  1: Negative.

## 2022-11-02 ENCOUNTER — Ambulatory Visit: Payer: Medicare Other | Attending: Cardiovascular Disease

## 2022-11-02 DIAGNOSIS — Z01810 Encounter for preprocedural cardiovascular examination: Secondary | ICD-10-CM

## 2022-11-02 NOTE — Progress Notes (Signed)
Virtual Visit via Telephone Note   Because of Alexandria Marquez's co-morbid illnesses, she is at least at moderate risk for complications without adequate follow up.  This format is felt to be most appropriate for this patient at this time.  The patient did not have access to video technology/had technical difficulties with video requiring transitioning to audio format only (telephone).  All issues noted in this document were discussed and addressed.  No physical exam could be performed with this format.  Please refer to the patient's chart for her consent to telehealth for [facility].  Evaluation Performed:  Preoperative cardiovascular risk assessment _____________   Date:  11/02/2022   Patient ID:  Alexandria Marquez, DOB 06/12/1945, MRN [id number] Patient Location:  Home Provider location:   Office  Primary Care Provider:  Patrice Paradise, MD Primary Cardiologist:  Lorine Bears, MD  Chief Complaint / Patient Profile   77 y.o. y/o female with a h/o CAD s/p CABG and subsequent RCA stenting, diastolic dysfunction, mild pulmonary HTN, DM, PAD s/p SMA stenting 05/2018. HTN, HLD, hypothyroidism, OSA, depression, anxiety, and GERD who is pending colonoscopy and presents today for telephonic preoperative cardiovascular risk assessment.  History of Present Illness    Alexandria Marquez is a 77 y.o. female who presents via audio/video conferencing for a telehealth visit today.  Pt was last seen in cardiology clinic on June 19, 2022 by Ward Givens, NP.  At that time Alexandria Marquez was doing well other than some dizziness and orthostatics were normal.  The patient is now pending procedure as outlined above. Since her last visit, she tells me that she has not had any cardiovascular symptoms to her knowledge.  Always has some shortness of breath due to exercise intolerance.  She does have some back issues as well as knee issues and balance issues.  This does hold her back from some physical activity but  she still does surpass 4 METS on the DASI.   Per office protocol, she may hold Plavix for 5 days prior to procedure and should resume as soon as hemodynamically stable postoperatively.   Reports no shortness of breath nor dyspnea on exertion. Reports no chest pain, pressure, or tightness. No edema, orthopnea, PND. Reports no palpitations.   Past Medical History    Past Medical History:  Diagnosis Date   Anxiety    Arthritis    Asthma    when young   Coronary artery disease    a. 2008 s/p CABG x 2 (LIMA->LAD, VG->Diag); b. 09/2019 PCI: LM min irregs, LAD 100p/m, 85/46m, LCX 60m/d, RCA 79m (2.0x18 Resolute Onyx DES), VG->D2 ok, LIMA->LAD 50p/d. EF 55-65%; c. 09/2020 MV: EF 83%, no isch/infarct.   Depression    Diabetes mellitus without complication (HCC)    Diastolic dysfunction    a. 08/2019 Echo: EF 60-65%, no rwma, mild LVH, Gr1 DD. RVSP 43.91mmHg. Mild MR, mild to mod TR. AoV sclerosis.   Environmental and seasonal allergies    GERD (gastroesophageal reflux disease)    History of hiatal hernia    Hyperlipidemia    Hypertension    Hypothyroid    Hypothyroidism    Lower extremity edema    Mesenteric ischemia (HCC)    a. 05/2022 s/p Stent to the Celiac artery with 7 mm diameter x 16 mm length balloon.   Motion sickness    OSA (obstructive sleep apnea) 06/04/2014   Osteoarthritis    Scoliosis    Shortness of breath dyspnea  doe   Vertigo    Past Surgical History:  Procedure Laterality Date   ABDOMINAL HYSTERECTOMY     APPENDECTOMY     BACK SURGERY  2014   lumbar fusion/plate/rods/screws   CARDIAC CATHETERIZATION     CATARACT EXTRACTION W/PHACO Left 08/28/2020   Procedure: CATARACT EXTRACTION PHACO AND INTRAOCULAR LENS PLACEMENT (IOC) LEFT DIABETIC;  Surgeon: Lockie Mola, MD;  Location: [facility];  Service: Ophthalmology;  Laterality: Left;  6.59 0:53.9 12.2%   CATARACT EXTRACTION W/PHACO Right 09/11/2020   Procedure: CATARACT EXTRACTION PHACO AND  INTRAOCULAR LENS PLACEMENT (IOC) RIGHT DIABETIC;  Surgeon: Lockie Mola, MD;  Location: [facility];  Service: Ophthalmology;  Laterality: Right;  4.88 1:01.8   CHOLECYSTECTOMY  2005   COLONOSCOPY WITH PROPOFOL N/A 01/18/2017   Procedure: COLONOSCOPY WITH PROPOFOL;  Surgeon: Christena Deem, MD;  Location: [facility] END[facility];  Service: Endoscopy;  Laterality: N/A;   CORONARY ARTERY BYPASS GRAFT     2008 2 vessels   CORONARY ARTERY BYPASS GRAFT  2008   CORONARY STENT INTERVENTION  10/18/2019   CORONARY STENT INTERVENTION N/A 10/18/2019   Procedure: CORONARY STENT INTERVENTION;  Surgeon: Iran Ouch, MD;  Location: [facility];  Service: Cardiovascular;  Laterality: N/A;   ESOPHAGOGASTRODUODENOSCOPY (EGD) WITH PROPOFOL N/A 05/16/2018   Procedure: ESOPHAGOGASTRODUODENOSCOPY (EGD) WITH PROPOFOL;  Surgeon: Christena Deem, MD;  Location[facility];  Service: Endoscopy;  Laterality: N/A;   ESOPHAGOGASTRODUODENOSCOPY (EGD) WITH PROPOFOL N/A 11/05/2020   Procedure: ESOPHAGOGASTRODUODENOSCOPY (EGD) WITH PROPOFOL;  Surgeon: Regis Bill, MD;  Location: [facility] ENDOSCOPY;  Service: Endoscopy;  Laterality: N/A;  DM   FRACTURE SURGERY     HERNIA REPAIR  1987   incisional   JOINT REPLACEMENT Right 2017   knee   ORIF ANKLE FRACTURE Left 06/19/2016   Procedure: OPEN REDUCTION INTERNAL FIXATION (ORIF) ANKLE FRACTURE;  Surgeon: Juanell Fairly, MD;  Location: [facility] ORS;  Service: Orthopedics;  Laterality: Left;   rcr     RIGHT/LEFT HEART CATH AND CORONARY/GRAFT ANGIOGRAPHY N/A 10/18/2019   Procedure: RIGHT/LEFT HEART CATH AND CORONARY/GRAFT ANGIOGRAPHY;  Surgeon: Iran Ouch, MD;  Location: [facility];  Service: Cardiovascular;  Laterality: N/A;   ROTATOR CUFF REPAIR Left 2013   TONSILLECTOMY     TOTAL KNEE ARTHROPLASTY Right 12/19/2015   Procedure: TOTAL KNEE ARTHROPLASTY;  Surgeon: Christena Flake, MD;  Location: [facility] ORS;  Service: Orthopedics;  Laterality:  Right;   TUBAL LIGATION     VISCERAL ANGIOGRAPHY N/A 05/23/2018   Procedure: VISCERAL ANGIOGRAPHY;  Surgeon: Annice Needy, MD;  Location: [facility] INVAS[facility] CV LAB;  Service: Cardiovascular;  Laterality: N/A;   VISCERAL ANGIOGRAPHY N/A 05/25/2022   Procedure: VISCERAL ANGIOGRAPHY;  Surgeon: Annice Needy, MD;  Location: [facility] CV [facility];  Service: Cardiovascular;  Laterality: N/A;    Allergies  Allergies  Allergen Reactions   Simethicone Anaphylaxis and Hives   Morphine And Codeine Nausea And Vomiting    Severe n & v. Any other pain medications are okay   Penicillins Hives    Has patient had a PCN reaction causing immediate rash, facial/tongue/throat swelling, SOB or lightheadedness with hypotension: No Has patient had a PCN reaction causing severe rash involving mucus membranes or skin necrosis: No Has patient had a PCN reaction that required hospitalization No Has patient had a PCN reaction occurring within the last 10 years: No If all of the above answers are "NO", then may proceed with Cephalosporin use.     Home  Medications    Prior to Admission medications   Medication Sig Start Date End Date Taking? Authorizing Provider  acetaminophen (TYLENOL) 500 MG tablet Take 1,000 mg by mouth every 6 (six) hours as needed for moderate pain or mild pain.    [provider]  amLODipine (NORVASC) 5 MG tablet Take 1 tablet (5 mg total) by mouth 2 (two) times daily. 10/19/19   Marcelino Duster, PA  aspirin 81 MG tablet Take 1 tablet (81 mg total) by mouth daily. 10/05/16   Enid Baas, MD  atorvastatin (LIPITOR) 80 MG tablet TAKE 1 TABLET BY MOUTH EVERYDAY AT BEDTIME 11/04/21   Iran Ouch, MD  clopidogrel (PLAVIX) 75 MG tablet TAKE 1 TABLET BY MOUTH EVERY DAY WITH BREAKFAST 04/29/22   Iran Ouch, MD  enalapril (VASOTEC) 10 MG tablet Take 1 tablet (10 mg total) by mouth 2 (two) times daily. 06/19/22   Creig Hines, NP  ezetimibe (ZETIA) 10 MG tablet TAKE 1  TABLET BY MOUTH EVERY DAY 01/15/22   Iran Ouch, MD  fluticasone (FLONASE) 50 MCG/ACT nasal spray Place 1-2 sprays into both nostrils daily as needed for allergies.     [provider]  gabapentin (NEURONTIN) 300 MG capsule Take 600 mg by mouth at bedtime.    [provider]  glimepiride (AMARYL) 2 MG tablet Take 2 mg by mouth daily with breakfast.    [provider]  glucose blood (ONETOUCH ULTRA) test strip USE 2 (TWO) TIMES DAILY. USE AS INSTRUCTED. 07/20/17   [provider]  insulin detemir (LEVEMIR) 100 UNIT/ML injection Inject 27 Units into the skin daily.     [provider]  levothyroxine (SYNTHROID) 88 MCG tablet Take by mouth. 12/07/19   [provider]  metFORMIN (GLUCOPHAGE) 1000 MG tablet Take 1 tablet (1,000 mg total) by mouth 2 (two) times daily with a meal. Resume on 10/21/19. 10/19/19   Marcelino Duster, PA  nitroGLYCERIN (NITROSTAT) 0.4 MG SL tablet Place 1 tablet (0.4 mg total) under the tongue every 5 (five) minutes as needed for chest pain. 10/19/19   [facility], Roe Rutherford, PA  ondansetron (ZOFRAN-ODT) 4 MG disintegrating tablet Take 4 mg by mouth every 8 (eight) hours as needed for nausea or vomiting.    [provider]  pantoprazole (PROTONIX) 40 MG tablet Take 40 mg by mouth daily.     [provider]  Polyethyl Glycol-Propyl Glycol 0.4-0.3 % SOLN Place 1 drop into both eyes 3 (three) times daily as needed (for dry eyes).    [provider]  sertraline (ZOLOFT) 100 MG tablet Take 100 mg by mouth daily.    [provider]  tiZANidine (ZANAFLEX) 2 MG tablet Take 2 mg by mouth every 6 (six) hours as needed for muscle spasms.    [provider]  VITAMIN D PO Take 1 tablet by mouth daily.    [provider]    Physical Exam    Vital Signs:  Alexandria Marquez does not have vital signs available for review today.  Given telephonic nature of communication, physical exam is  limited. AAOx3. NAD. Normal affect.  Speech and respirations are unlabored.  Accessory Clinical Findings    None  Assessment & Plan    1.  Preoperative Cardiovascular Risk Assessment:  Alexandria Marquez perioperative risk of a major cardiac event is 0.9% according to the Revised Cardiac Risk Index (RCRI).  Therefore, she is at low risk for perioperative complications.   Her functional  capacity is good at 5.07 METs according to the [facility] Activity Status Index (DASI). Recommendations: According to ACC/AHA guidelines, no further cardiovascular testing needed.  The patient may proceed to surgery at acceptable risk.   Antiplatelet and/or Anticoagulation Recommendations: Clopidogrel (Plavix) can be held for 5 days prior to her surgery and resumed as soon as possible post op.    The patient was advised that if she develops new symptoms prior to surgery to contact our office to arrange for a follow-up visit, and she verbalized understanding.   A copy of this note will be routed to requesting surgeon.  Time:   Today, I have spent 8 minutes with the patient with telehealth technology discussing medical history, symptoms, and management plan.     Sharlene Dory, PA-C  11/02/2022, 1:59 PM

## 2022-11-11 ENCOUNTER — Other Ambulatory Visit: Payer: Self-pay

## 2022-11-16 ENCOUNTER — Encounter
Admission: RE | Disposition: A | Discharge status: Home / Self Care | Payer: Self-pay | Source: Home / Self Care | Attending: Gastroenterology

## 2022-11-16 ENCOUNTER — Encounter: Payer: Self-pay | Admitting: *Deleted

## 2022-11-16 ENCOUNTER — Ambulatory Visit: Payer: Medicare Other | Admitting: Anesthesiology

## 2022-11-16 ENCOUNTER — Other Ambulatory Visit: Payer: Self-pay

## 2022-11-16 ENCOUNTER — Ambulatory Visit
Admission: RE | Admit: 2022-11-16 | Discharge: 2022-11-16 | Disposition: A | Discharge status: Home / Self Care | Payer: Medicare Other | Attending: Gastroenterology | Admitting: Gastroenterology

## 2022-11-16 DIAGNOSIS — E119 Type 2 diabetes mellitus without complications: Secondary | ICD-10-CM | POA: Diagnosis not present

## 2022-11-16 DIAGNOSIS — K640 First degree hemorrhoids: Secondary | ICD-10-CM | POA: Diagnosis not present

## 2022-11-16 DIAGNOSIS — G4730 Sleep apnea, unspecified: Secondary | ICD-10-CM | POA: Insufficient documentation

## 2022-11-16 DIAGNOSIS — R194 Change in bowel habit: Secondary | ICD-10-CM | POA: Insufficient documentation

## 2022-11-16 DIAGNOSIS — K449 Diaphragmatic hernia without obstruction or gangrene: Secondary | ICD-10-CM | POA: Insufficient documentation

## 2022-11-16 DIAGNOSIS — Z7902 Long term (current) use of antithrombotics/antiplatelets: Secondary | ICD-10-CM | POA: Insufficient documentation

## 2022-11-16 DIAGNOSIS — E039 Hypothyroidism, unspecified: Secondary | ICD-10-CM | POA: Diagnosis not present

## 2022-11-16 DIAGNOSIS — D123 Benign neoplasm of transverse colon: Secondary | ICD-10-CM | POA: Insufficient documentation

## 2022-11-16 DIAGNOSIS — I2510 Atherosclerotic heart disease of native coronary artery without angina pectoris: Secondary | ICD-10-CM | POA: Diagnosis not present

## 2022-11-16 DIAGNOSIS — K5730 Diverticulosis of large intestine without perforation or abscess without bleeding: Secondary | ICD-10-CM | POA: Insufficient documentation

## 2022-11-16 DIAGNOSIS — K439 Ventral hernia without obstruction or gangrene: Secondary | ICD-10-CM | POA: Diagnosis not present

## 2022-11-16 DIAGNOSIS — D124 Benign neoplasm of descending colon: Secondary | ICD-10-CM | POA: Insufficient documentation

## 2022-11-16 DIAGNOSIS — K219 Gastro-esophageal reflux disease without esophagitis: Secondary | ICD-10-CM | POA: Insufficient documentation

## 2022-11-16 DIAGNOSIS — I10 Essential (primary) hypertension: Secondary | ICD-10-CM | POA: Diagnosis not present

## 2022-11-16 DIAGNOSIS — K55059 Acute (reversible) ischemia of intestine, part and extent unspecified: Secondary | ICD-10-CM | POA: Insufficient documentation

## 2022-11-16 HISTORY — PX: COLONOSCOPY WITH PROPOFOL: SHX5780

## 2022-11-16 HISTORY — PX: POLYPECTOMY: SHX5525

## 2022-11-16 LAB — GLUCOSE, CAPILLARY: Glucose-Capillary: 161 mg/dL — ABNORMAL HIGH (ref 70–99)

## 2022-11-16 SURGERY — COLONOSCOPY WITH PROPOFOL
Anesthesia: General

## 2022-11-16 MED ORDER — LIDOCAINE HCL (CARDIAC) PF 100 MG/5ML IV SOSY
PREFILLED_SYRINGE | INTRAVENOUS | Status: DC | PRN
  Administered 2022-11-16: 50 mg via INTRAVENOUS

## 2022-11-16 MED ORDER — PROPOFOL 500 MG/50ML IV EMUL
INTRAVENOUS | Status: DC | PRN
  Administered 2022-11-16: 100 ug/kg/min via INTRAVENOUS

## 2022-11-16 MED ORDER — SODIUM CHLORIDE 0.9 % IV SOLN: INTRAVENOUS | Status: DC

## 2022-11-16 MED ORDER — PROPOFOL 10 MG/ML IV BOLUS
INTRAVENOUS | Status: DC | PRN
  Administered 2022-11-16 (×2): 50 mg via INTRAVENOUS

## 2022-11-16 NOTE — Anesthesia Preprocedure Evaluation (Signed)
Anesthesia Evaluation  Patient identified by MRN, date of birth, ID band Patient awake    Reviewed: Allergy & Precautions, NPO status , Patient's Chart, lab work & pertinent test results  Airway Mallampati: III  TM Distance: <3 FB Neck ROM: full    Dental  (+) Chipped, Poor Dentition   Pulmonary shortness of breath and with exertion, asthma , sleep apnea    Pulmonary exam normal        Cardiovascular Exercise Tolerance: Good hypertension, (-) angina + CAD  Normal cardiovascular exam     Neuro/Psych  PSYCHIATRIC DISORDERS       Neuromuscular disease    GI/Hepatic Neg liver ROS, hiatal hernia,GERD  Controlled,,  Endo/Other  diabetesHypothyroidism    Renal/GU negative Renal ROS  negative genitourinary   Musculoskeletal   Abdominal   Peds  Hematology negative hematology ROS (+)   Anesthesia Other Findings Past Medical History: No date: Anxiety No date: Arthritis No date: Asthma     Comment:  when young No date: Coronary artery disease     Comment:  a. 2008 s/p CABG x 2 (LIMA->LAD, VG->Diag); b. 09/2019               PCI: LM min irregs, LAD 100p/m, 85/41m, LCX 54m/d, RCA               84m (2.0x18 Resolute Onyx DES), VG->D2 ok, LIMA->LAD               50p/d. EF 55-65%; c. 09/2020 MV: EF 83%, no isch/infarct. No date: Depression No date: Diabetes mellitus without complication (HCC) No date: Diastolic dysfunction     Comment:  a. 08/2019 Echo: EF 60-65%, no rwma, mild LVH, Gr1 DD.               RVSP 43.48mmHg. Mild MR, mild to mod TR. AoV sclerosis. No date: Environmental and seasonal allergies No date: GERD (gastroesophageal reflux disease) No date: History of hiatal hernia No date: Hyperlipidemia No date: Hypertension No date: Hypothyroid No date: Hypothyroidism No date: Lower extremity edema No date: Mesenteric ischemia (HCC)     Comment:  a. 05/2022 s/p Stent to the Celiac artery with 7 mm               diameter  x 16 mm length balloon. No date: Motion sickness 06/04/2014: OSA (obstructive sleep apnea) No date: Osteoarthritis No date: Scoliosis No date: Shortness of breath dyspnea     Comment:  doe No date: Vertigo  Past Surgical History: No date: ABDOMINAL HYSTERECTOMY No date: APPENDECTOMY 2014: BACK SURGERY     Comment:  lumbar fusion/plate/rods/screws No date: CARDIAC CATHETERIZATION 08/28/2020: CATARACT EXTRACTION W/PHACO; Left     Comment:  Procedure: CATARACT EXTRACTION PHACO AND INTRAOCULAR               LENS PLACEMENT (IOC) LEFT DIABETIC;  Surgeon: Lockie Mola, MD;  Location: [facility];  Service:               Ophthalmology;  Laterality: Left;  6.59 0:53.9 12.2% 09/11/2020: CATARACT EXTRACTION W/PHACO; Right     Comment:  Procedure: CATARACT EXTRACTION PHACO AND INTRAOCULAR               LENS PLACEMENT (IOC) RIGHT DIABETIC;  Surgeon:               Lockie Mola, MD;  Location: [facility];  Service: Ophthalmology;  Laterality: Right;  4.88 1:01.8 2005: CHOLECYSTECTOMY 01/18/2017: COLONOSCOPY WITH PROPOFOL; N/A     Comment:  Procedure: COLONOSCOPY WITH PROPOFOL;  Surgeon:               Christena Deem, MD;  Location: [facility] ENDOSCOPY;                Service: Endoscopy;  Laterality: N/A; No date: CORONARY ARTERY BYPASS GRAFT     Comment:  2008 2 vessels 2008: CORONARY ARTERY BYPASS GRAFT 10/18/2019: CORONARY STENT INTERVENTION 10/18/2019: CORONARY STENT INTERVENTION; N/A     Comment:  Procedure: CORONARY STENT INTERVENTION;  Surgeon: Iran Ouch, MD;  Location: [facility];  Service:               Cardiovascular;  Laterality: N/A; 05/16/2018: ESOPHAGOGASTRODUODENOSCOPY (EGD) WITH PROPOFOL; N/A     Comment:  Procedure: ESOPHAGOGASTRODUODENOSCOPY (EGD) WITH               PROPOFOL;  Surgeon: Christena Deem, MD;  Location:               [facility] ENDOSCOPY;  Service: Endoscopy;  Laterality:  N/A; 11/05/2020: ESOPHAGOGASTRODUODENOSCOPY (EGD) WITH PROPOFOL; N/A     Comment:  Procedure: ESOPHAGOGASTRODUODENOSCOPY (EGD) WITH               PROPOFOL;  Surgeon: Regis Bill, MD;  Location:               [facility] ENDOSCOPY;  Service: Endoscopy;  Laterality: N/A;                DM No date: FRACTURE SURGERY 1987: HERNIA REPAIR     Comment:  incisional 2017: JOINT REPLACEMENT; Right     Comment:  knee 06/19/2016: ORIF ANKLE FRACTURE; Left     Comment:  Procedure: OPEN REDUCTION INTERNAL FIXATION (ORIF) ANKLE              FRACTURE;  Surgeon: Juanell Fairly, MD;  Location: [facility]               ORS;  Service: Orthopedics;  Laterality: Left; No date: rcr 10/18/2019: RIGHT/LEFT HEART CATH AND CORONARY/GRAFT ANGIOGRAPHY; N/A     Comment:  Procedure: RIGHT/LEFT HEART CATH AND CORONARY/GRAFT               ANGIOGRAPHY;  Surgeon: Iran Ouch, MD;  Location:               [facility];  Service: Cardiovascular;                Laterality: N/A; 2013: ROTATOR CUFF REPAIR; Left No date: TONSILLECTOMY 12/19/2015: TOTAL KNEE ARTHROPLASTY; Right     Comment:  Procedure: TOTAL KNEE ARTHROPLASTY;  Surgeon: Christena Flake, MD;  Location: [facility] ORS;  Service: Orthopedics;                Laterality: Right; No date: TUBAL LIGATION 05/23/2018: VISCERAL ANGIOGRAPHY; N/A     Comment:  Procedure: VISCERAL ANGIOGRAPHY;  Surgeon: Annice Needy,              MD;  Location: [facility] IN[facility] CV [facility];  Service:               Cardiovascular;  Laterality: N/A; 05/25/2022: VISCERAL ANGIOGRAPHY; N/A     Comment:  Procedure: VISCERAL ANGIOGRAPHY;  Surgeon: Annice Needy,              MD;  Location: [facility] IN[facility] CV [facility];  Service:               Cardiovascular;  Laterality: N/A;  BMI    Body Mass Index: 32.92 kg/m      Reproductive/Obstetrics negative OB ROS                             Anesthesia Physical Anesthesia Plan  ASA: 2  Anesthesia Plan: General   Post-op Pain  Management:    Induction: Intravenous  PONV Risk Score and Plan: Propofol infusion and TIVA  Airway Management Planned: Natural Airway and Nasal Cannula  Additional Equipment:   Intra-op Plan:   Post-operative Plan:   Informed Consent: I have reviewed the patients History and Physical, chart, labs and discussed the procedure including the risks, benefits and alternatives for the proposed anesthesia with the patient or authorized representative who has indicated his/her understanding and acceptance.     Dental Advisory Given  Plan Discussed with: Anesthesiologist, CRNA and Surgeon  Anesthesia Plan Comments: (Patient consented for risks of anesthesia including but not limited to:  - adverse reactions to medications - risk of airway placement if required - damage to eyes, teeth, lips or other oral mucosa - nerve damage due to positioning  - sore throat or hoarseness - Damage to heart, brain, nerves, lungs, other parts of body or loss of life  Patient voiced understanding.)       Anesthesia Quick Evaluation

## 2022-11-16 NOTE — Transfer of Care (Signed)
Immediate Anesthesia Transfer of Care Note  Patient: Alexandria Marquez  Procedure(s) Performed: COLONOSCOPY WITH PROPOFOL POLYPECTOMY  Patient Location: PACU  Anesthesia Type:General  Level of Consciousness: drowsy  Airway & Oxygen Therapy: Patient Spontanous Breathing  Post-op Assessment: Report given to RN and Post -op Vital signs reviewed and stable  Post vital signs: Reviewed and stable  Last Vitals:  Vitals Value Taken Time  BP 150/73 11/16/22 0926  Temp    Pulse 59 11/16/22 0926  Resp 15 11/16/22 0926  SpO2 100 % 11/16/22 0926  Vitals shown include unfiled device data.  Last Pain:  Vitals:   11/16/22 0857  TempSrc:   PainSc: 0-No pain         Complications: No notable events documented.

## 2022-11-16 NOTE — Op Note (Signed)
[facility] Gastroenterology Patient Name: Alexandria Marquez Procedure Date: 11/16/2022 8:42 AM MRN: [id number] Account #: [id number] Date of Birth: 1945/09/24 Admit Type: Outpatient Age: 77 Room: [facility] ENDO ROOM 3 Gender: Female Note Status: Finalized Instrument Name: Nelda Marseille [id number] Procedure:             Colonoscopy Indications:           Change in bowel habits Providers:             Eather Colas MD, MD Medicines:             Monitored Anesthesia Care Complications:         No immediate complications. Estimated blood loss:                         Minimal. Procedure:             Pre-Anesthesia Assessment:                        - Prior to the procedure, a History and Physical was                         performed, and patient medications and allergies were                         reviewed. The patient is competent. The risks and                         benefits of the procedure and the sedation options and                         risks were discussed with the patient. All questions                         were answered and informed consent was obtained.                         Patient identification and proposed procedure were                         verified by the physician, the nurse, the                         anesthesiologist, the anesthetist and the technician                         in the endoscopy suite. Mental Status Examination:                         alert and oriented. Airway Examination: normal                         oropharyngeal airway and neck mobility. Respiratory                         Examination: clear to auscultation. CV Examination:                         normal. Prophylactic Antibiotics: The patient does not  require prophylactic antibiotics. Prior                         Anticoagulants: The patient has taken no anticoagulant                         or antiplatelet agents. ASA Grade Assessment: II - A                          patient with mild systemic disease. After reviewing                         the risks and benefits, the patient was deemed in                         satisfactory condition to undergo the procedure. The                         anesthesia plan was to use monitored anesthesia care                         (MAC). Immediately prior to administration of                         medications, the patient was re-assessed for adequacy                         to receive sedatives. The heart rate, respiratory                         rate, oxygen saturations, blood pressure, adequacy of                         pulmonary ventilation, and response to care were                         monitored throughout the procedure. The physical                         status of the patient was re-assessed after the                         procedure.                        After obtaining informed consent, the colonoscope was                         passed under direct vision. Throughout the procedure,                         the patient's blood pressure, pulse, and oxygen                         saturations were monitored continuously. The                         Colonoscope was introduced through the anus and  advanced to the the terminal ileum. The colonoscopy                         was performed without difficulty. The patient                         tolerated the procedure well. The quality of the bowel                         preparation was excellent. The terminal ileum,                         ileocecal valve, appendiceal orifice, and rectum were                         photographed. Findings:      The perianal and digital rectal examinations were normal.      The terminal ileum appeared normal.      Two sessile polyps were found in the transverse colon. The polyps were 2       mm in size. These polyps were removed with a jumbo cold forceps.       Resection and retrieval  were complete. Estimated blood loss was minimal.      A 3 mm polyp was found in the descending colon. The polyp was sessile.       The polyp was removed with a cold snare. Resection and retrieval were       complete. Estimated blood loss was minimal.      A single small-mouthed diverticulum was found in the sigmoid colon.      Internal hemorrhoids were found during retroflexion. The hemorrhoids       were Grade I (internal hemorrhoids that do not prolapse).      The exam was otherwise without abnormality on direct and retroflexion       views. Impression:            - The examined portion of the ileum was normal.                        - Two 2 mm polyps in the transverse colon, removed                         with a jumbo cold forceps. Resected and retrieved.                        - One 3 mm polyp in the descending colon, removed with                         a cold snare. Resected and retrieved.                        - Diverticulosis in the sigmoid colon.                        - Internal hemorrhoids.                        - The examination was otherwise normal on direct and  retroflexion views. Recommendation:        - Discharge patient to home.                        - Resume previous diet.                        - Continue present medications.                        - Await pathology results.                        - Repeat colonoscopy is not recommended due to current                         age (27 years or older) for surveillance.                        - Return to referring physician as previously                         scheduled. Procedure Code(s):     --- Professional ---                        [phone], Colonoscopy, flexible; with removal of                         tumor(s), polyp(s), or other lesion(s) by snare                         technique                        45380, 59, Colonoscopy, flexible; with biopsy, single                         or  multiple Diagnosis Code(s):     --- Professional ---                        K64.0, First degree hemorrhoids                        D12.3, Benign neoplasm of transverse colon (hepatic                         flexure or splenic flexure)                        D12.4, Benign neoplasm of descending colon                        R19.4, Change in bowel habit                        K57.30, Diverticulosis of large intestine without                         perforation or abscess without bleeding CPT copyright 2022 [employer]. All rights reserved. The codes documented in this report are preliminary and upon coder review may  be revised to  meet current compliance requirements. Eather Colas MD, MD 11/16/2022 9:29:44 AM Number of Addenda: 0 Note Initiated On: 11/16/2022 8:42 AM Scope Withdrawal Time: 0 hours 9 minutes 9 seconds  Total Procedure Duration: 0 hours 16 minutes 38 seconds  Estimated Blood Loss:  Estimated blood loss was minimal.      [facility]

## 2022-11-16 NOTE — Anesthesia Procedure Notes (Signed)
Procedure Name: General with mask airway Date/Time: 11/16/2022 9:00 AM  Performed by: Lily Lovings, CRNAPre-anesthesia Checklist: Patient identified, Emergency Drugs available, Suction available, Patient being monitored and Timeout performed Patient Re-evaluated:Patient Re-evaluated prior to induction Oxygen Delivery Method: Simple face mask Preoxygenation: Pre-oxygenation with 100% oxygen Induction Type: IV induction

## 2022-11-16 NOTE — H&P (Signed)
Outpatient short stay form Pre-procedure 11/16/2022  Regis Bill, MD  Primary Physician: Patrice Paradise, MD  Reason for visit:  Irregular bowel habits  History of present illness:    77 y/o lady with history of hypertension, hypothyroidism, mesenteric ischemia, and DM II here for colonoscopy for irregular bowel habits. Last colonoscopy in 2018 with proctitis attributed to mild ischemia. Last took plavix 5 days ago. History of hysterectomy. Notes ventral hernia that has not yet been repaired.    Current Facility-Administered Medications:    0.9 %  sodium chloride infusion, , Intravenous, Continuous, Alexys Lobello, Rossie Muskrat, MD  Medications Prior to Admission  Medication Sig Dispense Refill Last Dose   amLODipine (NORVASC) 5 MG tablet Take 1 tablet (5 mg total) by mouth 2 (two) times daily.   11/16/2022   atorvastatin (LIPITOR) 80 MG tablet TAKE 1 TABLET BY MOUTH EVERYDAY AT BEDTIME 90 tablet 3 11/15/2022   enalapril (VASOTEC) 10 MG tablet Take 1 tablet (10 mg total) by mouth 2 (two) times daily. 60 tablet 5 11/16/2022   ezetimibe (ZETIA) 10 MG tablet TAKE 1 TABLET BY MOUTH EVERY DAY 90 tablet 3 11/15/2022   gabapentin (NEURONTIN) 300 MG capsule Take 600 mg by mouth at bedtime.   11/15/2022   glimepiride (AMARYL) 2 MG tablet Take 2 mg by mouth daily with breakfast.   Past Week   levothyroxine (SYNTHROID) 88 MCG tablet Take by mouth.   11/16/2022   metFORMIN (GLUCOPHAGE) 1000 MG tablet Take 1 tablet (1,000 mg total) by mouth 2 (two) times daily with a meal. Resume on 10/21/19.   Past Week   pantoprazole (PROTONIX) 40 MG tablet Take 40 mg by mouth daily.    11/16/2022   sertraline (ZOLOFT) 100 MG tablet Take 100 mg by mouth daily.   11/15/2022   tiZANidine (ZANAFLEX) 2 MG tablet Take 2 mg by mouth every 6 (six) hours as needed for muscle spasms.   11/15/2022   acetaminophen (TYLENOL) 500 MG tablet Take 1,000 mg by mouth every 6 (six) hours as needed for moderate pain or mild pain.       aspirin 81 MG tablet Take 1 tablet (81 mg total) by mouth daily. 30 tablet 0 11/11/2022   clopidogrel (PLAVIX) 75 MG tablet TAKE 1 TABLET BY MOUTH EVERY DAY WITH BREAKFAST 90 tablet 0 11/11/2022   fluticasone (FLONASE) 50 MCG/ACT nasal spray Place 1-2 sprays into both nostrils daily as needed for allergies.       glucose blood (ONETOUCH ULTRA) test strip USE 2 (TWO) TIMES DAILY. USE AS INSTRUCTED.      insulin detemir (LEVEMIR) 100 UNIT/ML injection Inject 27 Units into the skin daily.       nitroGLYCERIN (NITROSTAT) 0.4 MG SL tablet Place 1 tablet (0.4 mg total) under the tongue every 5 (five) minutes as needed for chest pain. 25 tablet 3    ondansetron (ZOFRAN-ODT) 4 MG disintegrating tablet Take 4 mg by mouth every 8 (eight) hours as needed for nausea or vomiting.      Polyethyl Glycol-Propyl Glycol 0.4-0.3 % SOLN Place 1 drop into both eyes 3 (three) times daily as needed (for dry eyes).      VITAMIN D PO Take 1 tablet by mouth daily.        Allergies  Allergen Reactions   Simethicone Anaphylaxis and Hives   Morphine And Codeine Nausea And Vomiting    Severe n & v. Any other pain medications are okay   Penicillins Hives    Has  patient had a PCN reaction causing immediate rash, facial/tongue/throat swelling, SOB or lightheadedness with hypotension: No Has patient had a PCN reaction causing severe rash involving mucus membranes or skin necrosis: No Has patient had a PCN reaction that required hospitalization No Has patient had a PCN reaction occurring within the last 10 years: No If all of the above answers are "NO", then may proceed with Cephalosporin use.      Past Medical History:  Diagnosis Date   Anxiety    Arthritis    Asthma    when young   Coronary artery disease    a. 2008 s/p CABG x 2 (LIMA->LAD, VG->Diag); b. 09/2019 PCI: LM min irregs, LAD 100p/m, 85/28m, LCX 77m/d, RCA 44m (2.0x18 Resolute Onyx DES), VG->D2 ok, LIMA->LAD 50p/d. EF 55-65%; c. 09/2020 MV: EF 83%, no  isch/infarct.   Depression    Diabetes mellitus without complication (HCC)    Diastolic dysfunction    a. 08/2019 Echo: EF 60-65%, no rwma, mild LVH, Gr1 DD. RVSP 43.80mmHg. Mild MR, mild to mod TR. AoV sclerosis.   Environmental and seasonal allergies    GERD (gastroesophageal reflux disease)    History of hiatal hernia    Hyperlipidemia    Hypertension    Hypothyroid    Hypothyroidism    Lower extremity edema    Mesenteric ischemia (HCC)    a. 05/2022 s/p Stent to the Celiac artery with 7 mm diameter x 16 mm length balloon.   Motion sickness    OSA (obstructive sleep apnea) 06/04/2014   Osteoarthritis    Scoliosis    Shortness of breath dyspnea    doe   Vertigo     Review of systems:  Otherwise negative.    Physical Exam  Gen: Alert, oriented. Appears stated age.  HEENT: PERRLA. Lungs: No respiratory distress CV: RRR Abd: soft, benign, no masses Ext: No edema    Planned procedures: Proceed with colonoscopy. The patient understands the nature of the planned procedure, indications, risks, alternatives and potential complications including but not limited to bleeding, infection, perforation, damage to internal organs and possible oversedation/side effects from anesthesia. The patient agrees and gives consent to proceed.  Please refer to procedure notes for findings, recommendations and patient disposition/instructions.     Regis Bill, MD [facility] Gastroenterology

## 2022-11-16 NOTE — Anesthesia Postprocedure Evaluation (Signed)
Anesthesia Post Note  Patient: Alexandria Marquez  Procedure(s) Performed: COLONOSCOPY WITH PROPOFOL POLYPECTOMY  Patient location during evaluation: Endoscopy Anesthesia Type: General Level of consciousness: awake and alert Pain management: pain level controlled Vital Signs Assessment: post-procedure vital signs reviewed and stable Respiratory status: spontaneous breathing, nonlabored ventilation, respiratory function stable and patient connected to nasal cannula oxygen Cardiovascular status: blood pressure returned to baseline and stable Postop Assessment: no apparent nausea or vomiting Anesthetic complications: no   No notable events documented.   Last Vitals:  Vitals:   11/16/22 0950 11/16/22 0957  BP:  (!) 172/68  Pulse:  (!) 56  Resp:  20  Temp:    SpO2: 99% 99%    Last Pain:  Vitals:   11/16/22 0957  TempSrc:   PainSc: 0-No pain                 Cleda Mccreedy Keshana Klemz

## 2022-11-16 NOTE — Interval H&P Note (Signed)
History and Physical Interval Note:  11/16/2022 8:51 AM  Alexandria Marquez  has presented today for surgery, with the diagnosis of hx of colitis,irregular bowel habit.  The various methods of treatment have been discussed with the patient and family. After consideration of risks, benefits and other options for treatment, the patient has consented to  Procedure(s) with comments: COLONOSCOPY WITH PROPOFOL (N/A) - DM as a surgical intervention.  The patient's history has been reviewed, patient examined, no change in status, stable for surgery.  I have reviewed the patient's chart and labs.  Questions were answered to the patient's satisfaction.     Regis Bill  Ok to proceed with colonoscopy

## 2022-11-17 ENCOUNTER — Encounter: Payer: Self-pay | Admitting: Gastroenterology

## 2022-12-17 ENCOUNTER — Other Ambulatory Visit: Payer: Self-pay | Admitting: Cardiovascular Disease

## 2022-12-17 ENCOUNTER — Telehealth: Payer: Self-pay | Admitting: Cardiovascular Disease

## 2022-12-17 MED ORDER — ATORVASTATIN CALCIUM 80 MG PO TABS: ORAL_TABLET | ORAL | 0 refills | Status: DC

## 2022-12-17 NOTE — Telephone Encounter (Signed)
*  STAT* If patient is at the pharmacy, call can be transferred to refill team.   1. Which medications need to be refilled? (please list name of each medication and dose if known) atorvastatin (LIPITOR) 80 MG tablet     4. Which pharmacy/location (including street and city if local pharmacy) is medication to be sent to? [id number] - [place], [place] - [address]     5. Do they need a 30 day or 90 day supply? 90

## 2022-12-18 ENCOUNTER — Ambulatory Visit: Payer: Medicare Other | Admitting: Nurse Practitioner

## 2023-01-12 ENCOUNTER — Other Ambulatory Visit: Payer: Self-pay | Admitting: Nurse Practitioner

## 2023-01-12 ENCOUNTER — Other Ambulatory Visit: Payer: Self-pay | Admitting: Cardiovascular Disease

## 2023-01-12 NOTE — Telephone Encounter (Signed)
This is a [place] patient.

## 2023-01-28 ENCOUNTER — Encounter: Payer: Self-pay | Admitting: Nurse Practitioner

## 2023-01-28 ENCOUNTER — Ambulatory Visit: Payer: Medicare Other | Attending: Nurse Practitioner | Admitting: Nurse Practitioner

## 2023-01-28 VITALS — BP 110/68 | HR 63 | Ht 60.0 in | Wt 162.8 lb

## 2023-01-28 DIAGNOSIS — I25118 Atherosclerotic heart disease of native coronary artery with other forms of angina pectoris: Secondary | ICD-10-CM

## 2023-01-28 DIAGNOSIS — E039 Hypothyroidism, unspecified: Secondary | ICD-10-CM

## 2023-01-28 DIAGNOSIS — E119 Type 2 diabetes mellitus without complications: Secondary | ICD-10-CM | POA: Diagnosis present

## 2023-01-28 DIAGNOSIS — I10 Essential (primary) hypertension: Secondary | ICD-10-CM | POA: Diagnosis present

## 2023-01-28 DIAGNOSIS — E785 Hyperlipidemia, unspecified: Secondary | ICD-10-CM

## 2023-01-28 DIAGNOSIS — I739 Peripheral vascular disease, unspecified: Secondary | ICD-10-CM | POA: Diagnosis present

## 2023-01-28 DIAGNOSIS — K551 Chronic vascular disorders of intestine: Secondary | ICD-10-CM | POA: Diagnosis present

## 2023-01-28 DIAGNOSIS — I5032 Chronic diastolic (congestive) heart failure: Secondary | ICD-10-CM | POA: Diagnosis present

## 2023-01-28 NOTE — Patient Instructions (Signed)
Medication Instructions:  Your physician recommends that you continue on your current medications as directed. Please refer to the Current Medication list given to you today.  *If you need a refill on your cardiac medications before your next appointment, please call your pharmacy*  Lab Work: -None ordered  Testing/Procedures: -None ordered  Follow-Up: At [facility], you and your health needs are our priority.  As part of our continuing mission to provide you with exceptional heart care, we have created designated Provider Care Teams.  These Care Teams include your primary Cardiologist (physician) and Advanced Practice Providers (APPs -  Physician Assistants and Nurse Practitioners) who all work together to provide you with the care you need, when you need it.  Your next appointment:   6 month(s)  Provider:   Lorine Bears, MD    Other Instructions -None

## 2023-01-28 NOTE — Progress Notes (Signed)
Office Visit    Patient Name: Alexandria Marquez Date of Encounter: 01/28/2023  Primary Care Provider:  Patrice Paradise, MD Primary Cardiologist:  Lorine Bears, MD  Chief Complaint    77 y.o. female w/ a h/o CAD s/p CABG and subsequent RCA stenting, diast dysfxn, mild pulmonary HTN, DM, PAD s/p SMA stenting (05/2018), celiac artery stenting, HTN, HL, hypothyroidism, OSA, depression, anxiety, and GERD, who presents today for CAD f/u.   Past Medical History  Subjective   Past Medical History:  Diagnosis Date   Anxiety    Arthritis    Asthma    when young   Coronary artery disease    a. 2008 s/p CABG x 2 (LIMA->LAD, VG->Diag); b. 09/2019 PCI: LM min irregs, LAD 100p/m, 85/48m, LCX 81m/d, RCA 23m (2.0x18 Resolute Onyx DES), VG->D2 ok, LIMA->LAD 50p/d. EF 55-65%; c. 09/2020 MV: EF 83%, no isch/infarct.   Depression    Diabetes mellitus without complication (HCC)    Diastolic dysfunction    a. 08/2019 Echo: EF 60-65%, no rwma, mild LVH, Gr1 DD. RVSP 43.22mmHg. Mild MR, mild to mod TR. AoV sclerosis.   Environmental and seasonal allergies    GERD (gastroesophageal reflux disease)    History of hiatal hernia    Hyperlipidemia    Hypertension    Hypothyroid    Hypothyroidism    Lower extremity edema    Mesenteric ischemia (HCC)    a. 05/2022 s/p Stent to the Celiac artery with 7 mm diameter x 16 mm length balloon.   Motion sickness    OSA (obstructive sleep apnea) 06/04/2014   Osteoarthritis    Scoliosis    Shortness of breath dyspnea    doe   Vertigo    Past Surgical History:  Procedure Laterality Date   ABDOMINAL HYSTERECTOMY     APPENDECTOMY     BACK SURGERY  2014   lumbar fusion/plate/rods/screws   CARDIAC CATHETERIZATION     CATARACT EXTRACTION W/PHACO Left 08/28/2020   Procedure: CATARACT EXTRACTION PHACO AND INTRAOCULAR LENS PLACEMENT (IOC) LEFT DIABETIC;  Surgeon: Lockie Mola, MD;  Location: [facility];  Service: Ophthalmology;  Laterality: Left;   6.59 0:53.9 12.2%   CATARACT EXTRACTION W/PHACO Right 09/11/2020   Procedure: CATARACT EXTRACTION PHACO AND INTRAOCULAR LENS PLACEMENT (IOC) RIGHT DIABETIC;  Surgeon: Lockie Mola, MD;  Location: [facility];  Service: Ophthalmology;  Laterality: Right;  4.88 1:01.8   CHOLECYSTECTOMY  2005   COLONOSCOPY WITH PROPOFOL N/A 01/18/2017   Procedure: COLONOSCOPY WITH PROPOFOL;  Surgeon: Christena Deem, MD;  Location: [facility];  Service: Endoscopy;  Laterality: N/A;   COLONOSCOPY WITH PROPOFOL N/A 11/16/2022   Procedure: COLONOSCOPY WITH PROPOFOL;  Surgeon: Regis Bill, MD;  Location: [facility] ENDOSCOPY;  Service: Endoscopy;  Laterality: N/A;  DM   CORONARY ARTERY BYPASS GRAFT     2008 2 vessels   CORONARY ARTERY BYPASS GRAFT  2008   CORONARY STENT INTERVENTION  10/18/2019   CORONARY STENT INTERVENTION N/A 10/18/2019   Procedure: CORONARY STENT INTERVENTION;  Surgeon: Iran Ouch, MD;  Location: [facility];  Service: Cardiovascular;  Laterality: N/A;   ESOPHAGOGASTRODUODENOSCOPY (EGD) WITH PROPOFOL N/A 05/16/2018   Procedure: ESOPHAGOGASTRODUODENOSCOPY (EGD) WITH PROPOFOL;  Surgeon: Christena Deem, MD;  Location: [facility] ENDOS[facility];  Service: Endoscopy;  Laterality: N/A;   ESOPHAGOGASTRODUODENOSCOPY (EGD) WITH PROPOFOL N/A 11/05/2020   Procedure: ESOPHAGOGASTRODUODENOSCOPY (EGD) WITH PROPOFOL;  Surgeon: Regis Bill, MD;  Location: [facility] ENDOSCOPY;  Service: Endoscopy;  Laterality: N/A;  DM   FRACTURE SURGERY     HERNIA REPAIR  1987   incisional   JOINT REPLACEMENT Right 2017   knee   ORIF ANKLE FRACTURE Left 06/19/2016   Procedure: OPEN REDUCTION INTERNAL FIXATION (ORIF) ANKLE FRACTURE;  Surgeon: Juanell Fairly, MD;  Location: [facility] OR[facility];  Service: Orthopedics;  Laterality: Left;   POLYPECTOMY  11/16/2022   Procedure: POLYPECTOMY;  Surgeon: Regis Bill, MD;  Location: [facility] ENDOSCOPY;  Service: Endoscopy;;   rcr     RIGHT/LEFT HEART CATH AND  CORONARY/GRAFT ANGIOGRAPHY N/A 10/18/2019   Procedure: RIGHT/LEFT HEART CATH AND CORONARY/GRAFT ANGIOGRAPHY;  Surgeon: Iran Ouch, MD;  Location: [facility];  Service: Cardiovascular;  Laterality: N/A;   ROTATOR CUFF REPAIR Left 2013   TONSILLECTOMY     TOTAL KNEE ARTHROPLASTY Right 12/19/2015   Procedure: TOTAL KNEE ARTHROPLASTY;  Surgeon: Christena Flake, MD;  Location: [facility];  Service: Orthopedics;  Laterality: Right;   TUBAL LIGATION     VISCERAL ANGIOGRAPHY N/A 05/23/2018   Procedure: VISCERAL ANGIOGRAPHY;  Surgeon: Annice Needy, MD;  Location: [facility];  Service: Cardiovascular;  Laterality: N/A;   VISCERAL ANGIOGRAPHY N/A 05/25/2022   Procedure: VISCERAL ANGIOGRAPHY;  Surgeon: Annice Needy, MD;  Location: [facility];  Service: Cardiovascular;  Laterality: N/A;    Allergies  Allergies  Allergen Reactions   Simethicone Anaphylaxis and Hives   Morphine And Codeine Nausea And Vomiting    Severe n & v. Any other pain medications are okay   Penicillins Hives    Has patient had a PCN reaction causing immediate rash, facial/tongue/throat swelling, SOB or lightheadedness with hypotension: No Has patient had a PCN reaction causing severe rash involving mucus membranes or skin necrosis: No Has patient had a PCN reaction that required hospitalization No Has patient had a PCN reaction occurring within the last 10 years: No If all of the above answers are "NO", then may proceed with Cephalosporin use.       History of Present Illness      77 y.o. y/o female with above past medical history including CAD, diastolic dysfunction, mild pulmonary hypertension, diabetes, peripheral arterial disease status post SMA stenting in March 2020, hypertension, hyperlipidemia, hypothyroidism, sleep apnea, depression, anxiety, and GERD. She underwent CABG x 2 in 2008 at [facility]. In the setting of mesenteric ischemia, she underwent stenting of the superior mesenteric artery in March  2020, with improvement in abdominal symptoms. In May 2021, she noted progressive exertional dyspnea and chest pain. She underwent stress testing which was nonischemic. Echo showed normal LV function and mild pulmonary hypertension. She continued to have chest pain and dyspnea and subsequently underwent diagnostic catheterization July 2021, revealing to 85% RCA stenosis, which was successfully treated with a drug-eluting stent. In the setting of bradycardia at follow-up visit in January 2022, beta-blocker therapy was initially held but she was noted to have mild elevation of TSH in the setting of levothyroxine noncompliance. With resumption of levothyroxine, fatigue symptoms improved she was able to maintain a beta-blocker therapy. July 2022, Ms. Newcomer was noted to have more pronounced lateral T wave inversion. Stress testing showed normal LV function without significant ischemia. Subsequent EGD showed a single submucosal gastric papule with normal esophagus and duodenum.  In 05/2022, she required celiac artery stenting, which resulted in improvement of GI symptoms.   Ms. Mitchum was last seen in cardiology clinic in March 2024, at which time she was dealing with intermittent hypoglycemia and lightheadedness  in the absence of orthostasis.  In the setting of irregular bowel habits, she underwent colonoscopy in August 2024, which showed 3 small polyps, a single diverticulum, and internal hemorrhoids.  Today, Ms. Keel reports that she has been feeling well from a cardiac standpoint.  She has chronic back pain which sometimes radiates around into her chest.  Typically that only lasts a few minutes and resolve spontaneously.  She does not identify this as angina.  She has chronic, stable dyspnea on exertion in the setting of limited activity.  She denies palpitations, PND, orthopnea, dizziness, syncope, edema, or early satiety. Objective  Home Medications    Current Outpatient Medications  Medication Sig Dispense  Refill   acetaminophen (TYLENOL) 500 MG tablet Take 1,000 mg by mouth every 6 (six) hours as needed for moderate pain or mild pain.     amLODipine (NORVASC) 5 MG tablet Take 1 tablet (5 mg total) by mouth 2 (two) times daily.     aspirin 81 MG tablet Take 1 tablet (81 mg total) by mouth daily. 30 tablet 0   atorvastatin (LIPITOR) 80 MG tablet TAKE 1 TABLET BY MOUTH EVERYDAY AT BEDTIME 90 tablet 0   clopidogrel (PLAVIX) 75 MG tablet TAKE 1 TABLET BY MOUTH EVERY DAY WITH BREAKFAST 90 tablet 0   enalapril (VASOTEC) 10 MG tablet TAKE 1 TABLET BY MOUTH TWICE A DAY 180 tablet 1   ezetimibe (ZETIA) 10 MG tablet TAKE 1 TABLET BY MOUTH EVERY DAY 90 tablet 3   fluticasone (FLONASE) 50 MCG/ACT nasal spray Place 1-2 sprays into both nostrils daily as needed for allergies.      gabapentin (NEURONTIN) 300 MG capsule Take 600 mg by mouth at bedtime.     glimepiride (AMARYL) 2 MG tablet Take 2 mg by mouth daily with breakfast.     glucose blood (ONETOUCH ULTRA) test strip USE 2 (TWO) TIMES DAILY. USE AS INSTRUCTED.     insulin detemir (LEVEMIR) 100 UNIT/ML injection Inject 27 Units into the skin daily.      levothyroxine (SYNTHROID) 88 MCG tablet Take by mouth.     metFORMIN (GLUCOPHAGE) 1000 MG tablet Take 1 tablet (1,000 mg total) by mouth 2 (two) times daily with a meal. Resume on 10/21/19.     nitroGLYCERIN (NITROSTAT) 0.4 MG SL tablet Place 1 tablet (0.4 mg total) under the tongue every 5 (five) minutes as needed for chest pain. 25 tablet 3   ondansetron (ZOFRAN-ODT) 4 MG disintegrating tablet Take 4 mg by mouth every 8 (eight) hours as needed for nausea or vomiting.     pantoprazole (PROTONIX) 40 MG tablet Take 40 mg by mouth daily.      Polyethyl Glycol-Propyl Glycol 0.4-0.3 % SOLN Place 1 drop into both eyes 3 (three) times daily as needed (for dry eyes).     sertraline (ZOLOFT) 100 MG tablet Take 100 mg by mouth daily.     tiZANidine (ZANAFLEX) 2 MG tablet Take 2 mg by mouth every 6 (six) hours as needed  for muscle spasms.     VITAMIN D PO Take 1 tablet by mouth daily.     No current facility-administered medications for this visit.     Physical Exam    VS:  BP 110/68 (BP Location: Left Arm, Patient Position: Sitting, Cuff Size: Normal)   Pulse 63   Ht 5' (1.524 m)   Wt 162 lb 12.8 oz (73.8 kg)   SpO2 98%   BMI 31.79 kg/m  , BMI Body mass index is  31.79 kg/m.       GEN: Well nourished, well developed, in no acute distress. HEENT: normal. Neck: Supple, no JVD, carotid bruits, or masses. Cardiac: RRR, 2/6 systolic murmur at the upper sternal borders.  No rubs or gallops.  No clubbing, cyanosis, edema.  Radials 2+/PT 2+ and equal bilaterally.  Respiratory:  Respirations regular and unlabored, clear to auscultation bilaterally. GI: Soft, nontender, nondistended, BS + x 4. MS: no deformity or atrophy. Skin: warm and dry, no rash. Neuro:  Strength and sensation are intact. Psych: Normal affect.  Accessory Clinical Findings    ECG personally reviewed by me today - EKG Interpretation Date/Time:  Thursday January 28 2023 15:45:46 EST Ventricular Rate:  63 PR Interval:  148 QRS Duration:  98 QT Interval:  400 QTC Calculation: 409 R Axis:   -13  Text Interpretation: Normal sinus rhythm Incomplete right bundle branch block Nonspecific ST abnormality Confirmed by Nicolasa Ducking [phone]) on 01/28/2023 4:11:04 PM  - no acute changes.  Lab Results  Component Value Date   WBC 12.5 (H) 08/18/2020   HGB 11.9 (L) 08/18/2020   HCT 35.9 (L) 08/18/2020   MCV 90.7 08/18/2020   PLT 306 08/18/2020   Lab Results  Component Value Date   CREATININE 0.97 05/25/2022   BUN 21 05/25/2022   NA 142 10/17/2020   K 4.5 10/17/2020   CL 105 10/17/2020   CO2 28 10/17/2020   Lab Results  Component Value Date   ALT 14 10/17/2020   AST 18 10/17/2020   ALKPHOS 56 10/17/2020   BILITOT 0.5 10/17/2020   Lab Results  Component Value Date   CHOL 131 10/19/2019   HDL 37 (L) 10/19/2019    LDLCALC 70 10/19/2019   LDLDIRECT 75.0 12/11/2019   TRIG 120 10/19/2019   CHOLHDL 3.5 10/19/2019    Lab Results  Component Value Date   HGBA1C 7.2 (H) 06/02/2016   Lab Results  Component Value Date   TSH 5.157 (H) 03/29/2020       Assessment & Plan    1.  Coronary artery disease: Status post CABG x 2 in 2008 with drug-eluting stent placement to the right coronary artery in July 2021.  She has known, residual 60% mid to distal circumflex disease with low risk stress test in July 2022.  She has not been having any significant angina.  She is stable dyspnea on exertion.  Continue aspirin, statin, Plavix, calcium channel blocker, ezetimibe, and ACE inhibitor therapy.  2.  Primary hypertension: Blood pressure stable today.  Continue calcium channel blocker and ACE inhibitor therapy.  3.  Hyperlipidemia: LDL of 53 in January of last year.  She remains on statin and ezetimibe therapy.  She says she has follow-up labs planned in early January.  4.  Type 2 diabetes mellitus: Fewer issues with hypoglycemia following adjustments to her evening insulin.  A1c 6.7 in July.  Followed by endocrinology.  5.  Chronic HFpEF: Chronic, stable dyspnea on exertion.  She is euvolemic on examination.  Heart rate and blood pressure stable.  Continue current medications.  Not currently requiring oral diuretic.  6.  Hypothyroidism: TSH was normal in March.  Reports compliance with levothyroxine.  7.  Mesenteric ischemia/PAD: Status post prior SMA and more recent celiac artery stenting.  No significant GI symptoms.  Followed by vascular surgery.  Remains on aspirin and Plavix.  8.  Disposition: Follow-up in clinic in 6 months or sooner if necessary.  Nicolasa Ducking, NP 01/28/2023, 5:15 PM

## 2023-03-10 ENCOUNTER — Telehealth: Payer: Self-pay | Admitting: Cardiovascular Disease

## 2023-03-10 MED ORDER — EZETIMIBE 10 MG PO TABS: 10.0000 mg | ORAL_TABLET | Freq: Every day | ORAL | 2 refills | Status: DC

## 2023-03-10 NOTE — Telephone Encounter (Signed)
*  STAT* If patient is at the pharmacy, call can be transferred to refill team.   1. Which medications need to be refilled? (please list name of each medication and dose if known)   ezetimibe (ZETIA) 10 MG tablet     2. Would you like to learn more about the convenience, safety, & potential cost savings by using the [facility]? No   3. Are you open to using the [facility] No   4. Which pharmacy/location (including street and city if local pharmacy) is medication to be sent to?[id number] - [place], [place] - [address]    5. Do they need a 30 day or 90 day supply? 90 Day Supply

## 2023-03-10 NOTE — Telephone Encounter (Signed)
Requested Prescriptions   Signed Prescriptions Disp Refills   ezetimibe (ZETIA) 10 MG tablet 90 tablet 2    Sig: Take 1 tablet (10 mg total) by mouth daily.    Authorizing Provider: Lorine Bears A    Ordering User: Guerry Minors

## 2023-03-26 DIAGNOSIS — E1142 Type 2 diabetes mellitus with diabetic polyneuropathy: Secondary | ICD-10-CM | POA: Diagnosis not present

## 2023-03-26 DIAGNOSIS — M810 Age-related osteoporosis without current pathological fracture: Secondary | ICD-10-CM | POA: Diagnosis not present

## 2023-03-26 DIAGNOSIS — E039 Hypothyroidism, unspecified: Secondary | ICD-10-CM | POA: Diagnosis not present

## 2023-03-26 DIAGNOSIS — E782 Mixed hyperlipidemia: Secondary | ICD-10-CM | POA: Diagnosis not present

## 2023-03-31 ENCOUNTER — Other Ambulatory Visit: Payer: Self-pay | Admitting: Physician Assistant

## 2023-03-31 DIAGNOSIS — I25118 Atherosclerotic heart disease of native coronary artery with other forms of angina pectoris: Secondary | ICD-10-CM | POA: Diagnosis not present

## 2023-03-31 DIAGNOSIS — Z1231 Encounter for screening mammogram for malignant neoplasm of breast: Secondary | ICD-10-CM

## 2023-03-31 DIAGNOSIS — I5032 Chronic diastolic (congestive) heart failure: Secondary | ICD-10-CM | POA: Diagnosis not present

## 2023-03-31 DIAGNOSIS — N1831 Chronic kidney disease, stage 3a: Secondary | ICD-10-CM | POA: Diagnosis not present

## 2023-03-31 DIAGNOSIS — F325 Major depressive disorder, single episode, in full remission: Secondary | ICD-10-CM | POA: Diagnosis not present

## 2023-03-31 DIAGNOSIS — E1142 Type 2 diabetes mellitus with diabetic polyneuropathy: Secondary | ICD-10-CM | POA: Diagnosis not present

## 2023-03-31 DIAGNOSIS — E039 Hypothyroidism, unspecified: Secondary | ICD-10-CM | POA: Diagnosis not present

## 2023-03-31 DIAGNOSIS — I10 Essential (primary) hypertension: Secondary | ICD-10-CM | POA: Diagnosis not present

## 2023-03-31 DIAGNOSIS — Z0000 Encounter for general adult medical examination without abnormal findings: Secondary | ICD-10-CM | POA: Diagnosis not present

## 2023-03-31 DIAGNOSIS — G4733 Obstructive sleep apnea (adult) (pediatric): Secondary | ICD-10-CM | POA: Diagnosis not present

## 2023-03-31 DIAGNOSIS — F321 Major depressive disorder, single episode, moderate: Secondary | ICD-10-CM | POA: Diagnosis not present

## 2023-03-31 DIAGNOSIS — E782 Mixed hyperlipidemia: Secondary | ICD-10-CM | POA: Diagnosis not present

## 2023-04-22 DIAGNOSIS — N1832 Chronic kidney disease, stage 3b: Secondary | ICD-10-CM | POA: Diagnosis not present

## 2023-04-22 DIAGNOSIS — M810 Age-related osteoporosis without current pathological fracture: Secondary | ICD-10-CM | POA: Diagnosis not present

## 2023-04-22 DIAGNOSIS — E1142 Type 2 diabetes mellitus with diabetic polyneuropathy: Secondary | ICD-10-CM | POA: Diagnosis not present

## 2023-04-22 DIAGNOSIS — E039 Hypothyroidism, unspecified: Secondary | ICD-10-CM | POA: Diagnosis not present

## 2023-04-22 DIAGNOSIS — Z794 Long term (current) use of insulin: Secondary | ICD-10-CM | POA: Diagnosis not present

## 2023-04-22 DIAGNOSIS — E1122 Type 2 diabetes mellitus with diabetic chronic kidney disease: Secondary | ICD-10-CM | POA: Diagnosis not present

## 2023-04-26 DIAGNOSIS — H903 Sensorineural hearing loss, bilateral: Secondary | ICD-10-CM | POA: Diagnosis not present

## 2023-05-19 DIAGNOSIS — M810 Age-related osteoporosis without current pathological fracture: Secondary | ICD-10-CM | POA: Diagnosis not present

## 2023-07-14 ENCOUNTER — Other Ambulatory Visit: Payer: Self-pay | Admitting: Cardiovascular Disease

## 2023-07-16 ENCOUNTER — Other Ambulatory Visit: Payer: Self-pay | Admitting: Cardiovascular Disease

## 2023-07-27 DIAGNOSIS — R198 Other specified symptoms and signs involving the digestive system and abdomen: Secondary | ICD-10-CM | POA: Diagnosis not present

## 2023-07-27 DIAGNOSIS — K219 Gastro-esophageal reflux disease without esophagitis: Secondary | ICD-10-CM | POA: Diagnosis not present

## 2023-07-27 DIAGNOSIS — R110 Nausea: Secondary | ICD-10-CM | POA: Diagnosis not present

## 2023-07-28 ENCOUNTER — Encounter: Payer: Self-pay | Admitting: Nurse Practitioner

## 2023-07-28 ENCOUNTER — Ambulatory Visit: Payer: Self-pay | Attending: Nurse Practitioner | Admitting: Nurse Practitioner

## 2023-07-28 VITALS — BP 122/66 | HR 63 | Ht 60.0 in | Wt 155.6 lb

## 2023-07-28 DIAGNOSIS — E785 Hyperlipidemia, unspecified: Secondary | ICD-10-CM | POA: Diagnosis not present

## 2023-07-28 DIAGNOSIS — I25110 Atherosclerotic heart disease of native coronary artery with unstable angina pectoris: Secondary | ICD-10-CM

## 2023-07-28 DIAGNOSIS — I5032 Chronic diastolic (congestive) heart failure: Secondary | ICD-10-CM

## 2023-07-28 DIAGNOSIS — I10 Essential (primary) hypertension: Secondary | ICD-10-CM

## 2023-07-28 DIAGNOSIS — E119 Type 2 diabetes mellitus without complications: Secondary | ICD-10-CM

## 2023-07-28 DIAGNOSIS — K551 Chronic vascular disorders of intestine: Secondary | ICD-10-CM

## 2023-07-28 DIAGNOSIS — E039 Hypothyroidism, unspecified: Secondary | ICD-10-CM | POA: Diagnosis not present

## 2023-07-28 NOTE — Patient Instructions (Addendum)
 Medication Instructions:  No changes *If you need a refill on your cardiac medications before your next appointment, please call your pharmacy*  Lab Work: None ordered If you have labs (blood work) drawn today and your tests are completely normal, you will receive your results only by: MyChart Message (if you have MyChart) OR A paper copy in the mail If you have any lab test that is abnormal or we need to change your treatment, we will call you to review the results.  Testing/Procedures: CARDIAC PET- Your physician has requested that you have a Cardiac Pet Stress Test.   This testing is completed at [facility] ([address] [place], [place] [address]) or [facility] ([address], [place], [place]). Please arrive 30 minutes prior to your scheduled time.  The schedulers will call you to get this scheduled. Please follow further testing instructions below.   Follow-Up: At [facility], you and your health needs are our priority.  As part of our continuing mission to provide you with exceptional heart care, our providers are all part of one team.  This team includes your primary Cardiologist (physician) and Advanced Practice Providers or APPs (Physician Assistants and Nurse Practitioners) who all work together to provide you with the care you need, when you need it.  Your next appointment:   1 month(s)  Provider:   You may see Antionette Kirks, MD or one of the following Advanced Practice Providers on your designated Care Team:   Laneta Pintos, NP  We recommend signing up for the patient portal called "MyChart".  Sign up information is provided on this After Visit Summary.  MyChart is used to connect with patients for Virtual Visits (Telemedicine).  Patients are able to view lab/test results, encounter notes, upcoming appointments, etc.  Non-urgent messages can be sent to your provider as well.   To learn more about what you  can do with MyChart, go to [email or web address].   Other Instructions    Please report to Radiology at the [facility] Main Entrance 30 minutes early for your test.  [address] [place], [place] [address]                         OR   Please report to Radiology at [facility] Main Entrance, medical mall, 30 mins prior to your test.  [address]  [place], [place]  How to Prepare for Your Cardiac PET/CT Stress Test:  Nothing to eat or drink, except water, 3 hours prior to arrival time.  NO caffeine/decaffeinated products, or chocolate 12 hours prior to arrival. (Please note decaffeinated beverages (teas/coffees) still contain caffeine).  If you have caffeine within 12 hours prior, the test will need to be rescheduled.  Medication instructions: Do not take nitrates (isosorbide mononitrate, Ranexa) the day before or day of test  Diabetic Preparation: If able to eat breakfast prior to 3 hour fasting, you may take all medications, including your insulin . Do not worry if you miss your breakfast dose of insulin  - start at your next meal. If you do not eat prior to 3 hour fast-Hold all diabetes (oral and insulin ) medications. Patients who wear a continuous glucose monitor MUST remove the device prior to scanning.  You may take your remaining medications with water.  NO perfume, cologne or lotion on chest or abdomen area. FEMALES - Please avoid wearing dresses to this appointment.  Total time  is 1 to 2 hours; you may want to bring reading material for the waiting time.  IF YOU THINK YOU MAY BE PREGNANT, OR ARE NURSING PLEASE INFORM THE TECHNOLOGIST.  In preparation for your appointment, medication and supplies will be purchased.  Appointment availability is limited, so if you need to cancel or reschedule, please call the Radiology Department Scheduler at [phone] 24 hours in advance to avoid a cancellation fee of $100.00  What to Expect  When you Arrive:  Once you arrive and check in for your appointment, you will be taken to a preparation room within the Radiology Department.  A technologist or Nurse will obtain your medical history, verify that you are correctly prepped for the exam, and explain the procedure.  Afterwards, an IV will be started in your arm and electrodes will be placed on your skin for EKG monitoring during the stress portion of the exam. Then you will be escorted to the PET/CT scanner.  There, staff will get you positioned on the scanner and obtain a blood pressure and EKG.  During the exam, you will continue to be connected to the EKG and blood pressure machines.  A small, safe amount of a radioactive tracer will be injected in your IV to obtain a series of pictures of your heart along with an injection of a stress agent.    After your Exam:  It is recommended that you eat a meal and drink a caffeinated beverage to counter act any effects of the stress agent.  Drink plenty of fluids for the remainder of the day and urinate frequently for the first couple of hours after the exam.  Your doctor will inform you of your test results within 7-10 business days.  For more information and frequently asked questions, please visit our website: [email or web address]  For questions about your test or how to prepare for your test, please call: Cardiac Imaging Nurse Navigators Office: [phone]

## 2023-07-28 NOTE — Progress Notes (Signed)
 Office Visit    Patient Name: Alexandria Marquez Date of Encounter: 07/28/2023  Primary Care Provider:  Delmus Ferri, MD Primary Cardiologist:  Antionette Kirks, MD  Chief Complaint    78 y.o. female w/ a h/o CAD s/p CABG and subsequent RCA stenting, diast dysfxn, mild pulmonary HTN, DM, PAD s/p SMA stenting (05/2018), celiac artery stenting, HTN, HL, hypothyroidism, OSA, depression, anxiety, and GERD, who presents today for angina f/u.  Past Medical History  Subjective   Past Medical History:  Diagnosis Date   Anxiety    Arthritis    Asthma    when young   Coronary artery disease    a. 2008 s/p CABG x 2 (LIMA->LAD, VG->Diag); b. 09/2019 PCI: LM min irregs, LAD 100p/m, 85/18m, LCX 26m/d, RCA 69m (2.0x18 Resolute Onyx DES), VG->D2 ok, LIMA->LAD 50p/d. EF 55-65%; c. 09/2020 MV: EF 83%, no isch/infarct.   Depression    Diabetes mellitus without complication (HCC)    Diastolic dysfunction    a. 08/2019 Echo: EF 60-65%, no rwma, mild LVH, Gr1 DD. RVSP 43.84mmHg. Mild MR, mild to mod TR. AoV sclerosis.   Environmental and seasonal allergies    GERD (gastroesophageal reflux disease)    History of hiatal hernia    Hyperlipidemia    Hypertension    Hypothyroid    Hypothyroidism    Lower extremity edema    Mesenteric ischemia (HCC)    a. 05/2022 s/p Stent to the Celiac artery with 7 mm diameter x 16 mm length balloon.   Motion sickness    OSA (obstructive sleep apnea) 06/04/2014   Osteoarthritis    Scoliosis    Shortness of breath dyspnea    doe   Vertigo    Past Surgical History:  Procedure Laterality Date   ABDOMINAL HYSTERECTOMY     APPENDECTOMY     BACK SURGERY  2014   lumbar fusion/plate/rods/screws   CARDIAC CATHETERIZATION     CATARACT EXTRACTION W/PHACO Left 08/28/2020   Procedure: CATARACT EXTRACTION PHACO AND INTRAOCULAR LENS PLACEMENT (IOC) LEFT DIABETIC;  Surgeon: Annell Kidney, MD;  Location: [facility];  Service: Ophthalmology;  Laterality: Left;   6.59 0:53.9 12.2%   CATARACT EXTRACTION W/PHACO Right 09/11/2020   Procedure: CATARACT EXTRACTION PHACO AND INTRAOCULAR LENS PLACEMENT (IOC) RIGHT DIABETIC;  Surgeon: Annell Kidney, MD;  Location: [facility];  Service: Ophthalmology;  Laterality: Right;  4.88 1:01.8   CHOLECYSTECTOMY  2005   COLONOSCOPY WITH PROPOFOL  N/A 01/18/2017   Procedure: COLONOSCOPY WITH PROPOFOL ;  Surgeon: Deveron Fly, MD;  Location: [facility];  Service: Endoscopy;  Laterality: N/A;   COLONOSCOPY WITH PROPOFOL  N/A 11/16/2022   Procedure: COLONOSCOPY WITH PROPOFOL ;  Surgeon: Shane Darling, MD;  Location: [facility] ENDOSCOPY;  Service: Endoscopy;  Laterality: N/A;  DM   CORONARY ARTERY BYPASS GRAFT     2008 2 vessels   CORONARY ARTERY BYPASS GRAFT  2008   CORONARY STENT INTERVENTION  10/18/2019   CORONARY STENT INTERVENTION N/A 10/18/2019   Procedure: CORONARY STENT INTERVENTION;  Surgeon: Wenona Hamilton, MD;  Location: [facility];  Service: Cardiovascular;  Laterality: N/A;   ESOPHAGOGASTRODUODENOSCOPY (EGD) WITH PROPOFOL  N/A 05/16/2018   Procedure: ESOPHAGOGASTRODUODENOSCOPY (EGD) WITH PROPOFOL ;  Surgeon: Deveron Fly, MD;  Location: [facility] END[facility];  Service: Endoscopy;  Laterality: N/A;   ESOPHAGOGASTRODUODENOSCOPY (EGD) WITH PROPOFOL  N/A 11/05/2020   Procedure: ESOPHAGOGASTRODUODENOSCOPY (EGD) WITH PROPOFOL ;  Surgeon: Shane Darling, MD;  Location: [facility] END[facility];  Service: Endoscopy;  Laterality: N/A;  DM  FRACTURE SURGERY     HERNIA REPAIR  1987   incisional   JOINT REPLACEMENT Right 2017   knee   ORIF ANKLE FRACTURE Left 06/19/2016   Procedure: OPEN REDUCTION INTERNAL FIXATION (ORIF) ANKLE FRACTURE;  Surgeon: Rande Bushy, MD;  Location: [facility];  Service: Orthopedics;  Laterality: Left;   POLYPECTOMY  11/16/2022   Procedure: POLYPECTOMY;  Surgeon: Shane Darling, MD;  Location: [facility] END[facility];  Service: Endoscopy;;   rcr     RIGHT/LEFT HEART CATH AND  CORONARY/GRAFT ANGIOGRAPHY N/A 10/18/2019   Procedure: RIGHT/LEFT HEART CATH AND CORONARY/GRAFT ANGIOGRAPHY;  Surgeon: Wenona Hamilton, MD;  Location: [facility];  Service: Cardiovascular;  Laterality: N/A;   ROTATOR CUFF REPAIR Left 2013   TONSILLECTOMY     TOTAL KNEE ARTHROPLASTY Right 12/19/2015   Procedure: TOTAL KNEE ARTHROPLASTY;  Surgeon: Elner Hahn, MD;  Location: [facility];  Service: Orthopedics;  Laterality: Right;   TUBAL LIGATION     VISCERAL ANGIOGRAPHY N/A 05/23/2018   Procedure: VISCERAL ANGIOGRAPHY;  Surgeon: Celso College, MD;  Location: [facility];  Service: Cardiovascular;  Laterality: N/A;   VISCERAL ANGIOGRAPHY N/A 05/25/2022   Procedure: VISCERAL ANGIOGRAPHY;  Surgeon: Celso College, MD;  Location: [facility];  Service: Cardiovascular;  Laterality: N/A;    Allergies  Allergies  Allergen Reactions   Simethicone Anaphylaxis and Hives   Morphine  And Codeine Nausea And Vomiting    Severe n & v. Any other pain medications are okay   Penicillins Hives    Has patient had a PCN reaction causing immediate rash, facial/tongue/throat swelling, SOB or lightheadedness with hypotension: No Has patient had a PCN reaction causing severe rash involving mucus membranes or skin necrosis: No Has patient had a PCN reaction that required hospitalization No Has patient had a PCN reaction occurring within the last 10 years: No If all of the above answers are "NO", then may proceed with Cephalosporin use.       History of Present Illness      78 y.o. y/o female with above past medical history including CAD, diastolic dysfunction, mild pulmonary hypertension, diabetes, peripheral arterial disease status post SMA stenting in March 2020, hypertension, hyperlipidemia, hypothyroidism, sleep apnea, depression, anxiety, and GERD. She underwent CABG x 2 in 2008 at [facility]. In the setting of mesenteric ischemia, she underwent stenting of the superior mesenteric artery in March  2020, with improvement in abdominal symptoms. In May 2021, she noted progressive exertional dyspnea and chest pain. She underwent stress testing which was nonischemic. Echo showed normal LV function and mild pulmonary hypertension. She continued to have chest pain and dyspnea and subsequently underwent diagnostic catheterization in July 2021, revealing to 85% RCA stenosis, which was successfully treated with a drug-eluting stent. In the setting of bradycardia at follow-up visit in January 2022, beta-blocker therapy was initially held but she was noted to have mild elevation of TSH in the setting of levothyroxine  noncompliance. With resumption of levothyroxine , fatigue symptoms improved she was able to maintain a beta-blocker therapy. July 2022, Alexandria Marquez was noted to have more pronounced lateral T wave inversion. Stress testing showed normal LV function without significant ischemia. Subsequent EGD showed a single submucosal gastric papule with normal esophagus and duodenum.  In 05/2022, she required celiac artery stenting, which resulted in improvement of GI symptoms.    Alexandria Marquez was last seen in cardiology clinic in November 2024, at which time she was feeling well.  Over the past six  months, she has started noticing substernal chest tightness associated with dyspnea, specifically while reaching above her head to grab her work on something.  Symptoms typically last 2 to 5 minutes and resolve with rest.  She has chronic dyspnea on exertion when walking, which is unchanged.  She has not had chest pain with walking though notes that because of chronic back pain, her walking is fairly limited.  She has never had resting symptoms either.  Further, she denies any chest wall tenderness or arm claudication during the episodes.  She denies palpitations, PND, orthopnea, dizziness, syncope, edema, or early satiety.  As noted, she has chronic back pain and is considering seeking evaluation by orthopedics, as it has really  been limiting her life. Objective  Home Medications    Current Outpatient Medications  Medication Sig Dispense Refill   acetaminophen  (TYLENOL ) 500 MG tablet Take 1,000 mg by mouth every 6 (six) hours as needed for moderate pain or mild pain.     amLODipine  (NORVASC ) 5 MG tablet Take 1 tablet (5 mg total) by mouth 2 (two) times daily.     aspirin  81 MG tablet Take 1 tablet (81 mg total) by mouth daily. 30 tablet 0   atorvastatin  (LIPITOR) 80 MG tablet TAKE 1 TABLET BY MOUTH EVERYDAY AT BEDTIME 90 tablet 0   B-D UF III MINI PEN NEEDLES 31G X 5 MM MISC by Other route See admin instructions.     clopidogrel  (PLAVIX ) 75 MG tablet TAKE 1 TABLET BY MOUTH EVERY DAY WITH BREAKFAST 90 tablet 3   Continuous Glucose Sensor (FREESTYLE LIBRE 3 PLUS SENSOR) MISC every 14 (fourteen) days.     enalapril  (VASOTEC ) 10 MG tablet TAKE 1 TABLET BY MOUTH TWICE A DAY 180 tablet 1   ezetimibe  (ZETIA ) 10 MG tablet Take 1 tablet (10 mg total) by mouth daily. 90 tablet 2   fluticasone  (FLONASE ) 50 MCG/ACT nasal spray Place 1-2 sprays into both nostrils daily as needed for allergies.      gabapentin  (NEURONTIN ) 300 MG capsule Take 600 mg by mouth at bedtime.     glimepiride  (AMARYL ) 2 MG tablet Take 2 mg by mouth daily with breakfast.     glucose blood (ONETOUCH ULTRA) test strip USE 2 (TWO) TIMES DAILY. USE AS INSTRUCTED.     LANTUS SOLOSTAR 100 UNIT/ML Solostar Pen Inject 10 Units into the skin.     levothyroxine  (SYNTHROID ) 88 MCG tablet Take by mouth.     metFORMIN  (GLUCOPHAGE ) 1000 MG tablet Take 1 tablet (1,000 mg total) by mouth 2 (two) times daily with a meal. Resume on 10/21/19.     nitroGLYCERIN  (NITROSTAT ) 0.4 MG SL tablet Place 1 tablet (0.4 mg total) under the tongue every 5 (five) minutes as needed for chest pain. 25 tablet 3   ondansetron  (ZOFRAN -ODT) 4 MG disintegrating tablet Take 4 mg by mouth every 8 (eight) hours as needed for nausea or vomiting.     pantoprazole  (PROTONIX ) 40 MG tablet Take 40 mg by  mouth daily.      Polyethyl Glycol-Propyl Glycol 0.4-0.3 % SOLN Place 1 drop into both eyes 3 (three) times daily as needed (for dry eyes).     sertraline  (ZOLOFT ) 100 MG tablet Take 100 mg by mouth daily.     tiZANidine  (ZANAFLEX ) 2 MG tablet Take 2 mg by mouth every 6 (six) hours as needed for muscle spasms.     VITAMIN D PO Take 1 tablet by mouth daily.     No current facility-administered medications  for this visit.     Physical Exam    VS:  BP 122/66 (BP Location: Left Arm, Patient Position: Sitting, Cuff Size: Normal)   Pulse 63   Ht 5' (1.524 m)   Wt 155 lb 9.6 oz (70.6 kg)   SpO2 96%   BMI 30.39 kg/m  , BMI Body mass index is 30.39 kg/m.       GEN: Well nourished, well developed, in no acute distress. HEENT: normal. Neck: Supple, no JVD, carotid bruits, or masses. Cardiac: RRR, 2/6 systolic murmur at the upper sternal borders.  No rubs or gallops. No clubbing, cyanosis, edema.  Radials 2+/PT 2+ and equal bilaterally.  Respiratory:  Respirations regular and unlabored, clear to auscultation bilaterally. GI: Soft, nontender, nondistended, BS + x 4. MS: no deformity or atrophy. Skin: warm and dry, no rash. Neuro:  Strength and sensation are intact. Psych: Normal affect.  Accessory Clinical Findings    ECG personally reviewed by me today - EKG Interpretation Date/Time:  Wednesday Jul 28 2023 15:27:58 EDT Ventricular Rate:  63 PR Interval:  156 QRS Duration:  110 QT Interval:  394 QTC Calculation: 403 R Axis:   12  Text Interpretation: Normal sinus rhythm Right bundle branch block Confirmed by Laneta Pintos [phone]) on 07/28/2023 3:39:40 PM  - no acute changes.  Labs dated March 26, 2023 in Care Everywhere:  Hemoglobin 13.7, hematocrit 40.9, WBC 12.1, platelets 325 Sodium 139, potassium 4.6, chloride 105, CO2 24.4, BUN 18, creatinine 1.1, glucose 128 Total bilirubin 0.5, alkaline phosphatase 64, AST 14, ALT 10 Calcium  9.9, albumin 4.6, total protein 6.9 Total  cholesterol 131, triglycerides 108, HDL 48.2, LDL 61 TSH 8.557 Hemoglobin A1c 7.1    Assessment & Plan    1.  Coronary artery disease/unstable angina: Status post CABG x 2 in 2008 with drug-eluting stent placement to right coronary artery in July 2021.  She has known, residual 60% mid to distal circumflex disease with low risk stress test in July 2022.  She has chronic, stable dyspnea on exertion.  Over the past 6 months, she has been noticing substernal chest tightness occurring specifically when reaching above her head to grab or work on something.  This is associated with dyspnea, last 2 to 5 minutes, resolves with rest.  There is no chest wall tenderness or arm claudication.  We discussed that though the symptoms could be musculoskeletal, they are concerning for angina and as result, we will pursue a Lexiscan  PET stress.  It is notable that she had a low risk Myoview  in 2021 prior to her RCA intervention, thus why Lexiscan  PET stress was chosen this time.  Continue aspirin , statin, clopidogrel , Zetia , calcium  channel blocker, and ACE inhibitor. Informed Consent   Shared Decision Making/Informed Consent The risks [chest pain, shortness of breath, cardiac arrhythmias, dizziness, blood pressure fluctuations, myocardial infarction, stroke/transient ischemic attack, nausea, vomiting, allergic reaction, radiation exposure, metallic taste sensation and life-threatening complications (estimated to be 1 in 10,000)], benefits (risk stratification, diagnosing coronary artery disease, treatment guidance) and alternatives of a cardiac PET stress test were discussed in detail with Alexandria Marquez and she agrees to proceed.      2.  Primary hypertension: Blood pressure stable today.  She remains on calcium  channel blocker and ACE inhibitor therapy.  3.  Hyperlipidemia: LDL of 61 in January of this year.  LFTs normal at that time.  She remains on statin and Zetia  therapy.  4.  Type 2 diabetes mellitus: Overall stable  from her perspective.  A1c was up some earlier this year at 7.1 in January.  Managed by primary care.  5.  Chronic HFpEF: She has chronic, stable dyspnea on exertion.  She is euvolemic on examination with stable heart rate and blood pressure.  She does not currently require a diuretic.  6.  Hypothyroidism: TSH normal in January.  She is on levothyroxine .  7.  Mesenteric ischemia/PAD: Status post prior SMA and more recent celiac artery stenting.  No significant GI symptoms.  She is followed by vascular surgery and remains on aspirin  and Plavix .  8.  Disposition: Follow-up Lexiscan  PET stress.  Follow-up in clinic in 1 month or sooner if necessary.  Laneta Pintos, NP 07/28/2023, 4:48 PM

## 2023-08-10 ENCOUNTER — Encounter (INDEPENDENT_AMBULATORY_CARE_PROVIDER_SITE_OTHER): Payer: Self-pay

## 2023-08-24 ENCOUNTER — Encounter (HOSPITAL_COMMUNITY): Payer: Self-pay

## 2023-08-26 ENCOUNTER — Ambulatory Visit: Payer: Self-pay | Admitting: Nurse Practitioner

## 2023-08-26 ENCOUNTER — Ambulatory Visit
Admission: RE | Admit: 2023-08-26 | Discharge: 2023-08-26 | Disposition: A | Discharge status: Home / Self Care | Source: Ambulatory Visit | Attending: Nurse Practitioner | Admitting: Nurse Practitioner

## 2023-08-26 DIAGNOSIS — R9431 Abnormal electrocardiogram [ECG] [EKG]: Secondary | ICD-10-CM | POA: Insufficient documentation

## 2023-08-26 DIAGNOSIS — I25118 Atherosclerotic heart disease of native coronary artery with other forms of angina pectoris: Secondary | ICD-10-CM | POA: Diagnosis not present

## 2023-08-26 DIAGNOSIS — I10 Essential (primary) hypertension: Secondary | ICD-10-CM

## 2023-08-26 DIAGNOSIS — I25110 Atherosclerotic heart disease of native coronary artery with unstable angina pectoris: Secondary | ICD-10-CM

## 2023-08-26 DIAGNOSIS — R072 Precordial pain: Secondary | ICD-10-CM | POA: Diagnosis not present

## 2023-08-26 LAB — NM PET CT CARDIAC PERFUSION MULTI W/ABSOLUTE BLOODFLOW
LV dias vol: 67 mL (ref 46–106)
LV sys vol: 18 mL
MBFR: 1.71
Nuc Rest EF: 72 %
Nuc Stress EF: 72 %
Peak HR: 76 {beats}/min
Rest HR: 64 {beats}/min
Rest MBF: 0.94 ml/g/min
Rest Nuclear Isotope Dose: 18.1 mCi
SRS: 0
SSS: 2
Stress MBF: 1.61 ml/g/min
Stress Nuclear Isotope Dose: 18.2 mCi
TID: 1.12

## 2023-08-26 MED ORDER — REGADENOSON 0.4 MG/5ML IV SOLN
0.4000 mg | Freq: Once | INTRAVENOUS | Status: AC
  Administered 2023-08-26: 0.4 mg via INTRAVENOUS
  Filled 2023-08-26: qty 5

## 2023-08-26 MED ORDER — RUBIDIUM RB82 GENERATOR (RUBYFILL)
18.0800 | PACK | Freq: Once | INTRAVENOUS | Status: AC
  Administered 2023-08-26: 18.08 via INTRAVENOUS

## 2023-08-26 MED ORDER — RUBIDIUM RB82 GENERATOR (RUBYFILL)
18.2400 | PACK | Freq: Once | INTRAVENOUS | Status: AC
  Administered 2023-08-26: 18.24 via INTRAVENOUS

## 2023-09-01 ENCOUNTER — Ambulatory Visit
Admission: RE | Admit: 2023-09-01 | Discharge: 2023-09-01 | Disposition: A | Discharge status: Home / Self Care | Source: Ambulatory Visit | Attending: Physician Assistant | Admitting: Physician Assistant

## 2023-09-01 DIAGNOSIS — Z1231 Encounter for screening mammogram for malignant neoplasm of breast: Secondary | ICD-10-CM | POA: Diagnosis not present

## 2023-09-17 ENCOUNTER — Encounter: Payer: Self-pay | Admitting: Nurse Practitioner

## 2023-09-17 ENCOUNTER — Ambulatory Visit: Attending: Nurse Practitioner | Admitting: Nurse Practitioner

## 2023-09-17 VITALS — BP 130/70 | HR 66 | Ht 60.0 in | Wt 153.0 lb

## 2023-09-17 DIAGNOSIS — I5032 Chronic diastolic (congestive) heart failure: Secondary | ICD-10-CM | POA: Diagnosis not present

## 2023-09-17 DIAGNOSIS — E119 Type 2 diabetes mellitus without complications: Secondary | ICD-10-CM | POA: Diagnosis not present

## 2023-09-17 DIAGNOSIS — I10 Essential (primary) hypertension: Secondary | ICD-10-CM

## 2023-09-17 DIAGNOSIS — E039 Hypothyroidism, unspecified: Secondary | ICD-10-CM | POA: Diagnosis not present

## 2023-09-17 DIAGNOSIS — I25118 Atherosclerotic heart disease of native coronary artery with other forms of angina pectoris: Secondary | ICD-10-CM

## 2023-09-17 DIAGNOSIS — E785 Hyperlipidemia, unspecified: Secondary | ICD-10-CM | POA: Diagnosis not present

## 2023-09-17 DIAGNOSIS — K551 Chronic vascular disorders of intestine: Secondary | ICD-10-CM | POA: Diagnosis not present

## 2023-09-17 MED ORDER — ISOSORBIDE MONONITRATE ER 30 MG PO TB24: 30.0000 mg | ORAL_TABLET | Freq: Every day | ORAL | 3 refills | Status: DC

## 2023-09-17 NOTE — Patient Instructions (Signed)
 Medication Instructions:  Your physician recommends the following medication changes.  START TAKING: Isosorbide Mononitrate (IMDUR) 30 mg once daily.  *If you need a refill on your cardiac medications before your next appointment, please call your pharmacy*  Lab Work: None ordered at this time  If you have labs (blood work) drawn today and your tests are completely normal, you will receive your results only by: MyChart Message (if you have MyChart) OR A paper copy in the mail If you have any lab test that is abnormal or we need to change your treatment, we will call you to review the results.  Testing/Procedures: None ordered at this time   Follow-Up: At [facility], you and your health needs are our priority.  As part of our continuing mission to provide you with exceptional heart care, our providers are all part of one team.  This team includes your primary Cardiologist (physician) and Advanced Practice Providers or APPs (Physician Assistants and Nurse Practitioners) who all work together to provide you with the care you need, when you need it.  Your next appointment:   1 month(s)  Provider:   Deatrice Cage, MD or Lonni Meager, NP    We recommend signing up for the patient portal called MyChart.  Sign up information is provided on this After Visit Summary.  MyChart is used to connect with patients for Virtual Visits (Telemedicine).  Patients are able to view lab/test results, encounter notes, upcoming appointments, etc.  Non-urgent messages can be sent to your provider as well.   To learn more about what you can do with MyChart, go to [email or web address].

## 2023-09-17 NOTE — Progress Notes (Signed)
 Office Visit    Patient Name: Alexandria Marquez Date of Encounter: 09/17/2023  Primary Care Provider:  Marikay Eva POUR, PA Primary Cardiologist:  Deatrice Cage, MD  Chief Complaint    78 y.o. female w/ a h/o CAD s/p CABG and subsequent RCA stenting, diast dysfxn, mild pulmonary HTN, DM, PAD s/p SMA stenting (05/2018), celiac artery stenting, HTN, HL, hypothyroidism, OSA, depression, anxiety, and GERD, who presents today for angina f/u.   Past Medical History   Subjective   Past Medical History:  Diagnosis Date   Anxiety    Arthritis    Asthma    when young   Coronary artery disease    a. 2008 s/p CABG x 2 (LIMA->LAD, VG->Diag); b. 09/2019 PCI: LM min irregs, LAD 100p/m, 85/42m, LCX 45m/d, RCA 24m (2.0x18 Resolute Onyx DES), VG->D2 ok, LIMA->LAD 50p/d. EF 55-65%; c. 09/2020 MV: EF 83%, no isch/infarct; d. 08/2023 PET stress: EF 72%, small defect w/ mild partial reversibility in the apical inf and lateral segments->infarct w/ peri-infarct isch. Nl wall motion.   Depression    Diabetes mellitus without complication (HCC)    Diastolic dysfunction    a. 08/2019 Echo: EF 60-65%, no rwma, mild LVH, Gr1 DD. RVSP 43.3mmHg. Mild MR, mild to mod TR. AoV sclerosis.   Environmental and seasonal allergies    GERD (gastroesophageal reflux disease)    History of hiatal hernia    Hyperlipidemia    Hypertension    Hypothyroid    Hypothyroidism    Lower extremity edema    Mesenteric ischemia (HCC)    a. 05/2022 s/p Stent to the Celiac artery with 7 mm diameter x 16 mm length balloon.   Motion sickness    OSA (obstructive sleep apnea) 06/04/2014   Osteoarthritis    Scoliosis    Shortness of breath dyspnea    doe   Vertigo    Past Surgical History:  Procedure Laterality Date   ABDOMINAL HYSTERECTOMY     APPENDECTOMY     BACK SURGERY  2014   lumbar fusion/plate/rods/screws   CARDIAC CATHETERIZATION     CATARACT EXTRACTION W/PHACO Left 08/28/2020   Procedure: CATARACT EXTRACTION PHACO  AND INTRAOCULAR LENS PLACEMENT (IOC) LEFT DIABETIC;  Surgeon: Mittie Gaskin, MD;  Location: [facility];  Service: Ophthalmology;  Laterality: Left;  6.59 0:53.9 12.2%   CATARACT EXTRACTION W/PHACO Right 09/11/2020   Procedure: CATARACT EXTRACTION PHACO AND INTRAOCULAR LENS PLACEMENT (IOC) RIGHT DIABETIC;  Surgeon: Mittie Gaskin, MD;  Location: [facility];  Service: Ophthalmology;  Laterality: Right;  4.88 1:01.8   CHOLECYSTECTOMY  2005   COLONOSCOPY WITH PROPOFOL  N/A 01/18/2017   Procedure: COLONOSCOPY WITH PROPOFOL ;  Surgeon: Gaylyn Gladis PENNER, MD;  Location: [facility];  Service: Endoscopy;  Laterality: N/A;   COLONOSCOPY WITH PROPOFOL  N/A 11/16/2022   Procedure: COLONOSCOPY WITH PROPOFOL ;  Surgeon: Maryruth Ole DASEN, MD;  Location: [facility] END[facility]COPY;  Service: Endoscopy;  Laterality: N/A;  DM   CORONARY ARTERY BYPASS GRAFT     2008 2 vessels   CORONARY ARTERY BYPASS GRAFT  2008   CORONARY STENT INTERVENTION  10/18/2019   CORONARY STENT INTERVENTION N/A 10/18/2019   Procedure: CORONARY STENT INTERVENTION;  Surgeon: Cage Deatrice LABOR, MD;  Location: [facility];  Service: Cardiovascular;  Laterality: N/A;   ESOPHAGOGASTRODUODENOSCOPY (EGD) WITH PROPOFOL  N/A 05/16/2018   Procedure: ESOPHAGOGASTRODUODENOSCOPY (EGD) WITH PROPOFOL ;  Surgeon: Gaylyn Gladis PENNER, MD;  Location: [facility]COPY;  Service: Endoscopy;  Laterality: N/A;   ESOPHAGOGASTRODUODENOSCOPY (EGD) WITH PROPOFOL   N/A 11/05/2020   Procedure: ESOPHAGOGASTRODUODENOSCOPY (EGD) WITH PROPOFOL ;  Surgeon: Maryruth Ole DASEN, MD;  Location: [facility] END[facility]COPY;  Service: Endoscopy;  Laterality: N/A;  DM   FRACTURE SURGERY     HERNIA REPAIR  1987   incisional   JOINT REPLACEMENT Right 2017   knee   ORIF ANKLE FRACTURE Left 06/19/2016   Procedure: OPEN REDUCTION INTERNAL FIXATION (ORIF) ANKLE FRACTURE;  Surgeon: Franky Cranker, MD;  Location: [facility];  Service: Orthopedics;  Laterality: Left;    POLYPECTOMY  11/16/2022   Procedure: POLYPECTOMY;  Surgeon: Maryruth Ole DASEN, MD;  Location: [facility] ENDOS[facility];  Service: Endoscopy;;   rcr     RIGHT/LEFT HEART CATH AND CORONARY/GRAFT ANGIOGRAPHY N/A 10/18/2019   Procedure: RIGHT/LEFT HEART CATH AND CORONARY/GRAFT ANGIOGRAPHY;  Surgeon: Darron Deatrice LABOR, MD;  Location: [facility];  Service: Cardiovascular;  Laterality: N/A;   ROTATOR CUFF REPAIR Left 2013   TONSILLECTOMY     TOTAL KNEE ARTHROPLASTY Right 12/19/2015   Procedure: TOTAL KNEE ARTHROPLASTY;  Surgeon: Norleen JINNY Maltos, MD;  Location: [facility];  Service: Orthopedics;  Laterality: Right;   TUBAL LIGATION     VISCERAL ANGIOGRAPHY N/A 05/23/2018   Procedure: VISCERAL ANGIOGRAPHY;  Surgeon: Marea Selinda RAMAN, MD;  Location: [facility];  Service: Cardiovascular;  Laterality: N/A;   VISCERAL ANGIOGRAPHY N/A 05/25/2022   Procedure: VISCERAL ANGIOGRAPHY;  Surgeon: Marea Selinda RAMAN, MD;  Location: [facility];  Service: Cardiovascular;  Laterality: N/A;    Allergies  Allergies  Allergen Reactions   Simethicone Anaphylaxis and Hives   Morphine  And Codeine Nausea And Vomiting    Severe n & v. Any other pain medications are okay   Penicillins Hives    Has patient had a PCN reaction causing immediate rash, facial/tongue/throat swelling, SOB or lightheadedness with hypotension: No Has patient had a PCN reaction causing severe rash involving mucus membranes or skin necrosis: No Has patient had a PCN reaction that required hospitalization No Has patient had a PCN reaction occurring within the last 10 years: No If all of the above answers are NO, then may proceed with Cephalosporin use.        History of Present Illness      77 y.o. y/o female with above past medical history including CAD, diastolic dysfunction, mild pulmonary hypertension, diabetes, peripheral arterial disease status post SMA stenting in March 2020, hypertension, hyperlipidemia, hypothyroidism, sleep apnea,  depression, anxiety, and GERD. She underwent CABG x 2 in 2008 at [facility]. In the setting of mesenteric ischemia, she underwent stenting of the superior mesenteric artery in March 2020, with improvement in abdominal symptoms. In May 2021, she noted progressive exertional dyspnea and chest pain. She underwent stress testing which was nonischemic. Echo showed normal LV function and mild pulmonary hypertension. She continued to have chest pain and dyspnea and subsequently underwent diagnostic catheterization in July 2021, revealing to 85% RCA stenosis, which was successfully treated with a drug-eluting stent. In the setting of bradycardia at follow-up visit in January 2022, beta-blocker therapy was initially held but she was noted to have mild elevation of TSH in the setting of levothyroxine  noncompliance. With resumption of levothyroxine , fatigue symptoms improved she was able to maintain a beta-blocker therapy. July 2022, Ms. Hanisch was noted to have more pronounced lateral T wave inversion. Stress testing showed normal LV function without significant ischemia. Subsequent EGD showed a single submucosal gastric papule with normal esophagus and duodenum.  In 05/2022, she required celiac artery stenting, which resulted in  improvement of GI symptoms.     Ms. Michna was last seen in cardiology clinic in May 2025, at which time she reported a 74-month history of intermittent dyspnea and substernal chest tightness, usually occurring when she reaches above her head to grab something or while washing her hair in the shower, lasting 2 to 5 minutes, resolving with rest..  We obtained a Lexiscan  PET stress test, which showed a small apical inferior and lateral defect with partial reversibility consistent with prior infarct peri-infarct ischemia.  EF 72% without wall motion abnormalities.  The study was felt to be intermediate risk.  Today, she reports that she has continued to have exertional substernal chest tightness associated with  dyspnea, typically occurring a few times per week with higher levels of activity or while washing her hair in the shower.  Again, symptoms last a few minutes and resolved with rest.  She is otherwise not particularly active and does not have any resting symptoms.  She denies palpitations, PND, orthopnea, dizziness, syncope, edema, or early satiety.  At this time, she prefers a conservative medical approach for management. Objective   Home Medications    Current Outpatient Medications  Medication Sig Dispense Refill   acetaminophen  (TYLENOL ) 500 MG tablet Take 1,000 mg by mouth every 6 (six) hours as needed for moderate pain or mild pain.     amLODipine  (NORVASC ) 5 MG tablet Take 1 tablet (5 mg total) by mouth 2 (two) times daily.     aspirin  81 MG tablet Take 1 tablet (81 mg total) by mouth daily. 30 tablet 0   atorvastatin  (LIPITOR) 80 MG tablet TAKE 1 TABLET BY MOUTH EVERYDAY AT BEDTIME 90 tablet 0   B-D UF III MINI PEN NEEDLES 31G X 5 MM MISC by Other route See admin instructions.     clopidogrel  (PLAVIX ) 75 MG tablet TAKE 1 TABLET BY MOUTH EVERY DAY WITH BREAKFAST 90 tablet 3   Continuous Glucose Sensor (FREESTYLE LIBRE 3 PLUS SENSOR) MISC every 14 (fourteen) days.     enalapril  (VASOTEC ) 10 MG tablet TAKE 1 TABLET BY MOUTH TWICE A DAY 180 tablet 1   ezetimibe  (ZETIA ) 10 MG tablet Take 1 tablet (10 mg total) by mouth daily. 90 tablet 2   fluticasone  (FLONASE ) 50 MCG/ACT nasal spray Place 1-2 sprays into both nostrils daily as needed for allergies.      gabapentin  (NEURONTIN ) 300 MG capsule Take 600 mg by mouth at bedtime.     glimepiride  (AMARYL ) 2 MG tablet Take 2 mg by mouth daily with breakfast.     glucose blood (ONETOUCH ULTRA) test strip USE 2 (TWO) TIMES DAILY. USE AS INSTRUCTED.     LANTUS SOLOSTAR 100 UNIT/ML Solostar Pen Inject 10 Units into the skin.     levothyroxine  (SYNTHROID ) 88 MCG tablet Take by mouth.     metFORMIN  (GLUCOPHAGE ) 1000 MG tablet Take 1 tablet (1,000 mg total)  by mouth 2 (two) times daily with a meal. Resume on 10/21/19.     nitroGLYCERIN  (NITROSTAT ) 0.4 MG SL tablet Place 1 tablet (0.4 mg total) under the tongue every 5 (five) minutes as needed for chest pain. 25 tablet 3   ondansetron  (ZOFRAN -ODT) 4 MG disintegrating tablet Take 4 mg by mouth every 8 (eight) hours as needed for nausea or vomiting.     pantoprazole  (PROTONIX ) 40 MG tablet Take 40 mg by mouth daily.      Polyethyl Glycol-Propyl Glycol 0.4-0.3 % SOLN Place 1 drop into both eyes 3 (three) times daily  as needed (for dry eyes).     sertraline  (ZOLOFT ) 100 MG tablet Take 100 mg by mouth daily.     tiZANidine  (ZANAFLEX ) 2 MG tablet Take 2 mg by mouth every 6 (six) hours as needed for muscle spasms.     VITAMIN D PO Take 1 tablet by mouth daily.     No current facility-administered medications for this visit.     Physical Exam    VS:  BP 130/70   Pulse 66   Ht 5' (1.524 m)   Wt 153 lb (69.4 kg)   SpO2 97%   BMI 29.88 kg/m  , BMI Body mass index is 29.88 kg/m.          GEN: Well nourished, well developed, in no acute distress. HEENT: normal. Neck: Supple, no JVD, carotid bruits, or masses. Cardiac: RRR, 2/6 systolic murmur at the upper sternal borders, no rubs or gallops.  No clubbing, cyanosis, edema.  Radials 2+/PT 2+ and equal bilaterally.  Respiratory:  Respirations regular and unlabored, clear to auscultation bilaterally. GI: Soft, nontender, nondistended, BS + x 4. MS: no deformity or atrophy. Skin: warm and dry, no rash. Neuro:  Strength and sensation are intact. Psych: Normal affect.  Accessory Clinical Findings    Lab Results  Component Value Date   WBC 12.5 (H) 08/18/2020   HGB 11.9 (L) 08/18/2020   HCT 35.9 (L) 08/18/2020   MCV 90.7 08/18/2020   PLT 306 08/18/2020   Lab Results  Component Value Date   CREATININE 0.97 05/25/2022   BUN 21 05/25/2022   NA 142 10/17/2020   K 4.5 10/17/2020   CL 105 10/17/2020   CO2 28 10/17/2020   Lab Results   Component Value Date   ALT 14 10/17/2020   AST 18 10/17/2020   ALKPHOS 56 10/17/2020   BILITOT 0.5 10/17/2020   Lab Results  Component Value Date   CHOL 131 10/19/2019   HDL 37 (L) 10/19/2019   LDLCALC 70 10/19/2019   LDLDIRECT 75.0 12/11/2019   TRIG 120 10/19/2019   CHOLHDL 3.5 10/19/2019    Lab Results  Component Value Date   HGBA1C 7.2 (H) 06/02/2016   Lab Results  Component Value Date   TSH 5.157 (H) 03/29/2020       Assessment & Plan    1.  Coronary artery disease/stable angina: Patient with a history of CAD status post CABG x 2 in 2008 with drug-eluting stent placement to the right coronary artery in July 2021.  Grafts were patent at that time while she had a 6% mid to distal circumflex stenosis with subsequent low risk stress test in July 2022.  Over the past 6 months, she has noted more frequent episodes of substernal chest tightness and dyspnea occurring almost exclusively while doing something with her arms above her head such as showering and washing her hair.  Symptoms last a few minutes and resolve with rest.  She has not had any resting angina.  Lexiscan  PET stress test was performed and showed a small apical inferior and lateral defect with partial reversibility consistent with prior infarct peri-infarct ischemia with normal LV function without regional wall motion abnormalities.  We discussed her symptoms and stress test findings today, along with options for management.  We discussed potential pursuit of diagnostic catheterization versus medical therapy and addition of long-acting nitrates.  She would prefer the latter option for the time being and therefore, I have added Imdur 30 mg daily.  She will also remain on  aspirin , statin, clopidogrel , Zetia , calcium  channel blocker, and ACE inhibitor.  We agreed to follow-up in 1 month to reevaluate symptoms, and reconsider diagnostic catheterization.  2.  Primary hypertension: Blood pressure is stable at 130/70 on calcium   channel blocker and ACE inhibitor therapy.  3.  Hyperlipidemia: LDL was 61 in January of this year with normal LFTs at that time.  She remains on statin and Zetia .  4.  Type 2 diabetes mellitus: A1c of 7.1 earlier this year.  Managed by primary care.  She remains on Lantus, Amaryl , and metformin .  5.  Chronic heart failure with preserved ejection fraction: Stable, chronic dyspnea on exertion.  Euvolemic on examination with stable heart rate and blood pressure.  She does not currently acquire diuretic.  6.  Hypothyroidism: TSH normal in January.  She remains on levothyroxine .  7.  Mesenteric ischemia/PAD: Status post prior SMA and more recent celiac artery stenting.  She denies any significant GI symptoms.  Followed by vascular surgery remains on aspirin , Plavix , statin, and Zetia .  8.  Disposition: Adding Imdur 30 mg daily.  Follow-up in clinic in 1 month to reassess symptoms and reconsider diagnostic catheterization.  Lonni Meager, NP 09/17/2023, 3:44 PM

## 2023-09-21 DIAGNOSIS — E1142 Type 2 diabetes mellitus with diabetic polyneuropathy: Secondary | ICD-10-CM | POA: Diagnosis not present

## 2023-09-21 DIAGNOSIS — E039 Hypothyroidism, unspecified: Secondary | ICD-10-CM | POA: Diagnosis not present

## 2023-09-27 ENCOUNTER — Other Ambulatory Visit (INDEPENDENT_AMBULATORY_CARE_PROVIDER_SITE_OTHER): Payer: Self-pay | Admitting: Vascular Surgery

## 2023-09-27 DIAGNOSIS — I774 Celiac artery compression syndrome: Secondary | ICD-10-CM

## 2023-09-28 ENCOUNTER — Ambulatory Visit (INDEPENDENT_AMBULATORY_CARE_PROVIDER_SITE_OTHER)

## 2023-09-28 ENCOUNTER — Encounter (INDEPENDENT_AMBULATORY_CARE_PROVIDER_SITE_OTHER): Payer: Self-pay | Admitting: Vascular Surgery

## 2023-09-28 ENCOUNTER — Ambulatory Visit (INDEPENDENT_AMBULATORY_CARE_PROVIDER_SITE_OTHER): Admitting: Vascular Surgery

## 2023-09-28 VITALS — BP 147/83 | HR 60 | Resp 16 | Wt 152.6 lb

## 2023-09-28 DIAGNOSIS — I10 Essential (primary) hypertension: Secondary | ICD-10-CM | POA: Diagnosis not present

## 2023-09-28 DIAGNOSIS — K551 Chronic vascular disorders of intestine: Secondary | ICD-10-CM

## 2023-09-28 DIAGNOSIS — I774 Celiac artery compression syndrome: Secondary | ICD-10-CM | POA: Diagnosis not present

## 2023-09-28 DIAGNOSIS — E119 Type 2 diabetes mellitus without complications: Secondary | ICD-10-CM | POA: Diagnosis not present

## 2023-09-28 DIAGNOSIS — E785 Hyperlipidemia, unspecified: Secondary | ICD-10-CM

## 2023-09-28 NOTE — Assessment & Plan Note (Signed)
 Duplex today shows these to both be widely patent without significant recurrent stenosis.  Symptoms are improved.  At this point, we can go to annual follow-up with duplex.

## 2023-09-28 NOTE — Progress Notes (Signed)
 MRN : [id number]  Alexandria Marquez is a 78 y.o. (March 06, 1946) female who presents with chief complaint of  Chief Complaint  Patient presents with   Follow-up    Ref [facility] consult celiac compression syndrome  .  History of Present Illness: Patient returns today in follow up of her mesenteric artery disease. She says she is doing better.  No postprandial abdominal pain, food fear, or unintentional weight loss.  She underwent SMA stent placement about 5 years ago and celiac artery stent placement last year.  Duplex today shows these to both be widely patent without significant recurrent stenosis.  Current Outpatient Medications  Medication Sig Dispense Refill   acetaminophen  (TYLENOL ) 500 MG tablet Take 1,000 mg by mouth every 6 (six) hours as needed for moderate pain or mild pain.     amLODipine  (NORVASC ) 5 MG tablet Take 1 tablet (5 mg total) by mouth 2 (two) times daily.     aspirin  81 MG tablet Take 1 tablet (81 mg total) by mouth daily. 30 tablet 0   atorvastatin  (LIPITOR) 80 MG tablet TAKE 1 TABLET BY MOUTH EVERYDAY AT BEDTIME 90 tablet 0   B-D UF III MINI PEN NEEDLES 31G X 5 MM MISC by Other route See admin instructions.     clopidogrel  (PLAVIX ) 75 MG tablet TAKE 1 TABLET BY MOUTH EVERY DAY WITH BREAKFAST 90 tablet 3   Continuous Glucose Sensor (FREESTYLE LIBRE 3 PLUS SENSOR) MISC every 14 (fourteen) days.     enalapril  (VASOTEC ) 10 MG tablet TAKE 1 TABLET BY MOUTH TWICE A DAY 180 tablet 1   ezetimibe  (ZETIA ) 10 MG tablet Take 1 tablet (10 mg total) by mouth daily. 90 tablet 2   fluticasone  (FLONASE ) 50 MCG/ACT nasal spray Place 1-2 sprays into both nostrils daily as needed for allergies.      gabapentin  (NEURONTIN ) 300 MG capsule Take 600 mg by mouth at bedtime.     glimepiride  (AMARYL ) 2 MG tablet Take 2 mg by mouth daily with breakfast.     glucose blood (ONETOUCH ULTRA) test strip USE 2 (TWO) TIMES DAILY. USE AS INSTRUCTED.     isosorbide  mononitrate (IMDUR ) 30 MG 24 hr tablet Take 1  tablet (30 mg total) by mouth daily. 90 tablet 3   LANTUS SOLOSTAR 100 UNIT/ML Solostar Pen Inject 10 Units into the skin.     levothyroxine  (SYNTHROID ) 88 MCG tablet Take by mouth.     metFORMIN  (GLUCOPHAGE ) 1000 MG tablet Take 1 tablet (1,000 mg total) by mouth 2 (two) times daily with a meal. Resume on 10/21/19.     nitroGLYCERIN  (NITROSTAT ) 0.4 MG SL tablet Place 1 tablet (0.4 mg total) under the tongue every 5 (five) minutes as needed for chest pain. 25 tablet 3   ondansetron  (ZOFRAN -ODT) 4 MG disintegrating tablet Take 4 mg by mouth every 8 (eight) hours as needed for nausea or vomiting.     pantoprazole  (PROTONIX ) 40 MG tablet Take 40 mg by mouth daily.      Polyethyl Glycol-Propyl Glycol 0.4-0.3 % SOLN Place 1 drop into both eyes 3 (three) times daily as needed (for dry eyes).     sertraline  (ZOLOFT ) 100 MG tablet Take 100 mg by mouth daily.     tiZANidine  (ZANAFLEX ) 2 MG tablet Take 2 mg by mouth every 6 (six) hours as needed for muscle spasms.     VITAMIN D PO Take 1 tablet by mouth daily.     No current facility-administered medications for this visit.  Past Medical History:  Diagnosis Date   Anxiety    Arthritis    Asthma    when young   Coronary artery disease    a. 2008 s/p CABG x 2 (LIMA->LAD, VG->Diag); b. 09/2019 PCI: LM min irregs, LAD 100p/m, 85/20m, LCX 44m/d, RCA 41m (2.0x18 Resolute Onyx DES), VG->D2 ok, LIMA->LAD 50p/d. EF 55-65%; c. 09/2020 MV: EF 83%, no isch/infarct; d. 08/2023 PET stress: EF 72%, small defect w/ mild partial reversibility in the apical inf and lateral segments->infarct w/ peri-infarct isch. Nl wall motion.   Depression    Diabetes mellitus without complication (HCC)    Diastolic dysfunction    a. 08/2019 Echo: EF 60-65%, no rwma, mild LVH, Gr1 DD. RVSP 43.62mmHg. Mild MR, mild to mod TR. AoV sclerosis.   Environmental and seasonal allergies    GERD (gastroesophageal reflux disease)    History of hiatal hernia    Hyperlipidemia    Hypertension     Hypothyroid    Hypothyroidism    Lower extremity edema    Mesenteric ischemia (HCC)    a. 05/2022 s/p Stent to the Celiac artery with 7 mm diameter x 16 mm length balloon.   Motion sickness    OSA (obstructive sleep apnea) 06/04/2014   Osteoarthritis    Scoliosis    Shortness of breath dyspnea    doe   Vertigo     Past Surgical History:  Procedure Laterality Date   ABDOMINAL HYSTERECTOMY     APPENDECTOMY     BACK SURGERY  2014   lumbar fusion/plate/rods/screws   CARDIAC CATHETERIZATION     CATARACT EXTRACTION W/PHACO Left 08/28/2020   Procedure: CATARACT EXTRACTION PHACO AND INTRAOCULAR LENS PLACEMENT (IOC) LEFT DIABETIC;  Surgeon: Mittie Gaskin, MD;  Location: [facility];  Service: Ophthalmology;  Laterality: Left;  6.59 0:53.9 12.2%   CATARACT EXTRACTION W/PHACO Right 09/11/2020   Procedure: CATARACT EXTRACTION PHACO AND INTRAOCULAR LENS PLACEMENT (IOC) RIGHT DIABETIC;  Surgeon: Mittie Gaskin, MD;  Location: [facility];  Service: Ophthalmology;  Laterality: Right;  4.88 1:01.8   CHOLECYSTECTOMY  2005   COLONOSCOPY WITH PROPOFOL  N/A 01/18/2017   Procedure: COLONOSCOPY WITH PROPOFOL ;  Surgeon: Gaylyn Gladis PENNER, MD;  Location: [facility];  Service: Endoscopy;  Laterality: N/A;   COLONOSCOPY WITH PROPOFOL  N/A 11/16/2022   Procedure: COLONOSCOPY WITH PROPOFOL ;  Surgeon: Maryruth Ole DASEN, MD;  Location: [facility];  Service: Endoscopy;  Laterality: N/A;  DM   CORONARY ARTERY BYPASS GRAFT     2008 2 vessels   CORONARY ARTERY BYPASS GRAFT  2008   CORONARY STENT INTERVENTION  10/18/2019   CORONARY STENT INTERVENTION N/A 10/18/2019   Procedure: CORONARY STENT INTERVENTION;  Surgeon: Darron Deatrice LABOR, MD;  Location: [facility];  Service: Cardiovascular;  Laterality: N/A;   ESOPHAGOGASTRODUODENOSCOPY (EGD) WITH PROPOFOL  N/A 05/16/2018   Procedure: ESOPHAGOGASTRODUODENOSCOPY (EGD) WITH PROPOFOL ;  Surgeon: Gaylyn Gladis PENNER, MD;  Location:  [facility];  Service: Endoscopy;  Laterality: N/A;   ESOPHAGOGASTRODUODENOSCOPY (EGD) WITH PROPOFOL  N/A 11/05/2020   Procedure: ESOPHAGOGASTRODUODENOSCOPY (EGD) WITH PROPOFOL ;  Surgeon: Maryruth Ole DASEN, MD;  Location: [facility] END[facility];  Service: Endoscopy;  Laterality: N/A;  DM   FRACTURE SURGERY     HERNIA REPAIR  1987   incisional   JOINT REPLACEMENT Right 2017   knee   ORIF ANKLE FRACTURE Left 06/19/2016   Procedure: OPEN REDUCTION INTERNAL FIXATION (ORIF) ANKLE FRACTURE;  Surgeon: Franky Cranker, MD;  Location: [facility];  Service: Orthopedics;  Laterality: Left;   POLYPECTOMY  11/16/2022   Procedure: POLYPECTOMY;  Surgeon: Maryruth Ole DASEN, MD;  Location: [facility] ENDOSCOPY;  Service: Endoscopy;;   rcr     RIGHT/LEFT HEART CATH AND CORONARY/GRAFT ANGIOGRAPHY N/A 10/18/2019   Procedure: RIGHT/LEFT HEART CATH AND CORONARY/GRAFT ANGIOGRAPHY;  Surgeon: Darron Deatrice LABOR, MD;  Location: [facility];  Service: Cardiovascular;  Laterality: N/A;   ROTATOR CUFF REPAIR Left 2013   TONSILLECTOMY     TOTAL KNEE ARTHROPLASTY Right 12/19/2015   Procedure: TOTAL KNEE ARTHROPLASTY;  Surgeon: Norleen JINNY Maltos, MD;  Location: [facility];  Service: Orthopedics;  Laterality: Right;   TUBAL LIGATION     VISCERAL ANGIOGRAPHY N/A 05/23/2018   Procedure: VISCERAL ANGIOGRAPHY;  Surgeon: Marea Selinda RAMAN, MD;  Location: [facility];  Service: Cardiovascular;  Laterality: N/A;   VISCERAL ANGIOGRAPHY N/A 05/25/2022   Procedure: VISCERAL ANGIOGRAPHY;  Surgeon: Marea Selinda RAMAN, MD;  Location: [facility];  Service: Cardiovascular;  Laterality: N/A;     Social History   Tobacco Use   Smoking status: Never   Smokeless tobacco: Never  Vaping Use   Vaping status: Never Used  Substance Use Topics   Alcohol  use: No   Drug use: Not Currently    Types: Hydrocodone       Family History  Problem Relation Age of Onset   Breast cancer Maternal Aunt 30   Heart disease Mother    Heart attack Mother     Heart disease Father    Heart attack Father    Diabetes Father    Breast cancer Paternal Grandmother    Cancer Paternal Aunt      Allergies  Allergen Reactions   Simethicone Anaphylaxis and Hives   Morphine  And Codeine Nausea And Vomiting    Severe n & v. Any other pain medications are okay   Penicillins Hives    Has patient had a PCN reaction causing immediate rash, facial/tongue/throat swelling, SOB or lightheadedness with hypotension: No Has patient had a PCN reaction causing severe rash involving mucus membranes or skin necrosis: No Has patient had a PCN reaction that required hospitalization No Has patient had a PCN reaction occurring within the last 10 years: No If all of the above answers are NO, then may proceed with Cephalosporin use.      REVIEW OF SYSTEMS (Negative unless checked)   Constitutional: [] Weight loss  [] Fever  [] Chills Cardiac: [] Chest pain   [] Chest pressure   [] Palpitations   [] Shortness of breath when laying flat   [] Shortness of breath at rest   [x] Shortness of breath with exertion. Vascular:  [] Pain in legs with walking   [] Pain in legs at rest   [] Pain in legs when laying flat   [] Claudication   [] Pain in feet when walking  [] Pain in feet at rest  [] Pain in feet when laying flat   [] History of DVT   [] Phlebitis   [] Swelling in legs   [] Varicose veins   [] Non-healing ulcers Pulmonary:   [] Uses home oxygen   [] Productive cough   [] Hemoptysis   [] Wheeze  [] COPD   [x] Asthma Neurologic:  [] Dizziness  [] Blackouts   [] Seizures   [] History of stroke   [] History of TIA  [] Aphasia   [] Temporary blindness   [] Dysphagia   [] Weakness or numbness in arms   [] Weakness or numbness in legs Musculoskeletal:  [x] Arthritis   [] Joint swelling   [x] Joint pain   [] Low back pain Hematologic:  [] Easy bruising  [] Easy bleeding   [] Hypercoagulable state   [] Anemic  Gastrointestinal:  [x] Blood in stool   [] Vomiting blood  [x] Gastroesophageal reflux/heartburn   [x] Abdominal  pain Genitourinary:  [] Chronic kidney disease   [] Difficult urination  [] Frequent urination  [] Burning with urination   [] Hematuria Skin:  [] Rashes   [] Ulcers   [] Wounds Psychological:  [x] History of anxiety   []  History of major depression.  Physical Examination  BP (!) 147/83   Pulse 60   Resp 16   Wt 152 lb 9.6 oz (69.2 kg)   BMI 29.80 kg/m  Gen:  WD/WN, NAD. Appears younger than stated age. Head: [place]/AT, No temporalis wasting. Ear/Nose/Throat: Hearing grossly intact, nares w/o erythema or drainage Eyes: Conjunctiva clear. Sclera non-icteric Neck: Supple.  Trachea midline Pulmonary:  Good air movement, no use of accessory muscles.  Cardiac: RRR, no JVD Vascular:  Vessel Right Left  Radial Palpable Palpable                                   Gastrointestinal: soft, non-tender/non-distended. No guarding/reflex.  Musculoskeletal: M/S 5/5 throughout.  No deformity or atrophy. No edema. Neurologic: Sensation grossly intact in extremities.  Symmetrical.  Speech is fluent.  Psychiatric: Judgment intact, Mood & affect appropriate for pt's clinical situation. Dermatologic: No rashes or ulcers noted.  No cellulitis or open wounds.      Labs Recent Results (from the past 2160 hours)  NM PET CT CARDIAC PERFUSION MULTI W/ABSOLUTE BLOODFLOW     Status: None   Collection Time: 08/26/23  2:57 PM  Result Value Ref Range   Rest Nuclear Isotope Dose 18.1 mCi   Stress Nuclear Isotope Dose 18.2 mCi   Rest HR 64.0 bpm   Rest BP 143/54 mmHg   Peak HR 76 bpm   Peak BP 167/52 mmHg   SSS 2.0    SRS 0.0    TID 1.12    Nuc Stress EF 72 %   Nuc Rest EF 72 %   LV sys vol 18.0 mL   LV dias vol 67.0 46 - 106 mL   Rest MBF 0.94 ml/g/min   Stress MBF 1.61 ml/g/min   MBFR 1.71     Radiology MM 3D SCREENING MAMMOGRAM BILATERAL BREAST Result Date: 09/03/2023 CLINICAL DATA:  Screening. EXAM: DIGITAL SCREENING BILATERAL MAMMOGRAM WITH TOMOSYNTHESIS AND CAD TECHNIQUE: Bilateral screening  digital craniocaudal and mediolateral oblique mammograms were obtained. Bilateral screening digital breast tomosynthesis was performed. The images were evaluated with computer-aided detection. COMPARISON:  Previous exam(s). ACR Breast Density Category b: There are scattered areas of fibroglandular density. FINDINGS: There are no findings suspicious for malignancy. IMPRESSION: No mammographic evidence of malignancy. A result letter of this screening mammogram will be mailed directly to the patient. RECOMMENDATION: Screening mammogram in one year. (Code:SM-B-01Y) BI-RADS CATEGORY  1: Negative. Electronically Signed   By: Inocente Ast M.D.   On: 09/03/2023 11:41    Assessment/Plan  Chronic mesenteric ischemia (HCC) Duplex today shows these to both be widely patent without significant recurrent stenosis.  Symptoms are improved.  At this point, we can go to annual follow-up with duplex.  HLD (hyperlipidemia) lipid control important in reducing the progression of atherosclerotic disease.      HTN (hypertension) blood pressure control important in reducing the progression of atherosclerotic disease. On appropriate oral medications.     Diabetes (HCC) blood glucose control important in reducing the progression of atherosclerotic disease. Also, involved in wound healing. On appropriate medications.  Selinda Gu, MD  09/28/2023 12:37 PM    This note was created with Dragon medical transcription system.  Any errors from dictation are purely unintentional

## 2023-09-29 DIAGNOSIS — F325 Major depressive disorder, single episode, in full remission: Secondary | ICD-10-CM | POA: Diagnosis not present

## 2023-09-29 DIAGNOSIS — E782 Mixed hyperlipidemia: Secondary | ICD-10-CM | POA: Diagnosis not present

## 2023-09-29 DIAGNOSIS — I10 Essential (primary) hypertension: Secondary | ICD-10-CM | POA: Diagnosis not present

## 2023-09-29 DIAGNOSIS — I5032 Chronic diastolic (congestive) heart failure: Secondary | ICD-10-CM | POA: Diagnosis not present

## 2023-09-29 DIAGNOSIS — E039 Hypothyroidism, unspecified: Secondary | ICD-10-CM | POA: Diagnosis not present

## 2023-09-29 DIAGNOSIS — N1831 Chronic kidney disease, stage 3a: Secondary | ICD-10-CM | POA: Diagnosis not present

## 2023-09-29 DIAGNOSIS — I25118 Atherosclerotic heart disease of native coronary artery with other forms of angina pectoris: Secondary | ICD-10-CM | POA: Diagnosis not present

## 2023-09-29 DIAGNOSIS — E119 Type 2 diabetes mellitus without complications: Secondary | ICD-10-CM | POA: Diagnosis not present

## 2023-10-12 ENCOUNTER — Other Ambulatory Visit: Payer: Self-pay | Admitting: Cardiovascular Disease

## 2023-10-12 ENCOUNTER — Other Ambulatory Visit: Payer: Self-pay | Admitting: Nurse Practitioner

## 2023-10-20 DIAGNOSIS — M810 Age-related osteoporosis without current pathological fracture: Secondary | ICD-10-CM | POA: Diagnosis not present

## 2023-10-20 DIAGNOSIS — N1832 Chronic kidney disease, stage 3b: Secondary | ICD-10-CM | POA: Diagnosis not present

## 2023-10-20 DIAGNOSIS — E1122 Type 2 diabetes mellitus with diabetic chronic kidney disease: Secondary | ICD-10-CM | POA: Diagnosis not present

## 2023-10-20 DIAGNOSIS — E039 Hypothyroidism, unspecified: Secondary | ICD-10-CM | POA: Diagnosis not present

## 2023-10-20 DIAGNOSIS — Z794 Long term (current) use of insulin: Secondary | ICD-10-CM | POA: Diagnosis not present

## 2023-10-20 DIAGNOSIS — E1142 Type 2 diabetes mellitus with diabetic polyneuropathy: Secondary | ICD-10-CM | POA: Diagnosis not present

## 2023-10-29 ENCOUNTER — Encounter: Payer: Self-pay | Admitting: Nurse Practitioner

## 2023-10-29 ENCOUNTER — Ambulatory Visit: Attending: Nurse Practitioner | Admitting: Nurse Practitioner

## 2023-10-29 VITALS — BP 124/64 | HR 63 | Ht 60.0 in | Wt 152.4 lb

## 2023-10-29 DIAGNOSIS — E785 Hyperlipidemia, unspecified: Secondary | ICD-10-CM

## 2023-10-29 DIAGNOSIS — K551 Chronic vascular disorders of intestine: Secondary | ICD-10-CM

## 2023-10-29 DIAGNOSIS — I25118 Atherosclerotic heart disease of native coronary artery with other forms of angina pectoris: Secondary | ICD-10-CM

## 2023-10-29 DIAGNOSIS — I5032 Chronic diastolic (congestive) heart failure: Secondary | ICD-10-CM | POA: Diagnosis not present

## 2023-10-29 DIAGNOSIS — I10 Essential (primary) hypertension: Secondary | ICD-10-CM

## 2023-10-29 DIAGNOSIS — E039 Hypothyroidism, unspecified: Secondary | ICD-10-CM

## 2023-10-29 DIAGNOSIS — E119 Type 2 diabetes mellitus without complications: Secondary | ICD-10-CM | POA: Diagnosis not present

## 2023-10-29 MED ORDER — ISOSORBIDE MONONITRATE ER 60 MG PO TB24: 60.0000 mg | ORAL_TABLET | Freq: Every day | ORAL | 3 refills | Status: AC

## 2023-10-29 NOTE — Progress Notes (Signed)
 Office Visit    Patient Name: Alexandria Marquez Date of Encounter: 10/29/2023  Primary Care Provider:  Marikay Eva POUR, PA Primary Cardiologist:  Deatrice Cage, MD  Cardiology APP:  Vivienne Lonni Ingle, NP   Chief Complaint    78 y.o. female w/ a h/o CAD s/p CABG and subsequent RCA stenting, diast dysfxn, mild pulmonary HTN, DM, PAD s/p SMA stenting (05/2018), celiac artery stenting, HTN, HL, hypothyroidism, OSA, depression, anxiety, and GERD, who presents today for angina f/u.   Past Medical History   Subjective   Past Medical History:  Diagnosis Date   Anxiety    Arthritis    Asthma    when young   Coronary artery disease    a. 2008 s/p CABG x 2 (LIMA->LAD, VG->Diag); b. 09/2019 PCI: LM min irregs, LAD 100p/m, 85/30m, LCX 49m/d, RCA 35m (2.0x18 Resolute Onyx DES), VG->D2 ok, LIMA->LAD 50p/d. EF 55-65%; c. 09/2020 MV: EF 83%, no isch/infarct; d. 08/2023 PET stress: EF 72%, small defect w/ mild partial reversibility in the apical inf and lateral segments->infarct w/ peri-infarct isch. Nl wall motion.   Depression    Diabetes mellitus without complication (HCC)    Diastolic dysfunction    a. 08/2019 Echo: EF 60-65%, no rwma, mild LVH, Gr1 DD. RVSP 43.40mmHg. Mild MR, mild to mod TR. AoV sclerosis.   Environmental and seasonal allergies    GERD (gastroesophageal reflux disease)    History of hiatal hernia    Hyperlipidemia    Hypertension    Hypothyroid    Hypothyroidism    Lower extremity edema    Mesenteric ischemia (HCC)    a. 05/2022 s/p Stent to the Celiac artery with 7 mm diameter x 16 mm length balloon.   Motion sickness    OSA (obstructive sleep apnea) 06/04/2014   Osteoarthritis    Scoliosis    Shortness of breath dyspnea    doe   Vertigo    Past Surgical History:  Procedure Laterality Date   ABDOMINAL HYSTERECTOMY     APPENDECTOMY     BACK SURGERY  2014   lumbar fusion/plate/rods/screws   CARDIAC CATHETERIZATION     CATARACT EXTRACTION W/PHACO Left  08/28/2020   Procedure: CATARACT EXTRACTION PHACO AND INTRAOCULAR LENS PLACEMENT (IOC) LEFT DIABETIC;  Surgeon: Mittie Gaskin, MD;  Location: [facility];  Service: Ophthalmology;  Laterality: Left;  6.59 0:53.9 12.2%   CATARACT EXTRACTION W/PHACO Right 09/11/2020   Procedure: CATARACT EXTRACTION PHACO AND INTRAOCULAR LENS PLACEMENT (IOC) RIGHT DIABETIC;  Surgeon: Mittie Gaskin, MD;  Location: [facility];  Service: Ophthalmology;  Laterality: Right;  4.88 1:01.8   CHOLECYSTECTOMY  2005   COLONOSCOPY WITH PROPOFOL  N/A 01/18/2017   Procedure: COLONOSCOPY WITH PROPOFOL ;  Surgeon: Gaylyn Gladis PENNER, MD;  Location: [facility] END[facility];  Service: Endoscopy;  Laterality: N/A;   COLONOSCOPY WITH PROPOFOL  N/A 11/16/2022   Procedure: COLONOSCOPY WITH PROPOFOL ;  Surgeon: Maryruth Ole DASEN, MD;  Location: [facility] ENDOSCOPY;  Service: Endoscopy;  Laterality: N/A;  DM   CORONARY ARTERY BYPASS GRAFT     2008 2 vessels   CORONARY ARTERY BYPASS GRAFT  2008   CORONARY STENT INTERVENTION  10/18/2019   CORONARY STENT INTERVENTION N/A 10/18/2019   Procedure: CORONARY STENT INTERVENTION;  Surgeon: Cage Deatrice LABOR, MD;  Location: [facility];  Service: Cardiovascular;  Laterality: N/A;   ESOPHAGOGASTRODUODENOSCOPY (EGD) WITH PROPOFOL  N/A 05/16/2018   Procedure: ESOPHAGOGASTRODUODENOSCOPY (EGD) WITH PROPOFOL ;  Surgeon: Gaylyn Gladis PENNER, MD;  Location: [facility] ENDOSCOPY;  Service: Endoscopy;  Laterality: N/A;   ESOPHAGOGASTRODUODENOSCOPY (EGD) WITH PROPOFOL  N/A 11/05/2020   Procedure: ESOPHAGOGASTRODUODENOSCOPY (EGD) WITH PROPOFOL ;  Surgeon: Maryruth Ole DASEN, MD;  Location: [facility] ENDOSCOPY;  Service: Endoscopy;  Laterality: N/A;  DM   FRACTURE SURGERY     HERNIA REPAIR  1987   incisional   JOINT REPLACEMENT Right 2017   knee   ORIF ANKLE FRACTURE Left 06/19/2016   Procedure: OPEN REDUCTION INTERNAL FIXATION (ORIF) ANKLE FRACTURE;  Surgeon: Franky Cranker, MD;  Location: [facility];   Service: Orthopedics;  Laterality: Left;   POLYPECTOMY  11/16/2022   Procedure: POLYPECTOMY;  Surgeon: Maryruth Ole DASEN, MD;  Location: [facility] ENDOSCOPY;  Service: Endoscopy;;   rcr     RIGHT/LEFT HEART CATH AND CORONARY/GRAFT ANGIOGRAPHY N/A 10/18/2019   Procedure: RIGHT/LEFT HEART CATH AND CORONARY/GRAFT ANGIOGRAPHY;  Surgeon: Darron Deatrice LABOR, MD;  Location: [facility];  Service: Cardiovascular;  Laterality: N/A;   ROTATOR CUFF REPAIR Left 2013   TONSILLECTOMY     TOTAL KNEE ARTHROPLASTY Right 12/19/2015   Procedure: TOTAL KNEE ARTHROPLASTY;  Surgeon: Norleen JINNY Maltos, MD;  Location: [facility];  Service: Orthopedics;  Laterality: Right;   TUBAL LIGATION     VISCERAL ANGIOGRAPHY N/A 05/23/2018   Procedure: VISCERAL ANGIOGRAPHY;  Surgeon: Marea Selinda RAMAN, MD;  Location: [facility];  Service: Cardiovascular;  Laterality: N/A;   VISCERAL ANGIOGRAPHY N/A 05/25/2022   Procedure: VISCERAL ANGIOGRAPHY;  Surgeon: Marea Selinda RAMAN, MD;  Location: [facility];  Service: Cardiovascular;  Laterality: N/A;    Allergies  Allergies  Allergen Reactions   Simethicone Anaphylaxis and Hives   Morphine  And Codeine Nausea And Vomiting    Severe n & v. Any other pain medications are okay   Penicillins Hives    Has patient had a PCN reaction causing immediate rash, facial/tongue/throat swelling, SOB or lightheadedness with hypotension: No Has patient had a PCN reaction causing severe rash involving mucus membranes or skin necrosis: No Has patient had a PCN reaction that required hospitalization No Has patient had a PCN reaction occurring within the last 10 years: No If all of the above answers are NO, then may proceed with Cephalosporin use.        History of Present Illness      78 y.o. y/o female with above past medical history including CAD, diastolic dysfunction, mild pulmonary hypertension, diabetes, peripheral arterial disease status post SMA stenting in March 2020, hypertension,  hyperlipidemia, hypothyroidism, sleep apnea, depression, anxiety, and GERD. She underwent CABG x 2 in 2008 at [facility] In the setting of mesenteric ischemia, she underwent stenting of the superior mesenteric artery in March 2020, with improvement in abdominal symptoms.   In May 2021, she noted progressive exertional dyspnea and chest pain. She underwent stress testing which was nonischemic. Echo showed normal LV function and mild pulmonary hypertension. She continued to have chest pain and dyspnea and subsequently underwent diagnostic catheterization in July 2021, revealing to 85% RCA stenosis, which was successfully treated with a drug-eluting stent.   In the setting of bradycardia at follow-up visit in January 2022, beta-blocker therapy was initially held but she was noted to have mild elevation of TSH in the setting of levothyroxine  noncompliance. With resumption of levothyroxine , fatigue symptoms improved she was able to maintain a beta-blocker therapy.   In July 2022, Ms. Tobey was noted to have more pronounced lateral T wave inversion. Stress testing showed normal LV function without significant ischemia. Subsequent EGD showed a single submucosal gastric papule with  normal esophagus and duodenum.  In 05/2022, she required celiac artery stenting, which resulted in improvement of GI symptoms.     In May 2025, Ms. Acoff reported a 89-month history of intermittent dyspnea and substernal chest tightness.  Lexiscan  PET stress test was obtained and showed a small apical inferior and lateral defect with partial reversibility consistent with prior infarct and peri-infarct ischemia.  EF 72% without wall motion abnormalities.  The study was felt to be intermediate risk.  In the setting of ongoing exertional symptoms at June 2025 visit, Imdur  30 mg daily was added.   Since the addition of Imdur  30 mg daily at her June 2025 visit, Ms. Dano has noted some improvement in chest pain symptoms.  Symptoms now only  occurring a few times per month usually with higher levels of activity and still predominantly while exerting and using her upper body.  She continues to have dyspnea on exertion with ambulation but she attributes this to progressive deconditioning.  She continues to prefer to avoid diagnostic catheterization.  She denies palpitations, PND, orthopnea, dizziness, syncope, edema, or early satiety. Objective   Home Medications    Current Outpatient Medications  Medication Sig Dispense Refill   acetaminophen  (TYLENOL ) 500 MG tablet Take 1,000 mg by mouth every 6 (six) hours as needed for moderate pain or mild pain.     amLODipine  (NORVASC ) 5 MG tablet Take 1 tablet (5 mg total) by mouth 2 (two) times daily.     aspirin  81 MG tablet Take 1 tablet (81 mg total) by mouth daily. 30 tablet 0   atorvastatin  (LIPITOR) 80 MG tablet TAKE 1 TABLET BY MOUTH EVERYDAY AT BEDTIME 90 tablet 3   B-D UF III MINI PEN NEEDLES 31G X 5 MM MISC by Other route See admin instructions.     clopidogrel  (PLAVIX ) 75 MG tablet TAKE 1 TABLET BY MOUTH EVERY DAY WITH BREAKFAST 90 tablet 3   Continuous Glucose Sensor (FREESTYLE LIBRE 3 PLUS SENSOR) MISC every 14 (fourteen) days.     enalapril  (VASOTEC ) 10 MG tablet TAKE 1 TABLET BY MOUTH TWICE A DAY 180 tablet 3   ezetimibe  (ZETIA ) 10 MG tablet Take 1 tablet (10 mg total) by mouth daily. 90 tablet 2   fluticasone  (FLONASE ) 50 MCG/ACT nasal spray Place 1-2 sprays into both nostrils daily as needed for allergies.      gabapentin  (NEURONTIN ) 300 MG capsule Take 600 mg by mouth at bedtime.     glimepiride  (AMARYL ) 2 MG tablet Take 2 mg by mouth daily with breakfast.     glucose blood (ONETOUCH ULTRA) test strip USE 2 (TWO) TIMES DAILY. USE AS INSTRUCTED.     LANTUS SOLOSTAR 100 UNIT/ML Solostar Pen Inject 10 Units into the skin. (Patient taking differently: Inject 6 Units into the skin.)     levothyroxine  (SYNTHROID ) 88 MCG tablet Take by mouth.     metFORMIN  (GLUCOPHAGE ) 1000 MG  tablet Take 1 tablet (1,000 mg total) by mouth 2 (two) times daily with a meal. Resume on 10/21/19.     ondansetron  (ZOFRAN -ODT) 4 MG disintegrating tablet Take 4 mg by mouth every 8 (eight) hours as needed for nausea or vomiting.     pantoprazole  (PROTONIX ) 40 MG tablet Take 40 mg by mouth daily.      Polyethyl Glycol-Propyl Glycol 0.4-0.3 % SOLN Place 1 drop into both eyes 3 (three) times daily as needed (for dry eyes).     sertraline  (ZOLOFT ) 100 MG tablet Take 100 mg by mouth daily.  tiZANidine  (ZANAFLEX ) 2 MG tablet Take 2 mg by mouth every 6 (six) hours as needed for muscle spasms.     VITAMIN D PO Take 1 tablet by mouth daily.     isosorbide  mononitrate (IMDUR ) 60 MG 24 hr tablet Take 1 tablet (60 mg total) by mouth daily. 90 tablet 3   nitroGLYCERIN  (NITROSTAT ) 0.4 MG SL tablet Place 1 tablet (0.4 mg total) under the tongue every 5 (five) minutes as needed for chest pain. (Patient not taking: Reported on 10/29/2023) 25 tablet 3   No current facility-administered medications for this visit.     Physical Exam    VS:  BP 124/64   Pulse 63   Ht 5' (1.524 m)   Wt 152 lb 6.4 oz (69.1 kg)   SpO2 97%   BMI 29.76 kg/m  , BMI Body mass index is 29.76 kg/m.          GEN: Well nourished, well developed, in no acute distress. HEENT: normal. Neck: Supple, no JVD, carotid bruits, or masses. Cardiac: RRR, 1/6 systolic murmur at the upper sternal borders.  No rubs or gallops. No clubbing, cyanosis, edema.  Radials 2+/PT 2+ and equal bilaterally.  Respiratory:  Respirations regular and unlabored, clear to auscultation bilaterally. GI: Soft, nontender, nondistended, BS + x 4. MS: no deformity or atrophy. Skin: warm and dry, no rash. Neuro:  Strength and sensation are intact. Psych: Normal affect.  Accessory Clinical Findings    ECG personally reviewed by me today - EKG Interpretation Date/Time:  Friday October 29 2023 14:50:55 EDT Ventricular Rate:  63 PR Interval:  134 QRS  Duration:  102 QT Interval:  400 QTC Calculation: 409 R Axis:   -3  Text Interpretation: Normal sinus rhythm Incomplete right bundle branch block Nonspecific ST and T wave abnormality Confirmed by Vivienne Bruckner [phone]) on 10/29/2023 3:03:56 PM  - no acute changes.  Lab Results  Component Value Date   WBC 12.5 (H) 08/18/2020   HGB 11.9 (L) 08/18/2020   HCT 35.9 (L) 08/18/2020   MCV 90.7 08/18/2020   PLT 306 08/18/2020   Lab Results  Component Value Date   CREATININE 0.97 05/25/2022   BUN 21 05/25/2022   NA 142 10/17/2020   K 4.5 10/17/2020   CL 105 10/17/2020   CO2 28 10/17/2020   Lab Results  Component Value Date   ALT 14 10/17/2020   AST 18 10/17/2020   ALKPHOS 56 10/17/2020   BILITOT 0.5 10/17/2020   Lab Results  Component Value Date   CHOL 131 10/19/2019   HDL 37 (L) 10/19/2019   LDLCALC 70 10/19/2019   LDLDIRECT 75.0 12/11/2019   TRIG 120 10/19/2019   CHOLHDL 3.5 10/19/2019    Lab Results  Component Value Date   HGBA1C 7.2 (H) 06/02/2016   Lab Results  Component Value Date   TSH 5.157 (H) 03/29/2020       Assessment & Plan    1.  Coronary artery disease/stable angina: Patient with a history of CAD status post CABG x 2 in 2008 with drug-eluting stent placement to the right coronary artery in July 2021.  Grafts were patent at that time while she had a 60% mid to distal circumflex stenosis with subsequent low risk stress test in July 2022.  Over the past 7 months, she has been having exertional angina, almost exclusively when exerting herself and also using her upper body such as when showering or washing her hair.  Symptoms resolved with rest.  Lexiscan   PET stress test earlier in the summer showed a small apical inferior lateral defect with partial reversibility consistent with prior infarct and peri-infarct ischemia.  LV function was normal without regional motion abnormalities and the study was felt to be intermediate.  We added Imdur  30 mg daily at her June  2025 visit.  Since then, she has had some reduction in frequency of symptoms, though she continues to experience dyspnea on exertion.  She is euvolemic on examination.  We she does not wish to proceed with diagnostic catheterization and we mutually agreed to increase her Imdur  to 60 mg daily.  She remains on aspirin , statin, clopidogrel , Zetia , calcium  channel blocker, and ACE inhibitor.  2.  Primary hypertension: Stable on calcium  channel blocker and ACE inhibitor therapy.  3.  Hyperlipidemia: LDL of 61 in January 2025 with normal LFTs.  She remains on statin and Zetia .  4.  Type 2 diabetes mellitus: A1c 7.1 in July.  She is managed by primary care and remains on Lantus, Amaryl , and metformin .  5.  Chronic heart failure with preserved ejection fraction: She suspects she has had some progression of baseline level of dyspnea on exertion in the setting of deconditioning.  Overall, exertional angina has improved.  Titrating nitrates.  She is euvolemic on examination.  She does not currently require a diuretic.  6.  Hypothyroidism: TSH was normal in July.  She remains on levothyroxine .  7.  Mesenteric ischemia/PAD: Status post prior SMA and more recent celiac artery stenting.  GI symptoms improved.  She is followed by vascular surgery remains on aspirin , Plavix , statin, and Zetia .  8.  Disposition: Increasing Imdur  to 60 mg daily.  Patient prefers to push follow-up out to 3 months.  She understands that she can always be worked in sooner if necessary.  Lonni Meager, NP 10/29/2023, 5:42 PM

## 2023-10-29 NOTE — Patient Instructions (Signed)
 Medication Instructions:  Your physician recommends the following medication changes.  INCREASE: Isosorbide  mononitrate from 30 mg to 60 mg by mouth once daily.   *If you need a refill on your cardiac medications before your next appointment, please call your pharmacy*  Lab Work: No labs ordered today  If you have labs (blood work) drawn today and your tests are completely normal, you will receive your results only by: MyChart Message (if you have MyChart) OR A paper copy in the mail If you have any lab test that is abnormal or we need to change your treatment, we will call you to review the results.  Testing/Procedures: No test ordered today   Follow-Up: At [facility], you and your health needs are our priority.  As part of our continuing mission to provide you with exceptional heart care, our providers are all part of one team.  This team includes your primary Cardiologist (physician) and Advanced Practice Providers or APPs (Physician Assistants and Nurse Practitioners) who all work together to provide you with the care you need, when you need it.  Your next appointment:   3 month(s)  Provider:   Deatrice Cage, MD    We recommend signing up for the patient portal called MyChart.  Sign up information is provided on this After Visit Summary.  MyChart is used to connect with patients for Virtual Visits (Telemedicine).  Patients are able to view lab/test results, encounter notes, upcoming appointments, etc.  Non-urgent messages can be sent to your provider as well.   To learn more about what you can do with MyChart, go to [email or web address].

## 2024-02-01 ENCOUNTER — Ambulatory Visit: Attending: Cardiovascular Disease | Admitting: Cardiovascular Disease

## 2024-02-01 ENCOUNTER — Encounter: Payer: Self-pay | Admitting: Cardiovascular Disease

## 2024-02-01 VITALS — BP 144/78 | HR 60 | Ht 60.0 in | Wt 153.5 lb

## 2024-02-01 DIAGNOSIS — I25118 Atherosclerotic heart disease of native coronary artery with other forms of angina pectoris: Secondary | ICD-10-CM

## 2024-02-01 DIAGNOSIS — E785 Hyperlipidemia, unspecified: Secondary | ICD-10-CM | POA: Diagnosis not present

## 2024-02-01 DIAGNOSIS — I10 Essential (primary) hypertension: Secondary | ICD-10-CM | POA: Diagnosis not present

## 2024-02-01 NOTE — Progress Notes (Signed)
 Cardiology Office Note   Date:  02/01/2024   ID:  Virda, Betters 01-12-1946, MRN [id number]  PCP:  Marikay Eva POUR, PA  Cardiologist:   Deatrice Cage, MD   Chief Complaint  Patient presents with   Follow-up    3 month f/u no complaints today. Meds reviewed verbally with pt.      History of Present Illness: KAMBRIA GRIMA is a 78 y.o. female who presents for a follow-up visit regarding coronary artery disease.  She has chronic medical conditions that include essential hypertension, type 2 diabetes, SMA stent, essential hypertension, hyperlipidemia, hypothyroidism, sleep apnea, anxiety and GERD.   She had CABG in 2008 at [facility].  She had progressive angina in May 2021.  Echocardiogram showed normal LV systolic function with mild pulmonary hypertension.  She underwent cardiac catheterization in July 2021 which showed patent grafts with significant mid RCA stenosis which was not bypassed.  I performed successful PCI and 2 overlapped drug-eluting stent placement.  She had a subsequent Lexiscan  Myoview  in July 2022 which showed no evidence of ischemia.  She was seen few months ago for symptoms of stable angina.  Cardiac PET scan showed small apical inferior and lateral defect with partial reversibility with normal ejection fraction.  Imdur  30 mg once daily was added.  She was seen 3 months ago and the dose was increased to 60 mg once daily.  Since that time, her chest pain resolved.  She continues to struggle with intermittent abdominal pain.  She had previous SMA and celiac artery stent.    Past Medical History:  Diagnosis Date   Anxiety    Arthritis    Asthma    when young   Coronary artery disease    a. 2008 s/p CABG x 2 (LIMA->LAD, VG->Diag); b. 09/2019 PCI: LM min irregs, LAD 100p/m, 85/77m, LCX 65m/d, RCA 6m (2.0x18 Resolute Onyx DES), VG->D2 ok, LIMA->LAD 50p/d. EF 55-65%; c. 09/2020 MV: EF 83%, no isch/infarct; d. 08/2023 PET stress: EF 72%, small defect w/ mild partial  reversibility in the apical inf and lateral segments->infarct w/ peri-infarct isch. Nl wall motion.   Depression    Diabetes mellitus without complication (HCC)    Diastolic dysfunction    a. 08/2019 Echo: EF 60-65%, no rwma, mild LVH, Gr1 DD. RVSP 43.35mmHg. Mild MR, mild to mod TR. AoV sclerosis.   Environmental and seasonal allergies    GERD (gastroesophageal reflux disease)    History of hiatal hernia    Hyperlipidemia    Hypertension    Hypothyroid    Hypothyroidism    Lower extremity edema    Mesenteric ischemia    a. 05/2022 s/p Stent to the Celiac artery with 7 mm diameter x 16 mm length balloon.   Motion sickness    OSA (obstructive sleep apnea) 06/04/2014   Osteoarthritis    Scoliosis    Shortness of breath dyspnea    doe   Vertigo     Past Surgical History:  Procedure Laterality Date   ABDOMINAL HYSTERECTOMY     APPENDECTOMY     BACK SURGERY  2014   lumbar fusion/plate/rods/screws   CARDIAC CATHETERIZATION     CATARACT EXTRACTION W/PHACO Left 08/28/2020   Procedure: CATARACT EXTRACTION PHACO AND INTRAOCULAR LENS PLACEMENT (IOC) LEFT DIABETIC;  Surgeon: Mittie Gaskin, MD;  Location: [facility];  Service: Ophthalmology;  Laterality: Left;  6.59 0:53.9 12.2%   CATARACT EXTRACTION W/PHACO Right 09/11/2020   Procedure: CATARACT EXTRACTION PHACO AND INTRAOCULAR LENS PLACEMENT (  IOC) RIGHT DIABETIC;  Surgeon: Mittie Gaskin, MD;  Location: [facility];  Service: Ophthalmology;  Laterality: Right;  4.88 1:01.8   CHOLECYSTECTOMY  2005   COLONOSCOPY WITH PROPOFOL  N/A 01/18/2017   Procedure: COLONOSCOPY WITH PROPOFOL ;  Surgeon: Gaylyn Gladis PENNER, MD;  Location: [facility];  Service: Endoscopy;  Laterality: N/A;   COLONOSCOPY WITH PROPOFOL  N/A 11/16/2022   Procedure: COLONOSCOPY WITH PROPOFOL ;  Surgeon: Maryruth Ole DASEN, MD;  Location: [facility];  Service: Endoscopy;  Laterality: N/A;  DM   CORONARY ARTERY BYPASS GRAFT     2008 2 vessels    CORONARY ARTERY BYPASS GRAFT  2008   CORONARY STENT INTERVENTION  10/18/2019   CORONARY STENT INTERVENTION N/A 10/18/2019   Procedure: CORONARY STENT INTERVENTION;  Surgeon: Darron Deatrice LABOR, MD;  Location: [facility];  Service: Cardiovascular;  Laterality: N/A;   ESOPHAGOGASTRODUODENOSCOPY (EGD) WITH PROPOFOL  N/A 05/16/2018   Procedure: ESOPHAGOGASTRODUODENOSCOPY (EGD) WITH PROPOFOL ;  Surgeon: Gaylyn Gladis PENNER, MD;  Location: [facility];  Service: Endoscopy;  Laterality: N/A;   ESOPHAGOGASTRODUODENOSCOPY (EGD) WITH PROPOFOL  N/A 11/05/2020   Procedure: ESOPHAGOGASTRODUODENOSCOPY (EGD) WITH PROPOFOL ;  Surgeon: Maryruth Ole DASEN, MD;  Location: [facility];  Service: Endoscopy;  Laterality: N/A;  DM   FRACTURE SURGERY     HERNIA REPAIR  1987   incisional   JOINT REPLACEMENT Right 2017   knee   ORIF ANKLE FRACTURE Left 06/19/2016   Procedure: OPEN REDUCTION INTERNAL FIXATION (ORIF) ANKLE FRACTURE;  Surgeon: Franky Cranker, MD;  Location: [facility];  Service: Orthopedics;  Laterality: Left;   POLYPECTOMY  11/16/2022   Procedure: POLYPECTOMY;  Surgeon: Maryruth Ole DASEN, MD;  Location: [facility];  Service: Endoscopy;;   rcr     RIGHT/LEFT HEART CATH AND CORONARY/GRAFT ANGIOGRAPHY N/A 10/18/2019   Procedure: RIGHT/LEFT HEART CATH AND CORONARY/GRAFT ANGIOGRAPHY;  Surgeon: Darron Deatrice LABOR, MD;  Location: [facility];  Service: Cardiovascular;  Laterality: N/A;   ROTATOR CUFF REPAIR Left 2013   TONSILLECTOMY     TOTAL KNEE ARTHROPLASTY Right 12/19/2015   Procedure: TOTAL KNEE ARTHROPLASTY;  Surgeon: Norleen JINNY Maltos, MD;  Location: [facility];  Service: Orthopedics;  Laterality: Right;   TUBAL LIGATION     VISCERAL ANGIOGRAPHY N/A 05/23/2018   Procedure: VISCERAL ANGIOGRAPHY;  Surgeon: Marea Selinda RAMAN, MD;  Location: [facility];  Service: Cardiovascular;  Laterality: N/A;   VISCERAL ANGIOGRAPHY N/A 05/25/2022   Procedure: VISCERAL ANGIOGRAPHY;  Surgeon: Marea Selinda RAMAN,  MD;  Location: [facility];  Service: Cardiovascular;  Laterality: N/A;     Current Outpatient Medications  Medication Sig Dispense Refill   acetaminophen  (TYLENOL ) 500 MG tablet Take 1,000 mg by mouth every 6 (six) hours as needed for moderate pain or mild pain.     amLODipine  (NORVASC ) 5 MG tablet Take 1 tablet (5 mg total) by mouth 2 (two) times daily.     aspirin  81 MG tablet Take 1 tablet (81 mg total) by mouth daily. 30 tablet 0   atorvastatin  (LIPITOR) 80 MG tablet TAKE 1 TABLET BY MOUTH EVERYDAY AT BEDTIME 90 tablet 3   B-D UF III MINI PEN NEEDLES 31G X 5 MM MISC by Other route See admin instructions.     clopidogrel  (PLAVIX ) 75 MG tablet TAKE 1 TABLET BY MOUTH EVERY DAY WITH BREAKFAST 90 tablet 3   Continuous Glucose Sensor (FREESTYLE LIBRE 3 PLUS SENSOR) MISC every 14 (fourteen) days.     enalapril  (VASOTEC ) 10 MG tablet TAKE 1 TABLET BY  MOUTH TWICE A DAY 180 tablet 3   ezetimibe  (ZETIA ) 10 MG tablet Take 1 tablet (10 mg total) by mouth daily. 90 tablet 2   fluticasone  (FLONASE ) 50 MCG/ACT nasal spray Place 1-2 sprays into both nostrils daily as needed for allergies.      gabapentin  (NEURONTIN ) 300 MG capsule Take 600 mg by mouth at bedtime.     glimepiride  (AMARYL ) 2 MG tablet Take 2 mg by mouth daily with breakfast.     glucose blood (ONETOUCH ULTRA) test strip USE 2 (TWO) TIMES DAILY. USE AS INSTRUCTED.     isosorbide  mononitrate (IMDUR ) 60 MG 24 hr tablet Take 1 tablet (60 mg total) by mouth daily. 90 tablet 3   LANTUS SOLOSTAR 100 UNIT/ML Solostar Pen Inject 10 Units into the skin. (Patient taking differently: Inject 6 Units into the skin.)     levothyroxine  (SYNTHROID ) 88 MCG tablet Take by mouth.     metFORMIN  (GLUCOPHAGE ) 1000 MG tablet Take 1 tablet (1,000 mg total) by mouth 2 (two) times daily with a meal. Resume on 10/21/19.     nitroGLYCERIN  (NITROSTAT ) 0.4 MG SL tablet Place 1 tablet (0.4 mg total) under the tongue every 5 (five) minutes as needed for chest pain.  (Patient not taking: Reported on 10/29/2023) 25 tablet 3   ondansetron  (ZOFRAN -ODT) 4 MG disintegrating tablet Take 4 mg by mouth every 8 (eight) hours as needed for nausea or vomiting.     pantoprazole  (PROTONIX ) 40 MG tablet Take 40 mg by mouth daily.      Polyethyl Glycol-Propyl Glycol 0.4-0.3 % SOLN Place 1 drop into both eyes 3 (three) times daily as needed (for dry eyes).     sertraline  (ZOLOFT ) 100 MG tablet Take 100 mg by mouth daily.     tiZANidine  (ZANAFLEX ) 2 MG tablet Take 2 mg by mouth every 6 (six) hours as needed for muscle spasms.     VITAMIN D PO Take 1 tablet by mouth daily.     No current facility-administered medications for this visit.    Allergies:   Simethicone, Morphine  and codeine, and Penicillins    Social History:  The patient  reports that she has never smoked. She has never used smokeless tobacco. She reports that she does not currently use drugs after having used the following drugs: Hydrocodone . She reports that she does not drink alcohol .   Family History:  The patient's family history includes Breast cancer in her paternal grandmother; Breast cancer (age of onset: 9) in her maternal aunt; Cancer in her paternal aunt; Diabetes in her father; Heart attack in her father and mother; Heart disease in her father and mother.    ROS:  Please see the history of present illness.   Otherwise, review of systems are positive for none.   All other systems are reviewed and negative.    PHYSICAL EXAM: VS:  BP (!) 144/78 (BP Location: Left Arm, Patient Position: Sitting, Cuff Size: Large)   Pulse 60   Ht 5' (1.524 m)   Wt 153 lb 8 oz (69.6 kg)   SpO2 97%   BMI 29.98 kg/m  , BMI Body mass index is 29.98 kg/m. GEN: Well nourished, well developed, in no acute distress  HEENT: normal  Neck: no JVD, carotid bruits, or masses Cardiac: RRR; no rubs, or gallops,no edema .  2/6 systolic murmur in the aortic area. Respiratory:  clear to auscultation bilaterally, normal work of  breathing GI: soft, nontender, nondistended, + BS MS: no deformity or atrophy  Skin: warm and dry, no rash Neuro:  Strength and sensation are intact Psych: euthymic mood, full affect   EKG:  EKG is not ordered today.    Recent Labs: No results found for requested labs within last 365 days.    Lipid Panel    Component Value Date/Time   CHOL 131 10/19/2019 0416   TRIG 120 10/19/2019 0416   HDL 37 (L) 10/19/2019 0416   CHOLHDL 3.5 10/19/2019 0416   VLDL 24 10/19/2019 0416   LDLCALC 70 10/19/2019 0416   LDLDIRECT 75.0 12/11/2019 1436      Wt Readings from Last 3 Encounters:  02/01/24 153 lb 8 oz (69.6 kg)  10/29/23 152 lb 6.4 oz (69.1 kg)  09/28/23 152 lb 9.6 oz (69.2 kg)          08/10/2019    4:23 PM  PAD Screen  Previous PAD dx? No  Previous surgical procedure? No  Pain with walking? No  Feet/toe relief with dangling? No  Painful, non-healing ulcers? No  Extremities discolored? No      ASSESSMENT AND PLAN:  1. Coronary artery disease involving native coronary arteries with stable angina angina: Her symptoms are now well-controlled especially after increasing the dose of Imdur  to 60 mg once daily.  Thus, no indication for catheterization or revascularization.  2. Essential hypertension: Blood pressure is mildly elevated today.  Beta-blockers were associated with bradycardia in the past.  Continue amlodipine , enalapril  and Imdur .  3. Hyperlipidemia: Continue high-dose atorvastatin  and Zetia .  Most recent lipid profile showed an LDL of 61.  4.  History of SMA/celiac artery stent: Followed by vascular surgery.    Disposition:   FU with me in 6 months  Signed,  Deatrice Cage, MD  02/01/2024 2:52 PM     [facility]

## 2024-02-01 NOTE — Patient Instructions (Signed)

## 2024-03-13 ENCOUNTER — Other Ambulatory Visit: Payer: Self-pay

## 2024-03-14 MED ORDER — EZETIMIBE 10 MG PO TABS: 10.0000 mg | ORAL_TABLET | Freq: Every day | ORAL | 3 refills | Status: AC

## 2024-03-28 NOTE — Progress Notes (Addendum)
 Alexandria Marquez                                          MRN: [id number]   03/28/2024   The [facility] Quality Team Specialist reviewed this patient medical record for the purposes of chart review for care gap closure. The following were reviewed: chart review for care gap closure-controlling blood pressure.  Chart reviewed for KED labs as well.   [facility] Quality Team

## 2024-09-26 ENCOUNTER — Encounter (INDEPENDENT_AMBULATORY_CARE_PROVIDER_SITE_OTHER)

## 2024-09-26 ENCOUNTER — Ambulatory Visit (INDEPENDENT_AMBULATORY_CARE_PROVIDER_SITE_OTHER): Admitting: Vascular Surgery
